# Patient Record
Sex: Male | Born: 1940
Health system: Southern US, Community
[De-identification: ages and names within clinical notes are randomized; demographics above are authoritative.]

## PROBLEM LIST (undated history)

## (undated) DIAGNOSIS — I10 Essential (primary) hypertension: Secondary | ICD-10-CM

## (undated) DIAGNOSIS — E119 Type 2 diabetes mellitus without complications: Secondary | ICD-10-CM

## (undated) DIAGNOSIS — J45909 Unspecified asthma, uncomplicated: Secondary | ICD-10-CM

## (undated) DIAGNOSIS — I519 Heart disease, unspecified: Secondary | ICD-10-CM

## (undated) DIAGNOSIS — I639 Cerebral infarction, unspecified: Secondary | ICD-10-CM

## (undated) DIAGNOSIS — C61 Malignant neoplasm of prostate: Secondary | ICD-10-CM

## (undated) DIAGNOSIS — K921 Melena: Secondary | ICD-10-CM

## (undated) DIAGNOSIS — M1990 Unspecified osteoarthritis, unspecified site: Secondary | ICD-10-CM

## (undated) HISTORY — DX: Melena: K92.1

## (undated) HISTORY — DX: Essential (primary) hypertension: I10

## (undated) HISTORY — DX: Unspecified asthma, uncomplicated: J45.909

## (undated) HISTORY — DX: Unspecified osteoarthritis, unspecified site: M19.90

## (undated) HISTORY — DX: Heart disease, unspecified: I51.9

## (undated) HISTORY — PX: PROSTATE BIOPSY: SHX241

## (undated) HISTORY — DX: Malignant neoplasm of prostate: C61

## (undated) HISTORY — PX: CORONARY ANGIOPLASTY WITH STENT PLACEMENT: SHX49

## (undated) HISTORY — DX: Cerebral infarction, unspecified: I63.9

## (undated) HISTORY — PX: GANGLION CYST EXCISION: SHX1691

---

## 2019-01-09 ENCOUNTER — Other Ambulatory Visit: Payer: Self-pay | Admitting: Urology

## 2019-01-09 DIAGNOSIS — C61 Malignant neoplasm of prostate: Secondary | ICD-10-CM

## 2019-01-15 ENCOUNTER — Encounter (HOSPITAL_COMMUNITY)
Admission: RE | Admit: 2019-01-15 | Discharge: 2019-01-15 | Disposition: A | Discharge status: Home / Self Care | Payer: Medicare HMO | Source: Ambulatory Visit | Attending: Urology | Admitting: Urology

## 2019-01-15 ENCOUNTER — Ambulatory Visit (HOSPITAL_COMMUNITY)
Admission: RE | Admit: 2019-01-15 | Discharge: 2019-01-15 | Disposition: A | Discharge status: Home / Self Care | Payer: Medicare HMO | Source: Ambulatory Visit | Attending: Urology | Admitting: Urology

## 2019-01-15 ENCOUNTER — Encounter (HOSPITAL_COMMUNITY): Payer: Self-pay

## 2019-01-15 ENCOUNTER — Other Ambulatory Visit: Payer: Self-pay

## 2019-01-15 DIAGNOSIS — C61 Malignant neoplasm of prostate: Secondary | ICD-10-CM

## 2019-01-15 LAB — POCT I-STAT CREATININE: Creatinine, Ser: 1.5 mg/dL — ABNORMAL HIGH (ref 0.61–1.24)

## 2019-01-15 MED ORDER — TECHNETIUM TC 99M MEDRONATE IV KIT
21.8000 | PACK | Freq: Once | INTRAVENOUS | Status: AC | PRN
  Administered 2019-01-15: 11:00:00 21.8 via INTRAVENOUS

## 2019-01-15 MED ORDER — IOHEXOL 300 MG/ML  SOLN
100.0000 mL | Freq: Once | INTRAMUSCULAR | Status: AC | PRN
  Administered 2019-01-15: 100 mL via INTRAVENOUS

## 2019-01-24 ENCOUNTER — Ambulatory Visit (INDEPENDENT_AMBULATORY_CARE_PROVIDER_SITE_OTHER): Payer: Medicare HMO | Admitting: Family Medicine

## 2019-01-24 ENCOUNTER — Other Ambulatory Visit: Payer: Self-pay

## 2019-01-24 ENCOUNTER — Encounter: Payer: Self-pay | Admitting: Family Medicine

## 2019-01-24 DIAGNOSIS — Z0000 Encounter for general adult medical examination without abnormal findings: Secondary | ICD-10-CM | POA: Diagnosis not present

## 2019-01-24 DIAGNOSIS — I10 Essential (primary) hypertension: Secondary | ICD-10-CM | POA: Diagnosis not present

## 2019-01-24 DIAGNOSIS — G459 Transient cerebral ischemic attack, unspecified: Secondary | ICD-10-CM

## 2019-01-24 DIAGNOSIS — H3530 Unspecified macular degeneration: Secondary | ICD-10-CM

## 2019-01-24 DIAGNOSIS — I2510 Atherosclerotic heart disease of native coronary artery without angina pectoris: Secondary | ICD-10-CM | POA: Diagnosis not present

## 2019-01-24 DIAGNOSIS — E1121 Type 2 diabetes mellitus with diabetic nephropathy: Secondary | ICD-10-CM | POA: Insufficient documentation

## 2019-01-24 DIAGNOSIS — C61 Malignant neoplasm of prostate: Secondary | ICD-10-CM | POA: Diagnosis not present

## 2019-01-24 DIAGNOSIS — R9721 Rising PSA following treatment for malignant neoplasm of prostate: Secondary | ICD-10-CM | POA: Insufficient documentation

## 2019-01-24 DIAGNOSIS — E119 Type 2 diabetes mellitus without complications: Secondary | ICD-10-CM | POA: Diagnosis not present

## 2019-01-24 LAB — COMPREHENSIVE METABOLIC PANEL
ALT: 13 U/L (ref 0–53)
AST: 17 U/L (ref 0–37)
Albumin: 4.3 g/dL (ref 3.5–5.2)
Alkaline Phosphatase: 71 U/L (ref 39–117)
BUN: 26 mg/dL — ABNORMAL HIGH (ref 6–23)
CO2: 34 mEq/L — ABNORMAL HIGH (ref 19–32)
Calcium: 10 mg/dL (ref 8.4–10.5)
Chloride: 98 mEq/L (ref 96–112)
Creatinine, Ser: 1.34 mg/dL (ref 0.40–1.50)
GFR: 51.49 mL/min — ABNORMAL LOW (ref >60.00)
Glucose, Bld: 114 mg/dL — ABNORMAL HIGH (ref 70–99)
Potassium: 3.2 mEq/L — ABNORMAL LOW (ref 3.5–5.1)
Sodium: 140 mEq/L (ref 135–145)
Total Bilirubin: 0.4 mg/dL (ref 0.2–1.2)
Total Protein: 7.2 g/dL (ref 6.0–8.3)

## 2019-01-24 LAB — HEMOGLOBIN A1C: Hgb A1c MFr Bld: 7.4 % — ABNORMAL HIGH (ref 4.6–6.5)

## 2019-01-24 MED ORDER — EZETIMIBE 10 MG PO TABS: 10.0000 mg | ORAL_TABLET | Freq: Every day | ORAL | 3 refills | Status: DC

## 2019-01-24 MED ORDER — IOHEXOL 300 MG/ML  SOLN
100.0000 mL | Freq: Once | INTRAMUSCULAR | Status: AC | PRN
  Administered 2019-01-24: 100 mL via INTRAVENOUS

## 2019-01-24 NOTE — Progress Notes (Signed)
Phone: [phone]  Subjective:  Patient presents today for their  Medicare Exam and to establish care with chronic health issues.   1) HTN: Hypertension: Here for follow up of hypertension.  Currently on procardia 90mg  and hctz 25mg .  Takes medication as prescribed and denies any side effects. Exercise includes working outside. Weight has been stable. Denies any chest pain, headaches, shortness of breath, vision changes, swelling in lower extremities.   2) hx of TIA: states he has had 2 TIA and was put on plavix. Can not tolerate statins.   3) CAD s/p 2 stents: currently on CCB and plavix. Never been on Beta blocker and can not tolerate statins. No longer being seen by cardiology and was released years ago he states. He doesn't think he has any family history of heart disease. Father did die of CVA. 2 younger brothers. One died of SCD at age 35 years.   4) type 2 diabetes: was diagnosed in 2019. Last a1c was 7.1 in May with old PCP. He stopped his metformin on his own and changed his diet. No family hx of diabetes. He has his fasting and post prandial blood sugars with  Him and states since he changed his diet his sugars have been going down. Fasting blood sugars: 130-166. Post prandial: 87-115. No hypoglycemic events.   Preventive Screening-Counseling & Management  Advanced directives: yes  Smoking Status: smoked in high school Second Hand Smoking status: No smokers in home  Risk Factors Regular exercise: Walking, very active Diet: well balanced  Fall Risk: None  Fall Risk  01/24/2019  Falls in the past year? 0  Number falls in past yr: 0  Injury with Fall? 0   Opioid use history:  no long term opioids use  Cardiac risk factors:  advanced age (older than 6 for men, 12 for women) yes Hyperlipidemia yes  diabetes. yes Family History: mother (high cholesterol)   Depression Screen None. [place] 0    Office Visit from 01/24/2019 in [place]  PHQ-2 Total  Score  0       Office Visit from 01/24/2019 in [place]  PHQ-2 Total Score  0      Activities of Daily Living Independent ADLs and IADLs   Hearing Difficulties: -patient declines  Cognitive Testing Mini-cog: 5/5 No reported trouble.    Normal 3 word recall  List the Names of Other Physician/Practitioners you currently use: -Dr. Cammy Copa is his urologist at [facility] in [place], but has appointment with [facility] (Dr. Alinda Money)  Opthalmologist: Dr. Gloris Ham at [facility] [place].     There is no immunization history on file for this patient. Required Immunizations needed today   Tdap: 2018 Pneumonia shots: declines Eye exam: utd. Will continue with following with his eye doc in [place].  Cologaurd: 2019.   Screening tests- up to date Health Maintenance Due  Topic Date Due  . HEMOGLOBIN A1C  10-Jul-1941  . FOOT EXAM  07/31/1950  . OPHTHALMOLOGY EXAM  [birth year]/31/1950  . URINE MICROALBUMIN  07/31/1950  . TETANUS/TDAP  08/01/1959  . PNA vac Low Risk Adult (1 of 2 - PCV13) 07/31/2005   Review of Systems  Constitutional: Negative for chills, fever and malaise/fatigue.  HENT: Negative for hearing loss and sore throat.   Eyes: Negative for blurred vision and double vision.  Respiratory: Negative for cough, shortness of breath and wheezing.   Cardiovascular: Negative for chest pain, palpitations and leg swelling.  Gastrointestinal: Negative for abdominal pain,  blood in stool, nausea and vomiting.  Genitourinary: Negative for dysuria and hematuria.  Musculoskeletal: Negative for falls.  Skin: Negative for rash.  Neurological: Negative for dizziness and weakness.  Psychiatric/Behavioral: Negative for memory loss and suicidal ideas. The patient is not nervous/anxious and does not have insomnia.      The following were reviewed and entered/updated in epic: History reviewed. No pertinent past medical history. Patient Active Problem  List   Diagnosis Date Noted  . Hypertension, essential 01/24/2019  . TIA (transient ischemic attack) 01/24/2019  . CAD (coronary artery disease) 01/24/2019  . Prostate cancer ([place]) 01/24/2019  . Controlled type 2 diabetes mellitus without complication, without long-term current use of insulin ([place]) 01/24/2019  . Macular degeneration 01/24/2019   History reviewed. No pertinent surgical history.  History reviewed. No pertinent family history.  Medications- reviewed and updated Current Outpatient Medications  Medication Sig Dispense Refill  . clopidogrel (PLAVIX) 75 MG tablet Take by mouth.    . Coenzyme Q10 (COQ10) 200 MG CAPS Take by mouth.    . hydrochlorothiazide (HYDRODIURIL) 25 MG tablet Take by mouth.    . Melatonin 5 MG CAPS Take by mouth.    Marland Kitchen NIFEdipine (PROCARDIA XL/NIFEDICAL-XL) 90 MG 24 hr tablet     . potassium chloride (K-DUR) 10 MEQ tablet Take by mouth.    . saw palmetto 160 MG capsule Take 160 mg by mouth 2 (two) times daily.    Marland Kitchen ezetimibe (ZETIA) 10 MG tablet Take 1 tablet (10 mg total) by mouth daily. 90 tablet 3   No current facility-administered medications for this visit.     Allergies-reviewed and updated Allergies  Allergen Reactions  . Statins Other (See Comments)    Causes narcolepsy    Social History   Socioeconomic History  . Marital status: Married    Spouse name: Not on file  . Number of children: Not on file  . Years of education: Not on file  . Highest education level: Not on file  Occupational History  . Not on file  Social Needs  . Financial resource strain: Not on file  . Food insecurity:    Worry: Not on file    Inability: Not on file  . Transportation needs:    Medical: Not on file    Non-medical: Not on file  Tobacco Use  . Smoking status: Former Research scientist (life sciences)  . Smokeless tobacco: Never Used  Substance and Sexual Activity  . Alcohol use: Not on file  . Drug use: Not on file  . Sexual activity: Not on file  Lifestyle  .  Physical activity:    Days per week: Not on file    Minutes per session: Not on file  . Stress: Not on file  Relationships  . Social connections:    Talks on phone: Not on file    Gets together: Not on file    Attends religious service: Not on file    Active member of club or organization: Not on file    Attends meetings of clubs or organizations: Not on file    Relationship status: Not on file  Other Topics Concern  . Not on file  Social History Narrative  . Not on file    Objective: BP 136/84 (BP Location: Left Arm, Patient Position: Sitting, Cuff Size: Normal)   Pulse 72   Temp 97.6 F (36.4 C) (Oral)   Ht 5\' 10"  (1.778 m)   Wt 165 lb 9.6 oz (75.1 kg)   SpO2 95%  BMI 23.76 kg/m  Gen: NAD, resting comfortably HEENT: Mucous membranes are moist. Oropharynx normal Neck: no thyromegaly CV: RRR no murmurs rubs or gallops Lungs: CTAB no crackles, wheeze, rhonchi Abdomen: soft/nontender/nondistended/normal bowel sounds. No rebound or guarding.  Ext: no edema Skin: warm, dry Neuro: grossly normal, moves all extremities, PERRLA  Assessment/Plan:  Medicare exam completed- discussed recommended screenings anddocumented any personalized health advice and referrals for preventive counseling. See AVS as well which was given to patient. Requesting records from his previous PCP/labs/HM. He states he is up to date with his tdap, declines pnuemonia shots and had cologaurd last year. Reviewed labs with him from March of this year.   Status of chronic or acute concerns  1. Hypertension, essential Blood pressure is to goal. Continue current anti-hypertensive medications. Refills not needed.  lab work reviewed. Recommended routine exercise and healthy diet including DASH diet and mediterranean diet. Encouraged weight loss. F/u in 6 months. Will need all labs at that time.    2. TIA (transient ischemic attack) Continue plavix. Can not tolerate statins.   3. Coronary artery disease  involving native coronary artery of native heart without angina pectoris Requesting records. ? Why never put on beta blocker. Can not tolerate statins, so will do trial of zetia. His LDL is 167 and would like to see this closer to goal with diabetes, HTN, CAD, TIA. Discussed drug and how to take and side effects. Check liver enzymes at next visit. Continue plavix.   4. Prostate cancer ([place]) CT and bone scan with no abnormal activity. Did discuss with him, but will need to follow up with urologist for elevated PSA. This was over 13 in march. Sees urology here in a few days.   5. Controlled type 2 diabetes mellitus without complication, without long-term current use of insulin ([place]) Unsure why he stopped his metformin. Checking renal function and A1c. Sugar log is pretty well controlled for age. Discussed goal for him at near 79 years. I think he can likely do with diet. He declined nutrition consult and handouts/information was given to help with diabetic diet/information. Will f/u on A1C. Did discuss if high will have him see Sam for nutrition consult and see if medication needed. See him back in 3-6 months with labs.  - Hemoglobin A1c - Comprehensive metabolic panel  6. Macular degeneration of right eye, unspecified type Continue to follow with [place] doctor.     No future appointments. Return in about 6 months (around 07/27/2019) for routine f/u with labs.   Lab/Order associations: Hypertension, essential  TIA (transient ischemic attack)  Coronary artery disease involving native coronary artery of native heart without angina pectoris  Prostate cancer ([place])  Controlled type 2 diabetes mellitus without complication, without long-term current use of insulin ([place]) - Plan: Hemoglobin A1c, Comprehensive metabolic panel  Macular degeneration of right eye, unspecified type  Meds ordered this encounter  Medications  . ezetimibe (ZETIA) 10 MG tablet    Sig: Take 1 tablet (10 mg total) by  mouth daily.    Dispense:  90 tablet    Refill:  3    Return precautions advised. [name], MD

## 2019-01-24 NOTE — Patient Instructions (Addendum)
1) cholesterol: I sent in medication called zetia to take once/day. It's not a statin, but with your hx of TIA, stents and diabetes we want to get your LDL cholesterol down to decrease risk of heart event or stroke.   2) diabetes: checking A1c today since you stopped metformin. Recommend looking at American diabetic website for good food information.   3) blood pressure is good.   -see you in 6 months! So nice to meet you

## 2019-01-31 ENCOUNTER — Other Ambulatory Visit: Payer: Self-pay | Admitting: Family Medicine

## 2019-01-31 MED ORDER — CLOPIDOGREL BISULFATE 75 MG PO TABS: 75.0000 mg | ORAL_TABLET | Freq: Every day | ORAL | 1 refills | Status: DC

## 2019-01-31 MED ORDER — HYDROCHLOROTHIAZIDE 25 MG PO TABS: 25.0000 mg | ORAL_TABLET | Freq: Every day | ORAL | 1 refills | Status: DC

## 2019-01-31 MED ORDER — NIFEDIPINE ER OSMOTIC RELEASE 90 MG PO TB24: 90.0000 mg | ORAL_TABLET | Freq: Every day | ORAL | 1 refills | Status: DC

## 2019-01-31 MED ORDER — POTASSIUM CHLORIDE CRYS ER 10 MEQ PO TBCR: 10.0000 meq | EXTENDED_RELEASE_TABLET | Freq: Three times a day (TID) | ORAL | 1 refills | Status: DC

## 2019-01-31 NOTE — Progress Notes (Signed)
Spoke to patient and advised all rx refills sent in to pharmacy.  He verbalized understanding

## 2019-01-31 NOTE — Progress Notes (Signed)
Please let him know that I sent in all requested medication to his mail order pharmacy with 90 day supply.  Thanks for the letter  With request! Dr. Rogers Blocker

## 2019-02-07 ENCOUNTER — Encounter: Payer: Self-pay | Admitting: Physician Assistant

## 2019-02-07 ENCOUNTER — Ambulatory Visit (INDEPENDENT_AMBULATORY_CARE_PROVIDER_SITE_OTHER): Payer: Medicare HMO | Admitting: Physician Assistant

## 2019-02-07 DIAGNOSIS — Z713 Dietary counseling and surveillance: Secondary | ICD-10-CM | POA: Diagnosis not present

## 2019-02-07 DIAGNOSIS — E119 Type 2 diabetes mellitus without complications: Secondary | ICD-10-CM | POA: Diagnosis not present

## 2019-02-07 NOTE — Patient Instructions (Signed)
It was great to see you!  Here our the key points from our discussion today:  1. Measure out the honey nut cheerios and eat no more than one cup daily. Add additional protein for your breakfast (see handout for ideas.) Choose either a breakfast sandwich OR cereal.  2. Eat a good portion of NON-starchy veggies (all veggies except potatoes/corn/peas) with lunch and dinner. Fill up on this first, then work on protein. Carbohydrates last.  3. Carbohydrates (pasta/rice/pototoes/etc) -- keep the portion to the size of the computer mouse.  4. For the afternoon grazing, please work on good source of protein. See snack list for ideas.    Take care,  Inda Coke PA-C

## 2019-02-07 NOTE — Progress Notes (Signed)
Virtual Visit via Video   I connected with Wayne Perkins on 02/07/19 at 10:40 AM EDT by a video enabled telemedicine application and verified that I am speaking with the correct person using two identifiers. Location patient: Home Location provider: [facility], Office Persons participating in the virtual visit: Noelle, [place] PA-C, Anselmo Pickler, LPN   I discussed the limitations of evaluation and management by telemedicine and the availability of in person appointments. The patient expressed understanding and agreed to proceed.  I acted as a Education administrator for [employer], [employer], LPN  Subjective:   HPI:  Nutrition Counseling Pt would like to discuss nutrition and diet and healthy eating. He is diabetic and has been on medications as recently as 2-3 years ago, was on metformin. He is currently not on any medications.  Lab Results  Component Value Date   HGBA1C 7.4 (H) 01/24/2019   Dietary recall: Wakes up at [place] -- around 7-8a -- 2 cups honey nut cheerios, [employer] toast, blueberries, 2% milk Lunch -- around noon -- PB sandwich or ham/cheese with tortilla chips Dinner -- around 630p -- soups (chili/veggie) with sandwich Snacks -- nuts/chips/pretzels (grazing throughout mid afternoon) Beverages -- hot tea without sweetener, water  Takeout -- gets sprite, small frosty and Wendy's jr bacon cheeseburger twice weekly  Weight: Wt Readings from Last 3 Encounters:  01/24/19 165 lb 9.6 oz (75.1 kg)    Exercise: Works a lot around [employer] system: wife  Goals: 1- avoid DM medications  Estimated daily energy needs: Calories: 1500-1750 kcal Protein: 60-75 g Fluid: at least 1800 ml   ROS: See pertinent positives and negatives per HPI.  Patient Active Problem List   Diagnosis Date Noted  . Hypertension, essential 01/24/2019  . TIA (transient ischemic attack) 01/24/2019  . CAD (coronary artery disease) 01/24/2019  . Prostate  cancer ([place]) 01/24/2019  . Controlled type 2 diabetes mellitus without complication, without long-term current use of insulin ([place]) 01/24/2019  . Macular degeneration 01/24/2019    Social History   Tobacco Use  . Smoking status: Former Research scientist (life sciences)  . Smokeless tobacco: Never Used  Substance Use Topics  . Alcohol use: Not on file    Current Outpatient Medications:  .  aspirin EC 81 MG tablet, Take 81 mg by mouth daily., Disp: , Rfl:  .  clopidogrel (PLAVIX) 75 MG tablet, Take 1 tablet (75 mg total) by mouth daily., Disp: 90 tablet, Rfl: 1 .  ezetimibe (ZETIA) 10 MG tablet, Take 1 tablet (10 mg total) by mouth daily., Disp: 90 tablet, Rfl: 3 .  hydrochlorothiazide (HYDRODIURIL) 25 MG tablet, Take 1 tablet (25 mg total) by mouth daily., Disp: 90 tablet, Rfl: 1 .  Melatonin 5 MG CAPS, Take by mouth., Disp: , Rfl:  .  Multiple Vitamins-Minerals (PRESERVISION AREDS) CAPS, Take 1 capsule by mouth daily., Disp: , Rfl:  .  NIFEdipine (PROCARDIA XL/NIFEDICAL-XL) 90 MG 24 hr tablet, Take 1 tablet (90 mg total) by mouth daily., Disp: 90 tablet, Rfl: 1 .  potassium chloride (K-DUR) 10 MEQ tablet, Take 1 tablet (10 mEq total) by mouth 3 (three) times daily. (Patient taking differently: Take 10 mEq by mouth 2 (two) times daily. ), Disp: 270 tablet, Rfl: 1 .  saw palmetto 160 MG capsule, Take 160 mg by mouth daily. , Disp: , Rfl:   Allergies  Allergen Reactions  . Statins Other (See Comments)    Causes narcolepsy    Objective:   VITALS: Per  patient if applicable, see vitals. GENERAL: Alert, appears well and in no acute distress. HEENT: Atraumatic, conjunctiva clear, no obvious abnormalities on inspection of external nose and ears. NECK: Normal movements of the head and neck. CARDIOPULMONARY: No increased WOB. Speaking in clear sentences. I:E ratio WNL.  MS: Moves all visible extremities without noticeable abnormality. PSYCH: Pleasant and cooperative, well-groomed. Speech normal rate and rhythm.  Affect is appropriate. Insight and judgement are appropriate. Attention is focused, linear, and appropriate.  NEURO: CN grossly intact. Oriented as arrived to appointment on time with no prompting. Moves both UE equally.  SKIN: No obvious lesions, wounds, erythema, or cyanosis noted on face or hands.  Assessment and Plan:   Zacary was seen today for nutrition counseling.  Diagnoses and all orders for this visit:  Encounter for nutritional counseling; Controlled type 2 diabetes mellitus without complication, without long-term current use of insulin (H[facility]) Discussed specific, individualized recommendations regarding nutrition including decreasing sugary beverages, limiting portions, balancing out meals, and eating regularly throughout the day. Handouts provided included: Balanced Plate, Balanced Snack List, Balanced Breakfast. Provided emotional support and encouraged slow, steady weight loss. Patient's questions answered throughout encounter. Follow-up with me prn.  Specific goals: 1. Measure out the honey nut cheerios and eat no more than one cup daily. Add additional protein for your breakfast (see handout for ideas.) Choose either a breakfast sandwich OR cereal.  2. Eat a good portion of NON-starchy veggies (all veggies except potatoes/corn/peas) with lunch and dinner. Fill up on this first, then work on protein. Carbohydrates last.  3. Carbohydrates (pasta/rice/pototoes/etc) -- keep the portion to the size of the computer mouse.  4. For the afternoon grazing, please work on good source of protein. See snack list for ideas.    . Reviewed expectations re: course of current medical issues. . Discussed self-management of symptoms. . Outlined signs and symptoms indicating need for more acute intervention. . Patient verbalized understanding and all questions were answered. Marland Kitchen Health Maintenance issues including appropriate healthy diet, exercise, and smoking avoidance were discussed with  patient. . See orders for this visit as documented in the electronic medical record.  I discussed the assessment and treatment plan with the patient. The patient was provided an opportunity to ask questions and all were answered. The patient agreed with the plan and demonstrated an understanding of the instructions.   The patient was advised to call back or seek an in-person evaluation if the symptoms worsen or if the condition fails to improve as anticipated.   I spent 25 minutes with this patient, greater than 50% was face-to-face time counseling regarding the above diagnoses.  [place], [place] 02/07/2019

## 2019-04-06 LAB — URINALYSIS, COMPLETE

## 2019-04-06 LAB — NM BONE SCAN WHOLE BODY

## 2019-04-06 LAB — CHG CT ABDOMEN & PELVIS W/O CONTRST 1/> BODY RE

## 2019-04-19 ENCOUNTER — Encounter: Payer: Self-pay | Admitting: Family Medicine

## 2019-04-19 ENCOUNTER — Other Ambulatory Visit: Payer: Self-pay | Admitting: Family Medicine

## 2019-04-19 ENCOUNTER — Other Ambulatory Visit: Payer: Self-pay

## 2019-04-19 DIAGNOSIS — R9721 Rising PSA following treatment for malignant neoplasm of prostate: Secondary | ICD-10-CM

## 2019-04-20 ENCOUNTER — Telehealth: Payer: Self-pay

## 2019-04-20 NOTE — Telephone Encounter (Signed)
Spoke to patient and advised this was an error and he does not need to go for this test.  He just had one in April (per patient) Advised he may need to contact his urologist to f/u with them, we will contact radiology on our end to cancel appt.  He verbalized understanding.

## 2019-04-20 NOTE — Telephone Encounter (Signed)
Does not need. Unsure why this was ordered. Ask urology when needed.  [name], MD [place]

## 2019-04-20 NOTE — Telephone Encounter (Signed)
Copied from [place] [id number]. Topic: Quick Sport and exercise psychologist Patient (Clinic Use ONLY) >> Apr 20, 2019  1:36 PM [place], [place] wrote: Reason for CRM: Called to give him appt details for his whole body scan.  Scan will be at [facility] - just tell him to go in the main entrance and they can give him directions to radiology. He needs to be there at 10:00 for an injection and then come back for the scan at 1:00.  Nothing to eat or drink after midnight - no sugar or carbs 8 hrs prior to the scan ( he will be fasting anyways )  If he has questions or needs to reschedule, he can call [phone] >> Apr 20, 2019  3:06 PM Sheppard Coil, [employer] L wrote: Pt calling back.  He wants to know why he is being set up for this scan.

## 2019-04-30 ENCOUNTER — Encounter (HOSPITAL_COMMUNITY): Payer: Medicare HMO

## 2019-04-30 ENCOUNTER — Ambulatory Visit (HOSPITAL_COMMUNITY): Payer: Medicare HMO

## 2019-05-11 ENCOUNTER — Other Ambulatory Visit: Payer: Self-pay

## 2019-05-14 ENCOUNTER — Ambulatory Visit (INDEPENDENT_AMBULATORY_CARE_PROVIDER_SITE_OTHER): Payer: Medicare HMO | Admitting: Family Medicine

## 2019-05-14 ENCOUNTER — Encounter: Payer: Self-pay | Admitting: Family Medicine

## 2019-05-14 ENCOUNTER — Other Ambulatory Visit: Payer: Self-pay

## 2019-05-14 VITALS — BP 144/88 | HR 80 | Temp 97.6°F | Ht 70.0 in | Wt 170.6 lb

## 2019-05-14 DIAGNOSIS — R9721 Rising PSA following treatment for malignant neoplasm of prostate: Secondary | ICD-10-CM

## 2019-05-14 DIAGNOSIS — I2510 Atherosclerotic heart disease of native coronary artery without angina pectoris: Secondary | ICD-10-CM | POA: Diagnosis not present

## 2019-05-14 DIAGNOSIS — E119 Type 2 diabetes mellitus without complications: Secondary | ICD-10-CM | POA: Diagnosis not present

## 2019-05-14 DIAGNOSIS — I10 Essential (primary) hypertension: Secondary | ICD-10-CM | POA: Diagnosis not present

## 2019-05-14 DIAGNOSIS — C61 Malignant neoplasm of prostate: Secondary | ICD-10-CM

## 2019-05-14 LAB — COMPREHENSIVE METABOLIC PANEL
ALT: 16 U/L (ref 0–53)
AST: 19 U/L (ref 0–37)
Albumin: 4.4 g/dL (ref 3.5–5.2)
Alkaline Phosphatase: 70 U/L (ref 39–117)
BUN: 36 mg/dL — ABNORMAL HIGH (ref 6–23)
CO2: 33 mEq/L — ABNORMAL HIGH (ref 19–32)
Calcium: 10.4 mg/dL (ref 8.4–10.5)
Chloride: 97 mEq/L (ref 96–112)
Creatinine, Ser: 1.4 mg/dL (ref 0.40–1.50)
GFR: 48.91 mL/min — ABNORMAL LOW (ref >60.00)
Glucose, Bld: 170 mg/dL — ABNORMAL HIGH (ref 70–99)
Potassium: 3.3 mEq/L — ABNORMAL LOW (ref 3.5–5.1)
Sodium: 140 mEq/L (ref 135–145)
Total Bilirubin: 0.5 mg/dL (ref 0.2–1.2)
Total Protein: 7 g/dL (ref 6.0–8.3)

## 2019-05-14 LAB — PSA: PSA: 2.02 ng/mL (ref 0.10–4.00)

## 2019-05-14 LAB — LIPID PANEL
Cholesterol: 214 mg/dL — ABNORMAL HIGH (ref 0–200)
HDL: 66.7 mg/dL (ref >39.00)
LDL Cholesterol: 116 mg/dL — ABNORMAL HIGH (ref 0–99)
NonHDL: 147.36
Total CHOL/HDL Ratio: 3
Triglycerides: 157 mg/dL — ABNORMAL HIGH (ref 0.0–149.0)
VLDL: 31.4 mg/dL (ref 0.0–40.0)

## 2019-05-14 LAB — MICROALBUMIN / CREATININE URINE RATIO
Creatinine,U: 35.7 mg/dL
Microalb Creat Ratio: 3.6 mg/g (ref 0.0–30.0)
Microalb, Ur: 1.3 mg/dL (ref 0.0–1.9)

## 2019-05-14 LAB — HEMOGLOBIN A1C: Hgb A1c MFr Bld: 8.1 % — ABNORMAL HIGH (ref 4.6–6.5)

## 2019-05-14 NOTE — Progress Notes (Signed)
Patient: Wayne Perkins MRN: [id number] DOB: 1940-11-11 PCP: [name], MD     Subjective:  Chief Complaint  Patient presents with  . follow up-labs  . Diabetes    HPI: The patient is a 78 y.o. male who presents today for follow up on labs from 01/24/19.  Started on zetia on 5/27 also.  Diabetes: Patient is here for follow up of type 2 diabetes. First diagnosed 2019.  Currently on the following medications none. He is currently on no medication for diabetes. He stopped this on his own accord. His last A1C was 7.4.  Currently exercising and following diabetic diet. No hypoglycemia. Denies any hypoglycemic events. Denies any vision changes, nausea, vomiting, abdominal pain, ulcers/paraesthesia in feet, polyuria, polydipsia or polyphagia. Denies any chest pain, shortness of breath. I did have him meet with nutritionist and he has implemented some of her dietary recommendations.   CAD/heart disease and TIA: we started zetia on him at last visit. He can not tolerate statins. Still unsure why not on beta blocker and may add this if BP elevated on next visit for cardio protection. Requesting records again. Tolerating zetia with no issues and taking as directed.   He would like his psa checked as well. Has f/u with urology in October.    Review of Systems  Constitutional: Negative for fatigue.  HENT: Negative for congestion, postnasal drip, rhinorrhea and sore throat.   Eyes: Negative for visual disturbance.  Respiratory: Negative for cough and shortness of breath.   Cardiovascular: Negative for chest pain, palpitations and leg swelling.  Gastrointestinal: Negative for abdominal pain, diarrhea, nausea and vomiting.  Endocrine: Negative for polydipsia and polyuria.  Musculoskeletal: Positive for neck pain. Negative for arthralgias, back pain and myalgias.  Skin: Negative for rash.  Neurological: Negative for dizziness and headaches.  Psychiatric/Behavioral: Negative for sleep disturbance.     Allergies Patient is allergic to statins.  Past Medical History Patient  has a past medical history of Arthritis, Blood in stool, Childhood asthma, Heart disease, Hypertension, essential, Prostate cancer ([place]), and Stroke ([place]).  Surgical History Patient  has no past surgical history on file.  Family History [name]'s family history includes Depression in his brother; Early death in his brother; Heart disease in his brother; Hyperlipidemia in his mother and sister; Hypertension in his mother; Mental illness in his brother; Stroke in his father.  Social History Patient  reports that he has quit smoking. He has never used smokeless tobacco. He reports previous alcohol use. He reports that he does not use drugs.    Objective: Vitals:   05/14/19 1035 05/14/19 1111  BP: (!) 144/85 (!) 144/88  Pulse: 80   Temp: 97.6 F (36.4 C)   TempSrc: Skin   SpO2: 98%   Weight: 170 lb 9.6 oz (77.4 kg)   Height: 5\' 10"  (1.778 m)     Body mass index is 24.48 kg/m.  Physical Exam Vitals signs reviewed.  Constitutional:      Appearance: Normal appearance. He is normal weight.  HENT:     Head: Normocephalic and atraumatic.  Eyes:     Extraocular Movements: Extraocular movements intact.     Conjunctiva/sclera: Conjunctivae normal.     Pupils: Pupils are equal, round, and reactive to light.  Cardiovascular:     Rate and Rhythm: Normal rate and regular rhythm.     Heart sounds: No murmur.  Pulmonary:     Effort: Pulmonary effort is normal.  Abdominal:     General: Abdomen is flat.  Bowel sounds are normal.     Palpations: Abdomen is soft.  Skin:    General: Skin is warm and dry.  Neurological:     General: No focal deficit present.     Mental Status: He is alert and oriented to person, place, and time.  Psychiatric:        Mood and Affect: Mood normal.        Behavior: Behavior normal.        Assessment/plan: 1. Controlled type 2 diabetes mellitus without complication, without  long-term current use of insulin ([place]) Has seen nutrition. Checking a1c today. Requesting his eye exam and again, requesting records from previous pcp. Declines flu and pneumonia shot. Needs foot exam at next appointment. Can not tolerate statin, so I have him on zetia that he is tolerating well. F/u in 3 months.  - Hemoglobin A1c - Microalbumin / creatinine urine ratio  2. Hypertension, essential Above goal on recheck. He has CAD with 2 stents. No beta blocker. Requesting records from cardiology. Beta blocker vs. acei would be good choice for him to get to goal. He states his home readings are to goal and will bring to next visit.   3. Biochemically recurrent malignant neoplasm of prostate Will mail results to have at his urologist.  - PSA  4. Coronary artery disease involving native coronary artery of native heart without angina pectoris Requesting records to be sent from cardiologist. On zetia and tolerating well. Checking lipid panel again today. Continue current therapy and maximize diabetic and blood pressure control.  - Comprehensive metabolic panel - Lipid panel   Return if symptoms worsen or fail to improve, for already has f/u in 3 months. [name], MD [place]   05/14/2019

## 2019-05-16 ENCOUNTER — Ambulatory Visit: Payer: Medicare HMO | Admitting: Family Medicine

## 2019-05-21 ENCOUNTER — Ambulatory Visit (INDEPENDENT_AMBULATORY_CARE_PROVIDER_SITE_OTHER): Payer: Medicare HMO | Admitting: Family Medicine

## 2019-05-21 ENCOUNTER — Encounter: Payer: Self-pay | Admitting: Family Medicine

## 2019-05-21 VITALS — BP 138/79 | Ht 70.0 in | Wt 170.0 lb

## 2019-05-21 DIAGNOSIS — E119 Type 2 diabetes mellitus without complications: Secondary | ICD-10-CM

## 2019-05-21 DIAGNOSIS — E876 Hypokalemia: Secondary | ICD-10-CM

## 2019-05-21 DIAGNOSIS — R799 Abnormal finding of blood chemistry, unspecified: Secondary | ICD-10-CM

## 2019-05-21 MED ORDER — JARDIANCE 25 MG PO TABS: 25.0000 mg | ORAL_TABLET | Freq: Every day | ORAL | 1 refills | Status: DC

## 2019-05-21 NOTE — Progress Notes (Signed)
Patient: Wayne Perkins MRN: [id number] DOB: 1941/07/11 PCP: [name], MD     I connected with Anson [place] on 05/21/19 at 9:45am by a video enabled telemedicine application and verified that I am speaking with the correct person using two identifiers.  Location patient: Home Location provider: [facility], Office Persons participating in this virtual visit: Anson [place] and Dr. Rogers Blocker   I discussed the limitations of evaluation and management by telemedicine and the availability of in person appointments. The patient expressed understanding and agreed to proceed.   Interactive audio and video telecommunications were attempted between this provider and patient, however failed, due to patient having technical difficulties OR patient did not have access to video capability.  We continued and completed visit with audio only.   Subjective:  Chief Complaint  Patient presents with  . discuss lab results  . Diabetes    HPI: The patient is a 78 y.o. male who presents today for treatment options based on recent lab results for his diabetes.   Diabetes: Patient is here for follow up of type 2 diabetes. First diagnosed 2019.  Currently on the following medications none. He stopped his metformin on his own accord. Last A1C was 8.1. Currently exercising and NOT following diabetic diet.  Denies any hypoglycemic events. Denies any vision changes, nausea, vomiting, abdominal pain, ulcers/paraesthesia in feet, polyuria, polydipsia or polyphagia. Denies any chest pain, shortness of breath. We are here to discuss medication and treatment plan to get his diabetes under control. Also his sugar could be worse as he is on a hormone shot every 6 months for his prostate cancer. He would rather have quality of life over quantity of life.   Hypokalemia: potassium is 3.3 and is chronically low. He is on potassium replacement. He takes 37meq BID.   Elevated BUN: slowly has creeped up. Does not use Nsaids on daily  basis. He is on baby aspirin daily and plavix. Put on DPT as he had TIA while on ASA. No dark stool, no blood in stool. Drinking lots of water per patient.   Review of Systems  Constitutional: Negative for chills, fatigue and fever.  HENT: Negative for congestion, postnasal drip, rhinorrhea and sore throat.   Eyes: Negative for visual disturbance.  Respiratory: Negative for cough and shortness of breath.   Cardiovascular: Negative for chest pain, palpitations and leg swelling.  Gastrointestinal: Negative for abdominal pain, diarrhea, nausea and vomiting.  Endocrine: Negative for polydipsia and polyuria.  Musculoskeletal: Negative for arthralgias, back pain, myalgias and neck pain.  Skin: Negative for rash.  Neurological: Negative for dizziness and headaches.  Psychiatric/Behavioral: Negative for sleep disturbance.    Allergies Patient is allergic to statins.  Past Medical History Patient  has a past medical history of Arthritis, Blood in stool, Childhood asthma, Heart disease, Hypertension, essential, Prostate cancer ([place]), and Stroke ([place]).  Surgical History Patient  has no past surgical history on file.  Family History [name]'s family history includes Depression in his brother; Early death in his brother; Heart disease in his brother; Hyperlipidemia in his mother and sister; Hypertension in his mother; Mental illness in his brother; Stroke in his father.  Social History Patient  reports that he has quit smoking. He has never used smokeless tobacco. He reports previous alcohol use. He reports that he does not use drugs.    Objective: Vitals:   05/21/19 0905  BP: 138/79  Weight: 170 lb (77.1 kg)  Height: 5\' 10"  (1.778 m)    Body mass index  is 24.39 kg/m.     Assessment/plan: 1. Controlled type 2 diabetes mellitus without complication, without long-term current use of insulin ([place]) Not controlled anymore. a1c up to 8.1. the hormone shot could be playing affect on this. He  again states his diet is much improved. Regardless, discussed this is way above goal for him especially with his heart history. I do understand that he does not want to take more medicaiton, but I have given him over  6 months to work on diet alone and it did not work. He is only increasing his risk of heart attack, stroke, renal damage etc with uncontrolled blood sugar. He is on board to start medication. We are going to do jardiance with heart hx. Do not want to do metformin at this point with hx of elevated creatinine in the past. GFR okay with jardiance, but will monitor closely. Again, discussed if comes off hormones we can see if his sugar adjusts and may be able to wean off medication, but at this point I feel like he really needs this. F/u in one month for labs and keep routine appointment in office in 3 months that he already has scheduled.   2. Hypokalemia Does not want to take 3 potassium pills. Again, discussed he would rather have quality of life over taking pills and being a "druggie." discussed his potassium is borderline low and can continue with 2 pills/day. Close f/u with repeat potassium in one month. Discussed if his number drops more we will need to start more replacement as low potassium can cause deadly arrhythmias.   3. Elevated BUN DPT, needs ASA. Drink water, denies black tarry stool. F/u with repeat GFR/renal check in one month especially with addition of jardiance.      Return in about 1 month (around 06/20/2019) for labs 1 month .   [name], MD [place]   05/21/2019

## 2019-06-25 ENCOUNTER — Other Ambulatory Visit (INDEPENDENT_AMBULATORY_CARE_PROVIDER_SITE_OTHER): Payer: Medicare HMO

## 2019-06-25 ENCOUNTER — Other Ambulatory Visit: Payer: Self-pay

## 2019-06-25 DIAGNOSIS — R799 Abnormal finding of blood chemistry, unspecified: Secondary | ICD-10-CM | POA: Diagnosis not present

## 2019-06-25 DIAGNOSIS — E876 Hypokalemia: Secondary | ICD-10-CM

## 2019-06-25 LAB — COMPREHENSIVE METABOLIC PANEL
ALT: 13 U/L (ref 0–53)
AST: 18 U/L (ref 0–37)
Albumin: 4.1 g/dL (ref 3.5–5.2)
Alkaline Phosphatase: 58 U/L (ref 39–117)
BUN: 34 mg/dL — ABNORMAL HIGH (ref 6–23)
CO2: 31 mEq/L (ref 19–32)
Calcium: 9.7 mg/dL (ref 8.4–10.5)
Chloride: 98 mEq/L (ref 96–112)
Creatinine, Ser: 1.43 mg/dL (ref 0.40–1.50)
GFR: 47.72 mL/min — ABNORMAL LOW (ref >60.00)
Glucose, Bld: 274 mg/dL — ABNORMAL HIGH (ref 70–99)
Potassium: 3.2 mEq/L — ABNORMAL LOW (ref 3.5–5.1)
Sodium: 139 mEq/L (ref 135–145)
Total Bilirubin: 0.6 mg/dL (ref 0.2–1.2)
Total Protein: 6.5 g/dL (ref 6.0–8.3)

## 2019-06-29 ENCOUNTER — Other Ambulatory Visit: Payer: Self-pay

## 2019-06-29 ENCOUNTER — Other Ambulatory Visit: Payer: Self-pay | Admitting: Family Medicine

## 2019-06-29 MED ORDER — POTASSIUM CHLORIDE CRYS ER 20 MEQ PO TBCR: 20.0000 meq | EXTENDED_RELEASE_TABLET | Freq: Two times a day (BID) | ORAL | 0 refills | Status: DC

## 2019-07-02 ENCOUNTER — Telehealth: Payer: Self-pay | Admitting: Family Medicine

## 2019-07-02 NOTE — Telephone Encounter (Signed)
Spoke to patient and advised of notes per Dr. Rogers Blocker.  He verbalized understanding.

## 2019-07-02 NOTE — Telephone Encounter (Signed)
Please have him take 1 full potassium pill in the AM (I increased dose to 39meq) and then half a tab in the Pm. This will put him at 52meq which is what he was originally supposed to be on. Then we can make sure im not giving him too much potassium at his next lab check.   He thought I was changing too much by increasing his potassium to this, so we can back down to 1 pill in the AM and half in the PM.   [name], MD [place]

## 2019-07-31 ENCOUNTER — Inpatient Hospital Stay (HOSPITAL_COMMUNITY)
Admission: EM | Admit: 2019-07-31 | Discharge: 2019-08-01 | DRG: 065 | Disposition: A | Discharge status: Home / Self Care | Payer: Medicare HMO | Attending: Internal Medicine | Admitting: Internal Medicine

## 2019-07-31 ENCOUNTER — Inpatient Hospital Stay (HOSPITAL_COMMUNITY): Payer: Medicare HMO

## 2019-07-31 ENCOUNTER — Encounter (HOSPITAL_COMMUNITY): Payer: Self-pay | Admitting: *Deleted

## 2019-07-31 ENCOUNTER — Emergency Department (HOSPITAL_COMMUNITY): Payer: Medicare HMO

## 2019-07-31 ENCOUNTER — Other Ambulatory Visit: Payer: Self-pay

## 2019-07-31 DIAGNOSIS — I6381 Other cerebral infarction due to occlusion or stenosis of small artery: Secondary | ICD-10-CM | POA: Diagnosis present

## 2019-07-31 DIAGNOSIS — Z8673 Personal history of transient ischemic attack (TIA), and cerebral infarction without residual deficits: Secondary | ICD-10-CM | POA: Diagnosis not present

## 2019-07-31 DIAGNOSIS — I639 Cerebral infarction, unspecified: Secondary | ICD-10-CM | POA: Diagnosis present

## 2019-07-31 DIAGNOSIS — I2510 Atherosclerotic heart disease of native coronary artery without angina pectoris: Secondary | ICD-10-CM | POA: Diagnosis present

## 2019-07-31 DIAGNOSIS — Z8349 Family history of other endocrine, nutritional and metabolic diseases: Secondary | ICD-10-CM

## 2019-07-31 DIAGNOSIS — E1121 Type 2 diabetes mellitus with diabetic nephropathy: Secondary | ICD-10-CM

## 2019-07-31 DIAGNOSIS — Z955 Presence of coronary angioplasty implant and graft: Secondary | ICD-10-CM | POA: Diagnosis not present

## 2019-07-31 DIAGNOSIS — Z823 Family history of stroke: Secondary | ICD-10-CM

## 2019-07-31 DIAGNOSIS — I129 Hypertensive chronic kidney disease with stage 1 through stage 4 chronic kidney disease, or unspecified chronic kidney disease: Secondary | ICD-10-CM | POA: Diagnosis present

## 2019-07-31 DIAGNOSIS — E876 Hypokalemia: Secondary | ICD-10-CM | POA: Diagnosis present

## 2019-07-31 DIAGNOSIS — Z7902 Long term (current) use of antithrombotics/antiplatelets: Secondary | ICD-10-CM

## 2019-07-31 DIAGNOSIS — R29701 NIHSS score 1: Secondary | ICD-10-CM | POA: Diagnosis present

## 2019-07-31 DIAGNOSIS — Z7982 Long term (current) use of aspirin: Secondary | ICD-10-CM

## 2019-07-31 DIAGNOSIS — N1831 Chronic kidney disease, stage 3a: Secondary | ICD-10-CM | POA: Diagnosis present

## 2019-07-31 DIAGNOSIS — E1122 Type 2 diabetes mellitus with diabetic chronic kidney disease: Secondary | ICD-10-CM | POA: Diagnosis present

## 2019-07-31 DIAGNOSIS — H3530 Unspecified macular degeneration: Secondary | ICD-10-CM | POA: Diagnosis present

## 2019-07-31 DIAGNOSIS — I361 Nonrheumatic tricuspid (valve) insufficiency: Secondary | ICD-10-CM | POA: Diagnosis not present

## 2019-07-31 DIAGNOSIS — I340 Nonrheumatic mitral (valve) insufficiency: Secondary | ICD-10-CM | POA: Diagnosis not present

## 2019-07-31 DIAGNOSIS — Z888 Allergy status to other drugs, medicaments and biological substances status: Secondary | ICD-10-CM | POA: Diagnosis not present

## 2019-07-31 DIAGNOSIS — E1165 Type 2 diabetes mellitus with hyperglycemia: Secondary | ICD-10-CM | POA: Diagnosis not present

## 2019-07-31 DIAGNOSIS — C7951 Secondary malignant neoplasm of bone: Secondary | ICD-10-CM | POA: Diagnosis present

## 2019-07-31 DIAGNOSIS — C61 Malignant neoplasm of prostate: Secondary | ICD-10-CM | POA: Diagnosis not present

## 2019-07-31 DIAGNOSIS — Z87891 Personal history of nicotine dependence: Secondary | ICD-10-CM

## 2019-07-31 DIAGNOSIS — Z818 Family history of other mental and behavioral disorders: Secondary | ICD-10-CM | POA: Diagnosis not present

## 2019-07-31 DIAGNOSIS — I10 Essential (primary) hypertension: Secondary | ICD-10-CM

## 2019-07-31 DIAGNOSIS — E785 Hyperlipidemia, unspecified: Secondary | ICD-10-CM | POA: Diagnosis present

## 2019-07-31 DIAGNOSIS — Z8249 Family history of ischemic heart disease and other diseases of the circulatory system: Secondary | ICD-10-CM | POA: Diagnosis not present

## 2019-07-31 DIAGNOSIS — Z20828 Contact with and (suspected) exposure to other viral communicable diseases: Secondary | ICD-10-CM | POA: Diagnosis present

## 2019-07-31 LAB — COMPREHENSIVE METABOLIC PANEL
ALT: 20 U/L (ref 0–44)
AST: 21 U/L (ref 15–41)
Albumin: 4.1 g/dL (ref 3.5–5.0)
Alkaline Phosphatase: 59 U/L (ref 38–126)
Anion gap: 10 (ref 5–15)
BUN: 40 mg/dL — ABNORMAL HIGH (ref 8–23)
CO2: 28 mmol/L (ref 22–32)
Calcium: 9.6 mg/dL (ref 8.9–10.3)
Chloride: 100 mmol/L (ref 98–111)
Creatinine, Ser: 1.38 mg/dL — ABNORMAL HIGH (ref 0.61–1.24)
GFR calc Af Amer: 56 mL/min — ABNORMAL LOW (ref >60)
GFR calc non Af Amer: 49 mL/min — ABNORMAL LOW (ref >60)
Glucose, Bld: 233 mg/dL — ABNORMAL HIGH (ref 70–99)
Potassium: 3 mmol/L — ABNORMAL LOW (ref 3.5–5.1)
Sodium: 138 mmol/L (ref 135–145)
Total Bilirubin: 0.4 mg/dL (ref 0.3–1.2)
Total Protein: 7.2 g/dL (ref 6.5–8.1)

## 2019-07-31 LAB — GLUCOSE, CAPILLARY
Glucose-Capillary: 187 mg/dL — ABNORMAL HIGH (ref 70–99)
Glucose-Capillary: 178 mg/dL — ABNORMAL HIGH (ref 70–99)
Glucose-Capillary: 211 mg/dL — ABNORMAL HIGH (ref 70–99)

## 2019-07-31 LAB — CBC
HCT: 36.6 % — ABNORMAL LOW (ref 39.0–52.0)
Hemoglobin: 12.4 g/dL — ABNORMAL LOW (ref 13.0–17.0)
MCH: 32.8 pg (ref 26.0–34.0)
MCHC: 33.9 g/dL (ref 30.0–36.0)
MCV: 96.8 fL (ref 80.0–100.0)
Platelets: 255 10*3/uL (ref 150–400)
RBC: 3.78 MIL/uL — ABNORMAL LOW (ref 4.22–5.81)
RDW: 12.8 % (ref 11.5–15.5)
WBC: 5.7 10*3/uL (ref 4.0–10.5)
nRBC: 0 % (ref 0.0–0.2)

## 2019-07-31 LAB — MAGNESIUM: Magnesium: 1.8 mg/dL (ref 1.7–2.4)

## 2019-07-31 LAB — HEMOGLOBIN A1C
Hgb A1c MFr Bld: 8.9 % — ABNORMAL HIGH (ref 4.8–5.6)
Mean Plasma Glucose: 208.73 mg/dL

## 2019-07-31 LAB — TSH: TSH: 2.18 u[IU]/mL (ref 0.350–4.500)

## 2019-07-31 LAB — VITAMIN B12: Vitamin B-12: 873 pg/mL (ref 180–914)

## 2019-07-31 LAB — SARS CORONAVIRUS 2 (TAT 6-24 HRS): SARS Coronavirus 2: NEGATIVE

## 2019-07-31 MED ORDER — INSULIN ASPART 100 UNIT/ML ~~LOC~~ SOLN: 0.0000 [IU] | Freq: Every day | SUBCUTANEOUS | Status: DC

## 2019-07-31 MED ORDER — POTASSIUM CHLORIDE CRYS ER 20 MEQ PO TBCR
20.0000 meq | EXTENDED_RELEASE_TABLET | Freq: Two times a day (BID) | ORAL | Status: DC
  Administered 2019-07-31 – 2019-08-01 (×3): 20 meq via ORAL
  Filled 2019-07-31 (×3): qty 1

## 2019-07-31 MED ORDER — SODIUM CHLORIDE 0.9 % IV SOLN: INTRAVENOUS | Status: DC

## 2019-07-31 MED ORDER — POTASSIUM CHLORIDE CRYS ER 20 MEQ PO TBCR
40.0000 meq | EXTENDED_RELEASE_TABLET | Freq: Once | ORAL | Status: AC
  Administered 2019-07-31: 40 meq via ORAL
  Filled 2019-07-31: qty 2

## 2019-07-31 MED ORDER — HYDRALAZINE HCL 20 MG/ML IJ SOLN: 10.0000 mg | Freq: Three times a day (TID) | INTRAMUSCULAR | Status: DC | PRN

## 2019-07-31 MED ORDER — NIFEDIPINE ER OSMOTIC RELEASE 30 MG PO TB24
90.0000 mg | ORAL_TABLET | Freq: Every day | ORAL | Status: DC
  Administered 2019-07-31 – 2019-08-01 (×2): 90 mg via ORAL
  Filled 2019-07-31 (×2): qty 3

## 2019-07-31 MED ORDER — HEPARIN SODIUM (PORCINE) 5000 UNIT/ML IJ SOLN
5000.0000 [IU] | Freq: Three times a day (TID) | INTRAMUSCULAR | Status: DC
  Administered 2019-07-31 – 2019-08-01 (×4): 5000 [IU] via SUBCUTANEOUS
  Filled 2019-07-31 (×4): qty 1

## 2019-07-31 MED ORDER — INSULIN ASPART 100 UNIT/ML ~~LOC~~ SOLN: 0.0000 [IU] | Freq: Three times a day (TID) | SUBCUTANEOUS | Status: DC

## 2019-07-31 MED ORDER — OCUVITE-LUTEIN PO CAPS
1.0000 | ORAL_CAPSULE | Freq: Every day | ORAL | Status: DC
  Administered 2019-07-31 – 2019-08-01 (×2): 1 via ORAL
  Filled 2019-07-31 (×2): qty 1

## 2019-07-31 MED ORDER — ACETAMINOPHEN 160 MG/5ML PO SOLN: 650.0000 mg | ORAL | Status: DC | PRN

## 2019-07-31 MED ORDER — INSULIN DETEMIR 100 UNIT/ML ~~LOC~~ SOLN
7.0000 [IU] | Freq: Every day | SUBCUTANEOUS | Status: DC
  Filled 2019-07-31 (×2): qty 0.07

## 2019-07-31 MED ORDER — EZETIMIBE 10 MG PO TABS
10.0000 mg | ORAL_TABLET | Freq: Every day | ORAL | Status: DC
  Administered 2019-07-31 – 2019-08-01 (×2): 10 mg via ORAL
  Filled 2019-07-31 (×2): qty 1

## 2019-07-31 MED ORDER — STROKE: EARLY STAGES OF RECOVERY BOOK
Freq: Once | Status: AC
  Administered 2019-07-31: 18:00:00
  Filled 2019-07-31: qty 1

## 2019-07-31 MED ORDER — ACETAMINOPHEN 650 MG RE SUPP: 650.0000 mg | RECTAL | Status: DC | PRN

## 2019-07-31 MED ORDER — ACETAMINOPHEN 325 MG PO TABS: 650.0000 mg | ORAL_TABLET | ORAL | Status: DC | PRN

## 2019-07-31 MED ORDER — ASPIRIN 300 MG RE SUPP: 300.0000 mg | Freq: Every day | RECTAL | Status: DC

## 2019-07-31 MED ORDER — SENNOSIDES-DOCUSATE SODIUM 8.6-50 MG PO TABS
1.0000 | ORAL_TABLET | Freq: Every evening | ORAL | Status: DC | PRN
  Filled 2019-07-31: qty 1

## 2019-07-31 MED ORDER — ASPIRIN 325 MG PO TABS
325.0000 mg | ORAL_TABLET | Freq: Every day | ORAL | Status: DC
  Administered 2019-07-31 – 2019-08-01 (×2): 325 mg via ORAL
  Filled 2019-07-31 (×2): qty 1

## 2019-07-31 NOTE — Plan of Care (Signed)
  Problem: Education: Goal: Knowledge of General Education information will improve Description: Including pain rating scale, medication(s)/side effects and non-pharmacologic comfort measures Outcome: Progressing   Problem: Clinical Measurements: Goal: Ability to maintain clinical measurements within normal limits will improve Outcome: Progressing   Problem: Activity: Goal: Risk for activity intolerance will decrease Outcome: Progressing   Problem: Elimination: Goal: Will not experience complications related to urinary retention Outcome: Progressing

## 2019-07-31 NOTE — Evaluation (Signed)
Speech Language Pathology Evaluation Patient Details Name: Wayne Perkins MRN: [id number] DOB: 28-Mar-1941 Today's Date: 07/31/2019 Time: [id number] SLP Time Calculation (min) (ACUTE ONLY): 21 min  Problem List:  Patient Active Problem List   Diagnosis Date Noted  . Acute ischemic stroke ([place]) 07/31/2019  . Hypertension, essential 01/24/2019  . TIA (transient ischemic attack) 01/24/2019  . CAD (coronary artery disease) 01/24/2019  . Biochemically recurrent malignant neoplasm of prostate 01/24/2019  . Controlled type 2 diabetes mellitus without complication, without long-term current use of insulin ([place]) 01/24/2019  . Macular degeneration 01/24/2019   Past Medical History:  Past Medical History:  Diagnosis Date  . Arthritis   . Blood in stool   . Childhood asthma   . Heart disease   . Hypertension, essential   . Prostate cancer ([place])   . Stroke [facility])    Past Surgical History:  Past Surgical History:  Procedure Laterality Date  . CORONARY ANGIOPLASTY WITH STENT PLACEMENT    . GANGLION CYST EXCISION Right    HPI:  Wayne Perkins is a 78 yo male Patient c/o left leg and arm numbness for the past day. Patient states yesterday, around noon, he noticed left leg numbness. Symptoms mild-mod, persistent, constant, acute onset. Symptoms have persisted since. Last night, also noted left arm numbness. Denies loss of normal function. No neck or radicular pain. No headache. No change in speech or vision. No weakness. No problems w balance/gait. Denies chest pain or discomfort. No sob. No arm or leg swelling. No fever or chills. MRI shows: 8 mm acute lacunar infarct within the right thalamus   Assessment / Plan / Recommendation Clinical Impression  SLE completed at bedside. Pt presents with baseline mild dysarthria from previous stroke and normal cognitive linguistic functioning otherwise. Pt without changes in swallowing, speech, language, or cognition. His wife was present during the evaluation and  agreed that Pt is at his baseline. No further SLP services indicated at this time.     SLP Assessment  SLP Recommendation/Assessment: Patient does not need any further Speech Lanaguage Pathology Services SLP Visit Diagnosis: Cognitive communication deficit (R41.841)    Follow Up Recommendations  None    Frequency and Duration           SLP Evaluation Cognition  Overall Cognitive Status: Within Functional Limits for tasks assessed Arousal/Alertness: Awake/alert Orientation Level: Oriented X4 Memory: Appears intact Awareness: Appears intact Problem Solving: Appears intact Safety/Judgment: Appears intact       Comprehension  Auditory Comprehension Overall Auditory Comprehension: Appears within functional limits for tasks assessed Yes/No Questions: Within Functional Limits Commands: Within Functional Limits Conversation: Complex Visual Recognition/Discrimination Discrimination: Within Function Limits Reading Comprehension Reading Status: Within funtional limits    Expression Expression Primary Mode of Expression: Verbal Verbal Expression Overall Verbal Expression: Appears within functional limits for tasks assessed Initiation: No impairment Automatic Speech: Name;Social Response;Month of year Level of Generative/Spontaneous Verbalization: Conversation Repetition: No impairment Naming: No impairment Pragmatics: No impairment Non-Verbal Means of Communication: Not applicable Written Expression Dominant Hand: Right Written Expression: Not tested   Oral / Motor  Oral Motor/Sensory Function Overall Oral Motor/Sensory Function: Within functional limits Motor Speech Overall Motor Speech: Appears within functional limits for tasks assessed Respiration: Within functional limits Phonation: Normal Resonance: Within functional limits Articulation: Within functional limitis Intelligibility: Intelligible Motor Planning: Witnin functional limits Motor Speech Errors: Not  applicable Interfering Components: (very mild dysarthria from previous stroke)   Thank you,  Genene Churn, [place]  Juno Bozard 07/31/2019, 1:24 PM

## 2019-07-31 NOTE — ED Provider Notes (Addendum)
[facility] EMERGENCY DEPARTMENT Provider Note   CSN: [id number] Arrival date & time: 07/31/19  0206     History   Chief Complaint Chief Complaint  Patient presents with  . Numbness    HPI Wayne Perkins is a 78 y.o. male.     Patient c/o left leg and arm numbness for the past day. Patient states yesterday, around noon, he noticed left leg numbness. Symptoms mild-mod, persistent, constant, acute onset. Symptoms have persisted since. Last night, also noted left arm numbness. Denies loss of normal function. No neck or radicular pain. No headache. No change in speech or vision. No weakness. No problems w balance/gait. Denies chest pain or discomfort. No sob. No arm or leg swelling. No fever or chills.   The history is provided by the patient.    Past Medical History:  Diagnosis Date  . Arthritis   . Blood in stool   . Childhood asthma   . Heart disease   . Hypertension, essential   . Prostate cancer ([place])   . Stroke [facility])     Patient Active Problem List   Diagnosis Date Noted  . Hypertension, essential 01/24/2019  . TIA (transient ischemic attack) 01/24/2019  . CAD (coronary artery disease) 01/24/2019  . Biochemically recurrent malignant neoplasm of prostate 01/24/2019  . Controlled type 2 diabetes mellitus without complication, without long-term current use of insulin ([place]) 01/24/2019  . Macular degeneration 01/24/2019    Past Surgical History:  Procedure Laterality Date  . CORONARY ANGIOPLASTY WITH STENT PLACEMENT    . GANGLION CYST EXCISION Right         Home Medications    Prior to Admission medications   Medication Sig Start Date End Date Taking? Authorizing Provider  aspirin EC 81 MG tablet Take 81 mg by mouth daily.    [provider]  clopidogrel (PLAVIX) 75 MG tablet Take 1 tablet (75 mg total) by mouth daily. 01/31/19   [name], MD  empagliflozin (JARDIANCE) 25 MG TABS tablet Take 25 mg by mouth daily before breakfast. 05/21/19   [name], MD  ezetimibe (ZETIA) 10 MG tablet Take 1 tablet (10 mg total) by mouth daily. 01/24/19   [name], MD  hydrochlorothiazide (HYDRODIURIL) 25 MG tablet Take 1 tablet (25 mg total) by mouth daily. 01/31/19   [name], MD  Multiple Vitamins-Minerals (PRESERVISION AREDS) CAPS Take 1 capsule by mouth daily.    [provider]  NIFEdipine (PROCARDIA XL/NIFEDICAL-XL) 90 MG 24 hr tablet Take 1 tablet (90 mg total) by mouth daily. 01/31/19   [name], MD  potassium chloride (KLOR-CON) 20 MEQ tablet Take 1 tablet (20 mEq total) by mouth 2 (two) times daily. 06/29/19   [name], MD  saw palmetto 160 MG capsule Take 160 mg by mouth daily.     [provider]    Family History Family History  Problem Relation Age of Onset  . Hyperlipidemia Mother   . Hypertension Mother   . Stroke Father   . Hyperlipidemia Sister   . Depression Brother   . Early death Brother   . Heart disease Brother   . Mental illness Brother     Social History Social History   Tobacco Use  . Smoking status: Former Research scientist (life sciences)  . Smokeless tobacco: Never Used  Substance Use Topics  . Alcohol use: Not Currently  . Drug use: Never     Allergies   Statins   Review of Systems Review of Systems  Constitutional: Negative for  fever.  HENT: Negative for sore throat.   Eyes: Negative for redness and visual disturbance.  Respiratory: Negative for shortness of breath.   Cardiovascular: Negative for chest pain.  Gastrointestinal: Negative for abdominal pain.  Genitourinary: Negative for flank pain.  Musculoskeletal: Negative for back pain and neck pain.  Skin: Negative for rash.  Neurological: Positive for numbness. Negative for speech difficulty and headaches.  Hematological: Does not bruise/bleed easily.  Psychiatric/Behavioral: Negative for confusion.     Physical Exam Updated Vital Signs BP (!) 153/91   Pulse 84   Temp 97.6 F (36.4 C) (Oral)   Resp (!) 25   Ht 1.791  m (5' 10.5")   Wt 77.1 kg   SpO2 97%   BMI 24.05 kg/m   Physical Exam Vitals signs and nursing note reviewed.  Constitutional:      Appearance: Normal appearance. He is well-developed.  HENT:     Head: Atraumatic.     Nose: Nose normal.     Mouth/Throat:     Mouth: Mucous membranes are moist.     Pharynx: Oropharynx is clear.  Eyes:     General: No scleral icterus.    Conjunctiva/sclera: Conjunctivae normal.     Pupils: Pupils are equal, round, and reactive to light.  Neck:     Musculoskeletal: Normal range of motion and neck supple. No neck rigidity or muscular tenderness.     Vascular: No carotid bruit.     Trachea: No tracheal deviation.  Cardiovascular:     Rate and Rhythm: Normal rate and regular rhythm.     Pulses: Normal pulses.     Heart sounds: Normal heart sounds. No murmur. No friction rub. No gallop.   Pulmonary:     Effort: Pulmonary effort is normal. No accessory muscle usage or respiratory distress.     Breath sounds: Normal breath sounds.  Abdominal:     General: Bowel sounds are normal. There is no distension.     Palpations: Abdomen is soft.     Tenderness: There is no abdominal tenderness. There is no guarding.  Genitourinary:    Comments: No cva tenderness. Musculoskeletal:        General: No swelling.     Comments: CTLS spine, non tender, aligned, no step off.   Skin:    General: Skin is warm and dry.     Findings: No rash.  Neurological:     Mental Status: He is alert.     Cranial Nerves: No cranial nerve deficit.     Comments: Alert, speech clear/fluent. Motor intact bil upper and lower ext, stre 5/5. No pronator drift. sens grossly intact bil but states LUE/LLE feel different that other side of body.   Psychiatric:        Mood and Affect: Mood normal.      ED Treatments / Results  Labs (all labs ordered are listed, but only abnormal results are displayed) Results for orders placed or performed during the hospital encounter of 07/31/19   CBC  Result Value Ref Range   WBC 5.7 4.0 - 10.5 K/uL   RBC 3.78 (L) 4.22 - 5.81 MIL/uL   Hemoglobin 12.4 (L) 13.0 - 17.0 g/dL   HCT 36.6 (L) 39.0 - 52.0 %   MCV 96.8 80.0 - 100.0 fL   MCH 32.8 26.0 - 34.0 pg   MCHC 33.9 30.0 - 36.0 g/dL   RDW 12.8 11.5 - 15.5 %   Platelets 255 150 - 400 K/uL   nRBC 0.0 0.0 -  0.2 %  CMET  Result Value Ref Range   Sodium 138 135 - 145 mmol/L   Potassium 3.0 (L) 3.5 - 5.1 mmol/L   Chloride 100 98 - 111 mmol/L   CO2 28 22 - 32 mmol/L   Glucose, Bld 233 (H) 70 - 99 mg/dL   BUN 40 (H) 8 - 23 mg/dL   Creatinine, Ser 1.38 (H) 0.61 - 1.24 mg/dL   Calcium 9.6 8.9 - 10.3 mg/dL   Total Protein 7.2 6.5 - 8.1 g/dL   Albumin 4.1 3.5 - 5.0 g/dL   AST 21 15 - 41 U/L   ALT 20 0 - 44 U/L   Alkaline Phosphatase 59 38 - 126 U/L   Total Bilirubin 0.4 0.3 - 1.2 mg/dL   GFR calc non Af Amer 49 (L) >60 mL/min   GFR calc Af Amer 56 (L) >60 mL/min   Anion gap 10 5 - 15    EKG EKG Interpretation  Date/Time:  Tuesday July 31 2019 02:10:52 EST Ventricular Rate:  84 PR Interval:    QRS Duration: 119 QT Interval:  416 QTC Calculation: 492 R Axis:   28 Text Interpretation: Sinus rhythm Prolonged PR interval Nonspecific intraventricular conduction delay Non-specific ST-t changes No previous tracing Confirmed by Lajean Saver [phone]) on 07/31/2019 2:48:00 AM   Radiology Ct Head Wo Contrast  Result Date: 07/31/2019 CLINICAL DATA:  78 year old male with left extremity numbness since yesterday. Prostate cancer. EXAM: CT HEAD WITHOUT CONTRAST CT CERVICAL SPINE WITHOUT CONTRAST TECHNIQUE: Multidetector CT imaging of the head and cervical spine was performed following the standard protocol without intravenous contrast. Multiplanar CT image reconstructions of the cervical spine were also generated. COMPARISON:  CT Abdomen and Pelvis and whole-body bone scan 01/15/2019. FINDINGS: CT HEAD FINDINGS Brain: Cerebral volume is within normal limits for age. No midline shift,  ventriculomegaly, mass effect, evidence of mass lesion, intracranial hemorrhage or evidence of cortically based acute infarction. Scattered and confluent bilateral cerebral white matter hypodensity. Involvement of the deep gray nuclei, mostly the basal ganglia. No cortical encephalomalacia identified. Vascular: Calcified atherosclerosis at the skull base. No suspicious intracranial vascular hyperdensity. Skull: Numerous small scattered sclerotic foci at the visible skull base and also in the visible vertebrae (series 4, image 4). There is also involvement of the bilateral mandible condyles. No lytic osseous lesion identified. Sinuses/Orbits: Widespread polypoid paranasal sinus mucosal thickening. Similar polypoid opacity in the visible nasal cavity. Tympanic cavities and mastoids are clear. Other: Visualized orbits and scalp soft tissues are within normal limits. CT CERVICAL SPINE FINDINGS Alignment: Degenerative appearing anterolisthesis of C3 on C4, the posterior elements of which are ankylosed. Similar degenerative appearing anterolisthesis of C4 on C5 with associated advanced facet arthropathy on the left. Skull base and vertebrae: Innumerable scattered sclerotic lesions throughout the skull base and visible spine and ribs. Involvement also the visible right clavicle. The skull base appears to remain intact. No superimposed cervical spine fracture identified. Soft tissues and spinal canal: No prevertebral fluid or swelling. No visible canal hematoma. Disc levels: Widespread cervical spine degeneration. Severe anterior C1 odontoid degeneration. Severe facet arthropathy at C2-C3 and C4-C5 on the left superimposed on chronic C3-C4 posterior element ankylosis. Advanced disc and endplate degeneration at C5-C6. Up to mild associated degenerative appearing spinal stenosis. Upper chest: Diffuse small sclerotic lesions. Possible mild T2 superior endplate compression (sagittal image 30). Negative lung apices. IMPRESSION:  1. Innumerable small sclerotic foci in the skeleton compatible with widespread osseous metastatic disease from prostate cancer, new since  May. 2. Evidence of small vessel ischemic disease in the brain. No acute intracranial abnormality by CT. 3. No acute traumatic injury identified in the cervical spine. Widespread advanced cervical spine degeneration. Electronically Signed   By: Genevie Ann M.D.   On: 07/31/2019 03:35   Ct Cervical Spine Wo Contrast  Result Date: 07/31/2019 CLINICAL DATA:  78 year old male with left extremity numbness since yesterday. Prostate cancer. EXAM: CT HEAD WITHOUT CONTRAST CT CERVICAL SPINE WITHOUT CONTRAST TECHNIQUE: Multidetector CT imaging of the head and cervical spine was performed following the standard protocol without intravenous contrast. Multiplanar CT image reconstructions of the cervical spine were also generated. COMPARISON:  CT Abdomen and Pelvis and whole-body bone scan 01/15/2019. FINDINGS: CT HEAD FINDINGS Brain: Cerebral volume is within normal limits for age. No midline shift, ventriculomegaly, mass effect, evidence of mass lesion, intracranial hemorrhage or evidence of cortically based acute infarction. Scattered and confluent bilateral cerebral white matter hypodensity. Involvement of the deep gray nuclei, mostly the basal ganglia. No cortical encephalomalacia identified. Vascular: Calcified atherosclerosis at the skull base. No suspicious intracranial vascular hyperdensity. Skull: Numerous small scattered sclerotic foci at the visible skull base and also in the visible vertebrae (series 4, image 4). There is also involvement of the bilateral mandible condyles. No lytic osseous lesion identified. Sinuses/Orbits: Widespread polypoid paranasal sinus mucosal thickening. Similar polypoid opacity in the visible nasal cavity. Tympanic cavities and mastoids are clear. Other: Visualized orbits and scalp soft tissues are within normal limits. CT CERVICAL SPINE FINDINGS  Alignment: Degenerative appearing anterolisthesis of C3 on C4, the posterior elements of which are ankylosed. Similar degenerative appearing anterolisthesis of C4 on C5 with associated advanced facet arthropathy on the left. Skull base and vertebrae: Innumerable scattered sclerotic lesions throughout the skull base and visible spine and ribs. Involvement also the visible right clavicle. The skull base appears to remain intact. No superimposed cervical spine fracture identified. Soft tissues and spinal canal: No prevertebral fluid or swelling. No visible canal hematoma. Disc levels: Widespread cervical spine degeneration. Severe anterior C1 odontoid degeneration. Severe facet arthropathy at C2-C3 and C4-C5 on the left superimposed on chronic C3-C4 posterior element ankylosis. Advanced disc and endplate degeneration at C5-C6. Up to mild associated degenerative appearing spinal stenosis. Upper chest: Diffuse small sclerotic lesions. Possible mild T2 superior endplate compression (sagittal image 30). Negative lung apices. IMPRESSION: 1. Innumerable small sclerotic foci in the skeleton compatible with widespread osseous metastatic disease from prostate cancer, new since May. 2. Evidence of small vessel ischemic disease in the brain. No acute intracranial abnormality by CT. 3. No acute traumatic injury identified in the cervical spine. Widespread advanced cervical spine degeneration. Electronically Signed   By: Genevie Ann M.D.   On: 07/31/2019 03:35    Procedures Procedures (including critical care time)  Medications Ordered in ED Medications - No data to display   Initial Impression / Assessment and Plan / ED Course  I have reviewed the triage vital signs and the nursing notes.  Pertinent labs & imaging results that were available during my care of the patient were reviewed by me and considered in my medical decision making (see chart for details).  Iv ns. Labs sent. Ct imaging ordered.   Reviewed nursing  notes and prior charts for additional history.   Labs reviewed/interpreted by me - wbc normal. hgb 12.4. k low, 3. kcl po.  CTs reviewed/interpreted by me - no acute hem. degen changes cervical spine. Bony lesions ?mets from possible prostate ca.  Given new neuro  symptoms for ~36 hrs duration, will get MRI this AM r/o new/recent cva.   0715, signed out to Dr Rogene Houston to check MR result when back and disposition accordingly.   Final Clinical Impressions(s) / ED Diagnoses   Final diagnoses:  None    ED Discharge Orders    None           Lajean Saver, MD 07/31/19 [phone]

## 2019-07-31 NOTE — Progress Notes (Signed)
Patient is refusing insulin; Dr. Dyann Kief notified. Patient educated again on the importance of insulin and why it is ordered; I explained to patient the result of his A1C and CBG and that sugar can't be well controlled while in the hospital with po meds; patient still refuses insulin after being educated further and states he has an appointment scheduled for next Monday regarding his diabetes. Will continue to educate and offer insulin as scheduled.  Deirdre Pippins, RN

## 2019-07-31 NOTE — Plan of Care (Signed)

## 2019-07-31 NOTE — ED Triage Notes (Signed)
Pt brought in by [facility] for c/o left leg and arm numbness that started yesterday am when he woke up; tonight pt states he woke up around 12:30 with left arm numbness; pt states he still has some numbness to his left leg; pt denies any other symptoms

## 2019-07-31 NOTE — H&P (Signed)
History and Physical    Wayne Perkins [id number] DOB: [birth year] DOA: 07/31/2019  Referring MD/NP/PA: Dr. Rogene Houston PCP: [name], MD  Patient coming from: Home  Chief Complaint: Left-sided numbness  HPI: Wayne Perkins is a 78 y.o. male with past medical history significant for metastatic prostate cancer, hypertension, type 2 diabetes mellitus with nephropathy, CKD stage 3a and hyperlipidemia; who presented to the emergency department secondary to left-sided numbness.  Patient reports symptom has been present for the last 2 days prior to admission.  He initially noticed numbness on his left leg that suddenly extended to affect his left upper extremity as well.  Symptoms has been persistent, constant over the sudden presentation.  Patient denies frank pain.  No headaches, no chest pain, no palpitations, no nausea, no vomiting, no fever, no chills, no productive cough or shortness of breath; reports no contact with anyone with a positive Covid diagnosis.  In the ED work-up demonstrated normal WBCs, hemoglobin 12.4, normal platelets; a potassium of 3.0, CBGs in the 233 range, creatinine 1.38, normal TSH, normal magnesium.  CT head/neck demonstrated no acute intracranial abnormalities; innumerable small sclerotic foci in the skeleton compatible with widespread osseous metastatic disease from prostate cancer.  MRI demonstrated 8 mm acute lacunar infarct within the right thalamus; moderate chronic small vessel ischemic disease with multiple chronic lacunar infarcts, mild generalized parenchymal atrophy.   No intracranial large facial occlusion or proximal high-grade arterial stenosis identified on MRA of the head.  Neurology service was consulted, [facility] called on the patient for further evaluation and management.  Past Medical/Surgical History: Past Medical History:  Diagnosis Date  . Arthritis   . Blood in stool   . Childhood asthma   . Heart disease   . Hypertension, essential   . Prostate  cancer ([place])   . Stroke [facility])     Past Surgical History:  Procedure Laterality Date  . CORONARY ANGIOPLASTY WITH STENT PLACEMENT    . GANGLION CYST EXCISION Right     Social History:  reports that he has quit smoking. He has never used smokeless tobacco. He reports previous alcohol use. He reports that he does not use drugs.  Allergies: Allergies  Allergen Reactions  . Statins Other (See Comments)    Causes narcolepsy    Family History:  Family History  Problem Relation Age of Onset  . Hyperlipidemia Mother   . Hypertension Mother   . Stroke Father   . Hyperlipidemia Sister   . Depression Brother   . Early death Brother   . Heart disease Brother   . Mental illness Brother     Prior to Admission medications   Medication Sig Start Date End Date Taking? Authorizing Provider  aspirin EC 81 MG tablet Take 81 mg by mouth daily.    [provider]  clopidogrel (PLAVIX) 75 MG tablet Take 1 tablet (75 mg total) by mouth daily. 01/31/19   [name], MD  empagliflozin (JARDIANCE) 25 MG TABS tablet Take 25 mg by mouth daily before breakfast. 05/21/19   [name], MD  ezetimibe (ZETIA) 10 MG tablet Take 1 tablet (10 mg total) by mouth daily. 01/24/19   [name], MD  hydrochlorothiazide (HYDRODIURIL) 25 MG tablet Take 1 tablet (25 mg total) by mouth daily. 01/31/19   [name], MD  Multiple Vitamins-Minerals (PRESERVISION AREDS) CAPS Take 1 capsule by mouth daily.    [provider]  NIFEdipine (PROCARDIA XL/NIFEDICAL-XL) 90 MG 24 hr tablet Take 1 tablet (90 mg total) by mouth daily.  01/31/19   [name], MD  potassium chloride (KLOR-CON) 20 MEQ tablet Take 1 tablet (20 mEq total) by mouth 2 (two) times daily. 06/29/19   [name], MD  saw palmetto 160 MG capsule Take 160 mg by mouth daily.     [provider]    Review of Systems:  Negative except as otherwise mentioned in HPI.   Physical Exam: Vitals:   07/31/19 0515 07/31/19  0530 07/31/19 0600 07/31/19 0630  BP:  (!) 133/95 137/89 (!) 142/96  Pulse: 80 77 79 80  Resp: 11 14 18  (!) 21  Temp:      TempSrc:      SpO2: 100% 96% 96% 99%  Weight:      Height:       Constitutional: NAD, calm, comfortable; no chest pain, no palpitations.  Patient is afebrile and denies nausea or vomiting. Eyes: PERRL, lids and conjunctivae normal, no icterus, no nystagmus. ENMT: Mucous membranes are moist. Posterior pharynx clear of any exudate or lesions. Neck: normal, supple, no masses, no thyromegaly, no JVD. Respiratory: clear to auscultation bilaterally, no wheezing, no crackles. Normal respiratory effort. No accessory muscle use.  Cardiovascular: Regular rate and rhythm, no murmurs / rubs / gallops. No extremity edema. 2+ pedal pulses. No carotid bruits.  Abdomen: no tenderness, no masses palpated. No hepatosplenomegaly. Bowel sounds positive.  Musculoskeletal: no clubbing / cyanosis. No joint deformity upper and lower extremities. Good ROM, no contractures. Normal muscle tone.  Skin: no rashes, lesions, ulcers. No induration Neurologic: CN 2-12 grossly intact. DTR normal. Strength 5/5 in all 4.  Patient reports numbness in his left upper extremity and left lower extremity. Psychiatric: Normal judgment and insight. Alert and oriented x 3. Normal mood.    Labs on Admission: I have personally reviewed the following labs and imaging studies  CBC: Recent Labs  Lab 07/31/19 0253  WBC 5.7  HGB 12.4*  HCT 36.6*  MCV 96.8  PLT 123456   Basic Metabolic Panel: Recent Labs  Lab 07/31/19 0253  NA 138  K 3.0*  CL 100  CO2 28  GLUCOSE 233*  BUN 40*  CREATININE 1.38*  CALCIUM 9.6   GFR: Estimated Creatinine Clearance: 46.3 mL/min (A) (by C-G formula based on SCr of 1.38 mg/dL (H)).   Liver Function Tests: Recent Labs  Lab 07/31/19 0253  AST 21  ALT 20  ALKPHOS 59  BILITOT 0.4  PROT 7.2  ALBUMIN 4.1   Radiological Exams on Admission: Ct Head Wo  Contrast  Result Date: 07/31/2019 CLINICAL DATA:  78 year old male with left extremity numbness since yesterday. Prostate cancer. EXAM: CT HEAD WITHOUT CONTRAST CT CERVICAL SPINE WITHOUT CONTRAST TECHNIQUE: Multidetector CT imaging of the head and cervical spine was performed following the standard protocol without intravenous contrast. Multiplanar CT image reconstructions of the cervical spine were also generated. COMPARISON:  CT Abdomen and Pelvis and whole-body bone scan 01/15/2019. FINDINGS: CT HEAD FINDINGS Brain: Cerebral volume is within normal limits for age. No midline shift, ventriculomegaly, mass effect, evidence of mass lesion, intracranial hemorrhage or evidence of cortically based acute infarction. Scattered and confluent bilateral cerebral white matter hypodensity. Involvement of the deep gray nuclei, mostly the basal ganglia. No cortical encephalomalacia identified. Vascular: Calcified atherosclerosis at the skull base. No suspicious intracranial vascular hyperdensity. Skull: Numerous small scattered sclerotic foci at the visible skull base and also in the visible vertebrae (series 4, image 4). There is also involvement of the bilateral mandible condyles. No lytic osseous lesion identified. Sinuses/Orbits:  Widespread polypoid paranasal sinus mucosal thickening. Similar polypoid opacity in the visible nasal cavity. Tympanic cavities and mastoids are clear. Other: Visualized orbits and scalp soft tissues are within normal limits. CT CERVICAL SPINE FINDINGS Alignment: Degenerative appearing anterolisthesis of C3 on C4, the posterior elements of which are ankylosed. Similar degenerative appearing anterolisthesis of C4 on C5 with associated advanced facet arthropathy on the left. Skull base and vertebrae: Innumerable scattered sclerotic lesions throughout the skull base and visible spine and ribs. Involvement also the visible right clavicle. The skull base appears to remain intact. No superimposed  cervical spine fracture identified. Soft tissues and spinal canal: No prevertebral fluid or swelling. No visible canal hematoma. Disc levels: Widespread cervical spine degeneration. Severe anterior C1 odontoid degeneration. Severe facet arthropathy at C2-C3 and C4-C5 on the left superimposed on chronic C3-C4 posterior element ankylosis. Advanced disc and endplate degeneration at C5-C6. Up to mild associated degenerative appearing spinal stenosis. Upper chest: Diffuse small sclerotic lesions. Possible mild T2 superior endplate compression (sagittal image 30). Negative lung apices. IMPRESSION: 1. Innumerable small sclerotic foci in the skeleton compatible with widespread osseous metastatic disease from prostate cancer, new since May. 2. Evidence of small vessel ischemic disease in the brain. No acute intracranial abnormality by CT. 3. No acute traumatic injury identified in the cervical spine. Widespread advanced cervical spine degeneration. Electronically Signed   By: Genevie Ann M.D.   On: 07/31/2019 03:35   Ct Cervical Spine Wo Contrast  Result Date: 07/31/2019 CLINICAL DATA:  78 year old male with left extremity numbness since yesterday. Prostate cancer. EXAM: CT HEAD WITHOUT CONTRAST CT CERVICAL SPINE WITHOUT CONTRAST TECHNIQUE: Multidetector CT imaging of the head and cervical spine was performed following the standard protocol without intravenous contrast. Multiplanar CT image reconstructions of the cervical spine were also generated. COMPARISON:  CT Abdomen and Pelvis and whole-body bone scan 01/15/2019. FINDINGS: CT HEAD FINDINGS Brain: Cerebral volume is within normal limits for age. No midline shift, ventriculomegaly, mass effect, evidence of mass lesion, intracranial hemorrhage or evidence of cortically based acute infarction. Scattered and confluent bilateral cerebral white matter hypodensity. Involvement of the deep gray nuclei, mostly the basal ganglia. No cortical encephalomalacia identified. Vascular:  Calcified atherosclerosis at the skull base. No suspicious intracranial vascular hyperdensity. Skull: Numerous small scattered sclerotic foci at the visible skull base and also in the visible vertebrae (series 4, image 4). There is also involvement of the bilateral mandible condyles. No lytic osseous lesion identified. Sinuses/Orbits: Widespread polypoid paranasal sinus mucosal thickening. Similar polypoid opacity in the visible nasal cavity. Tympanic cavities and mastoids are clear. Other: Visualized orbits and scalp soft tissues are within normal limits. CT CERVICAL SPINE FINDINGS Alignment: Degenerative appearing anterolisthesis of C3 on C4, the posterior elements of which are ankylosed. Similar degenerative appearing anterolisthesis of C4 on C5 with associated advanced facet arthropathy on the left. Skull base and vertebrae: Innumerable scattered sclerotic lesions throughout the skull base and visible spine and ribs. Involvement also the visible right clavicle. The skull base appears to remain intact. No superimposed cervical spine fracture identified. Soft tissues and spinal canal: No prevertebral fluid or swelling. No visible canal hematoma. Disc levels: Widespread cervical spine degeneration. Severe anterior C1 odontoid degeneration. Severe facet arthropathy at C2-C3 and C4-C5 on the left superimposed on chronic C3-C4 posterior element ankylosis. Advanced disc and endplate degeneration at C5-C6. Up to mild associated degenerative appearing spinal stenosis. Upper chest: Diffuse small sclerotic lesions. Possible mild T2 superior endplate compression (sagittal image 30). Negative lung apices. IMPRESSION:  1. Innumerable small sclerotic foci in the skeleton compatible with widespread osseous metastatic disease from prostate cancer, new since May. 2. Evidence of small vessel ischemic disease in the brain. No acute intracranial abnormality by CT. 3. No acute traumatic injury identified in the cervical spine.  Widespread advanced cervical spine degeneration. Electronically Signed   By: Genevie Ann M.D.   On: 07/31/2019 03:35   Mr Angio Head Wo Contrast  Result Date: 07/31/2019 CLINICAL DATA:  Focal neuro deficit, greater than 6 hours, stroke suspected. Additional history provided: Left leg and arm numbness that began yesterday. EXAM: MRI HEAD WITHOUT CONTRAST MRA HEAD WITHOUT CONTRAST TECHNIQUE: Multiplanar, multiecho pulse sequences of the brain and surrounding structures were obtained without intravenous contrast. Angiographic images of the head were obtained using MRA technique without contrast. COMPARISON:  Noncontrast head CT performed earlier the same day 07/31/2019. FINDINGS: MRI HEAD FINDINGS Brain: Multiple sequences are mildly motion degraded. 8 mm focus of restricted diffusion within the right thalamus consistent with acute lacunar infarct. Corresponding T2/FLAIR hyperintensity at this site. No evidence of intracranial mass. No midline shift or extra-axial fluid collection. There are few scattered chronic microhemorrhages within the bilateral cerebral hemispheres. Background of moderate chronic small vessel ischemic disease. Chronic lacunar infarcts within bilateral cerebral white matter, basal ganglia and thalami. Mild generalized parenchymal atrophy. Vascular: Reported separately. Skull and upper cervical spine: Multiple sclerotic lesions within the skull base and upper cervical spine were better appreciated on CT head/cervical spine performed earlier the same day. Sinuses/Orbits: Visualized orbits demonstrate no acute abnormality. Mild scattered paranasal sinus mucosal thickening. Small inferior right maxillary sinus mucous retention cyst. No significant mastoid effusion. MRA HEAD FINDINGS The examination is significantly motion degraded limiting evaluation for stenoses and small aneurysms. The intracranial internal carotid arteries are patent bilaterally without high-grade stenosis. The M1 right middle  cerebral artery is patent without significant stenosis. Motion artifact significantly limits evaluation of the M2 and more distal right MCA branches. The right anterior cerebral artery is patent without high-grade proximal stenosis. The M1 left middle cerebral artery is patent without significant stenosis. Motion artifact significantly limits evaluation of the M2 and more distal left MCA branches. The left anterior cerebral artery is patent without high-grade proximal stenosis. No intracranial aneurysm is identified. The intracranial vertebral arteries are patent without significant stenosis, as is the basilar artery. The bilateral posterior cerebral arteries are patent without high-grade proximal stenosis. Focal stenosis within the proximal P2 left posterior cerebral artery. Posterior communicating arteries are present bilaterally. IMPRESSION: MRI brain: 1. 8 mm acute lacunar infarct within the right thalamus. 2. Moderate chronic small vessel ischemic disease with multiple chronic lacunar infarcts. 3. Mild generalized parenchymal atrophy. MRA head: 1. Motion degraded examination, limiting evaluation for stenoses and for small aneurysms. 2. No intracranial large vessel occlusion or proximal high-grade arterial stenosis identified. However, please note that motion degradation significantly limits evaluation of the M2 and more distal MCA branches bilaterally. Electronically Signed   By: Kellie Simmering DO   On: 07/31/2019 07:57   Mr Brain Wo Contrast  Result Date: 07/31/2019 CLINICAL DATA:  Focal neuro deficit, greater than 6 hours, stroke suspected. Additional history provided: Left leg and arm numbness that began yesterday. EXAM: MRI HEAD WITHOUT CONTRAST MRA HEAD WITHOUT CONTRAST TECHNIQUE: Multiplanar, multiecho pulse sequences of the brain and surrounding structures were obtained without intravenous contrast. Angiographic images of the head were obtained using MRA technique without contrast. COMPARISON:   Noncontrast head CT performed earlier the same day 07/31/2019. FINDINGS:  MRI HEAD FINDINGS Brain: Multiple sequences are mildly motion degraded. 8 mm focus of restricted diffusion within the right thalamus consistent with acute lacunar infarct. Corresponding T2/FLAIR hyperintensity at this site. No evidence of intracranial mass. No midline shift or extra-axial fluid collection. There are few scattered chronic microhemorrhages within the bilateral cerebral hemispheres. Background of moderate chronic small vessel ischemic disease. Chronic lacunar infarcts within bilateral cerebral white matter, basal ganglia and thalami. Mild generalized parenchymal atrophy. Vascular: Reported separately. Skull and upper cervical spine: Multiple sclerotic lesions within the skull base and upper cervical spine were better appreciated on CT head/cervical spine performed earlier the same day. Sinuses/Orbits: Visualized orbits demonstrate no acute abnormality. Mild scattered paranasal sinus mucosal thickening. Small inferior right maxillary sinus mucous retention cyst. No significant mastoid effusion. MRA HEAD FINDINGS The examination is significantly motion degraded limiting evaluation for stenoses and small aneurysms. The intracranial internal carotid arteries are patent bilaterally without high-grade stenosis. The M1 right middle cerebral artery is patent without significant stenosis. Motion artifact significantly limits evaluation of the M2 and more distal right MCA branches. The right anterior cerebral artery is patent without high-grade proximal stenosis. The M1 left middle cerebral artery is patent without significant stenosis. Motion artifact significantly limits evaluation of the M2 and more distal left MCA branches. The left anterior cerebral artery is patent without high-grade proximal stenosis. No intracranial aneurysm is identified. The intracranial vertebral arteries are patent without significant stenosis, as is the basilar  artery. The bilateral posterior cerebral arteries are patent without high-grade proximal stenosis. Focal stenosis within the proximal P2 left posterior cerebral artery. Posterior communicating arteries are present bilaterally. IMPRESSION: MRI brain: 1. 8 mm acute lacunar infarct within the right thalamus. 2. Moderate chronic small vessel ischemic disease with multiple chronic lacunar infarcts. 3. Mild generalized parenchymal atrophy. MRA head: 1. Motion degraded examination, limiting evaluation for stenoses and for small aneurysms. 2. No intracranial large vessel occlusion or proximal high-grade arterial stenosis identified. However, please note that motion degradation significantly limits evaluation of the M2 and more distal MCA branches bilaterally. Electronically Signed   By: Kellie Simmering DO   On: 07/31/2019 07:57    EKG: Independently reviewed.  Borderline QT prolongation, normal axis, normal sinus rhythm.  Regular rate.  No acute ischemic changes.  Assessment/Plan 1-Acute ischemic stroke (HCC) -MRI with positive right thalamic infarct -Risk factors for stroke includes increased viscosity in the setting of underlying prostate cancer; hypertension, hyperlipidemia, type 2 diabetes and age. -Prior to admission patient was using aspirin and Plavix. -Patient will be admitted to telemetry bed -Check carotic Dopplers and 2D echo -Full dose aspirin started for secondary prevention initially; neurology service was consulted and will follow recommendations about future treatment. ? Use of anticoagulation (coumadin or novel agent). -Checking A1c, TSH and lipid panel. -Allow for permissive hypertension in the setting of acute ischemia.  2-essential hypertension -Continue home antihypertensive regimen, except for HCTZ -Closely monitoring vital signs while allowing permissive hypertension.    3-type 2 diabetes with nephropathy: Uncontrolled and poorly controlled with hyperglycemia on  presentation. -Holding oral hypoglycemic agents while inpatient. -Sliding scale insulin and Levemir ordered. -Repeat A1c.  Most recent one 8.1 (more than 3 months ago).  4-hyperlipidemia -Continue Zetia  -Patient expressed an intolerance/allergy reaction to the use of statins -Check lipid panel  5-chronic kidney disease a stage IIIa -Most likely associated with hypertension and type 2 diabetes -Appears to be stable and at baseline -Maintain adequate hydration and minimize the use of nephrotoxic agents -Follow  renal function trend.  6-hypokalemia -Replete electrolytes -HCTZ placed on hold -Will check magnesium level  7-metastatic prostate cancer -Continue outpatient follow-up with urology/oncology service. -Denies urinary retention symptoms.    DVT prophylaxis: heparin   Code Status: Full code. Family Communication: no family at bedside.  Disposition Plan: to be determined. Hopefully discharge home once stroke work up completed.  Consults called: neurology  Admission status: inpatient, LOS > 2 midnights, telemetry bed.   Time Spent: 70 minutes  Barton Dubois MD [facility] Pager [phone]   07/31/2019, 9:18 AM

## 2019-07-31 NOTE — ED Notes (Signed)
Pt to MRI

## 2019-08-01 ENCOUNTER — Inpatient Hospital Stay (HOSPITAL_COMMUNITY): Payer: Medicare HMO

## 2019-08-01 DIAGNOSIS — I361 Nonrheumatic tricuspid (valve) insufficiency: Secondary | ICD-10-CM

## 2019-08-01 DIAGNOSIS — I340 Nonrheumatic mitral (valve) insufficiency: Secondary | ICD-10-CM

## 2019-08-01 LAB — BASIC METABOLIC PANEL
Anion gap: 11 (ref 5–15)
BUN: 38 mg/dL — ABNORMAL HIGH (ref 8–23)
CO2: 27 mmol/L (ref 22–32)
Calcium: 9.5 mg/dL (ref 8.9–10.3)
Chloride: 102 mmol/L (ref 98–111)
Creatinine, Ser: 1.3 mg/dL — ABNORMAL HIGH (ref 0.61–1.24)
GFR calc Af Amer: >60 mL/min (ref >60)
GFR calc non Af Amer: 52 mL/min — ABNORMAL LOW (ref >60)
Glucose, Bld: 203 mg/dL — ABNORMAL HIGH (ref 70–99)
Potassium: 3.2 mmol/L — ABNORMAL LOW (ref 3.5–5.1)
Sodium: 140 mmol/L (ref 135–145)

## 2019-08-01 LAB — CBC
HCT: 36.6 % — ABNORMAL LOW (ref 39.0–52.0)
Hemoglobin: 12.3 g/dL — ABNORMAL LOW (ref 13.0–17.0)
MCH: 32.7 pg (ref 26.0–34.0)
MCHC: 33.6 g/dL (ref 30.0–36.0)
MCV: 97.3 fL (ref 80.0–100.0)
Platelets: 257 10*3/uL (ref 150–400)
RBC: 3.76 MIL/uL — ABNORMAL LOW (ref 4.22–5.81)
RDW: 12.8 % (ref 11.5–15.5)
WBC: 5.1 10*3/uL (ref 4.0–10.5)
nRBC: 0 % (ref 0.0–0.2)

## 2019-08-01 LAB — GLUCOSE, CAPILLARY
Glucose-Capillary: 188 mg/dL — ABNORMAL HIGH (ref 70–99)
Glucose-Capillary: 214 mg/dL — ABNORMAL HIGH (ref 70–99)

## 2019-08-01 LAB — LIPID PANEL
Cholesterol: 223 mg/dL — ABNORMAL HIGH (ref 0–200)
HDL: 60 mg/dL (ref >40)
LDL Cholesterol: 136 mg/dL — ABNORMAL HIGH (ref 0–99)
Total CHOL/HDL Ratio: 3.7 RATIO
Triglycerides: 135 mg/dL (ref <150)
VLDL: 27 mg/dL (ref 0–40)

## 2019-08-01 LAB — ECHOCARDIOGRAM COMPLETE
Height: 70 in
Weight: 2684.32 oz

## 2019-08-01 NOTE — Progress Notes (Signed)
Patient alert and oriented x4. No complaints of pain, shortness of breath, chest pain, dizziness, nausea or vomiting. Patient up out of bed, ambulatory independently with steady gait. Patient tolerated PO meds and diet well. Appetite good. IV removed without complications. Discharge instructions, appointment follow up appointments/information and medication education gone over with patient and patient's wife. Both expressed full understanding of instructions, appointment information and education. Attempted to call Dr. Freddie Apley office to schedule follow up but only got the voicemail. Patient stated that he will call the office in the morning to schedule follow up appointment per discharge instruction. Patient discharged with all belongings for home via car (son is driving patient home). Stroke booklet in with belongings.

## 2019-08-01 NOTE — Plan of Care (Signed)

## 2019-08-01 NOTE — Progress Notes (Signed)
Pt up to chair with 2 person assist, mittens removed. Pt took all meds by mouth including 0600 synthroid that he previously refused; water and applesauce given with meds; pt calm at present time with sitter at bedside; pt refused to have condom cath reapplied; urinal provided. Will continue to monitor.  Deirdre Pippins, RN

## 2019-08-01 NOTE — Progress Notes (Signed)
Inpatient Diabetes Program Recommendations  AACE/ADA: New Consensus Statement on Inpatient Glycemic Control   Target Ranges:  Prepandial:   less than 140 mg/dL      Peak postprandial:   less than 180 mg/dL (1-2 hours)      Critically ill patients:  140 - 180 mg/dL   Results for NAHUN, KRONBERG (MRN [id number]) as of 08/01/2019 10:46  Ref. Range 07/31/2019 12:21 07/31/2019 16:50 07/31/2019 20:47 08/01/2019 07:28  Glucose-Capillary Latest Ref Range: 70 - 99 mg/dL 187 (H) 178 (H) 211 (H) 188 (H)   Review of Glycemic Control  Diabetes history: DM2 Outpatient Diabetes medications: Jardiance 25 mg QAM Current orders for Inpatient glycemic control: Levemir 7 units QHS, Novolog 0-15 units TID with meals, Novolog 0-5 units QHS  Inpatient Diabetes Program Recommendations:   Insulin: Noted patient is refusing insulin depsite encouragement by staff. Please continue to encourage patient to take insulin as ordered when parameters are met.  Thanks, Barnie Alderman, RN, MSN, CDE Diabetes Coordinator Inpatient Diabetes Program [phone] (Team Pager from 8am to 5pm)

## 2019-08-01 NOTE — Discharge Summary (Signed)
Physician Discharge Summary  Wayne Perkins [id number] DOB: [birth year] DOA: 07/31/2019  PCP: [name], MD  Admit date: 07/31/2019  Discharge date: 08/01/2019  Admitted From:Home  Disposition:  Home  Recommendations for Outpatient Follow-up:  1. Follow up with PCP in 1-2 weeks Continue on aspirin and Plavix as prior Continue on Zetia as prior, no statin since patient is allergic to statins Follow-up with Dr. Merlene Laughter as discussed in 6 weeks  Home Health: None  Equipment/Devices: None  Discharge Condition: Stable  CODE STATUS: Full  Diet recommendation: Heart Healthy/carb modified  Brief/Interim Summary: Per HPI: Wayne Perkins is a 78 y.o. male with past medical history significant for metastatic prostate cancer, hypertension, type 2 diabetes mellitus with nephropathy, CKD stage 3a and hyperlipidemia; who presented to the emergency department secondary to left-sided numbness.  Patient reports symptom has been present for the last 2 days prior to admission.  He initially noticed numbness on his left leg that suddenly extended to affect his left upper extremity as well.  Symptoms has been persistent, constant over the sudden presentation.  Patient denies frank pain.  No headaches, no chest pain, no palpitations, no nausea, no vomiting, no fever, no chills, no productive cough or shortness of breath; reports no contact with anyone with a positive Covid diagnosis.  In the ED work-up demonstrated normal WBCs, hemoglobin 12.4, normal platelets; a potassium of 3.0, CBGs in the 233 range, creatinine 1.38, normal TSH, normal magnesium.  CT head/neck demonstrated no acute intracranial abnormalities; innumerable small sclerotic foci in the skeleton compatible with widespread osseous metastatic disease from prostate cancer.  MRI demonstrated 8 mm acute lacunar infarct within the right thalamus; moderate chronic small vessel ischemic disease with multiple chronic lacunar infarcts, mild generalized  parenchymal atrophy.   No intracranial large facial occlusion or proximal high-grade arterial stenosis identified on MRA of the head.  Neurology service was consulted, [facility] called on the patient for further evaluation and management.  12/2: Patient is noted to have a right thalamic lacunar CVA and has no residual deficits after PT evaluation this morning.  Carotid ultrasounds bilaterally with mild atherosclerosis noted and 2D echocardiogram with LVEF 60-65% with grade 1 diastolic dysfunction.  No other acute findings currently noted.  He denies any further numbness or other symptoms.  LDL is 136 and patient will remain on home Zetia as he is allergic to statins.  A1c is 8.9% and patient is on home Jardiance.  He will need close follow-up to his PCP for better control of his home blood glucose.  He states that he will follow-up with Dr. Merlene Laughter as requested in 6 weeks.  No other acute events noted throughout the course of this admission and patient is stable for discharge.  Discharge Diagnoses:  Principal Problem:   Acute ischemic stroke [facility]) Active Problems:   Hypertension, essential   Type 2 diabetes with nephropathy (HCC)   HLD (hyperlipidemia)   Malignant neoplasm of prostate metastatic to bone [facility])  Principal discharge diagnosis: Acute right thalamic lacunar CVA.  Discharge Instructions  Discharge Instructions    Diet - low sodium heart healthy   Complete by: As directed    Increase activity slowly   Complete by: As directed      Allergies as of 08/01/2019      Reactions   Statins Other (See Comments)   Causes narcolepsy      Medication List    TAKE these medications   aspirin EC 81 MG tablet Take 81 mg by mouth daily.  clopidogrel 75 MG tablet Commonly known as: PLAVIX Take 1 tablet (75 mg total) by mouth daily.   ezetimibe 10 MG tablet Commonly known as: Zetia Take 1 tablet (10 mg total) by mouth daily.   hydrochlorothiazide 25 MG tablet Commonly known as:  HYDRODIURIL Take 1 tablet (25 mg total) by mouth daily.   Jardiance 25 MG Tabs tablet Generic drug: empagliflozin Take 25 mg by mouth daily before breakfast.   NIFEdipine 90 MG 24 hr tablet Commonly known as: PROCARDIA XL/NIFEDICAL-XL Take 1 tablet (90 mg total) by mouth daily.   potassium chloride SA 20 MEQ tablet Commonly known as: KLOR-CON Take 1 tablet (20 mEq total) by mouth 2 (two) times daily.   PreserVision AREDS Caps Take 1 capsule by mouth daily.   saw palmetto 160 MG capsule Take 160 mg by mouth daily.      Follow-up Information    [name], MD Follow up in 1 week(s).   Specialty: Family Medicine Contact information: [place] [address] [phone]        Phillips Odor, MD Follow up in 6 week(s).   Specialty: Neurology Contact information: [address] [place] [address] [phone]          Allergies  Allergen Reactions  . Statins Other (See Comments)    Causes narcolepsy    Consultations:  Discussed with neurology on phone   Procedures/Studies: Dg Chest 2 View  Result Date: 07/31/2019 CLINICAL DATA:  Numbness left arm. EXAM: CHEST - 2 VIEW COMPARISON:  No recent. FINDINGS: Mediastinum hilar structures normal. Mild bibasilar subsegmental atelectasis and or scarring. Tiny calcified pulmonary nodules most consistent granulomas noted. No pleural effusion or pneumothorax. Heart size normal. No acute bony abnormality. Degenerative change thoracic spine. IMPRESSION: 1. No acute cardiopulmonary disease. Mild bibasilar subsegmental atelectasis and or scarring. Electronically Signed   By: Marcello Moores  Register   On: 07/31/2019 10:25   Ct Head Wo Contrast  Result Date: 07/31/2019 CLINICAL DATA:  78 year old male with left extremity numbness since yesterday. Prostate cancer. EXAM: CT HEAD WITHOUT CONTRAST CT CERVICAL SPINE WITHOUT CONTRAST TECHNIQUE: Multidetector CT imaging of the head and cervical spine was performed  following the standard protocol without intravenous contrast. Multiplanar CT image reconstructions of the cervical spine were also generated. COMPARISON:  CT Abdomen and Pelvis and whole-body bone scan 01/15/2019. FINDINGS: CT HEAD FINDINGS Brain: Cerebral volume is within normal limits for age. No midline shift, ventriculomegaly, mass effect, evidence of mass lesion, intracranial hemorrhage or evidence of cortically based acute infarction. Scattered and confluent bilateral cerebral white matter hypodensity. Involvement of the deep gray nuclei, mostly the basal ganglia. No cortical encephalomalacia identified. Vascular: Calcified atherosclerosis at the skull base. No suspicious intracranial vascular hyperdensity. Skull: Numerous small scattered sclerotic foci at the visible skull base and also in the visible vertebrae (series 4, image 4). There is also involvement of the bilateral mandible condyles. No lytic osseous lesion identified. Sinuses/Orbits: Widespread polypoid paranasal sinus mucosal thickening. Similar polypoid opacity in the visible nasal cavity. Tympanic cavities and mastoids are clear. Other: Visualized orbits and scalp soft tissues are within normal limits. CT CERVICAL SPINE FINDINGS Alignment: Degenerative appearing anterolisthesis of C3 on C4, the posterior elements of which are ankylosed. Similar degenerative appearing anterolisthesis of C4 on C5 with associated advanced facet arthropathy on the left. Skull base and vertebrae: Innumerable scattered sclerotic lesions throughout the skull base and visible spine and ribs. Involvement also the visible right clavicle. The skull base appears to remain  intact. No superimposed cervical spine fracture identified. Soft tissues and spinal canal: No prevertebral fluid or swelling. No visible canal hematoma. Disc levels: Widespread cervical spine degeneration. Severe anterior C1 odontoid degeneration. Severe facet arthropathy at C2-C3 and C4-C5 on the left  superimposed on chronic C3-C4 posterior element ankylosis. Advanced disc and endplate degeneration at C5-C6. Up to mild associated degenerative appearing spinal stenosis. Upper chest: Diffuse small sclerotic lesions. Possible mild T2 superior endplate compression (sagittal image 30). Negative lung apices. IMPRESSION: 1. Innumerable small sclerotic foci in the skeleton compatible with widespread osseous metastatic disease from prostate cancer, new since May. 2. Evidence of small vessel ischemic disease in the brain. No acute intracranial abnormality by CT. 3. No acute traumatic injury identified in the cervical spine. Widespread advanced cervical spine degeneration. Electronically Signed   By: Genevie Ann M.D.   On: 07/31/2019 03:35   Ct Cervical Spine Wo Contrast  Result Date: 07/31/2019 CLINICAL DATA:  78 year old male with left extremity numbness since yesterday. Prostate cancer. EXAM: CT HEAD WITHOUT CONTRAST CT CERVICAL SPINE WITHOUT CONTRAST TECHNIQUE: Multidetector CT imaging of the head and cervical spine was performed following the standard protocol without intravenous contrast. Multiplanar CT image reconstructions of the cervical spine were also generated. COMPARISON:  CT Abdomen and Pelvis and whole-body bone scan 01/15/2019. FINDINGS: CT HEAD FINDINGS Brain: Cerebral volume is within normal limits for age. No midline shift, ventriculomegaly, mass effect, evidence of mass lesion, intracranial hemorrhage or evidence of cortically based acute infarction. Scattered and confluent bilateral cerebral white matter hypodensity. Involvement of the deep gray nuclei, mostly the basal ganglia. No cortical encephalomalacia identified. Vascular: Calcified atherosclerosis at the skull base. No suspicious intracranial vascular hyperdensity. Skull: Numerous small scattered sclerotic foci at the visible skull base and also in the visible vertebrae (series 4, image 4). There is also involvement of the bilateral mandible  condyles. No lytic osseous lesion identified. Sinuses/Orbits: Widespread polypoid paranasal sinus mucosal thickening. Similar polypoid opacity in the visible nasal cavity. Tympanic cavities and mastoids are clear. Other: Visualized orbits and scalp soft tissues are within normal limits. CT CERVICAL SPINE FINDINGS Alignment: Degenerative appearing anterolisthesis of C3 on C4, the posterior elements of which are ankylosed. Similar degenerative appearing anterolisthesis of C4 on C5 with associated advanced facet arthropathy on the left. Skull base and vertebrae: Innumerable scattered sclerotic lesions throughout the skull base and visible spine and ribs. Involvement also the visible right clavicle. The skull base appears to remain intact. No superimposed cervical spine fracture identified. Soft tissues and spinal canal: No prevertebral fluid or swelling. No visible canal hematoma. Disc levels: Widespread cervical spine degeneration. Severe anterior C1 odontoid degeneration. Severe facet arthropathy at C2-C3 and C4-C5 on the left superimposed on chronic C3-C4 posterior element ankylosis. Advanced disc and endplate degeneration at C5-C6. Up to mild associated degenerative appearing spinal stenosis. Upper chest: Diffuse small sclerotic lesions. Possible mild T2 superior endplate compression (sagittal image 30). Negative lung apices. IMPRESSION: 1. Innumerable small sclerotic foci in the skeleton compatible with widespread osseous metastatic disease from prostate cancer, new since May. 2. Evidence of small vessel ischemic disease in the brain. No acute intracranial abnormality by CT. 3. No acute traumatic injury identified in the cervical spine. Widespread advanced cervical spine degeneration. Electronically Signed   By: Genevie Ann M.D.   On: 07/31/2019 03:35   Mr Angio Head Wo Contrast  Result Date: 07/31/2019 CLINICAL DATA:  Focal neuro deficit, greater than 6 hours, stroke suspected. Additional history provided: Left  leg and  arm numbness that began yesterday. EXAM: MRI HEAD WITHOUT CONTRAST MRA HEAD WITHOUT CONTRAST TECHNIQUE: Multiplanar, multiecho pulse sequences of the brain and surrounding structures were obtained without intravenous contrast. Angiographic images of the head were obtained using MRA technique without contrast. COMPARISON:  Noncontrast head CT performed earlier the same day 07/31/2019. FINDINGS: MRI HEAD FINDINGS Brain: Multiple sequences are mildly motion degraded. 8 mm focus of restricted diffusion within the right thalamus consistent with acute lacunar infarct. Corresponding T2/FLAIR hyperintensity at this site. No evidence of intracranial mass. No midline shift or extra-axial fluid collection. There are few scattered chronic microhemorrhages within the bilateral cerebral hemispheres. Background of moderate chronic small vessel ischemic disease. Chronic lacunar infarcts within bilateral cerebral white matter, basal ganglia and thalami. Mild generalized parenchymal atrophy. Vascular: Reported separately. Skull and upper cervical spine: Multiple sclerotic lesions within the skull base and upper cervical spine were better appreciated on CT head/cervical spine performed earlier the same day. Sinuses/Orbits: Visualized orbits demonstrate no acute abnormality. Mild scattered paranasal sinus mucosal thickening. Small inferior right maxillary sinus mucous retention cyst. No significant mastoid effusion. MRA HEAD FINDINGS The examination is significantly motion degraded limiting evaluation for stenoses and small aneurysms. The intracranial internal carotid arteries are patent bilaterally without high-grade stenosis. The M1 right middle cerebral artery is patent without significant stenosis. Motion artifact significantly limits evaluation of the M2 and more distal right MCA branches. The right anterior cerebral artery is patent without high-grade proximal stenosis. The M1 left middle cerebral artery is patent without  significant stenosis. Motion artifact significantly limits evaluation of the M2 and more distal left MCA branches. The left anterior cerebral artery is patent without high-grade proximal stenosis. No intracranial aneurysm is identified. The intracranial vertebral arteries are patent without significant stenosis, as is the basilar artery. The bilateral posterior cerebral arteries are patent without high-grade proximal stenosis. Focal stenosis within the proximal P2 left posterior cerebral artery. Posterior communicating arteries are present bilaterally. IMPRESSION: MRI brain: 1. 8 mm acute lacunar infarct within the right thalamus. 2. Moderate chronic small vessel ischemic disease with multiple chronic lacunar infarcts. 3. Mild generalized parenchymal atrophy. MRA head: 1. Motion degraded examination, limiting evaluation for stenoses and for small aneurysms. 2. No intracranial large vessel occlusion or proximal high-grade arterial stenosis identified. However, please note that motion degradation significantly limits evaluation of the M2 and more distal MCA branches bilaterally. Electronically Signed   By: Kellie Simmering DO   On: 07/31/2019 07:57   Mr Brain Wo Contrast  Result Date: 07/31/2019 CLINICAL DATA:  Focal neuro deficit, greater than 6 hours, stroke suspected. Additional history provided: Left leg and arm numbness that began yesterday. EXAM: MRI HEAD WITHOUT CONTRAST MRA HEAD WITHOUT CONTRAST TECHNIQUE: Multiplanar, multiecho pulse sequences of the brain and surrounding structures were obtained without intravenous contrast. Angiographic images of the head were obtained using MRA technique without contrast. COMPARISON:  Noncontrast head CT performed earlier the same day 07/31/2019. FINDINGS: MRI HEAD FINDINGS Brain: Multiple sequences are mildly motion degraded. 8 mm focus of restricted diffusion within the right thalamus consistent with acute lacunar infarct. Corresponding T2/FLAIR hyperintensity at this  site. No evidence of intracranial mass. No midline shift or extra-axial fluid collection. There are few scattered chronic microhemorrhages within the bilateral cerebral hemispheres. Background of moderate chronic small vessel ischemic disease. Chronic lacunar infarcts within bilateral cerebral white matter, basal ganglia and thalami. Mild generalized parenchymal atrophy. Vascular: Reported separately. Skull and upper cervical spine: Multiple sclerotic lesions within the skull base and upper  cervical spine were better appreciated on CT head/cervical spine performed earlier the same day. Sinuses/Orbits: Visualized orbits demonstrate no acute abnormality. Mild scattered paranasal sinus mucosal thickening. Small inferior right maxillary sinus mucous retention cyst. No significant mastoid effusion. MRA HEAD FINDINGS The examination is significantly motion degraded limiting evaluation for stenoses and small aneurysms. The intracranial internal carotid arteries are patent bilaterally without high-grade stenosis. The M1 right middle cerebral artery is patent without significant stenosis. Motion artifact significantly limits evaluation of the M2 and more distal right MCA branches. The right anterior cerebral artery is patent without high-grade proximal stenosis. The M1 left middle cerebral artery is patent without significant stenosis. Motion artifact significantly limits evaluation of the M2 and more distal left MCA branches. The left anterior cerebral artery is patent without high-grade proximal stenosis. No intracranial aneurysm is identified. The intracranial vertebral arteries are patent without significant stenosis, as is the basilar artery. The bilateral posterior cerebral arteries are patent without high-grade proximal stenosis. Focal stenosis within the proximal P2 left posterior cerebral artery. Posterior communicating arteries are present bilaterally. IMPRESSION: MRI brain: 1. 8 mm acute lacunar infarct within the  right thalamus. 2. Moderate chronic small vessel ischemic disease with multiple chronic lacunar infarcts. 3. Mild generalized parenchymal atrophy. MRA head: 1. Motion degraded examination, limiting evaluation for stenoses and for small aneurysms. 2. No intracranial large vessel occlusion or proximal high-grade arterial stenosis identified. However, please note that motion degradation significantly limits evaluation of the M2 and more distal MCA branches bilaterally. Electronically Signed   By: Kellie Simmering DO   On: 07/31/2019 07:57   US Carotid Bilateral (at [facility] And [facility] Only)  Result Date: 07/31/2019 CLINICAL DATA:  Left arm and leg weakness. Acute thalamic lacunar infarct. EXAM: BILATERAL CAROTID DUPLEX ULTRASOUND TECHNIQUE: Pearline Cables scale imaging, color Doppler and duplex ultrasound were performed of bilateral carotid and vertebral arteries in the neck. COMPARISON:  MRI of the brain on 07/31/2019 FINDINGS: Criteria: Quantification of carotid stenosis is based on velocity parameters that correlate the residual internal carotid diameter with NASCET-based stenosis levels, using the diameter of the distal internal carotid lumen as the denominator for stenosis measurement. The following velocity measurements were obtained: RIGHT ICA:  66/21 cm/sec CCA:  123456 cm/sec SYSTOLIC ICA/CCA RATIO:  0.9 ECA:  66 cm/sec LEFT ICA:  89/23 cm/sec CCA:  123456 cm/sec SYSTOLIC ICA/CCA RATIO:  1.0 ECA:  81 cm/sec RIGHT CAROTID ARTERY: Mild amount of calcified plaque present at the level of the carotid bulb and proximal right ICA. Estimated right ICA stenosis is less than 50%. RIGHT VERTEBRAL ARTERY: Antegrade flow with normal waveform and velocity. LEFT CAROTID ARTERY: Minimal partially calcified plaque at the level of the distal bulb and proximal left ICA. Estimated left ICA stenosis is less than 50%. LEFT VERTEBRAL ARTERY: Antegrade flow with normal waveform and velocity. IMPRESSION: Mild plaque at the level of both carotid bulbs and  proximal internal carotid arteries. No significant carotid stenosis identified with estimated bilateral ICA stenoses of less than 50%. Electronically Signed   By: Aletta Edouard M.D.   On: 07/31/2019 11:49     Discharge Exam: Vitals:   08/01/19 0900 08/01/19 1300  BP: (!) 153/92 124/76  Pulse: 78 76  Resp: 16 20  Temp: 98.1 F (36.7 C) 98.1 F (36.7 C)  SpO2:  96%   Vitals:   08/01/19 0300 08/01/19 0500 08/01/19 0900 08/01/19 1300  BP: (!) 148/94 (!) 146/89 (!) 153/92 124/76  Pulse: 74 79 78 76  Resp: 16  16 16 20   Temp: 97.9 F (36.6 C) 98.7 F (37.1 C) 98.1 F (36.7 C) 98.1 F (36.7 C)  TempSrc: Oral Oral Oral Oral  SpO2: 98% 94%  96%  Weight:      Height:        General: Pt is alert, awake, not in acute distress Cardiovascular: RRR, S1/S2 +, no rubs, no gallops Respiratory: CTA bilaterally, no wheezing, no rhonchi Abdominal: Soft, NT, ND, bowel sounds + Extremities: no edema, no cyanosis    The results of significant diagnostics from this hospitalization (including imaging, microbiology, ancillary and laboratory) are listed below for reference.     Microbiology: Recent Results (from the past 240 hour(s))  SARS CORONAVIRUS 2 (TAT 6-24 HRS) Nasopharyngeal Nasopharyngeal Swab     Status: None   Collection Time: 07/31/19  7:33 AM   Specimen: Nasopharyngeal Swab  Result Value Ref Range Status   SARS Coronavirus 2 NEGATIVE NEGATIVE Final    Comment: (NOTE) SARS-CoV-2 target nucleic acids are NOT DETECTED. The SARS-CoV-2 RNA is generally detectable in upper and lower respiratory specimens during the acute phase of infection. Negative results do not preclude SARS-CoV-2 infection, do not rule out co-infections with other pathogens, and should not be used as the sole basis for treatment or other patient management decisions. Negative results must be combined with clinical observations, patient history, and epidemiological information. The expected result is  Negative. Fact Sheet for Patients: SugarRoll.be Fact Sheet for Healthcare Providers: https://www.woods-mathews.com/ This test is not yet approved or cleared by the [place] FDA and  has been authorized for detection and/or diagnosis of SARS-CoV-2 by FDA under an Emergency Use Authorization (EUA). This EUA will remain  in effect (meaning this test can be used) for the duration of the COVID-19 declaration under Section 56 4(b)(1) of the Act, 21 U.S.C. section 360bbb-3(b)(1), unless the authorization is terminated or revoked sooner. Performed at [facility], [place] [address], [place], [place] [address]      Labs: BNP (last 3 results) No results for input(s): BNP in the last 8760 hours. Basic Metabolic Panel: Recent Labs  Lab 07/31/19 0253 08/01/19 0421  NA 138 140  K 3.0* 3.2*  CL 100 102  CO2 28 27  GLUCOSE 233* 203*  BUN 40* 38*  CREATININE 1.38* 1.30*  CALCIUM 9.6 9.5  MG 1.8  --    Liver Function Tests: Recent Labs  Lab 07/31/19 0253  AST 21  ALT 20  ALKPHOS 59  BILITOT 0.4  PROT 7.2  ALBUMIN 4.1   No results for input(s): LIPASE, AMYLASE in the last 168 hours. No results for input(s): AMMONIA in the last 168 hours. CBC: Recent Labs  Lab 07/31/19 0253 08/01/19 0421  WBC 5.7 5.1  HGB 12.4* 12.3*  HCT 36.6* 36.6*  MCV 96.8 97.3  PLT 255 257   Cardiac Enzymes: No results for input(s): CKTOTAL, CKMB, CKMBINDEX, TROPONINI in the last 168 hours. BNP: Invalid input(s): POCBNP CBG: Recent Labs  Lab 07/31/19 1221 07/31/19 1650 07/31/19 2047 08/01/19 0728 08/01/19 1141  GLUCAP 187* 178* 211* 188* 214*   D-Dimer No results for input(s): DDIMER in the last 72 hours. Hgb A1c Recent Labs    07/31/19 1137  HGBA1C 8.9*   Lipid Profile Recent Labs    08/01/19 0421  CHOL 223*  HDL 60  LDLCALC 136*  TRIG 135  CHOLHDL 3.7   Thyroid function studies Recent Labs    07/31/19 0253  TSH 2.180    Anemia work  up Recent Labs    07/31/19 0253  VITAMINB12 873   Urinalysis No results found for: COLORURINE, APPEARANCEUR, LABSPEC, [place], GLUCOSEU, HGBUR, BILIRUBINUR, KETONESUR, PROTEINUR, UROBILINOGEN, NITRITE, LEUKOCYTESUR Sepsis Labs Invalid input(s): PROCALCITONIN,  WBC,  LACTICIDVEN Microbiology Recent Results (from the past 240 hour(s))  SARS CORONAVIRUS 2 (TAT 6-24 HRS) Nasopharyngeal Nasopharyngeal Swab     Status: None   Collection Time: 07/31/19  7:33 AM   Specimen: Nasopharyngeal Swab  Result Value Ref Range Status   SARS Coronavirus 2 NEGATIVE NEGATIVE Final    Comment: (NOTE) SARS-CoV-2 target nucleic acids are NOT DETECTED. The SARS-CoV-2 RNA is generally detectable in upper and lower respiratory specimens during the acute phase of infection. Negative results do not preclude SARS-CoV-2 infection, do not rule out co-infections with other pathogens, and should not be used as the sole basis for treatment or other patient management decisions. Negative results must be combined with clinical observations, patient history, and epidemiological information. The expected result is Negative. Fact Sheet for Patients: SugarRoll.be Fact Sheet for Healthcare Providers: https://www.woods-mathews.com/ This test is not yet approved or cleared by the [place] FDA and  has been authorized for detection and/or diagnosis of SARS-CoV-2 by FDA under an Emergency Use Authorization (EUA). This EUA will remain  in effect (meaning this test can be used) for the duration of the COVID-19 declaration under Section 56 4(b)(1) of the Act, 21 U.S.C. section 360bbb-3(b)(1), unless the authorization is terminated or revoked sooner. Performed at [facility], [place] [address] [place], [place] [address]      Time coordinating discharge: 35 minutes  SIGNED:   Rodena Goldmann, DO [facility] 08/01/2019, 2:20 PM  If 7PM-7AM,  please contact night-coverage [email or web address]

## 2019-08-01 NOTE — Progress Notes (Signed)
*  PRELIMINARY RESULTS* Echocardiogram 2D Echocardiogram has been performed.  Wayne Perkins 08/01/2019, 9:23 AM

## 2019-08-01 NOTE — Evaluation (Signed)
Physical Therapy Evaluation Patient Details Name: Wayne Perkins MRN: [id number] DOB: 10-03-1940 Today's Date: 08/01/2019   History of Present Illness  Leotha Urtado is a 78 y.o. male with past medical history significant for metastatic prostate cancer, hypertension, type 2 diabetes mellitus with nephropathy, CKD stage 3a and hyperlipidemia; who presented to the emergency department secondary to left-sided numbness.  Patient reports symptom has been present for the last 2 days prior to admission.  He initially noticed numbness on his left leg that suddenly extended to affect his left upper extremity as well.  Symptoms has been persistent, constant over the sudden presentation.  Patient denies frank pain.  No headaches, no chest pain, no palpitations, no nausea, no vomiting, no fever, no chills, no productive cough or shortness of breath; reports no contact with anyone with a positive Covid diagnosis.    Clinical Impression  Patient functioning at baseline for functional mobility and gait.  Plan:  Patient discharged from physical therapy to care of nursing for ambulation daily as tolerated for length of stay.     Follow Up Recommendations No PT follow up    Equipment Recommendations  None recommended by PT    Recommendations for Other Services       Precautions / Restrictions Precautions Precautions: None Restrictions Weight Bearing Restrictions: No      Mobility  Bed Mobility Overal bed mobility: Independent                Transfers Overall transfer level: Independent                  Ambulation/Gait Ambulation/Gait assistance: Modified independent (Device/Increase time) Gait Distance (Feet): 300 Feet Assistive device: None Gait Pattern/deviations: WFL(Within Functional Limits) Gait velocity: slightly decreased   General Gait Details: demonstrates good return for ambulation on level, inclined and declined surfaces without loss of balance  Stairs             Wheelchair Mobility    Modified Rankin (Stroke Patients Only)       Balance Overall balance assessment: No apparent balance deficits (not formally assessed)                                           Pertinent Vitals/Pain Pain Assessment: No/denies pain    Home Living Family/patient expects to be discharged to:: Private residence Living Arrangements: Spouse/significant other Available Help at Discharge: Family;Available 24 hours/day Type of Home: House Home Access: Ramped entrance     Home Layout: One level Home Equipment: Cane - single point;Grab bars - tub/shower      Prior Function Level of Independence: Independent         Comments: Hydrographic surveyor, drives     Hand Dominance   Dominant Hand: Right    Extremity/Trunk Assessment   Upper Extremity Assessment Upper Extremity Assessment: Defer to OT evaluation    Lower Extremity Assessment Lower Extremity Assessment: Overall WFL for tasks assessed    Cervical / Trunk Assessment Cervical / Trunk Assessment: Normal  Communication   Communication: No difficulties  Cognition Arousal/Alertness: Awake/alert Behavior During Therapy: WFL for tasks assessed/performed Overall Cognitive Status: Within Functional Limits for tasks assessed                                        General  Comments      Exercises     Assessment/Plan    PT Assessment Patent does not need any further PT services  PT Problem List         PT Treatment Interventions      PT Goals (Current goals can be found in the Care Plan section)  Acute Rehab PT Goals Patient Stated Goal: return home today PT Goal Formulation: With patient Time For Goal Achievement: 08/01/19 Potential to Achieve Goals: Good    Frequency     Barriers to discharge        Co-evaluation               AM-PAC PT "6 Clicks" Mobility  Outcome Measure Help needed turning from your back to your side while in  a flat bed without using bedrails?: None Help needed moving from lying on your back to sitting on the side of a flat bed without using bedrails?: None Help needed moving to and from a bed to a chair (including a wheelchair)?: None Help needed standing up from a chair using your arms (e.g., wheelchair or bedside chair)?: None Help needed to walk in hospital room?: None Help needed climbing 3-5 steps with a railing? : None 6 Click Score: 24    End of Session   Activity Tolerance: Patient tolerated treatment well Patient left: in bed;with call bell/phone within reach Nurse Communication: Mobility status PT Visit Diagnosis: Unsteadiness on feet (R26.81);Other abnormalities of gait and mobility (R26.89);Muscle weakness (generalized) (M62.81)    Time: [id number] PT Time Calculation (min) (ACUTE ONLY): 23 min   Charges:   PT Evaluation $PT Eval Moderate Complexity: 1 Mod PT Treatments $Therapeutic Activity: 23-37 mins        12:19 PM, 08/01/19 Lonell Grandchild, MPT Physical Therapist with [facility] [phone] office [phone] mobile phone

## 2019-08-01 NOTE — Plan of Care (Signed)
Patient has Stroke early stages of recovery booklet and expressed full understanding along with his wife.

## 2019-08-03 ENCOUNTER — Telehealth: Payer: Self-pay

## 2019-08-03 ENCOUNTER — Ambulatory Visit: Payer: Medicare HMO | Admitting: Family Medicine

## 2019-08-03 NOTE — Telephone Encounter (Signed)
Transition Care Management Follow-up Telephone Call  Date of discharge and from where: 08/01/19 from [facility]  How have you been since you were released from the hospital? Improved  Any questions or concerns? No   Items Reviewed:  Did the pt receive and understand the discharge instructions provided? Yes   Medications obtained and verified? Yes   Any new allergies since your discharge? No   Dietary orders reviewed? Yes  Do you have support at home? Yes   Other (ie: DME, Home Health, etc) n/a  Functional Questionnaire: (I = Independent and D = Dependent) ADL's: I  Bathing/Dressing- I   Meal Prep- I  Eating- I  Maintaining continence- I  Transferring/Ambulation- I  Managing Meds- I   Follow up appointments reviewed:    PCP Hospital f/u appt confirmed? Yes  Scheduled to see Dr. Rogers Blocker on 08/06/19 @ 11.  [facility] f/u appt confirmed? No  Scheduled to see Neurology appointment to be scheduled by that office.   Are transportation arrangements needed? No   If their condition worsens, is the pt aware to call  their PCP or go to the ED? Yes  Was the patient provided with contact information for the PCP's office or ED? Yes  Was the pt encouraged to call back with questions or concerns? Yes

## 2019-08-06 ENCOUNTER — Encounter: Payer: Self-pay | Admitting: Family Medicine

## 2019-08-06 ENCOUNTER — Other Ambulatory Visit: Payer: Self-pay

## 2019-08-06 ENCOUNTER — Ambulatory Visit (INDEPENDENT_AMBULATORY_CARE_PROVIDER_SITE_OTHER): Payer: Medicare HMO | Admitting: Family Medicine

## 2019-08-06 VITALS — BP 136/88 | HR 82 | Temp 97.3°F | Ht 70.0 in | Wt 172.4 lb

## 2019-08-06 DIAGNOSIS — E1165 Type 2 diabetes mellitus with hyperglycemia: Secondary | ICD-10-CM

## 2019-08-06 DIAGNOSIS — E876 Hypokalemia: Secondary | ICD-10-CM

## 2019-08-06 DIAGNOSIS — I639 Cerebral infarction, unspecified: Secondary | ICD-10-CM

## 2019-08-06 DIAGNOSIS — E1122 Type 2 diabetes mellitus with diabetic chronic kidney disease: Secondary | ICD-10-CM

## 2019-08-06 DIAGNOSIS — IMO0002 Reserved for concepts with insufficient information to code with codable children: Secondary | ICD-10-CM

## 2019-08-06 LAB — BASIC METABOLIC PANEL
BUN: 31 mg/dL — ABNORMAL HIGH (ref 6–23)
CO2: 30 mEq/L (ref 19–32)
Calcium: 9.9 mg/dL (ref 8.4–10.5)
Chloride: 99 mEq/L (ref 96–112)
Creatinine, Ser: 1.4 mg/dL (ref 0.40–1.50)
GFR: 48.88 mL/min — ABNORMAL LOW (ref >60.00)
Glucose, Bld: 205 mg/dL — ABNORMAL HIGH (ref 70–99)
Potassium: 3.6 mEq/L (ref 3.5–5.1)
Sodium: 137 mEq/L (ref 135–145)

## 2019-08-06 MED ORDER — LISINOPRIL 2.5 MG PO TABS: 2.5000 mg | ORAL_TABLET | Freq: Every day | ORAL | 3 refills | Status: DC

## 2019-08-06 NOTE — Progress Notes (Addendum)
Patient: Wayne Perkins MRN: [id number] DOB: 1941/01/05 PCP: [name], MD     Subjective:  Chief Complaint  Patient presents with  . Hospitalization Follow-up    HPI: The patient is a 78 y.o. male who presents today for hospitalization follow up for acute ischemic stroke.   Admit: 07/31/2019 Discharge date: 08/01/2019  1) right lacunar CVA with no residual effects. Presented to ER secondary to left sided numbness on his left leg that went into his left upper extremity. symptoms had been persistent and constant. He never had weakness or focal deficits. In ER MRI/MRA demonstrated an 8 mm acute lacunar infarct within the right thalamus; moderate chronic small vessel ischemic disease with multiple chronic lacunar infarcts, mold generalized parencymal atrophy.  Carotid ultrasound showed mild plaque at the level of both carotid bulbs and proximal internal carotid arteries. No significant stenosis identified. Echo of heart showed EF of 60-65%. Grade 1 diastolic dysfunction. No atrial shunting and otherwise normal.   Neurology consulted. Continue ASA/plavix. Continue zetia since can not tolerate zetia. F/u with neuro in 6 weeks.   He has no residual effects form his stroke and is doing well. No complaints.   2) hypokalemia: currently on 40emq daily. Will repeat bmp today. Has been very resistant to increasing this with me in outpatient setting, but seems more onboard with medication change today after his stroke. Last potassium was 3.2.   3) uncontrolled diabetes type 2: a1c was 8.9 in hospital. Is not on any medication. He stopped taking the jardiance due to what he describes as side effects and cost. He never let me know this.  He has been very resistant to treat with anything else despite worsening a1c. Creatinine elevated so can not do metformin. Was on lisinopril unsure why stopped. Has already met with nutrition for counseling.   4) prostate cancer: new bone metastasis seen on mri, new  since may. Has appointment with urology soon.    Review of Systems  Constitutional: Negative for fatigue.  HENT: Negative for congestion, postnasal drip, rhinorrhea and sore throat.   Eyes: Negative for visual disturbance.  Respiratory: Negative for shortness of breath.   Cardiovascular: Negative for chest pain, palpitations and leg swelling.  Gastrointestinal: Negative for abdominal pain, diarrhea, nausea and vomiting.  Skin: Negative for rash.  Neurological: Negative for dizziness and headaches.  Psychiatric/Behavioral: Negative for sleep disturbance.    Allergies Patient is allergic to statins.  Past Medical History Patient  has a past medical history of Arthritis, Blood in stool, Childhood asthma, Heart disease, Hypertension, essential, Prostate cancer ([place]), and Stroke ([place]).  Surgical History Patient  has a past surgical history that includes Ganglion cyst excision (Right) and Coronary angioplasty with stent.  Family History [name]'s family history includes Depression in his brother; Early death in his brother; Heart disease in his brother; Hyperlipidemia in his mother and sister; Hypertension in his mother; Mental illness in his brother; Stroke in his father.  Social History Patient  reports that he has quit smoking. He has never used smokeless tobacco. He reports previous alcohol use. He reports that he does not use drugs.    Objective: Vitals:   08/06/19 1042  BP: 136/88  Pulse: 82  Temp: (!) 97.3 F (36.3 C)  TempSrc: Skin  SpO2: 96%  Weight: 172 lb 6.4 oz (78.2 kg)  Height: '5\' 10"'$  (1.778 m)    Body mass index is 24.74 kg/m.  Physical Exam Vitals signs reviewed.  Constitutional:      Appearance: Normal  appearance. He is normal weight.  HENT:     Head: Normocephalic and atraumatic.  Neck:     Musculoskeletal: Normal range of motion and neck supple.     Vascular: No carotid bruit.  Cardiovascular:     Rate and Rhythm: Normal rate and regular rhythm.      Heart sounds: Normal heart sounds.  Pulmonary:     Effort: Pulmonary effort is normal.     Breath sounds: Normal breath sounds.  Abdominal:     General: Abdomen is flat. Bowel sounds are normal.     Palpations: Abdomen is soft.  Skin:    General: Skin is warm.  Neurological:     General: No focal deficit present.     Mental Status: He is alert and oriented to person, place, and time.     Cranial Nerves: No cranial nerve deficit.     Sensory: No sensory deficit.     Motor: No weakness.     Comments: Strength 5/5 bilaterally in upper and lower extremities. Normal sensation.   Psychiatric:        Mood and Affect: Mood normal.        Behavior: Behavior normal.        Assessment/plan: 1. Acute ischemic stroke (HCC) No residual effects. Reviewed all imaging/hospital notes. Continue asa/plavix and zetia. Has f/u with neuro in 6 weeks. Discussed could be secondary to such poorly controlled diabetes. He seems more keen to get this under control as he has not been compliant with medication nor wanted to increase treatment of his diabetes in the past. Discussed importance of this today.   2. Hypokalemia Continue 49mq. Checking today.  - Basic metabolic panel  3. Uncontrolled type 2 diabetes mellitus with chronic kidney disease, without long-term current use of insulin ([place] Checking on insurance to see if GLP-1 will be covered. Can not take metformin due to renal function. Can not tolerate jardiance. If GLP-1 inhibitors too expensive discussed would need to do insulin. Declines all immunizations. Starting him back on 2.566mdose of lisinopril for nephro protection. Has already seen nutritionist but diet is poor. Stop Gatorade and decrease fruit serving size. Will call me about GLP-1 coverage and we will start him on this if covered. If not, insulin will be initiated. Nurse taught him how to self inject GLP-1 pens today. Hypoglycemic precautions given. Risks of uncontrolled diabetes discussed  as well as compliance.  - CBC with Differential/Platelet; Future - Comprehensive metabolic panel; Future - Hemoglobin A1c; Future   Return in about 3 months (around 11/04/2019) for diabetes/labs before. .  This visit occurred during the SARS-CoV-2 public health emergency.  Safety protocols were in place, including screening questions prior to the visit, additional usage of staff PPE, and extensive cleaning of exam room while observing appropriate contact time as indicated for disinfecting solutions.    AlOrma FlamingMD [place] 08/06/2019

## 2019-08-06 NOTE — Patient Instructions (Signed)
Look into GLP-1 drugs with insurance and then need to let me know what I can order.   If they do not cover these, please ask about what long acting insulin they do cover.   Will see you in 3 months for follow up. Labs before.   Also start back you lisinopril 2.5mg  for renal protection with diabetes.    Merry Christmas! Dr. Rogers Blocker

## 2019-08-07 ENCOUNTER — Telehealth: Payer: Self-pay

## 2019-08-07 NOTE — Telephone Encounter (Signed)
Copied from [place] [id number]. Topic: General - Other >> Aug 07, 2019  1:25 PM Keene Breath wrote: Reason for CRM: Patient's wife called to ask the nurse to call her regarding some insurance and medication information that she has to share.  Please advise and call wife, Enid Derry, back at [phone]

## 2019-08-08 NOTE — Telephone Encounter (Signed)
Called and lm for pt tcb to give [place] below information.

## 2019-08-08 NOTE — Telephone Encounter (Signed)
Pt wife called in stated that she called her ins compy but all the meds are really expensive.  She has some additional question about diet and other solutions,    Best number [phone]

## 2019-08-08 NOTE — Telephone Encounter (Signed)
See note

## 2019-08-09 ENCOUNTER — Other Ambulatory Visit: Payer: Self-pay | Admitting: Family Medicine

## 2019-08-09 MED ORDER — GLIPIZIDE 5 MG PO TABS: 5.0000 mg | ORAL_TABLET | Freq: Two times a day (BID) | ORAL | 1 refills | Status: DC

## 2019-08-09 NOTE — Telephone Encounter (Signed)
His a1c is too high for diet alone to fix. There are some really cheap drugs we can try or we can do insulin. There is a drug called glipizide that I tend to try not to sue due to hypoglycemic events, but it is cheap and does have some a1c lowering benefits. I can send this in for him to try. Any hypoglycemic events make sure and have peanut butter, sugary drink etc. Around to eat/drink to bring this up. See HiM back in 3 months for labs/appointment.  [name], MD [place]

## 2019-08-09 NOTE — Telephone Encounter (Signed)
Spoke to patient's wife and advised of notes per Dr. Rogers Blocker.  Patient's wife is agreeable to him starting the medication but reports that patient will be reluctant to start it given the possible side effects.  Advised per Dr. Rogers Blocker if he declines to start this medication, the only other option at this point is for him to go on insulin which patient's wife states that he has already refused this.  She was inquiring on the cost of the medication and I explained that she would need to contact their pharmacy [facility]) to find out the cost.  She verbalized understanding.

## 2019-08-10 ENCOUNTER — Telehealth: Payer: Self-pay | Admitting: Family Medicine

## 2019-08-10 NOTE — Telephone Encounter (Signed)
-  let them know I sent in glipizide. He can refuse insulin that is his choice, but again, he is just putting himself at risk of another stroke or heart attack. This drug alone will not bring his sugar down where it needs to be. I advise he start this as I would rather him be on something then have another stroke.   [name], MD [place]

## 2019-08-10 NOTE — Telephone Encounter (Signed)
Left vm message for patient's wife requesting a call back

## 2019-08-10 NOTE — Telephone Encounter (Signed)
Spoke to patient's wife and advised of notes per Dr. Rogers Blocker.  She verbalized understanding.

## 2019-08-22 ENCOUNTER — Telehealth: Payer: Self-pay | Admitting: Family Medicine

## 2019-08-22 MED ORDER — GLIPIZIDE 5 MG PO TABS: 5.0000 mg | ORAL_TABLET | Freq: Two times a day (BID) | ORAL | 1 refills | Status: DC

## 2019-08-22 NOTE — Telephone Encounter (Signed)
Sent in as requested.  Wayne Tugwell, MD [place] [facility] [place]   

## 2019-08-22 NOTE — Addendum Note (Signed)
Addended by: [name] on: 08/22/2019 02:52 PM   Modules accepted: Orders

## 2019-08-22 NOTE — Telephone Encounter (Signed)
See note  Copied from [place] [id number]. Topic: General - Other >> Aug 22, 2019  2:28 PM Leward Quan A wrote: Reason for CRM: Patient called to ask Dr Rogers Blocker, request his Rx that was sent to [facility] for glipiZIDE (GLUCOTROL) 5 MG tablet   to be sent to the [id number] - [place], [place] [facility] [address]  Phone:  [phone] Fax:  [phone] have not received from [facility] and is all out. Please call patient at Ph# [phone]

## 2019-08-27 ENCOUNTER — Other Ambulatory Visit: Payer: Self-pay | Admitting: Family Medicine

## 2019-09-06 ENCOUNTER — Other Ambulatory Visit: Payer: Self-pay | Admitting: Family Medicine

## 2019-09-10 ENCOUNTER — Telehealth: Payer: Self-pay | Admitting: Family Medicine

## 2019-09-10 NOTE — Telephone Encounter (Signed)
Let him know the test we use, the a1c looks at the lifespan of a red blood cell and how much glucose is in it, in simple terms. The lifespan of a red blood cell is 120 days. It can be checked at the half life of a red blood cell, so soonest I would check a1c would be 60 days. If he is okay paying out of pocket and it's been 60 days you can order another a1c jen.   Thanks,  Dr. Rogers Blocker

## 2019-09-10 NOTE — Telephone Encounter (Signed)
Patients wife called in and had a few questions about her husbands medications and she would like Jen to call her back.

## 2019-09-10 NOTE — Telephone Encounter (Signed)
Spoke with patient (and patient's wife) and patient states that his blood sugars have been very good when he has tested them after lunch.  Blood sugars are ranging from 75-144) after lunch with taking glipizide and watching his diet.  Patient would like to come in for a lab appointment now this week to have his a1c checked (explained that it was just checked on 12/1 and had to be 91 days in between in order for his insurance to cover) and he states that he will pay out of pocket.  He wants to prove to Dr. Rogers Blocker that he overall feels better on the medication and doesn't feel that he needs the injectable medication.  I did schedule his 3 month follow up for his diabetes for 3/17@ 10:40 am.    Please advised further, thanks

## 2019-09-11 ENCOUNTER — Other Ambulatory Visit: Payer: Self-pay

## 2019-09-11 DIAGNOSIS — E1122 Type 2 diabetes mellitus with diabetic chronic kidney disease: Secondary | ICD-10-CM

## 2019-09-11 DIAGNOSIS — IMO0002 Reserved for concepts with insufficient information to code with codable children: Secondary | ICD-10-CM

## 2019-09-11 NOTE — Telephone Encounter (Signed)
Spoke with patient and advised of notes per Dr. Rogers Blocker, patient verbalized understanding.  Lab appointment scheduled for 2/2 for a1c.  Patient also verbalizes understanding that insurance will not pay for test since it has been less than 91 days since last checked.  He is willing to pay out of pocket for this test.

## 2019-10-02 ENCOUNTER — Other Ambulatory Visit (INDEPENDENT_AMBULATORY_CARE_PROVIDER_SITE_OTHER): Payer: Medicare HMO

## 2019-10-02 ENCOUNTER — Other Ambulatory Visit: Payer: Medicare HMO

## 2019-10-02 ENCOUNTER — Other Ambulatory Visit: Payer: Self-pay

## 2019-10-02 ENCOUNTER — Telehealth: Payer: Self-pay | Admitting: Family Medicine

## 2019-10-02 DIAGNOSIS — IMO0002 Reserved for concepts with insufficient information to code with codable children: Secondary | ICD-10-CM

## 2019-10-02 DIAGNOSIS — E1165 Type 2 diabetes mellitus with hyperglycemia: Secondary | ICD-10-CM

## 2019-10-02 DIAGNOSIS — E1122 Type 2 diabetes mellitus with diabetic chronic kidney disease: Secondary | ICD-10-CM | POA: Diagnosis not present

## 2019-10-02 NOTE — Telephone Encounter (Signed)
FYI; Medication was added to his allergy list.

## 2019-10-02 NOTE — Telephone Encounter (Signed)
Pt dropped off copies of his blood sugar readings over a series of days. Pt also wanted to talk to Dr. Rogers Blocker about the Clipizide he was prescribed. Pt stated it made his body itch and stopped taking it. Please advise.

## 2019-10-03 LAB — HEMOGLOBIN A1C
Hgb A1c MFr Bld: 8.3 % of total Hgb — ABNORMAL HIGH (ref <5.7)
Mean Plasma Glucose: 192 (calc)
eAG (mmol/L): 10.6 (calc)

## 2019-10-04 NOTE — Telephone Encounter (Signed)
Please see result note. His a1c is not much improved and still out of control at 8.3. since he can not tolerate glipizide, we need to see what drugs his insurance will cover. He has to be on medication and we have to get this under control as he already has had a stroke recently. His wife did call insurance so please let me know what drugs we can do and if none, we may have to go to insulin if that is the only affordable option.   [name], MD [place]

## 2019-10-05 ENCOUNTER — Other Ambulatory Visit: Payer: Self-pay | Admitting: Family Medicine

## 2019-10-05 ENCOUNTER — Ambulatory Visit (INDEPENDENT_AMBULATORY_CARE_PROVIDER_SITE_OTHER): Payer: Medicare HMO | Admitting: Family Medicine

## 2019-10-05 ENCOUNTER — Other Ambulatory Visit: Payer: Self-pay

## 2019-10-05 ENCOUNTER — Encounter: Payer: Self-pay | Admitting: Family Medicine

## 2019-10-05 VITALS — BP 130/80 | HR 67 | Temp 97.8°F | Ht 70.0 in | Wt 173.8 lb

## 2019-10-05 DIAGNOSIS — E1165 Type 2 diabetes mellitus with hyperglycemia: Secondary | ICD-10-CM

## 2019-10-05 DIAGNOSIS — E1122 Type 2 diabetes mellitus with diabetic chronic kidney disease: Secondary | ICD-10-CM

## 2019-10-05 DIAGNOSIS — IMO0002 Reserved for concepts with insufficient information to code with codable children: Secondary | ICD-10-CM

## 2019-10-05 LAB — GLUCOSE, POCT (MANUAL RESULT ENTRY): POC Glucose: 171 mg/dl — AB (ref 70–99)

## 2019-10-05 MED ORDER — BETAMETHASONE DIPROPIONATE 0.05 % EX CREA: TOPICAL_CREAM | Freq: Two times a day (BID) | CUTANEOUS | 1 refills | Status: DC

## 2019-10-05 NOTE — Telephone Encounter (Signed)
Pt called stating he checked on drug Tradjenta at [facility] in [place]. Pt states he would like a 5mg  for 30 day supply. Pt states it is $45. Pt said he called [employer] and they do cover the prescription. Please advise.

## 2019-10-05 NOTE — Telephone Encounter (Signed)
Please Advise okay to send in new medication?

## 2019-10-05 NOTE — Patient Instructions (Addendum)
We have very limited options left for drugs for diabetes.  Please call insurance and see if they cover tradjenta or [place]. This is a DPP-4 inhibitor drug. Again, won't bring a1c down much, but maybe we can get you less than 8.0.   Call me and let me know if they cover this and we can try this for 3 months.   Follow up in 3 months!

## 2019-10-05 NOTE — Progress Notes (Signed)
Patient: Wayne Perkins MRN: [id number] DOB: 1940/11/14 PCP: [name], MD     Subjective:  Chief Complaint  Patient presents with  . Diabetes    Follow up  . Medication Refill    Betamethasone cream    HPI: The patient is a 79 y.o. male who presents today for diabetes follow up. I just had his a1c checked as he thought his sugar was so well controlled. He has been very hesitant to use medication for his uncontrolled diabetes despite recently having a stroke. His recent a1c was 8.3 which is down from 8.9. he took glipizide from 12/30-1/20. His home sugar readings are average of 140's, however discussed 8.3 correlates with around 190 average blood sugar. Did not bring glucometer. No hypo events. We have discussed weekly injectable medication, which would be affordable since first of year, but he would like to stay away from this if he could.   Needs refill of his psoriasis medication cream.   Review of Systems  Constitutional: Negative for chills, fever and unexpected weight change.  Eyes: Negative for visual disturbance.  Respiratory: Negative for cough, shortness of breath and wheezing.   Cardiovascular: Negative for chest pain, palpitations and leg swelling.  Gastrointestinal: Negative for abdominal pain, diarrhea, nausea and vomiting.  Musculoskeletal: Positive for arthralgias. Negative for gait problem.  Neurological: Negative for dizziness, facial asymmetry, weakness, light-headedness and headaches.  Psychiatric/Behavioral: Positive for sleep disturbance. Negative for suicidal ideas.    Allergies Patient is allergic to glipizide and statins.  Past Medical History Patient  has a past medical history of Arthritis, Blood in stool, Childhood asthma, Heart disease, Hypertension, essential, Prostate cancer ([place]), and Stroke ([place]).  Surgical History Patient  has a past surgical history that includes Ganglion cyst excision (Right) and Coronary angioplasty with stent.  Family  History [name]'s family history includes Depression in his brother; Early death in his brother; Heart disease in his brother; Hyperlipidemia in his mother and sister; Hypertension in his mother; Mental illness in his brother; Stroke in his father.  Social History Patient  reports that he has quit smoking. He has never used smokeless tobacco. He reports previous alcohol use. He reports that he does not use drugs.    Objective: Vitals:   10/05/19 1121  BP: 130/80  Pulse: 67  Temp: 97.8 F (36.6 C)  TempSrc: Temporal  SpO2: 93%  Weight: 173 lb 12.8 oz (78.8 kg)  Height: 5\' 10"  (1.778 m)    Body mass index is 24.94 kg/m.  Physical Exam Vitals reviewed.  Constitutional:      Appearance: Normal appearance. He is normal weight.  HENT:     Head: Normocephalic and atraumatic.  Cardiovascular:     Rate and Rhythm: Normal rate and regular rhythm.     Heart sounds: Normal heart sounds.  Pulmonary:     Effort: Pulmonary effort is normal.     Breath sounds: Normal breath sounds.  Abdominal:     General: Abdomen is flat. Bowel sounds are normal.     Palpations: Abdomen is soft.  Neurological:     General: No focal deficit present.     Mental Status: He is alert and oriented to person, place, and time.  Psychiatric:        Mood and Affect: Mood normal.        Behavior: Behavior normal.        Assessment/plan: 1. Uncontrolled type 2 diabetes mellitus with chronic kidney disease, without long-term current use of insulin (HCC) a1c improved,  but still above goal. I do not think his glucometer is calibrated correctly either based off our reading today. He has intolerance to most oral medication. We are going to try to see if his insurance covers a DPP-4 drug and gave this information for him to call insurance. He will let me know. Discussed if not, we will proceed to weekly injectable even though he would like to stay away from this. Bring glucometer to next office visit as well to make  sure calibrated. Continue low sugar diet. Hypoglycemic precautions given, . - POCT Glucose (CBG)  Total time of encounter: 30 minutes total time of encounter, including 20 minutes spent in face-to-face patient care. This time includes coordination of care and counseling regarding diabetes management. Remainder of non-face-to-face time involved reviewing chart documents/testing relevant to the patient encounter and documentation in the medical record.  This visit occurred during the SARS-CoV-2 public health emergency.  Safety protocols were in place, including screening questions prior to the visit, additional usage of staff PPE, and extensive cleaning of exam room while observing appropriate contact time as indicated for disinfecting solutions.     Return in about 3 months (around 01/02/2020) for diabetes/labs-bring glucometer! [name], MD [place]   10/05/2019

## 2019-10-08 MED ORDER — LINAGLIPTIN 5 MG PO TABS: 5.0000 mg | ORAL_TABLET | Freq: Every day | ORAL | 3 refills | Status: DC

## 2019-10-09 ENCOUNTER — Telehealth: Payer: Self-pay

## 2019-10-09 NOTE — Telephone Encounter (Signed)
error 

## 2019-10-19 ENCOUNTER — Other Ambulatory Visit: Payer: Self-pay | Admitting: Family Medicine

## 2019-10-19 ENCOUNTER — Telehealth: Payer: Self-pay | Admitting: Family Medicine

## 2019-10-19 MED ORDER — TRULICITY 0.75 MG/0.5ML ~~LOC~~ SOAJ: 0.7500 mg | SUBCUTANEOUS | 1 refills | Status: DC

## 2019-10-19 NOTE — Telephone Encounter (Signed)
Yes, this sounds great. Only contraindication for this drug is history of medullary thyroid cancer or family history of this. Or MEN2 disease. I know he does not have this but just check family history.  There is a potential risk of medullary thyroid cancer with this drug and he is to let me know if any troubles swallowing, neck mass or hoarseness. I have never seen this, but have to discuss. I sent in medication for them. He will start at .75mg  [place] weekly and then increase to 1.5mg  [place] after 2 weeks. We will recheck a1c in 3 months time.   Do they need to come in for a nurse visit on how to use injection?   [name], MD [place]

## 2019-10-19 NOTE — Telephone Encounter (Signed)
Please Advise

## 2019-10-19 NOTE — Telephone Encounter (Signed)
Patients wife called in and stated that her husband didn't want to take the pills anymore that he wanted to start taking the shot she said that her insurance would cover trulicity. Patient wife would like a call back to discuss to make sure this is best for husband.

## 2019-10-19 NOTE — Telephone Encounter (Signed)
Just have him stay on .75mg  weekly for 3 months and then we can adjust if needed at next visit. Can you clarify if he stopped his tradjenta?  Dr. Rogers Blocker

## 2019-10-22 ENCOUNTER — Telehealth: Payer: Self-pay

## 2019-10-22 NOTE — Telephone Encounter (Signed)
Returned pt's call. Explained that we have been playing phone tag. I have called him back in between every pt during clinic. Pt is upset because he says that he has been itching. He is interested in administering injections for himself.   Please Advise.

## 2019-10-22 NOTE — Telephone Encounter (Signed)
Patient returning missed called.

## 2019-10-22 NOTE — Telephone Encounter (Signed)
Spoke with the patient and gave message. He has stopped the Tradjenta.  He will call to schedule the Nurse Visit once he receives the Trulicity pens.

## 2019-10-22 NOTE — Telephone Encounter (Signed)
Patient is following up on a missed called. Patient said he is in pain and he need his medication . Patient states that he's been on a phone call since Friday.

## 2019-10-22 NOTE — Telephone Encounter (Signed)
LVM for patient to call the office back. 

## 2019-10-22 NOTE — Telephone Encounter (Signed)
Please call with message [name], MD [place]

## 2019-10-22 NOTE — Telephone Encounter (Signed)
Pt called very upset and stated he no longer wants to see Dr. Rogers Blocker.

## 2019-10-24 ENCOUNTER — Other Ambulatory Visit: Payer: Self-pay

## 2019-10-24 ENCOUNTER — Telehealth: Payer: Self-pay | Admitting: Family Medicine

## 2019-10-24 ENCOUNTER — Other Ambulatory Visit: Payer: Self-pay | Admitting: Family Medicine

## 2019-10-24 MED ORDER — GLUCOSE BLOOD VI STRP: ORAL_STRIP | 3 refills | Status: DC

## 2019-10-24 MED ORDER — HYDROXYZINE HCL 10 MG PO TABS: 10.0000 mg | ORAL_TABLET | Freq: Three times a day (TID) | ORAL | 0 refills | Status: DC | PRN

## 2019-10-24 MED ORDER — EZ SMART BLOOD GLUCOSE LANCETS MISC: 3 refills | Status: DC

## 2019-10-24 NOTE — Progress Notes (Signed)
Sent to Pharmacy. 

## 2019-10-24 NOTE — Telephone Encounter (Signed)
  LAST APPOINTMENT DATE: 10/22/2019   NEXT APPOINTMENT DATE:@3 /17/2021  MEDICATION: Test Strips and supplies   [place]  **Let patient know to contact pharmacy at the end of the day to make sure medication is ready. **  ** Please notify patient to allow 48-72 hours to process**  **Encourage patient to contact the pharmacy for refills or they can request refills through [facility]**  CLINICAL FILLS OUT ALL BELOW:   LAST REFILL:  QTY:  REFILL DATE:    OTHER COMMENTS:    Okay for refill?  Please advise

## 2019-10-24 NOTE — Telephone Encounter (Signed)
Im not sure why he is itching. Would start with taking a zyrtec daily and making sure using a really good emollient cream like cetaphil as dry skin can cause itching. I can also send in a medication to help with itching, but can make him drowsy. Will need to be very careful while taking this. It is called hydroxyzine. Can take up to three times a day as needed for itching. If not getting better, he will need to be seen please  [name], MD [place]

## 2019-10-24 NOTE — Telephone Encounter (Signed)
LVM for patient to call the office back. 

## 2019-10-24 NOTE — Telephone Encounter (Signed)
Spoke with the patient to give message below. Pt voiced understanding.

## 2019-10-24 NOTE — Telephone Encounter (Signed)
Patient returning call.

## 2019-10-24 NOTE — Telephone Encounter (Signed)
Sent to pharmacy 

## 2019-10-25 ENCOUNTER — Telehealth: Payer: Self-pay

## 2019-10-25 NOTE — Telephone Encounter (Signed)
Patient wife requesting to speak to Dr. Rogers Blocker or [facility] regarding Wayne Perkins. Patient states [employer] will not  fill prescription until Dr. Rogers Blocker call them   Please return patient call

## 2019-10-26 NOTE — Telephone Encounter (Signed)
Before I do the PA, they need to see if Wayne Perkins covers or prefers another GLP-1 drug otherwise they will not approve this. And.. I have not even received a PA from the pharmacy at this point.  Do they cover victoza, ozempic, bydureon?  Thanks,  Dr. Rogers Blocker

## 2019-10-26 NOTE — Telephone Encounter (Signed)
Please return patient call as soon as possible. Patient wife is calling again regarding a prior authorization regarding medcation that [facility] will not refill medication  because they are waiting on Dr. Rogers Blocker to approved the prior authorization. Patient wife calling in this morning to provide the authorization number [id number]. The name of the medication that she is calling about is Dulaglutide (TRULICITY) [id number] SOPN

## 2019-10-29 MED ORDER — OZEMPIC (0.25 OR 0.5 MG/DOSE) 2 MG/1.5ML ~~LOC~~ SOPN: 0.2500 mg | PEN_INJECTOR | SUBCUTANEOUS | 0 refills | Status: DC

## 2019-10-29 NOTE — Addendum Note (Signed)
Addended by: [name] on: 10/29/2019 01:19 PM   Modules accepted: Orders

## 2019-10-29 NOTE — Telephone Encounter (Signed)
Sending in ozempic as covered by plan. Trulicity is not covered.  [name], MD [place]

## 2019-10-31 ENCOUNTER — Other Ambulatory Visit: Payer: Self-pay

## 2019-10-31 ENCOUNTER — Ambulatory Visit: Payer: Medicare HMO

## 2019-10-31 DIAGNOSIS — E1121 Type 2 diabetes mellitus with diabetic nephropathy: Secondary | ICD-10-CM

## 2019-10-31 NOTE — Progress Notes (Addendum)
Pt came in the office today for Diabetic (Trulicity) teaching. I showed pt how to use the pen. Pt showed return demonstration and verbalized understanding. Denies any further questions at this time.

## 2019-11-01 ENCOUNTER — Telehealth: Payer: Self-pay | Admitting: Family Medicine

## 2019-11-01 NOTE — Telephone Encounter (Signed)
Pt wife called stating pt was told by [facility] he qualifies for a new diabetic check meter. [employer] needs request from provider for the meter - OccuCheck Aviva+ Meter. Please fax request to [phone]. Wife asked for nurse to call her.

## 2019-11-02 MED ORDER — BLOOD GLUCOSE METER KIT: PACK | 0 refills | Status: DC

## 2019-11-02 NOTE — Telephone Encounter (Signed)
Please let wife know I have faxed requested meter to number given. Also let her know I don't really want him checking sugars since he is not on insulin. He can't correct high sugars so there is no point unless he feels bad and we need to check for low or he just wants to know what his fasting sugars are running.   [name], MD [place]

## 2019-11-02 NOTE — Telephone Encounter (Signed)
Per DPE, left detailed message on VM

## 2019-11-14 ENCOUNTER — Ambulatory Visit: Payer: Medicare HMO | Admitting: Family Medicine

## 2019-12-10 ENCOUNTER — Other Ambulatory Visit: Payer: Self-pay | Admitting: Family Medicine

## 2019-12-10 ENCOUNTER — Other Ambulatory Visit: Payer: Self-pay

## 2019-12-10 NOTE — Telephone Encounter (Signed)
Pt requesting Potassium Chloride ER MEQ  #180 #0 refills  LOV: 10/05/2019 Next Visit: 02/14/2020  Approve?

## 2019-12-14 ENCOUNTER — Telehealth: Payer: Self-pay | Admitting: Family Medicine

## 2019-12-14 NOTE — Telephone Encounter (Signed)
I left a message asking the patient and spouse to call and schedule Medicare AWV with Loma Sousa ([place]).  If patient calls back, please schedule Medicare Wellness Visit at next available opening. Last AWV 01/24/2019 [facility] calendar year)

## 2019-12-21 ENCOUNTER — Telehealth: Payer: Self-pay | Admitting: Family Medicine

## 2019-12-21 ENCOUNTER — Other Ambulatory Visit: Payer: Self-pay

## 2019-12-21 MED ORDER — HYDROXYZINE HCL 10 MG PO TABS: 10.0000 mg | ORAL_TABLET | Freq: Three times a day (TID) | ORAL | 0 refills | Status: DC | PRN

## 2019-12-21 NOTE — Telephone Encounter (Signed)
  LAST APPOINTMENT DATE: 10/31/2019   NEXT APPOINTMENT DATE:@6 /17/2021  MEDICATION:hydrOXYzine (ATARAX/VISTARIL) 10 MG tablet  [facility]ACY[id number] [place] [place] [address]  COMMENTS: States the itching is coming back slowly and wanted to know if he could get another refill to help.   **Let patient know to contact pharmacy at the end of the day to make sure medication is ready. **  ** Please notify patient to allow 48-72 hours to process**  **Encourage patient to contact the pharmacy for refills or they can request refills through [facility]**  CLINICAL FILLS OUT ALL BELOW:   LAST REFILL:  QTY:  REFILL DATE:    OTHER COMMENTS:    Okay for refill?  Please advise

## 2019-12-21 NOTE — Telephone Encounter (Signed)
Refill sent to pharmacy [facility].

## 2019-12-25 ENCOUNTER — Telehealth: Payer: Self-pay | Admitting: Family Medicine

## 2019-12-25 NOTE — Telephone Encounter (Signed)
Patient's wife is calling saying he is on Trulicity and insurance is saying it will be $600-$700 and they sent a fax about it but Enid Derry would like to speak with someone before Dr.Wolfe fills out the fax.

## 2019-12-26 NOTE — Telephone Encounter (Signed)
Patient's wife is calling back wanting to speak to [facility].

## 2019-12-26 NOTE — Telephone Encounter (Signed)
Spoke with Pt's wife Enid Derry),  she says that she spoke with [facility] on Monday to see if there was something that they could do. Pt would rather take this medication, because it has really helped him. She says a fax was sent over Monday. I will touch basis with her Insurance company and follow up some time today.

## 2019-12-26 NOTE — Telephone Encounter (Signed)
Are there any other GLP-1 drugs that their medicare covers? Do they have drug coverage? We can also see about calling the drug rep to see about patient assistance plans.   Dr. Rogers Blocker

## 2019-12-27 NOTE — Telephone Encounter (Signed)
Spoke with Pt and pt's wife to verify drug coverage. They state that GLP1 medications have been too expensive, and cannot afford them out of pocket. Pt was made aware of recommendation by Dr Rogers Blocker to go on daily insulin. Pt refused. Pt would like to recheck [id number] to see if Trulicity is helping. Labs are shy of 3 months. I advised pt to make an app to discuss in office.

## 2019-12-31 ENCOUNTER — Ambulatory Visit (INDEPENDENT_AMBULATORY_CARE_PROVIDER_SITE_OTHER): Payer: Medicare HMO | Admitting: Family Medicine

## 2019-12-31 ENCOUNTER — Other Ambulatory Visit: Payer: Self-pay

## 2019-12-31 ENCOUNTER — Encounter: Payer: Self-pay | Admitting: Family Medicine

## 2019-12-31 VITALS — BP 140/80 | HR 81 | Temp 97.6°F | Ht 70.0 in | Wt 171.2 lb

## 2019-12-31 DIAGNOSIS — I10 Essential (primary) hypertension: Secondary | ICD-10-CM

## 2019-12-31 DIAGNOSIS — E1121 Type 2 diabetes mellitus with diabetic nephropathy: Secondary | ICD-10-CM

## 2019-12-31 NOTE — Progress Notes (Signed)
Patient: Wayne Perkins MRN: [id number] DOB: 1941/02/17 PCP: [name], MD     Subjective:  Chief Complaint  Patient presents with  . Diabetes    HPI: The patient is a 79 y.o. male who presents today for Diabetes   Diabetes Patient is here for follow up of type 2 diabetes. First diagnosed 2019.  Currently on the following medications trulicity. Takes medications as prescribed. Last A1C was 6.4!! Currently not exercising and following diabetic diet. Sugars range from 90 to 120. Denies any hypoglycemic events. Denies any vision changes, nausea, vomiting, abdominal pain, ulcers/paraesthesia in feet, polyuria, polydipsia or polyphagia. Denies any chest pain, shortness of breath. He is so happy with this medication. First time we have had him controlled. He is in the donut hole with his medicare and now trulicity is very expensive. He is determined to stay on this drug because it works so well for him. It looks like all of the GLP1 drugs have the same issue with his medicare coverage.    . Review of Systems  Constitutional: Negative for chills, fatigue and fever.  Respiratory: Negative for cough, shortness of breath and wheezing.   Cardiovascular: Negative for chest pain and palpitations.  Neurological: Negative for dizziness, light-headedness and headaches.    Allergies Patient is allergic to glipizide and statins.  Past Medical History Patient  has a past medical history of Arthritis, Blood in stool, Childhood asthma, Heart disease, Hypertension, essential, Prostate cancer ([place]), and Stroke ([place]).  Surgical History Patient  has a past surgical history that includes Ganglion cyst excision (Right) and Coronary angioplasty with stent.  Family History [name]'s family history includes Depression in his brother; Early death in his brother; Heart disease in his brother; Hyperlipidemia in his mother and sister; Hypertension in his mother; Mental illness in his brother; Stroke in his  father.  Social History Patient  reports that he has quit smoking. He has never used smokeless tobacco. He reports previous alcohol use. He reports that he does not use drugs.    Objective: Vitals:   12/31/19 1425 12/31/19 1459  BP: (!) 168/82 140/80  Pulse: 81   Temp: 97.6 F (36.4 C)   TempSrc: Temporal   SpO2: 93%   Weight: 171 lb 3.2 oz (77.7 kg)   Height: 5\' 10"  (1.778 m)     Body mass index is 24.56 kg/m.  Physical Exam Vitals reviewed.  Constitutional:      Appearance: He is well-developed.  HENT:     Right Ear: External ear normal.     Left Ear: External ear normal.  Eyes:     Conjunctiva/sclera: Conjunctivae normal.     Pupils: Pupils are equal, round, and reactive to light.  Neck:     Thyroid: No thyromegaly.  Cardiovascular:     Rate and Rhythm: Normal rate and regular rhythm.     Heart sounds: Normal heart sounds. No murmur.  Pulmonary:     Effort: Pulmonary effort is normal.     Breath sounds: Normal breath sounds.  Abdominal:     General: Bowel sounds are normal. There is no distension.     Palpations: Abdomen is soft.     Tenderness: There is no abdominal tenderness.  Musculoskeletal:     Cervical back: Normal range of motion and neck supple.  Lymphadenopathy:     Cervical: No cervical adenopathy.  Skin:    General: Skin is warm and dry.     Findings: No rash.  Neurological:     Mental  Status: He is alert and oriented to person, place, and time.     Cranial Nerves: No cranial nerve deficit.     Coordination: Coordination normal.     Deep Tendon Reflexes: Reflexes normal.  Psychiatric:        Behavior: Behavior normal.         A1c: 6.4  Assessment/plan: 1. Type 2 diabetes with nephropathy ([place]) EXCELLENT control. Very proud of him. Sample given of trulicity and advised his wife to call medicare to see if more affordable GLP-1, but I have a hunch if in donut there is not much we can do. They are on a fixed income and have reached out to  drug rep about help with patient assistance for trulicity as well. I will be in touch with them, but overall he is doing wonderful on monotherapy alone with this drug. Can not tolerate jardiance or glipizide due to side effects. Bumped creatinine so metformin was held. Really encouraged he is doing so well on this, so will try to continue current therapy. F/u in 3 months.  - Comprehensive metabolic panel - CBC with Differential/Platelet  2. Hypertension, essential Much better on repeat. Continue current medication. F/u in 3 months time.     This visit occurred during the SARS-CoV-2 public health emergency.  Safety protocols were in place, including screening questions prior to the visit, additional usage of staff PPE, and extensive cleaning of exam room while observing appropriate contact time as indicated for disinfecting solutions.     Return in about 3 months (around 04/01/2020) for routine diabetes follow up/htn .   [name], MD [place]   12/31/2019

## 2019-12-31 NOTE — Patient Instructions (Signed)
Call insurance and let them know that I want to keep you on the drug class that trulicity is in. This is called a GLP-1 inhibitor drug. See if they cover any other of these drugs in this class   1) victoza 2) ozempic 3) bydureon 4) rybelsus   These are all the same drugs as trulicity.   - I would call urologist and see if you could stop the trulicity and repeat your urine in one months time since side effect can cause blood in urine.   A1c is 6.4!!!!!!  whoo hooo!!!   So proud of you!

## 2020-01-01 LAB — COMPREHENSIVE METABOLIC PANEL
ALT: 11 U/L (ref 0–53)
AST: 16 U/L (ref 0–37)
Albumin: 4.3 g/dL (ref 3.5–5.2)
Alkaline Phosphatase: 49 U/L (ref 39–117)
BUN: 42 mg/dL — ABNORMAL HIGH (ref 6–23)
CO2: 29 mEq/L (ref 19–32)
Calcium: 9.9 mg/dL (ref 8.4–10.5)
Chloride: 101 mEq/L (ref 96–112)
Creatinine, Ser: 1.64 mg/dL — ABNORMAL HIGH (ref 0.40–1.50)
GFR: 40.68 mL/min — ABNORMAL LOW (ref >60.00)
Glucose, Bld: 118 mg/dL — ABNORMAL HIGH (ref 70–99)
Potassium: 3.1 mEq/L — ABNORMAL LOW (ref 3.5–5.1)
Sodium: 141 mEq/L (ref 135–145)
Total Bilirubin: 0.4 mg/dL (ref 0.2–1.2)
Total Protein: 6.6 g/dL (ref 6.0–8.3)

## 2020-01-01 LAB — CBC WITH DIFFERENTIAL/PLATELET
Basophils Absolute: 0.1 10*3/uL (ref 0.0–0.1)
Basophils Relative: 1.2 % (ref 0.0–3.0)
Eosinophils Absolute: 0.5 10*3/uL (ref 0.0–0.7)
Eosinophils Relative: 9.8 % — ABNORMAL HIGH (ref 0.0–5.0)
HCT: 34 % — ABNORMAL LOW (ref 39.0–52.0)
Hemoglobin: 11.8 g/dL — ABNORMAL LOW (ref 13.0–17.0)
Lymphocytes Relative: 20.2 % (ref 12.0–46.0)
Lymphs Abs: 1 10*3/uL (ref 0.7–4.0)
MCHC: 34.6 g/dL (ref 30.0–36.0)
MCV: 97.3 fl (ref 78.0–100.0)
Monocytes Absolute: 0.5 10*3/uL (ref 0.1–1.0)
Monocytes Relative: 9.8 % (ref 3.0–12.0)
Neutro Abs: 2.8 10*3/uL (ref 1.4–7.7)
Neutrophils Relative %: 59 % (ref 43.0–77.0)
Platelets: 242 10*3/uL (ref 150.0–400.0)
RBC: 3.49 Mil/uL — ABNORMAL LOW (ref 4.22–5.81)
RDW: 13.5 % (ref 11.5–15.5)
WBC: 4.7 10*3/uL (ref 4.0–10.5)

## 2020-01-02 ENCOUNTER — Encounter: Payer: Self-pay | Admitting: Family Medicine

## 2020-01-29 DIAGNOSIS — R3121 Asymptomatic microscopic hematuria: Secondary | ICD-10-CM | POA: Diagnosis not present

## 2020-01-29 DIAGNOSIS — N200 Calculus of kidney: Secondary | ICD-10-CM | POA: Diagnosis not present

## 2020-02-01 ENCOUNTER — Other Ambulatory Visit: Payer: Self-pay | Admitting: Family Medicine

## 2020-02-01 ENCOUNTER — Other Ambulatory Visit: Payer: Self-pay | Admitting: Urology

## 2020-02-01 ENCOUNTER — Other Ambulatory Visit (HOSPITAL_COMMUNITY): Payer: Self-pay | Admitting: Urology

## 2020-02-01 DIAGNOSIS — C61 Malignant neoplasm of prostate: Secondary | ICD-10-CM

## 2020-02-04 ENCOUNTER — Other Ambulatory Visit: Payer: Self-pay

## 2020-02-04 ENCOUNTER — Other Ambulatory Visit (INDEPENDENT_AMBULATORY_CARE_PROVIDER_SITE_OTHER): Payer: Medicare HMO

## 2020-02-04 ENCOUNTER — Other Ambulatory Visit: Payer: Self-pay | Admitting: Family Medicine

## 2020-02-04 DIAGNOSIS — E876 Hypokalemia: Secondary | ICD-10-CM

## 2020-02-04 LAB — BASIC METABOLIC PANEL
BUN: 35 mg/dL — ABNORMAL HIGH (ref 6–23)
CO2: 30 mEq/L (ref 19–32)
Calcium: 9.8 mg/dL (ref 8.4–10.5)
Chloride: 102 mEq/L (ref 96–112)
Creatinine, Ser: 1.52 mg/dL — ABNORMAL HIGH (ref 0.40–1.50)
GFR: 44.4 mL/min — ABNORMAL LOW (ref >60.00)
Glucose, Bld: 113 mg/dL — ABNORMAL HIGH (ref 70–99)
Potassium: 4 mEq/L (ref 3.5–5.1)
Sodium: 139 mEq/L (ref 135–145)

## 2020-02-08 ENCOUNTER — Encounter: Payer: Self-pay | Admitting: Medical Oncology

## 2020-02-08 NOTE — Progress Notes (Signed)
Left message with patient requesting a return all to discuss referral to the [facility] 6/22.

## 2020-02-11 ENCOUNTER — Encounter: Payer: Self-pay | Admitting: Medical Oncology

## 2020-02-14 ENCOUNTER — Ambulatory Visit (INDEPENDENT_AMBULATORY_CARE_PROVIDER_SITE_OTHER): Payer: Medicare HMO | Admitting: Family Medicine

## 2020-02-14 ENCOUNTER — Encounter: Payer: Self-pay | Admitting: Family Medicine

## 2020-02-14 ENCOUNTER — Other Ambulatory Visit: Payer: Self-pay

## 2020-02-14 VITALS — BP 134/80 | HR 71 | Temp 97.9°F | Ht 70.0 in | Wt 168.8 lb

## 2020-02-14 DIAGNOSIS — E782 Mixed hyperlipidemia: Secondary | ICD-10-CM | POA: Diagnosis not present

## 2020-02-14 DIAGNOSIS — Z1159 Encounter for screening for other viral diseases: Secondary | ICD-10-CM | POA: Diagnosis not present

## 2020-02-14 DIAGNOSIS — E1121 Type 2 diabetes mellitus with diabetic nephropathy: Secondary | ICD-10-CM | POA: Diagnosis not present

## 2020-02-14 DIAGNOSIS — Z0000 Encounter for general adult medical examination without abnormal findings: Secondary | ICD-10-CM | POA: Diagnosis not present

## 2020-02-14 DIAGNOSIS — I10 Essential (primary) hypertension: Secondary | ICD-10-CM

## 2020-02-14 NOTE — Patient Instructions (Signed)
Health Maintenance  Topic Date Due  . Hepatitis C Screening  Never done  . FOOT EXAM  Never done  . COVID-19 Vaccine (1) 03/01/2020 (Originally 07/31/1953)  . PNA vac Low Risk Adult (1 of 2 - PCV13) 05/13/2020 (Originally 07/31/2005)  . TETANUS/TDAP  02/13/2021 (Originally 08/01/1960)  . INFLUENZA VACCINE  03/30/2020  . HEMOGLOBIN A1C  03/31/2020  . OPHTHALMOLOGY EXAM  12/28/2020   -follow up in 2 months for lab only (aug 17 or after) -see you back in 6 months for appointment for fasting labs -start back your zetia cholesterol medication and let me know if it causes itching.   -drink water and stay away from NSAIDs due to kidney disease (motrin, naproxen, advil)   Preventive Care 80 Years and Older, Male Preventive care refers to lifestyle choices and visits with your health care provider that can promote health and wellness. This includes:  A yearly physical exam. This is also called an annual well check.  Regular dental and eye exams.  Immunizations.  Screening for certain conditions.  Healthy lifestyle choices, such as diet and exercise. What can I expect for my preventive care visit? Physical exam Your health care provider will check:  Height and weight. These may be used to calculate body mass index (BMI), which is a measurement that tells if you are at a healthy weight.  Heart rate and blood pressure.  Your skin for abnormal spots. Counseling Your health care provider may ask you questions about:  Alcohol, tobacco, and drug use.  Emotional well-being.  Home and relationship well-being.  Sexual activity.  Eating habits.  History of falls.  Memory and ability to understand (cognition).  Work and work Statistician. What immunizations do I need?  Influenza (flu) vaccine  This is recommended every year. Tetanus, diphtheria, and pertussis (Tdap) vaccine  You may need a Td booster every 10 years. Varicella (chickenpox) vaccine  You may need this vaccine  if you have not already been vaccinated. Zoster (shingles) vaccine  You may need this after age 47. Pneumococcal conjugate (PCV13) vaccine  One dose is recommended after age 36. Pneumococcal polysaccharide (PPSV23) vaccine  One dose is recommended after age 63. Measles, mumps, and rubella (MMR) vaccine  You may need at least one dose of MMR if you were born in 1957 or later. You may also need a second dose. Meningococcal conjugate (MenACWY) vaccine  You may need this if you have certain conditions. Hepatitis A vaccine  You may need this if you have certain conditions or if you travel or work in places where you may be exposed to hepatitis A. Hepatitis B vaccine  You may need this if you have certain conditions or if you travel or work in places where you may be exposed to hepatitis B. Haemophilus influenzae type b (Hib) vaccine  You may need this if you have certain conditions. You may receive vaccines as individual doses or as more than one vaccine together in one shot (combination vaccines). Talk with your health care provider about the risks and benefits of combination vaccines. What tests do I need? Blood tests  Lipid and cholesterol levels. These may be checked every 5 years, or more frequently depending on your overall health.  Hepatitis C test.  Hepatitis B test. Screening  Lung cancer screening. You may have this screening every year starting at age 32 if you have a 30-pack-year history of smoking and currently smoke or have quit within the past 15 years.  Colorectal cancer screening.  All adults should have this screening starting at age 47 and continuing until age 10. Your health care provider may recommend screening at age 35 if you are at increased risk. You will have tests every 1-10 years, depending on your results and the type of screening test.  Prostate cancer screening. Recommendations will vary depending on your family history and other risks.  Diabetes  screening. This is done by checking your blood sugar (glucose) after you have not eaten for a while (fasting). You may have this done every 1-3 years.  Abdominal aortic aneurysm (AAA) screening. You may need this if you are a current or former smoker.  Sexually transmitted disease (STD) testing. Follow these instructions at home: Eating and drinking  Eat a diet that includes fresh fruits and vegetables, whole grains, lean protein, and low-fat dairy products. Limit your intake of foods with high amounts of sugar, saturated fats, and salt.  Take vitamin and mineral supplements as recommended by your health care provider.  Do not drink alcohol if your health care provider tells you not to drink.  If you drink alcohol: ? Limit how much you have to 0-2 drinks a day. ? Be aware of how much alcohol is in your drink. In the [place] one drink equals one 12 oz bottle of beer (355 mL), one 5 oz glass of wine (148 mL), or one 1 oz glass of hard liquor (44 mL). Lifestyle  Take daily care of your teeth and gums.  Stay active. Exercise for at least 30 minutes on 5 or more days each week.  Do not use any products that contain nicotine or tobacco, such as cigarettes, e-cigarettes, and chewing tobacco. If you need help quitting, ask your health care provider.  If you are sexually active, practice safe sex. Use a condom or other form of protection to prevent STIs (sexually transmitted infections).  Talk with your health care provider about taking a low-dose aspirin or statin. What's next?  Visit your health care provider once a year for a well check visit.  Ask your health care provider how often you should have your eyes and teeth checked.  Stay up to date on all vaccines. This information is not intended to replace advice given to you by your health care provider. Make sure you discuss any questions you have with your health care provider. Document Revised: 08/10/2018 Document Reviewed:  08/10/2018 Elsevier Patient Education  2020 [employer].

## 2020-02-14 NOTE — Progress Notes (Signed)
Phone: [phone]  Subjective:  Patient presents today for their Annual Medicare Exam and follow up of chronic conditions.   Hypertension: Here for follow up of hypertension.  Currently on procardia 19m daily, hctz 28mdaily and lisinopril 2.65m69m Home readings range from [id number]/[id number]. Takes medication as prescribed and denies any side effects. Exercise includes active and busy at home, walking. Weight has been stable. Denies any chest pain, headaches, shortness of breath, vision changes, swelling in lower extremities.   Hyperlipidemia Can not tolerate statins. I started him on zetia to get his LDL closer to goal, but he stopped this when he was itching and never started it back.   Type 2 diabetes Just saw him for this. Currently on trulicity .[id number] and last a1c was 6.4.  -fasting sugars are 110-120   Prostate cancer Followed by urology. Has bone scan soon and possibly new lesions seen on recent CT that I do not have access too. Unsure of what his PSA was.   Hx of CVA -plavix, blood pressure and diabetes control.  -can not tolerate statins, trying to him back on zetia.   Elevated renal function -has back and forth GFR and creatinine. Renal CT of kidneys done by urology and he does have kidney stones. I do not have this to review.    Preventive Screening-Counseling & Management  Advanced directives: yes  Smoking Status: previous Smoker as a teenager.  Second Hand Smoking status: No smokers in home  Risk Factors Regular exercise: no, but active at home.  Diet: well balanced, diabetic.   Fall Risk: None  Fall Risk  12/31/2019 01/24/2019  Falls in the past year? 0 0  Number falls in past yr: - 0  Injury with Fall? - 0   Opioid use history:  no long term opioids use  Cardiac risk factors:  advanced age (older than 55 f67 men, 65 f56 women) yes Hyperlipidemia yes + diabetes. Recently well controlled.  Family History:    Depression Screen None.  PHQ2 0  Depression screen PHQ [facility] 02/14/2020 08/06/2019 01/24/2019  Decreased Interest 0 0 0  Down, Depressed, Hopeless 0 0 0  PHQ - 2 Score 0 0 0    Activities of Daily Living Independent ADLs and IADLs   Hearing Difficulties: -patient declines  Cognitive Testing No reported trouble.    Normal 3 word recall  Mini-cog: 5/5  List the Names of Other Physician/Practitioners you currently use: -Dr. CantJeanne Ivanology  -[facility] [place] There is no immunization history on file for this patient. Required Immunizations needed today: needs pneumonia shot/tdap but declines all immunizations.   Screening tests- up to date Health Maintenance Due  Topic Date Due  . Hepatitis C Screening  Never done  . FOOT EXAM  Never done    Review of Systems  Constitutional: Negative for chills, fever and malaise/fatigue.  HENT: Negative for hearing loss and sore throat.   Eyes: Negative for blurred vision and double vision.  Respiratory: Negative for cough, shortness of breath and wheezing.   Cardiovascular: Negative for chest pain, palpitations and leg swelling.  Gastrointestinal: Negative for abdominal pain, blood in stool, nausea and vomiting.  Genitourinary: Negative for dysuria and hematuria.  Musculoskeletal: Negative for falls.  Skin: Negative for rash.  Neurological: Negative for dizziness and weakness.  Psychiatric/Behavioral: Negative for memory loss and suicidal ideas. The patient is not nervous/anxious and does not have insomnia.      The following were reviewed and entered/updated in  epic: Past Medical History:  Diagnosis Date  . Arthritis   . Blood in stool   . Childhood asthma   . Heart disease   . Hypertension, essential   . Prostate cancer ([place])   . Stroke [facility])    Patient Active Problem List   Diagnosis Date Noted  . Acute ischemic stroke ([place]) 07/31/2019  . HLD (hyperlipidemia) 07/31/2019  . Malignant neoplasm of prostate metastatic to bone ([place])  07/31/2019  . Hypertension, essential 01/24/2019  . TIA (transient ischemic attack) 01/24/2019  . CAD (coronary artery disease) 01/24/2019  . Biochemically recurrent malignant neoplasm of prostate 01/24/2019  . Type 2 diabetes with nephropathy ([place]) 01/24/2019  . Macular degeneration 01/24/2019   Past Surgical History:  Procedure Laterality Date  . CORONARY ANGIOPLASTY WITH STENT PLACEMENT    . GANGLION CYST EXCISION Right     Family History  Problem Relation Age of Onset  . Hyperlipidemia Mother   . Hypertension Mother   . Stroke Father   . Hyperlipidemia Sister   . Depression Brother   . Early death Brother   . Heart disease Brother   . Mental illness Brother     Medications- reviewed and updated Current Outpatient Medications  Medication Sig Dispense Refill  . betamethasone dipropionate 0.05 % cream Apply topically 2 (two) times daily. 50 g 1  . blood glucose meter kit and supplies Use once a day in the AM for fasting sugars. 1 each 0  . Cholecalciferol (VITAMIN D3) 50 MCG (2000 UT) TABS Take by mouth.    . clopidogrel (PLAVIX) 75 MG tablet TAKE 1 TABLET EVERY DAY 90 tablet 1  . Dulaglutide (TRULICITY) 6.01 UX/3.2TF SOPN Inject 0.75 mg into the skin once a week. 4 pen 0  . Ez Smart Blood Glucose Lancets MISC by Does not apply route. 100 each 3  . ezetimibe (ZETIA) 10 MG tablet Take 1 tablet (10 mg total) by mouth daily. 90 tablet 3  . glucose blood test strip Use as instructed 100 each 3  . hydrochlorothiazide (HYDRODIURIL) 25 MG tablet Take 25 mg by mouth daily.    . hydrOXYzine (ATARAX/VISTARIL) 10 MG tablet Take 1 tablet (10 mg total) by mouth 3 (three) times daily as needed for itching. 30 tablet 0  . lisinopril (ZESTRIL) 2.5 MG tablet Take 1 tablet (2.5 mg total) by mouth daily. 90 tablet 3  . Multiple Vitamin (MULTI-DAY VITAMINS PO) Take by mouth.    . Multiple Vitamins-Minerals (PRESERVISION AREDS PO) Take by mouth. Soft gels    . NIFEdipine (PROCARDIA  XL/NIFEDICAL-XL) 90 MG 24 hr tablet TAKE 1 TABLET EVERY DAY 90 tablet 1  . polyethylene glycol (MIRALAX / GLYCOLAX) 17 g packet Take 17 g by mouth daily.    . potassium chloride SA (KLOR-CON) 20 MEQ tablet TAKE 1 TABLET TWICE DAILY 180 tablet 0  . Saw Palmetto 450 MG CAPS Take by mouth.    . vitamin B-12 (CYANOCOBALAMIN) 1000 MCG tablet Take 1,000 mcg by mouth daily.     No current facility-administered medications for this visit.    Allergies-reviewed and updated Allergies  Allergen Reactions  . Glipizide Itching  . Statins Other (See Comments)    Causes narcolepsy    Social History   Socioeconomic History  . Marital status: Married    Spouse name: Not on file  . Number of children: Not on file  . Years of education: Not on file  . Highest education level: Not on file  Occupational History  .  Not on file  Tobacco Use  . Smoking status: Former Research scientist (life sciences)  . Smokeless tobacco: Never Used  Vaping Use  . Vaping Use: Never used  Substance and Sexual Activity  . Alcohol use: Not Currently  . Drug use: Never  . Sexual activity: Yes    Partners: Female  Other Topics Concern  . Not on file  Social History Narrative  . Not on file   Social Determinants of Health   Financial Resource Strain:   . Difficulty of Paying Living Expenses:   Food Insecurity:   . Worried About Charity fundraiser in the Last Year:   . Arboriculturist in the Last Year:   Transportation Needs:   . Film/video editor (Medical):   Marland Kitchen Lack of Transportation (Non-Medical):   Physical Activity:   . Days of Exercise per Week:   . Minutes of Exercise per Session:   Stress:   . Feeling of Stress :   Social Connections:   . Frequency of Communication with Friends and Family:   . Frequency of Social Gatherings with Friends and Family:   . Attends Religious Services:   . Active Member of Clubs or Organizations:   . Attends Archivist Meetings:   Marland Kitchen Marital Status:     Objective: BP 134/80    Pulse 71   Temp 97.9 F (36.6 C) (Temporal)   Ht _0  (1.778 m)   Wt 168 lb 12.8 oz (76.6 kg)   SpO2 95%   BMI 24.22 kg/m  Gen: NAD, resting comfortably HEENT: Mucous membranes are moist. Oropharynx normal Neck: no thyromegaly. No carotid bruits bilaterally  CV: RRR no murmurs rubs or gallops Lungs: CTAB no crackles, wheeze, rhonchi Abdomen: soft/nontender/nondistended/normal bowel sounds. No rebound or guarding.  Ext: no edema Skin: warm, dry Neuro: grossly normal, moves all extremities, PERRLA  Assessment/Plan:   Medicare exam completed- discussed recommended screenings anddocumented any personalized health advice and referrals for preventive counseling. See AVS as well which was given to patient.   Status of chronic or acute concerns  1. Medicare annual wellness visit, subsequent Hm discussed. Vaccines declines. Risks discussed.  Hep C screen future ordered.  Advanced directive in place.   2. Hypertension, essential Blood pressure is to goal. Continue current anti-hypertensive medications per hpi. Refills not needed and routine lab work done in 12/2019.  Recommended routine exercise and healthy diet including DASH diet and mediterranean diet. Encouraged weight loss. F/u in 6 months.    3. Mixed hyperlipidemia I want him to start back on zetia to see if we can get his LDL closer to goal since intolerant to statin therapy. Discussed with CVA and diabetes I want to control this as much as possible. If it causes him to itch he can stop it. He will start back on this and we can check lipids in 6 months time.   4. Type 2 diabetes with nephropathy ([place]) a1c to goal last month. Will future order labs to f/u 3 months from last a1c.  - Hemoglobin A1c; Future - Basic metabolic panel; Future  5. Encounter for hepatitis C screening test for low risk patient  - Hepatitis C antibody; Future  6. ?CKD -GFR and creatinine have fluctuated. Not quite been 3 months to say CKD, but I  would not be surprised. Recommended he really try to increase water intake. He does avoid all NSAIDs. Diabetes is now controlled as is his blood pressure, but has long hx of uncontrolled diabetes  and htn that could have contributed to this. Will f/u on labs in 2 months.   Future Appointments  Date Time Provider [place]  02/18/2020  9:00 AM [facility] 1 [facility] [place]  02/18/2020 12:00 PM [facility] 1 [facility] [place]  02/19/2020  1:00 PM Tyler Pita, MD [facility] None  04/16/2020  8:15 AM [facility] LAB [facility]  08/15/2020 10:40 AM [name], MD [facility]   Return in about 6 months (around 08/15/2020) for 2 months lab only and then 6 month (diabetes/cholestero/bp with fasting labs).   This visit occurred during the SARS-CoV-2 public health emergency.  Safety protocols were in place, including screening questions prior to the visit, additional usage of staff PPE, and extensive cleaning of exam room while observing appropriate contact time as indicated for disinfecting solutions.   Return precautions advised. [name], MD

## 2020-02-14 NOTE — Progress Notes (Deleted)
Patient: Wayne Perkins MRN: [id number] DOB: 05/23/1941 PCP: [name], MD     Subjective:  Chief Complaint  Patient presents with  . Annual Exam  . Diabetes  . Hyperlipidemia  . Hypertension    HPI: The patient is a 79 y.o. male who presents today for Annual visit. He says that he was not able to administer his Trulicity dosage this week, the needle did not eject into his skin. He has the pen with him today.   Review of Systems  Respiratory: Negative for cough, shortness of breath and wheezing.   Cardiovascular: Negative for chest pain and palpitations.  Neurological: Negative for dizziness, light-headedness and headaches.    Allergies Patient is allergic to glipizide and statins.  Past Medical History Patient  has a past medical history of Arthritis, Blood in stool, Childhood asthma, Heart disease, Hypertension, essential, Prostate cancer ([place]), and Stroke ([place]).  Surgical History Patient  has a past surgical history that includes Ganglion cyst excision (Right) and Coronary angioplasty with stent.  Family History [name]'s family history includes Depression in his brother; Early death in his brother; Heart disease in his brother; Hyperlipidemia in his mother and sister; Hypertension in his mother; Mental illness in his brother; Stroke in his father.  Social History Patient  reports that he has quit smoking. He has never used smokeless tobacco. He reports previous alcohol use. He reports that he does not use drugs.    Objective: Vitals:   02/14/20 0942  BP: 134/80  Pulse: 71  Temp: 97.9 F (36.6 C)  TempSrc: Temporal  SpO2: 95%  Weight: 168 lb 12.8 oz (76.6 kg)  Height: 5\' 10"  (1.778 m)    Body mass index is 24.22 kg/m.  Physical Exam     Assessment/plan:      No follow-ups on file.     @AWME @ 02/14/2020

## 2020-02-15 ENCOUNTER — Encounter: Payer: Self-pay | Admitting: Medical Oncology

## 2020-02-15 NOTE — Progress Notes (Signed)
Spoke with patient to acquire information regarding prostate biopsy in [place]. He informed me he was diagnosed in 2012 by Dr. Cresenciano Lick at  [facility]. I call to try and obtain any notes and pathology report.

## 2020-02-15 NOTE — Progress Notes (Signed)
[facility] and faxed request for medical records

## 2020-02-15 NOTE — Progress Notes (Signed)
[facility] pathology and faxed request for prostate biopsy slides from 2015.

## 2020-02-15 NOTE — Progress Notes (Signed)
I called pt to introduce myself as the Prostate Nurse Navigator and the Coordinator of the Prostate [place].  1. I confirmed with the patient he is aware of his referral to the clinic 6/22, arriving at 12:30 pm.  2. I discussed the format of the clinic and the physicians he will be seeing that day.  3. I discussed where the clinic is located, COVID restrictions  and how to contact me.  4. I confirmed his address and informed him I would be mailing a packet of information and forms to be completed. I asked him to bring them with him the day of his appointment.   He voiced understanding of the above. I asked him to call me if he has any questions or concerns regarding his appointments or the forms he needs to complete.

## 2020-02-15 NOTE — Progress Notes (Signed)
Spoke with Pam to follow up on request for prostate biopsy slides. I was informed they could not Fed Ex the slides unless we sent a pre-printed form. I informed her we do not print labels but use a FedEx form where the number is listed. She states they do not have forms. She will not be able to get me slides in time for clinic 6/22.

## 2020-02-18 ENCOUNTER — Ambulatory Visit (HOSPITAL_COMMUNITY)
Admission: RE | Admit: 2020-02-18 | Discharge: 2020-02-18 | Disposition: A | Discharge status: Home / Self Care | Payer: Medicare HMO | Source: Ambulatory Visit | Attending: Urology | Admitting: Urology

## 2020-02-18 ENCOUNTER — Other Ambulatory Visit: Payer: Self-pay

## 2020-02-18 ENCOUNTER — Encounter (HOSPITAL_COMMUNITY)
Admission: RE | Admit: 2020-02-18 | Discharge: 2020-02-18 | Disposition: A | Discharge status: Home / Self Care | Payer: Medicare HMO | Source: Ambulatory Visit | Attending: Urology | Admitting: Urology

## 2020-02-18 DIAGNOSIS — C61 Malignant neoplasm of prostate: Secondary | ICD-10-CM | POA: Diagnosis not present

## 2020-02-18 DIAGNOSIS — Z8546 Personal history of malignant neoplasm of prostate: Secondary | ICD-10-CM | POA: Diagnosis not present

## 2020-02-18 MED ORDER — TECHNETIUM TC 99M MEDRONATE IV KIT
21.9000 | PACK | Freq: Once | INTRAVENOUS | Status: AC | PRN
  Administered 2020-02-18: 21.9 via INTRAVENOUS

## 2020-02-18 NOTE — Progress Notes (Signed)
GU Location of Tumor / Histology: biochemically recurrent prostate cancer  If Prostate Cancer, Gleason Score is (3 + 3) and PSA is (6) at diagnosis.    [place] established care with Dr. Alinda Money in June 2020. Formerly from the [place] area he moved to [place] to be closer to family. Apparently he was dx with prostate ca in 2015 with a psa 6. He opted for active surveillance until his psa reached 9 and he received xrt, again in 2015. He has been on and off ADT over the years due to blood sugar issues, weight gain, hot flashes, etc.  Biopsies of prostate (if applicable) revealed:   Past/Anticipated interventions by urology, if any: biopsy, active surveillance, ADT, referral to [facility]  Past/Anticipated interventions by medical oncology, if any: no  Weight changes, if any: denies  Bowel/Bladder complaints, if any: IPSS 10. SHIM 7. Reports dribbling urine. Reports hematuria. Denies dysuria. Denies any bowel complaints.  Nausea/Vomiting, if any: no  Pain issues, if any:  denies  SAFETY ISSUES:  Prior radiation? Yes in [place] in 2015 under the care of radiation oncology, Dr. Irish Elders and urologist, Dr. Judeen Hammans  Pacemaker/ICD? Denies. Patient does have two cardiac stents.  Possible current pregnancy? no, male patient  Is the patient on methotrexate? no  Current Complaints / other details:  79 year old male. Married with two sons and one daughter.

## 2020-02-19 ENCOUNTER — Ambulatory Visit
Admission: RE | Admit: 2020-02-19 | Discharge: 2020-02-19 | Disposition: A | Discharge status: Home / Self Care | Payer: Medicare HMO | Source: Ambulatory Visit | Attending: Radiation Oncology | Admitting: Radiation Oncology

## 2020-02-19 ENCOUNTER — Other Ambulatory Visit: Payer: Self-pay

## 2020-02-19 ENCOUNTER — Encounter: Payer: Self-pay | Admitting: Medical Oncology

## 2020-02-19 ENCOUNTER — Telehealth: Payer: Self-pay

## 2020-02-19 ENCOUNTER — Inpatient Hospital Stay: Payer: Medicare HMO | Attending: Oncology | Admitting: Oncology

## 2020-02-19 ENCOUNTER — Encounter: Payer: Self-pay | Admitting: General Practice

## 2020-02-19 ENCOUNTER — Encounter: Payer: Self-pay | Admitting: Radiation Oncology

## 2020-02-19 ENCOUNTER — Telehealth: Payer: Self-pay | Admitting: Pharmacist

## 2020-02-19 VITALS — BP 141/79 | HR 81 | Temp 97.4°F | Resp 20 | Ht 70.5 in | Wt 171.6 lb

## 2020-02-19 DIAGNOSIS — Z87891 Personal history of nicotine dependence: Secondary | ICD-10-CM | POA: Diagnosis not present

## 2020-02-19 DIAGNOSIS — Z923 Personal history of irradiation: Secondary | ICD-10-CM | POA: Diagnosis not present

## 2020-02-19 DIAGNOSIS — Z7984 Long term (current) use of oral hypoglycemic drugs: Secondary | ICD-10-CM | POA: Diagnosis not present

## 2020-02-19 DIAGNOSIS — C7951 Secondary malignant neoplasm of bone: Secondary | ICD-10-CM | POA: Diagnosis not present

## 2020-02-19 DIAGNOSIS — E119 Type 2 diabetes mellitus without complications: Secondary | ICD-10-CM | POA: Insufficient documentation

## 2020-02-19 DIAGNOSIS — C61 Malignant neoplasm of prostate: Secondary | ICD-10-CM | POA: Insufficient documentation

## 2020-02-19 DIAGNOSIS — R3121 Asymptomatic microscopic hematuria: Secondary | ICD-10-CM | POA: Diagnosis not present

## 2020-02-19 DIAGNOSIS — Z79899 Other long term (current) drug therapy: Secondary | ICD-10-CM | POA: Insufficient documentation

## 2020-02-19 DIAGNOSIS — Z191 Hormone sensitive malignancy status: Secondary | ICD-10-CM | POA: Diagnosis not present

## 2020-02-19 DIAGNOSIS — F329 Major depressive disorder, single episode, unspecified: Secondary | ICD-10-CM | POA: Diagnosis not present

## 2020-02-19 DIAGNOSIS — E785 Hyperlipidemia, unspecified: Secondary | ICD-10-CM | POA: Insufficient documentation

## 2020-02-19 DIAGNOSIS — R9721 Rising PSA following treatment for malignant neoplasm of prostate: Secondary | ICD-10-CM | POA: Diagnosis not present

## 2020-02-19 DIAGNOSIS — Z8673 Personal history of transient ischemic attack (TIA), and cerebral infarction without residual deficits: Secondary | ICD-10-CM | POA: Diagnosis not present

## 2020-02-19 DIAGNOSIS — Z192 Hormone resistant malignancy status: Secondary | ICD-10-CM | POA: Diagnosis not present

## 2020-02-19 DIAGNOSIS — M129 Arthropathy, unspecified: Secondary | ICD-10-CM | POA: Insufficient documentation

## 2020-02-19 HISTORY — DX: Type 2 diabetes mellitus without complications: E11.9

## 2020-02-19 MED ORDER — ENZALUTAMIDE 40 MG PO TABS: 160.0000 mg | ORAL_TABLET | Freq: Every day | ORAL | 0 refills | Status: DC

## 2020-02-19 NOTE — Consult Note (Signed)
[facility]     02/19/2020   --------------------------------------------------------------------------------   Wayne Perkins  MRN: [id number]  DOB: 02-14-1941, 79 year old Male  SSN:    PRIMARY CARE:  [name], MD  REFERRING:  Wayne Perkins, Wayne Perkins  PROVIDER:  Raynelle Perkins, M.D.  LOCATION:  [facility] [phone]     --------------------------------------------------------------------------------   CC/HPI: CC: Prostate Cancer   PCP: Wayne Perkins  Location of consult: [facility]   Wayne Perkins is a very pleasant 79 year old gentleman who established care with me in June 2020 for follow-up of his biochemically recurrent prostate cancer. His past medical history is significant for coronary artery disease. He does have a history of a myocardial infarction in 2004 and has undergone cardiac stent placement 2004 and again in 2010. He also had a TIA in 2019. He is currently on Plavix for that reason. He is originally from the [place] area and moved to [place] to be closer to his children and their families. He apparently was diagnosed with prostate cancer in 2015 when his PSA was initially around 6. It appears that he might have had Gleason 6 prostate cancer and initially underwent surveillance. However, when his PSA increased to 9, he was treated with external beam radiation therapy later in 2015. I do not have the records from his initial treatment with his initial urologist. His original urologist apparently retired and his care was picked up by 1 of his partners. He apparently had a rising PSA and had a metastatic evaluation with a questionable left ischial lesion although I do not have these reports. He was placed on androgen deprivation therapy for 9 months. During this time, he did have expected hot flashes, gained approximately 8-9 lb, and did develop diabetes requiring treatment with  metformin. Due to the fact that he did not have a great relationship with his new urologist, he subsequently got a referral from his primary care physician and was seen by another urologist, Wayne Perkins.   When he establish care with Wayne Perkins, his PSA was close to undetectable. Based on his side effects and his excellent response to treatment, a determination was made to proceed with intermittent therapy. His PSA level in November 2018 was 0.40. At that time, his testosterone was 181 indicating some recovery of testosterone production. His PSA was 0.46 in February 2019, 1.52 in September 2019, 4.69 in December 2019, and 10.42 in March 2020. He did have a telehealth visit with Wayne Perkins and he was ordered to get a bone scan and CT scan. He was in [place] at that time and had both of these tests performed on 01/15/2019. I was able to independently review those studies which did not indicate evidence of measurable metastatic disease. Off androgen deprivation therapy, he has been able to stop metformin. He denies hot flashes and feels quite good without any pain symptoms or voiding complaints. He remained off therapy and rechecked his PSA on 02/27/19 which had increased to 24.7. For this reason, he did restart ADT and his PSA decreased to 4.07. His PSA was 4.27 in January 2021 and due to having significant side effects from therapy, he elected to again stop treatment. His PSA was 5.68 in April 2021. At his visit in April, he was noted to have microscopic hematuria incidentally and so underwent a hematuria protocol CT scan although he delayed this until 01/29/20. This indicated bilateral non-obstructing  renal calculi as the most likely source of his hematuria and a Bosniak IIF left renal lesion. He was also noted to have numerous sclerotic bone lesions throughout his axial and appendicular skeleton concerning for widespread metastatic disease. A bone scan was performed on 02/18/20 and this  demonstrated some increased uptake at the thoracic spine but otherwise was relatively unchanged from his prior scan last spring.    He denies any gross hematuria. He has not yet undergone cystoscopy to complete his hematuria evaluation.     ALLERGIES: Statins    MEDICATIONS: Hydrochlorothiazide  Lisinopril 2.5 mg tablet  Plavix  Hydroxyzine Hcl  Miralax  Multiple Vitamin  Nifedipine  Potassium Chloride  Preservision Areds 2  Saw Palmetto  Trulicity  Vitamin B 12  Vitamin D3     GU PSH: Locm 300-399Mg /Ml Iodine,1Ml - 01/29/2020     NON-GU PSH: Cardiac Stent Placement     GU PMH: Microscopic hematuria - 12/26/2019 Prostate Cancer - 02/26/2019      PMH Notes:   1) Prostate cancer: Wayne Perkins is a very pleasant gentleman who established care with me in June 2020 for follow-up of his biochemically recurrent prostate cancer. He is originally from the [place] area and moved to [place] to be closer to his children and their families. He apparently was diagnosed with prostate cancer in 2015 when his PSA was initially around 6. It appears that he might have had Gleason 6 prostate cancer in initially underwent surveillance. However, when his PSA increased to 9, he was treated with external beam radiation therapy, again in 2015. I do not have the records from his initial treatment with his initial urologist. His original urologist apparently retired and his care was picked up by 1 of his partners. He apparently had a rising PSA and had a metastatic evaluation with a questionable left ischial lesion although I do not have these reports. He was placed on androgen deprivation therapy for 9 months. During this time, he did have expected hot flashes, gained approximately 8-9 lb, and did develop diabetes requiring treatment with metformin. Due to the fact that he did not have a great relationship with his new urologist, he subsequently got a referral from his primary care physician and was  seen by yet another urologist, Wayne Perkins.   When he establish care with Wayne Perkins, his PSA was close to undetectable. Based on his side effects and his excellent response to treatment, a determination was made to proceed with intermittent therapy. His PSA level in November 2018 was 0.40. At that time, his testosterone was 181 indicating some recovery of testosterone production. His PSA was 0.46 in February 2019, 1.52 in September 2019, 4.69 in December 2019, and 10.42 in March 2020. He did have a telehealth visit with Wayne Perkins and he was ordered to get a bone scan and CT scan. He was in [place] at that time and had both of these tests performed on 01/15/2019. I was able to independently review those studies which do not indicate evidence of measurable metastatic disease. Off androgen deprivation therapy, he has been able to stop metformin. He denies hot flashes and feels quite good without any pain symptoms or voiding complaints.   Jun 2020: Established care with me  Jul 2020: Restarted ADT (PSA 24.7)  Jan 2021: Stopped ADT (PSA 4.27)   2) Hematuria: He was noted to have recurrent microscopic hematuria in April 2021.   Jun 2021: CT imaging - , Cysto -   **  His past medical history is significant for coronary artery disease. He does have a history of a myocardial infarction in 2004 and has undergone cardiac stent placement 2004 and again in 2010. He also had a TIA in 2019. He is currently on Plavix for that reason.     NON-GU PMH: Anxiety Arthritis Diabetes Type 2 Heart disease, unspecified Hypertension Stroke/TIA    FAMILY HISTORY: Alzheimer's Disease - Mother stroke - Father    Notes: 2 sons, 1 daughter    SOCIAL HISTORY: Marital Status: Married Preferred Language: English; Ethnicity: Not Hispanic Or Latino; Race: White Current Smoking Status: Patient does not smoke anymore. Has not smoked since 01/28/2013. Smoked for 3 months. Smoked less than 1/2 pack per  day.   Tobacco Use Assessment Completed: Used Tobacco in last 30 days? Does not drink anymore.  Does not drink caffeine.    REVIEW OF SYSTEMS:    GU Review Male:   Patient denies leakage of urine, stream starts and stops, hard to postpone urination, burning/ pain with urination, trouble starting your streams, get up at night to urinate, frequent urination, and have to strain to urinate .  Gastrointestinal (Upper):   Patient denies nausea and vomiting.  Gastrointestinal (Lower):   Patient denies diarrhea and constipation.  Constitutional:   Patient denies fever, night sweats, weight loss, and fatigue.  Skin:   Patient denies skin rash/ lesion and itching.  Eyes:   Patient denies blurred vision and double vision.  Ears/ Nose/ Throat:   Patient denies sore throat and sinus problems.  Hematologic/Lymphatic:   Patient denies swollen glands and easy bruising.  Cardiovascular:   Patient denies leg swelling and chest pains.  Respiratory:   Patient denies cough and shortness of breath.  Endocrine:   Patient denies excessive thirst.  Musculoskeletal:   Patient denies back pain and joint pain.  Neurological:   Patient denies headaches and dizziness.  Psychologic:   Patient denies depression and anxiety.   VITAL SIGNS: None   MULTI-SYSTEM PHYSICAL EXAMINATION:    Constitutional: Well-nourished. No physical deformities. Normally developed. Good grooming.     Complexity of Data:  Lab Test Review:   PSA  Records Review:   Previous Patient Records  X-Ray Review: C.T. Abdomen/Pelvis: Reviewed Films.  Bone Scan: Reviewed Films.     12/19/19 09/05/19 06/05/19 02/27/19  PSA  Total PSA 5.68 ng/mL 4.27 ng/mL 4.07 ng/mL 24.70 ng/mL    12/19/19 09/05/19 06/05/19  Hormones  Testosterone, Total <10 ng/dL <10 ng/dL <10 ng/dL    PROCEDURES: None   ASSESSMENT:      ICD-10 Details  1 GU:   Prostate Cancer - C61   2   Microscopic hematuria - R31.21    PLAN:           Document Letter(s):   Created for Patient: Clinical Summary         Notes:   1. Metastatic prostate cancer: He has seen in discussed his situation in detail with Dr. Alen Blew. After reviewing his imaging findings in the multidisciplinary conference this afternoon, recommendation was to proceed with combination therapy with androgen deprivation and either androgen receptor blockade or androgen synthesis inhibitor therapy with an option for consideration of chemotherapy instead. He is accepting of his situation and does agree to proceed with systemic therapy. After speaking with Dr. Alen Blew, they are tentatively planning on reinstituting androgen deprivation and additionally enzalutamide. This is going to be scheduled by Dr. Alen Blew and he will continue ongoing therapy under his care for  treatment of his prostate cancer. All questions were answered to his and his wife's satisfaction.   2. Microscopic hematuria: He does still need to follow-up for his hematuria. He will be scheduled for a cystoscopy near future we will see about moving this appointment up.   3. Bosniak IIF left renal lesion: This is likely to be of very minimal clinical significance for him. However, we will discuss follow-up imaging if this is not been performed over the next 6-12 months.   Cc: Wayne Perkins  Dr. Zola Button  Dr. Tyler Pita         Next Appointment:      Next Appointment: 03/26/2020 08:30 AM    Appointment Type: Laboratory Appointment    Location: [facility] [phone]    Provider: Lab LAB    Reason for Visit: 3 MO PSA, TOTAL T      E & M CODES: We spent 38 minutes dedicated to evaluation and management time, including face to face interaction, discussions on coordination of care, documentation, result review, and discussion with others as applicable.

## 2020-02-19 NOTE — Telephone Encounter (Signed)
Oral Oncology Patient Advocate Encounter  Received notification from [place] that prior authorization for Wayne Perkins is required.  PA submitted on CoverMyMeds Key [id number] Status is pending  Oral Oncology Clinic will continue to follow.  [place] [name] Phone [phone] Fax [phone] 02/19/2020 2:16 PM

## 2020-02-19 NOTE — Progress Notes (Signed)
                               Care Plan Summary  Name: Wayne Perkins DOB: 06/01/1941   Your Medical Team:   Urologist -  Dr. Raynelle Bring, [facility]  Radiation Oncologist - Dr. Tyler Pita, [facility]   Medical Oncologist - Dr. Zola Button, [place]  Recommendations: 1) Lupron injections with oral [name]     * These recommendations are based on information available as of today's consult.      Recommendations may change depending on the results of further tests or exams.  Next Steps: 1) Our oral chemo pharmacist will call you to discuss Lupron and Xtandi patient assistance.    When appointments need to be scheduled, you will be contacted by [facility] and/or [facility].  Questions?  Please do not hesitate to call Cira Rue, RN, BSN, OCN at [phone] any questions or concerns.  Shirlean Mylar is your Oncology Nurse Navigator and is available to assist you while you're receiving your medical care at [facility].

## 2020-02-19 NOTE — Telephone Encounter (Signed)
Oral Oncology Patient Advocate Encounter  Prior Authorization for Wayne Perkins has been approved.    PA# [id number] Effective dates: 02/19/20 through 08/17/20  Patients co-pay is $2082.24  Oral Oncology Clinic will continue to follow.   [place] [name] Phone [phone] Fax [phone] 02/19/2020 2:18 PM

## 2020-02-19 NOTE — Telephone Encounter (Signed)
Oral Oncology Pharmacist Encounter  Received new prescription for Xtandi (enzalutamide) for the treatment of metastatic castrate resistant prostate cancer, planned duration until disease progression or unacceptable drug toxicity.  Prescription dose and frequency assessed.   Current medication list in Epic reviewed, two DDIs with enzalutamide identified: - Xtandi may decrease the serum concentration of nifedipine. Monitor patient for decreased effectiveness of the listed medications. No baseline dose adjustment needed.  - Clopidogrel: clopidogrel may increase the exposure to enzalutamide. Monitor for increased enzalutamide AEs. No baseline dose adjustments needed.  Prescription has been e-scribed to the [facility] for benefits analysis and approval.  Oral Oncology Clinic will continue to follow for insurance authorization, copayment issues, initial counseling and start date.  Darl Pikes, PharmD, BCPS, BCOP, CPP Hematology/Oncology Clinical Pharmacist Practitioner [facility] [phone]  02/19/2020 3:55 PM

## 2020-02-19 NOTE — Progress Notes (Signed)
[place] Psychosocial Distress Screening Spiritual Care  Met with Wayne Perkins and his wife Wayne Perkins [facility] to introduce [place] team/resources, reviewing distress screen per protocol.  The patient scored a 1 on the Psychosocial Distress Thermometer which indicates mild distress. Also assessed for distress and other psychosocial needs.   ONCBCN DISTRESS SCREENING 02/19/2020  Screening Type Initial Screening  Distress experienced in past week (1-10) 1  Emotional problem type Nervousness/Anxiety  Physical Problem type Sleep/insomnia;Skin dry/itchy  Referral to support programs Yes   Ehren and Wayne Perkins are approaching their 62rd anniversary in November, which is a joy for them. [name] notes that retirement has been an adjustment, but overall has reduced his stress. Faith is an important part of making meaning and coping for him.   Follow up needed: No. Couple reports no other needs or concerns at this time, but is aware of ongoing [place] team/programming resources. Please also page if needs arise or circumstances change. Thank you.   [place], [place], [facility] Pager [phone] Voicemail [phone]

## 2020-02-19 NOTE — Progress Notes (Signed)
Left message to confirm [facility] appointment today, arriving at 12:30 pm. I reviewed the location, registration and COVID protocols. I reminded him to bring the completed medical forms and asked him to call me with questions or concerns.

## 2020-02-19 NOTE — Progress Notes (Signed)
Reason for the request:    Prostate cancer  HPI: I was asked by Dr. Alinda Money to evaluate Wayne Perkins for the treatment of prostate cancer.  He is a 79 year old man native of [place] relocated to this area in the last year.  He has history of prostate cancer dating back 2015 where he initially presented with a Gleason score of 6 and a PSA of 6.  He was on active surveillance for a period of time and subsequently had a rise in his PSA that necessitated definitive treatment and received external beam radiation as a definitive treatment.  His PSA continues to rise subsequently and was started on androgen deprivation therapy for biochemically recurrent disease.  His PSA decreased down to 0.4 in November 2018.  Due to hot flashes and increased toxicities he was receiving it intermittently and was off androgen deprivation therapy in 2019 with a PSA rise up to 10.9.  He subsequently has relocated to [facility] and staging work-up in May 2020 did not show any evidence of metastatic disease.  He did receive another 6 months of androgen deprivation injection under the care of Dr. Alinda Money and received ADT at that time after his PSA was up to 24.7.  January 2021 is PSA was 4.27 and stopped ADT.  He started developing hematuria and subsequently had a CT scan hematuria protocol in June 2021.  Small sclerotic lesions noted throughout the visualized axilla is [name] concerning for widespread metastatic disease.  His PSA at that time was 5.68 in April 2021.  Bone scan at that time did not show any evidence of metastatic disease except for a new midthoracic focal area of increased activity suggestive of metastatic disease.  Clinically, he reports no complaints at this time.  Hematuria is resolved.  He denies any excessive fatigue tiredness or bone pain.  Denies any pathological fractures or constitutional symptoms.  Despite his age remains active and continues hypersensitivities of daily living.  His stamina has decreased however.  He  does not report any headaches, blurry vision, syncope or seizures. Does not report any fevers, chills or sweats.  Does not report any cough, wheezing or hemoptysis.  Does not report any chest pain, palpitation, orthopnea or leg edema.  Does not report any nausea, vomiting or abdominal pain.  Does not report any constipation or diarrhea.  Does not report any skeletal complaints.    Does not report frequency, urgency or hematuria.  Does not report any skin rashes or lesions. Does not report any heat or cold intolerance.  Does not report any lymphadenopathy or petechiae.  Does not report any anxiety or depression.  Remaining review of systems is negative.    Past Medical History:  Diagnosis Date  . Arthritis   . Blood in stool   . Childhood asthma   . Heart disease   . Hypertension, essential   . Prostate cancer ([place])   . Stroke [facility])   :  Past Surgical History:  Procedure Laterality Date  . CORONARY ANGIOPLASTY WITH STENT PLACEMENT    . GANGLION CYST EXCISION Right   :   Current Outpatient Medications:  .  betamethasone dipropionate 0.05 % cream, Apply topically 2 (two) times daily., Disp: 50 g, Rfl: 1 .  blood glucose meter kit and supplies, Use once a day in the AM for fasting sugars., Disp: 1 each, Rfl: 0 .  Cholecalciferol (VITAMIN D3) 50 MCG (2000 UT) TABS, Take by mouth., Disp: , Rfl:  .  clopidogrel (PLAVIX) 75 MG tablet, TAKE  1 TABLET EVERY DAY, Disp: 90 tablet, Rfl: 1 .  Dulaglutide (TRULICITY) 6.37 CH/8.8FO SOPN, Inject 0.75 mg into the skin once a week., Disp: 4 pen, Rfl: 0 .  Ez Smart Blood Glucose Lancets MISC, by Does not apply route., Disp: 100 each, Rfl: 3 .  ezetimibe (ZETIA) 10 MG tablet, Take 1 tablet (10 mg total) by mouth daily., Disp: 90 tablet, Rfl: 3 .  glucose blood test strip, Use as instructed, Disp: 100 each, Rfl: 3 .  hydrochlorothiazide (HYDRODIURIL) 25 MG tablet, Take 25 mg by mouth daily., Disp: , Rfl:  .  hydrOXYzine (ATARAX/VISTARIL) 10 MG tablet, Take 1  tablet (10 mg total) by mouth 3 (three) times daily as needed for itching., Disp: 30 tablet, Rfl: 0 .  lisinopril (ZESTRIL) 2.5 MG tablet, Take 1 tablet (2.5 mg total) by mouth daily., Disp: 90 tablet, Rfl: 3 .  Multiple Vitamin (MULTI-DAY VITAMINS PO), Take by mouth., Disp: , Rfl:  .  Multiple Vitamins-Minerals (PRESERVISION AREDS PO), Take by mouth. Soft gels, Disp: , Rfl:  .  NIFEdipine (PROCARDIA XL/NIFEDICAL-XL) 90 MG 24 hr tablet, TAKE 1 TABLET EVERY DAY, Disp: 90 tablet, Rfl: 1 .  polyethylene glycol (MIRALAX / GLYCOLAX) 17 g packet, Take 17 g by mouth daily., Disp: , Rfl:  .  potassium chloride SA (KLOR-CON) 20 MEQ tablet, TAKE 1 TABLET TWICE DAILY, Disp: 180 tablet, Rfl: 0 .  Saw Palmetto 450 MG CAPS, Take by mouth., Disp: , Rfl:  .  vitamin B-12 (CYANOCOBALAMIN) 1000 MCG tablet, Take 1,000 mcg by mouth daily., Disp: , Rfl: :  Allergies  Allergen Reactions  . Glipizide Itching  . Statins Other (See Comments)    Causes narcolepsy  :  Family History  Problem Relation Age of Onset  . Hyperlipidemia Mother   . Hypertension Mother   . Stroke Father   . Hyperlipidemia Sister   . Depression Brother   . Early death Brother   . Heart disease Brother   . Mental illness Brother   :  Social History   Socioeconomic History  . Marital status: Married    Spouse name: Not on file  . Number of children: Not on file  . Years of education: Not on file  . Highest education level: Not on file  Occupational History  . Not on file  Tobacco Use  . Smoking status: Former Research scientist (life sciences)  . Smokeless tobacco: Never Used  Vaping Use  . Vaping Use: Never used  Substance and Sexual Activity  . Alcohol use: Not Currently  . Drug use: Never  . Sexual activity: Yes    Partners: Female  Other Topics Concern  . Not on file  Social History Narrative  . Not on file   Social Determinants of Health   Financial Resource Strain:   . Difficulty of Paying Living Expenses:   Food Insecurity:   .  Worried About Charity fundraiser in the Last Year:   . Arboriculturist in the Last Year:   Transportation Needs:   . Film/video editor (Medical):   Marland Kitchen Lack of Transportation (Non-Medical):   Physical Activity:   . Days of Exercise per Week:   . Minutes of Exercise per Session:   Stress:   . Feeling of Stress :   Social Connections:   . Frequency of Communication with Friends and Family:   . Frequency of Social Gatherings with Friends and Family:   . Attends Religious Services:   . Active Member of  Clubs or Organizations:   . Attends Archivist Meetings:   Marland Kitchen Marital Status:   Intimate Partner Violence:   . Fear of Current or Ex-Partner:   . Emotionally Abused:   Marland Kitchen Physically Abused:   . Sexually Abused:   :  Pertinent items are noted in HPI.  Exam: ECOG 1 General appearance: alert and cooperative appeared without distress. Head: atraumatic without any abnormalities. Eyes: conjunctivae/corneas clear. PERRL.  Sclera anicteric. Throat: lips, mucosa, and tongue normal; without oral thrush or ulcers. Resp: clear to auscultation bilaterally without rhonchi, wheezes or dullness to percussion. Cardio: regular rate and rhythm, S1, S2 normal, no murmur, click, rub or gallop GI: soft, non-tender; bowel sounds normal; no masses,  no organomegaly Skin: Skin color, texture, turgor normal. No rashes or lesions Lymph nodes: Cervical, supraclavicular, and axillary nodes normal. Neurologic: Grossly normal without any motor, sensory or deep tendon reflexes. Musculoskeletal: No joint deformity or effusion.  CBC    Component Value Date/Time   WBC 4.7 12/31/2019 1503   RBC 3.49 (L) 12/31/2019 1503   HGB 11.8 (L) 12/31/2019 1503   HCT 34.0 (L) 12/31/2019 1503   PLT 242.0 12/31/2019 1503   MCV 97.3 12/31/2019 1503   MCH 32.7 08/01/2019 0421   MCHC 34.6 12/31/2019 1503   RDW 13.5 12/31/2019 1503   LYMPHSABS 1.0 12/31/2019 1503   MONOABS 0.5 12/31/2019 1503   EOSABS 0.5  12/31/2019 1503   BASOSABS 0.1 12/31/2019 1503     NM Bone Scan Whole Body  Result Date: 02/19/2020 CLINICAL DATA:  History of prostate cancer. EXAM: NUCLEAR MEDICINE WHOLE BODY BONE SCAN TECHNIQUE: Whole body anterior and posterior images were obtained approximately 3 hours after intravenous injection of radiopharmaceutical. RADIOPHARMACEUTICALS:  21.9 mCi Technetium-69mMDP IV COMPARISON:  Bone scan 01/15/2019 CT abdomen 01/15/2019. FINDINGS: Bilateral renal function excretion noted. Stable small focal area of increased activity again noted over the left kidney, possibly within a lower pole calyx. Stable lower lumbar focal areas of increased activity most likely degenerative. New midthoracic focal area of increased activity. Thoracic spine series suggested for further evaluation. New punctate area of increased activity right posterior fourth rib. Right rib series suggested for further evaluation. Stable areas of increased activity noted over both first carpal metacarpal joints and knees, most likely degenerative. IMPRESSION: 1. Stable lower lumbar focal areas of increased activity most likely degenerative. 2. New midthoracic focal area of increased activity. Thoracic spine series suggested for further evaluation. 3. New punctate area of increased activity right posterior fourth rib. Right rib series suggested for further evaluation. Electronically Signed   By: TMarcello Moores Register   On: 02/19/2020 05:59    Assessment and Plan:    79year old man with:  1.  Advanced prostate cancer that is currently castration-sensitive with documented bone disease evident by CT scan although his bone scan on 02/19/2020 did not show any evidence of widespread metastasis.  His most recent PSA in April 2021 was 5.68 and his testosterone is less than 10.  His case was discussed today in the prostate cancer multidisciplinary clinic and imaging studies were reviewed with the reviewing radiologist.  Treatment options were  also discussed at this time with the patient and his wife.  I have reiterated today that he backbone of treating prostate cancer at this point would be androgen deprivation therapy and he has not received any treatment since July 2020.  Remains essential to treating his advanced prostate cancer especially with documented disease.  The form of androgen deprivation  therapy could be in the form of Eligard monthly, every 3 months every 4 months or every 6 months.  He has reported better symptoms with the every 34-monthinterval.  The role for a therapy escalation using androgen synthesis inhibitors, androgen receptor blockers or systemic chemotherapy was discussed today.  There is a clear overall survival advantage associated with this medication not limited to the approval.  His options would include ZNicki Reaperor Taxotere chemotherapy.  Risks and benefits and complications associated with all these agents were discussed.  He is concerned about the complication associated with Taxotere chemotherapy and Zytiga especially hypokalemia and hypertension.  He is willing to try Xtandi at this time.  Complications include fatigue, hypertension, weight gain others were reviewed.  He has also a lot of financial concerns regarding the cost of this medication and coverages.  I will try to give him an estimate for the cost of these treatments prior to starting them in the near future.  The goal of therapy would be palliative at this time and he understands that we have emphasized the importance of quality of life versus quantity.  He also understands without treatment progression of his disease and to a painful metastatic lesion could cause decline in his quality of life at that point.  Overall he is willing to try the medication if the cost is acceptable.  We will arrange follow-up in the near future to follow his progress.   2.   Androgen deprivation therapy: I recommended continuing this indefinitely once  started.  3.  Bone directed therapy: This will be addressed in the future which include adequate calcium and vitamin D supplements, dental clearance before consideration for Xgeva.   4.  Follow-up: Will be in the near future to follow his progress.   60  minutes were dedicated to this visit. The time was spent on reviewing laboratory data, imaging studies, discussing treatment options, discussing differential diagnosis and answering questions regarding future plan.     A copy of this consult has been forwarded to the requesting physician.

## 2020-02-19 NOTE — Progress Notes (Signed)
Radiation Oncology         [phone] ________________________________  Multidisciplinary Prostate Cancer Clinic  Initial Radiation Oncology Consultation  Name: Wayne Perkins MRN: [id number]  Date: 02/19/2020  DOB: 1941/07/11  CC:[name], MD  Raynelle Bring, MD   REFERRING PHYSICIAN: Raynelle Bring, MD  DIAGNOSIS: 79 y.o. 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 Malignant neoplasm of prostate metastatic to bone [facility])  C61    C79.51     HISTORY OF PRESENT ILLNESS::Wayne Perkins is a 79 y.o. gentleman with a history of biochemically recurrent prostate cancer.  He was initially diagnosed with prostate cancer while living in [place] in 2012. Per biopsy report, he had one core of Gleason 3+3 disease and a PSA around 6 at the time of diagnosis. He initialy elected to proceed in active surveillance with close monitoring of the PSA. When his PSA rose to 9 in 2015, he elected to proceed with definitive treatment with external beam radiation therapy in [place]. His original urologist retired and his care was transferred to one of his partners. Sometime thereafter, he was noted to have a rising PSA prompting a metastatic disease workup and was found to have a questionable left ischial lesion. He thus received ADT for 9 months before stopping it given his intolerability of side effects withf hot flashes, weight gain, and development of diabetes requiring treatment with metformin.   He later opted to transfer his care to Dr. Cammy Copa, at which point his PSA was undetectable. In light of his excellent response to ADT, they opted to proceed with intermittent therapy to minimize the undesirable side effects. In 06/2017, his PSA became detectable at 0.4 and rose quickly to 10.42 in 10/2018. He underwent staging scans here in [place], while visiting family in the area, on 01/15/2019 showing no evidence of  measurable metastatic disease.   He moved to [place] to be closer to his children and established care with Dr. Alinda Money in 01/2019. A repeat PSA in 02/2019 revealed a further increase to 24.7, so he resumed ADT. A repeat PSA in 08/2019 had decreased to 4.27 so the decidion was made to hold ADT due to undesirable side effects. His most recent PSA from 12/19/2019 was only slightly increased at 5.68. He underwent CT A/P on 01/29/2020 for disease restaging and this showed interval development of innumerable small sclerotic lesions scattered throughout visualized skeleton. A bone scan was also performed on 02/18/2020, showing a new focal area of increased activity in the midthoracic spine as well as a new punctate area of increased activity in the right posterior 4th rib.  The patient reviewed the recent lab and imaging results with his urologist and he has kindly been referred today to the multidisciplinary prostate cancer clinic for presentation of pathology and radiology studies in our conference for discussion of potential radiation treatment options and clinical evaluation.  PREVIOUS RADIATION THERAPY: Yes  2015: Definitive prostate radotherapy ([place], Dr. Irish Elders)  PAST MEDICAL HISTORY:  has a past medical history of Arthritis, Blood in stool, Childhood asthma, Diabetes mellitus without complication ([place]), Heart disease, Hypertension, essential, Prostate cancer ([place]), and Stroke ([place]).    PAST SURGICAL HISTORY: Past Surgical History:  Procedure Laterality Date  . CORONARY ANGIOPLASTY WITH STENT PLACEMENT    . GANGLION CYST EXCISION Right   . PROSTATE BIOPSY      FAMILY HISTORY: family history includes Depression in his brother; Early death in his brother;  Heart disease in his brother; Hyperlipidemia in his mother and sister; Hypertension in his mother; Mental illness in his brother; Stroke in his father.  SOCIAL HISTORY:  reports that he has quit smoking. His smoking use included cigarettes. He has never used  smokeless tobacco. He reports previous alcohol use. He reports that he does not use drugs.  ALLERGIES: Glipizide and Statins  MEDICATIONS:  Current Outpatient Medications  Medication Sig Dispense Refill  . Alcohol Swabs (B-D SINGLE USE SWABS REGULAR) PADS     . betamethasone dipropionate 0.05 % cream Apply topically 2 (two) times daily. 50 g 1  . Blood Glucose Calibration (ACCU-CHEK AVIVA) SOLN     . blood glucose meter kit and supplies Use once a day in the AM for fasting sugars. 1 each 0  . Cholecalciferol (VITAMIN D3) 50 MCG (2000 UT) TABS Take by mouth.    . clopidogrel (PLAVIX) 75 MG tablet TAKE 1 TABLET EVERY DAY 90 tablet 1  . Dulaglutide (TRULICITY) 8.65 HQ/4.6NG SOPN Inject 0.75 mg into the skin once a week. 4 pen 0  . Ez Smart Blood Glucose Lancets MISC by Does not apply route. 100 each 3  . ezetimibe (ZETIA) 10 MG tablet Take 1 tablet (10 mg total) by mouth daily. 90 tablet 3  . glucose blood test strip Use as instructed 100 each 3  . hydrochlorothiazide (HYDRODIURIL) 25 MG tablet Take 25 mg by mouth daily.    . hydrOXYzine (ATARAX/VISTARIL) 10 MG tablet Take 1 tablet (10 mg total) by mouth 3 (three) times daily as needed for itching. 30 tablet 0  . lisinopril (ZESTRIL) 2.5 MG tablet Take 1 tablet (2.5 mg total) by mouth daily. 90 tablet 3  . Multiple Vitamin (MULTI-DAY VITAMINS PO) Take by mouth.    . Multiple Vitamins-Minerals (PRESERVISION AREDS PO) Take by mouth. Soft gels    . NIFEdipine (PROCARDIA XL/NIFEDICAL-XL) 90 MG 24 hr tablet TAKE 1 TABLET EVERY DAY 90 tablet 1  . polyethylene glycol (MIRALAX / GLYCOLAX) 17 g packet Take 17 g by mouth daily.    . potassium chloride SA (KLOR-CON) 20 MEQ tablet TAKE 1 TABLET TWICE DAILY 180 tablet 0  . Saw Palmetto 450 MG CAPS Take by mouth.    . vitamin B-12 (CYANOCOBALAMIN) 1000 MCG tablet Take 1,000 mcg by mouth daily.    . enzalutamide (XTANDI) 40 MG tablet Take 4 tablets (160 mg total) by mouth daily. (Patient not taking:  Reported on 02/19/2020) 120 tablet 0   No current facility-administered medications for this encounter.    REVIEW OF SYSTEMS:  On review of systems, the patient reports that he is doing well overall. He denies any chest pain, shortness of breath, cough, fevers, chills, night sweats, unintended weight changes. He denies any bowel disturbances, and denies abdominal pain, nausea or vomiting. He denies any new musculoskeletal or joint aches or pains. His IPSS was 10, indicating moderate urinary symptoms with postvoid dribble and hematuria. His SHIM was 7, indicating he does have erectile dysfunction. A complete review of systems is obtained and is otherwise negative.   PHYSICAL EXAM:  Wt Readings from Last 3 Encounters:  02/19/20 171 lb 9.6 oz (77.8 kg)  02/14/20 168 lb 12.8 oz (76.6 kg)  12/31/19 171 lb 3.2 oz (77.7 kg)   Temp Readings from Last 3 Encounters:  02/19/20 (!) 97.4 F (36.3 C)  02/14/20 97.9 F (36.6 C) (Temporal)  12/31/19 97.6 F (36.4 C) (Temporal)   BP Readings from Last 3 Encounters:  02/19/20 Marland Kitchen)  141/79  02/14/20 134/80  12/31/19 140/80   Pulse Readings from Last 3 Encounters:  02/19/20 81  02/14/20 71  12/31/19 81   Pain Assessment Pain Score: 0-No pain/10  In general this is a well appearing Caucasian male in no acute distress. He is alert and oriented x4 and appropriate throughout the examination. HEENT reveals that the patient is normocephalic, atraumatic. EOMs are intact. PERRLA. Skin is intact without any evidence of gross lesions. Cardiopulmonary assessment is negative for acute distress and he exhibits normal effort. The abdomen is soft, non tender, non distended. Lower extremities are negative for pretibial pitting edema, deep calf tenderness, cyanosis or clubbing.  KPS = 90  100 - Normal; no complaints; no evidence of disease. 90   - Able to carry on normal activity; minor signs or symptoms of disease. 80   - Normal activity with effort; some signs or  symptoms of disease. 41   - Cares for self; unable to carry on normal activity or to do active work. 60   - Requires occasional assistance, but is able to care for most of his personal needs. 50   - Requires considerable assistance and frequent medical care. 75   - Disabled; requires special care and assistance. 58   - Severely disabled; hospital admission is indicated although death not imminent. 45   - Very sick; hospital admission necessary; active supportive treatment necessary. 10   - Moribund; fatal processes progressing rapidly. 0     - Dead  Karnofsky DA, Abelmann Redmon, Craver LS and Burchenal JH [phone]) The use of the nitrogen mustards in the palliative treatment of carcinoma: with particular reference to bronchogenic carcinoma Cancer 1 634-56   LABORATORY DATA:  Lab Results  Component Value Date   WBC 4.7 12/31/2019   HGB 11.8 (L) 12/31/2019   HCT 34.0 (L) 12/31/2019   MCV 97.3 12/31/2019   PLT 242.0 12/31/2019   Lab Results  Component Value Date   NA 139 02/04/2020   K 4.0 02/04/2020   CL 102 02/04/2020   CO2 30 02/04/2020   Lab Results  Component Value Date   ALT 11 12/31/2019   AST 16 12/31/2019   ALKPHOS 49 12/31/2019   BILITOT 0.4 12/31/2019     RADIOGRAPHY: NM Bone Scan Whole Body  Result Date: 02/19/2020 CLINICAL DATA:  History of prostate cancer. EXAM: NUCLEAR MEDICINE WHOLE BODY BONE SCAN TECHNIQUE: Whole body anterior and posterior images were obtained approximately 3 hours after intravenous injection of radiopharmaceutical. RADIOPHARMACEUTICALS:  21.9 mCi Technetium-76mMDP IV COMPARISON:  Bone scan 01/15/2019 CT abdomen 01/15/2019. FINDINGS: Bilateral renal function excretion noted. Stable small focal area of increased activity again noted over the left kidney, possibly within a lower pole calyx. Stable lower lumbar focal areas of increased activity most likely degenerative. New midthoracic focal area of increased activity. Thoracic spine series suggested for  further evaluation. New punctate area of increased activity right posterior fourth rib. Right rib series suggested for further evaluation. Stable areas of increased activity noted over both first carpal metacarpal joints and knees, most likely degenerative. IMPRESSION: 1. Stable lower lumbar focal areas of increased activity most likely degenerative. 2. New midthoracic focal area of increased activity. Thoracic spine series suggested for further evaluation. 3. New punctate area of increased activity right posterior fourth rib. Right rib series suggested for further evaluation. Electronically Signed   By: TMarcello Moores Register   On: 02/19/2020 05:59      IMPRESSION/PLAN: 79y.o. gentleman with castration-sensitive metastatic prostate  cancer with disease to the bony skeleton.  Today, we talked to the patient and his wife about the findings and workup thus far. We discussed the natural history of metastatic prostate cancer and general treatment, highlighting the role of radiotherapy in the management. At this pont, the consensus recommendation is to restart ADT with the addition of Xtandi under the care and direction of Dr. Alen Blew. If there is evidence of disease progression despite these treatments, we could consider proceeding with Xofigo infusions at that time. The logistics of delivery and associated side effects were discussed. We also discussed the role of palliative radiation therapy to treat any focal sites of painful bony metastasis. Currently, he is not having any pain so this is not necessary. We also discussed the role of stereotactic body radiation therapy (SBRT) to spot treat active metastatic deposits in the setting of a few spots that are not responding to therapy while others are. Finally, we also briefly discussed PSMA as an up-and-coming treatment option for castrate-resistant prostate cancer. We discussed that this option is not currently FDA approved but could likely be a treatment option in the  next couple of years.  He and his wife were encouraged to ask questions that were answered to their stated satisfaction.  At the end of our conversation, the patient and his wife appeared to have a good understanding of his disease and our recommendations and was comfortable proceeding with resuming ADT along with the addition of Xtandi under the care and direction of Dr. Alen Blew. They understand that we are happy to continue to participate in his care if clinically indicated in the future. We enjoyed meeting him and his wife today and look forward to following along in his progress.    Nicholos Johns, PA-C    Tyler Pita, MD  [facility] Direct Dial: [phone]  Fax: [phone] [email or web address]  Skype  [email or web address]   This document serves as a record of services personally performed by Tyler Pita, MD and Freeman Caldron, PA-C. It was created on their behalf by Wilburn Mylar, a trained medical scribe. The creation of this record is based on the scribe's personal observations and the provider's statements to them. This document has been checked and approved by the attending provider.

## 2020-02-20 ENCOUNTER — Telehealth: Payer: Self-pay | Admitting: Oncology

## 2020-02-20 ENCOUNTER — Other Ambulatory Visit: Payer: Self-pay | Admitting: Family Medicine

## 2020-02-20 NOTE — Telephone Encounter (Signed)
Scheduled per 6/22 sch message. Called the number in chart and person stated wrong number. Mailing calendar to pt.

## 2020-02-22 ENCOUNTER — Telehealth: Payer: Self-pay

## 2020-02-22 NOTE — Telephone Encounter (Signed)
Oral Oncology Patient Advocate Encounter  Met patient in [place] to complete an application for [employer] in an effort to reduce the patient's out of pocket expense for Xtandi to $0.    Application completed and faxed to [phone].   [place] phone number for follow up is [phone].   This encounter will be updated until final determination.   [place] Patient [place] Phone [phone] Fax [phone] 02/22/2020 10:29 AM

## 2020-02-22 NOTE — Telephone Encounter (Signed)
Oral Chemotherapy Pharmacist Encounter   Attempted to call patient for medication education. No answer, LVM for patient to call me back.  Darl Pikes, PharmD, BCPS, BCOP, CPP Hematology/Oncology Clinical Pharmacist [facility] [phone]  02/22/2020 3:39 PM

## 2020-02-25 NOTE — Telephone Encounter (Signed)
Oral Chemotherapy Pharmacist Encounter  Wayne Perkins was provided Xtandi to get started while his application for manufacturer assistance is pending.  Patient Education I spoke with patient for overview of new oral chemotherapy medication: Xtandi (enzalutamide) for the treatment of metastatic castrate resistant prostate cancer, planned duration until disease progression or unacceptable drug toxicity.   Pt is doing well. Counseled patient on administration, dosing, side effects, monitoring, drug-food interactions, safe handling, storage, and disposal. Patient will take 4 tablets (160 mg total) by mouth daily.  Side effects include but not limited to: fatigue, risk of seizure, hot flashes.    Reviewed with patient importance of keeping a medication schedule and plan for any missed doses.  Mr. Starace voiced understanding and appreciation. All questions answered. Medication handout placed in the mail.  Provided patient with [facility] phone number. Patient knows to call the office with questions or concerns. Oral Chemotherapy Navigation Clinic will continue to follow.  Darl Pikes, PharmD, BCPS, BCOP, CPP Hematology/Oncology Clinical Pharmacist Practitioner [facility] [phone]  02/25/2020 11:54 AM

## 2020-02-27 ENCOUNTER — Telehealth: Payer: Self-pay | Admitting: Oncology

## 2020-02-27 NOTE — Telephone Encounter (Signed)
Scheduled appt per 6/29 sch message =- unable to reach pt .left message with appt date and time for patient

## 2020-02-28 ENCOUNTER — Encounter: Payer: Self-pay | Admitting: Medical Oncology

## 2020-02-28 NOTE — Progress Notes (Signed)
Spoke with patient as follow up to [facility]. He states he is doing well and just heard he has been accepted for financial assistance for [place]. He is so grateful. He is scheduled for [place]lig[place] 7/2, @ 12:15 pm. He is worried about cost because he does not have drug coverage with his Medicare. I will reach out to [facility] regarding cost and assistance.

## 2020-02-28 NOTE — Telephone Encounter (Signed)
Patient is approved 02/25/20-08/29/20  [facility] Support Phone [phone]  Delsin Copen CPHT [facility] Patient [place] Phone [phone] Fax [phone] 02/28/2020 9:48 AM

## 2020-02-29 ENCOUNTER — Encounter: Payer: Self-pay | Admitting: Medical Oncology

## 2020-02-29 ENCOUNTER — Inpatient Hospital Stay: Payer: Medicare HMO | Attending: Oncology

## 2020-02-29 ENCOUNTER — Telehealth: Payer: Self-pay | Admitting: Medical Oncology

## 2020-02-29 ENCOUNTER — Other Ambulatory Visit: Payer: Self-pay

## 2020-02-29 VITALS — BP 151/91 | HR 71 | Temp 97.7°F | Resp 18

## 2020-02-29 DIAGNOSIS — C61 Malignant neoplasm of prostate: Secondary | ICD-10-CM | POA: Insufficient documentation

## 2020-02-29 DIAGNOSIS — R3121 Asymptomatic microscopic hematuria: Secondary | ICD-10-CM | POA: Diagnosis not present

## 2020-02-29 MED ORDER — LEUPROLIDE ACETATE (3 MONTH) 22.5 MG ~~LOC~~ KIT
PACK | SUBCUTANEOUS | Status: AC
  Filled 2020-02-29: qty 22.5

## 2020-02-29 MED ORDER — LEUPROLIDE ACETATE (3 MONTH) 22.5 MG ~~LOC~~ KIT
22.5000 mg | PACK | Freq: Once | SUBCUTANEOUS | Status: AC
  Administered 2020-02-29: 22.5 mg via SUBCUTANEOUS

## 2020-02-29 NOTE — Progress Notes (Signed)
Patient had appointment with Dr. Alinda Money his morning at [facility]. He is scheduled for Lupron injection at 12:pm. He would like to know if he could get the injection while in town. I spoke with injection and they will be happy to work him in.

## 2020-02-29 NOTE — Progress Notes (Signed)
I called [employer] to investigate the cost of Eligard for patient. He is concerned because he is in the insurance "hole". Per the speciality pharmacy the cost for patient will be approximately  $[phone]. I called patient with this information.

## 2020-02-29 NOTE — Progress Notes (Signed)
Left message to let him know [facility]) is trying to reach him to discuss shipment of medication. I asked him to call [phone].

## 2020-02-29 NOTE — Progress Notes (Signed)
Wayne Perkins here for ADT injection. He is concerned because he does not have extra drug coverage with his insurance policy and he is in the "hole". I told him that the most important thing is to receive the medication and we will work on getting his patient assistance if needed. I will reach out to his insurance company to see what the possible cost will be to him. He voiced his appreciation of the care he is receiving here in [place] after relocating from [place]. He had cysto with Dr. Alinda Money this morning and was happy it was normal. I asked if he has heard from [place] since he heard he received assistance and he has not. I will follow up with our pharmacy regarding where the medication will be filled.

## 2020-02-29 NOTE — Telephone Encounter (Signed)
Returned call to patient to let him know he can come on to the [facility] and the injection nurse will [place] him. He was very appreciative of [place] working with him.

## 2020-02-29 NOTE — Patient Instructions (Signed)

## 2020-02-29 NOTE — Progress Notes (Signed)
Reference number for Human #[id number]

## 2020-03-07 ENCOUNTER — Telehealth: Payer: Self-pay

## 2020-03-07 NOTE — Telephone Encounter (Addendum)
Pt has been approved for [employer] Patient Assistance Program on 02/20/2020. He will be enrolled in [place] until the end of the calendar year. The medication they will cover is Trulicity.

## 2020-03-14 ENCOUNTER — Other Ambulatory Visit: Payer: Self-pay | Admitting: Oncology

## 2020-03-20 ENCOUNTER — Telehealth: Payer: Self-pay

## 2020-03-20 NOTE — Telephone Encounter (Signed)
Called patient and made him aware that Dr. Alen Blew has been made aware and has no further recommendations at this time. Patient verbalized understanding and will contact the office with any further questions or concerns.

## 2020-03-20 NOTE — Telephone Encounter (Signed)
-----   Message from Wyatt Portela, MD sent at 03/20/2020  2:23 PM EDT ----- No further recommendations at this time.  Thanks ----- Message ----- From: Tami Lin, RN Sent: 03/20/2020   2:02 PM EDT To: Wyatt Portela, MD  Patient called and states he started [place] about 3 weeks ago. He states after the first week he had muscle aches, insomnia, and some lower leg and hip pain that has gotten worse over the weeks. Per patient he is unable to sleep and keeps his wife up at night. Patient is taking Tylenol 325 mg and that helps some. He said he is able to sleep when the Tylenol eventually kicks in. I told him that these symptoms are some of the side effects associated with Xtandi. He is planning an out of town trip in the next few weeks and wants you to be aware and to see if he can do anything different.  Lanelle Bal

## 2020-03-27 ENCOUNTER — Encounter: Payer: Self-pay | Admitting: Medical Oncology

## 2020-03-27 NOTE — Progress Notes (Signed)
Patient called stating he has a follow up with Dr. Alen Blew, 8/3 and wanted to update [place] before his visit. He took [place] for 3 weeks but had life altering side effects. He had insomnia,back pain and muscle aching. He took Tylenol but it did not help. He made the decision last week to stop the [place]. He is feeling 100 % better since stopping the medication. He continues to have some muscle aches but continues to see improvement daily. He is willing to continue ADT. He has taken ADT in the past and has been able to tolerate. He is looking for quality of life and being able to function. He is not sure if there is another drug he can try. I told him, he can discuss with Dr. Alen Blew next week at follow up appointment.

## 2020-03-27 NOTE — Progress Notes (Signed)
Returning call to patient and his wife but had to leave message. I asked them to call me.

## 2020-04-01 ENCOUNTER — Encounter: Payer: Self-pay | Admitting: Medical Oncology

## 2020-04-01 NOTE — Progress Notes (Signed)
Patient called asking if he can bring his wife for follow up visit with Dr. Alen Blew. I told him, yes and they will need to wear a mask. He voiced understanding.

## 2020-04-02 ENCOUNTER — Telehealth: Payer: Self-pay | Admitting: Oncology

## 2020-04-02 ENCOUNTER — Inpatient Hospital Stay: Payer: Medicare HMO

## 2020-04-02 ENCOUNTER — Inpatient Hospital Stay: Payer: Medicare HMO | Attending: Oncology | Admitting: Oncology

## 2020-04-02 ENCOUNTER — Other Ambulatory Visit: Payer: Self-pay

## 2020-04-02 VITALS — BP 161/95 | HR 81 | Temp 97.7°F | Resp 17 | Wt 169.8 lb

## 2020-04-02 DIAGNOSIS — C7951 Secondary malignant neoplasm of bone: Secondary | ICD-10-CM | POA: Diagnosis not present

## 2020-04-02 DIAGNOSIS — C61 Malignant neoplasm of prostate: Secondary | ICD-10-CM

## 2020-04-02 DIAGNOSIS — R232 Flushing: Secondary | ICD-10-CM | POA: Insufficient documentation

## 2020-04-02 LAB — CMP (CANCER CENTER ONLY)
ALT: 15 U/L (ref 0–44)
AST: 23 U/L (ref 15–41)
Albumin: 3.8 g/dL (ref 3.5–5.0)
Alkaline Phosphatase: 55 U/L (ref 38–126)
Anion gap: 10 (ref 5–15)
BUN: 37 mg/dL — ABNORMAL HIGH (ref 8–23)
CO2: 27 mmol/L (ref 22–32)
Calcium: 10.1 mg/dL (ref 8.9–10.3)
Chloride: 104 mmol/L (ref 98–111)
Creatinine: 1.73 mg/dL — ABNORMAL HIGH (ref 0.61–1.24)
GFR, Est AFR Am: 43 mL/min — ABNORMAL LOW (ref >60)
GFR, Estimated: 37 mL/min — ABNORMAL LOW (ref >60)
Glucose, Bld: 117 mg/dL — ABNORMAL HIGH (ref 70–99)
Potassium: 3.7 mmol/L (ref 3.5–5.1)
Sodium: 141 mmol/L (ref 135–145)
Total Bilirubin: 0.5 mg/dL (ref 0.3–1.2)
Total Protein: 6.8 g/dL (ref 6.5–8.1)

## 2020-04-02 LAB — CBC WITH DIFFERENTIAL (CANCER CENTER ONLY)
Abs Immature Granulocytes: 0.02 10*3/uL (ref 0.00–0.07)
Basophils Absolute: 0 10*3/uL (ref 0.0–0.1)
Basophils Relative: 1 %
Eosinophils Absolute: 0.4 10*3/uL (ref 0.0–0.5)
Eosinophils Relative: 9 %
HCT: 33.4 % — ABNORMAL LOW (ref 39.0–52.0)
Hemoglobin: 11.4 g/dL — ABNORMAL LOW (ref 13.0–17.0)
Immature Granulocytes: 0 %
Lymphocytes Relative: 16 %
Lymphs Abs: 0.8 10*3/uL (ref 0.7–4.0)
MCH: 32.9 pg (ref 26.0–34.0)
MCHC: 34.1 g/dL (ref 30.0–36.0)
MCV: 96.5 fL (ref 80.0–100.0)
Monocytes Absolute: 0.6 10*3/uL (ref 0.1–1.0)
Monocytes Relative: 12 %
Neutro Abs: 2.9 10*3/uL (ref 1.7–7.7)
Neutrophils Relative %: 62 %
Platelet Count: 226 10*3/uL (ref 150–400)
RBC: 3.46 MIL/uL — ABNORMAL LOW (ref 4.22–5.81)
RDW: 13.1 % (ref 11.5–15.5)
WBC Count: 4.7 10*3/uL (ref 4.0–10.5)
nRBC: 0 % (ref 0.0–0.2)

## 2020-04-02 MED ORDER — CALCIUM CARBONATE-VITAMIN D 500-200 MG-UNIT PO TABS
1.0000 | ORAL_TABLET | Freq: Two times a day (BID) | ORAL | 3 refills | Status: DC
Start: 2020-04-02 — End: 2020-09-04

## 2020-04-02 NOTE — Telephone Encounter (Signed)
Scheduled per 08/04 los, patient received after visit summary and calender.

## 2020-04-02 NOTE — Progress Notes (Signed)
Hematology and Oncology Follow Up Visit  Wayne Perkins [id number] Jun 07, 1941 79 y.o. 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  He was initially diagnosed in 2015 with a Gleason score of 6 and a PSA of 6.   Prior Therapy:  He was on active surveillance for a period of time and subsequently had a rise in his PSA that necessitated definitive treatment and received external beam radiation as a definitive treatment.    His PSA continues to rise subsequently and was started on androgen deprivation therapy for biochemically recurrent disease.  His PSA decreased down to 0.4 in November 2018.  Due to hot flashes and increased toxicities he was receiving it intermittently and was off androgen deprivation therapy in 2019 with a PSA rise up to 10.9.      He did receive another 6 months of androgen deprivation injection under the care of Dr. Alinda Perkins and received ADT at that time after his PSA was up to 24.7.  January 2021 is PSA was 4.27 and stopped ADT.    He started developing hematuria and subsequently had a CT scan hematuria protocol in June 2021.  Small sclerotic lesions noted throughout the visualized widespread metastatic disease.  His PSA at that time was 5.68 in April 2021.  Bone scan at that time did not show any evidence of metastatic disease except for a new midthoracic focal area of increased activity.      Current therapy:  Eligard 22.5 mg every 3 months restarted on February 29, 2020.  Xtandi 160 mg daily started in June 2021.  Therapy discontinued because of poor tolerance after 3 weeks.   Interim History: Wayne Perkins returns today for a follow-up visit.  Since the last visit, he reports no major changes in his health.  He started [name] and was poorly tolerated.  He had issues with arthralgias and myalgias and had to stop taking it.  After  discontinuation, his symptoms has improved.  He is no longer reporting any bone pain or discomfort.  He does report some occasional stiffness.  He denies any recent hospitalization or illnesses.     Medications: I have reviewed the patient's current medications.  Current Outpatient Medications  Medication Sig Dispense Refill  . Alcohol Swabs (B-D SINGLE USE SWABS REGULAR) PADS     . betamethasone dipropionate 0.05 % cream Apply topically 2 (two) times daily. 50 g 1  . Blood Glucose Calibration (ACCU-CHEK AVIVA) SOLN     . blood glucose meter kit and supplies Use once a day in the AM for fasting sugars. 1 each 0  . Cholecalciferol (VITAMIN D3) 50 MCG (2000 UT) TABS Take by mouth.    . clopidogrel (PLAVIX) 75 MG tablet TAKE 1 TABLET EVERY DAY 90 tablet 1  . Dulaglutide (TRULICITY) 3.57 SV/7.7LT SOPN Inject 0.75 mg into the skin once a week. 4 pen 0  . Ez Smart Blood Glucose Lancets MISC by Does not apply route. 100 each 3  . ezetimibe (ZETIA) 10 MG tablet Take 1 tablet (10 mg total) by mouth daily. 90 tablet 3  . glucose blood test strip Use as instructed 100 each 3  . hydrochlorothiazide (HYDRODIURIL) 25 MG tablet TAKE 1 TABLET (25 MG TOTAL) BY MOUTH DAILY. 90 tablet 3  . hydrOXYzine (ATARAX/VISTARIL) 10 MG tablet Take 1 tablet (10 mg total) by mouth 3 (three) times daily as needed for itching. [name]  tablet 0  . lisinopril (ZESTRIL) 2.5 MG tablet Take 1 tablet (2.5 mg total) by mouth daily. 90 tablet 3  . Multiple Vitamin (MULTI-DAY VITAMINS PO) Take by mouth.    . Multiple Vitamins-Minerals (PRESERVISION AREDS PO) Take by mouth. Soft gels    . NIFEdipine (PROCARDIA XL/NIFEDICAL-XL) 90 MG 24 hr tablet TAKE 1 TABLET EVERY DAY 90 tablet 1  . polyethylene glycol (MIRALAX / GLYCOLAX) 17 g packet Take 17 g by mouth daily.    . potassium chloride SA (KLOR-CON) 20 MEQ tablet TAKE 1 TABLET TWICE DAILY 180 tablet 0  . Saw Palmetto 450 MG CAPS Take by mouth.    . vitamin B-12 (CYANOCOBALAMIN) 1000 MCG  tablet Take 1,000 mcg by mouth daily.    [name] Take 4 tablets (149m) by mouth once daily as directed by physician 120 tablet 0   No current facility-administered medications for this visit.     Allergies:  Allergies  Allergen Reactions  . Glipizide Itching  . Statins Other (See Comments)    Causes narcolepsy      Physical Exam: Blood pressure (!) 161/95, pulse 81, temperature 97.7 F (36.5 C), temperature source Temporal, resp. rate 17, weight 169 lb 12.8 oz (77 kg), SpO2 99 %.   ECOG: 1    General appearance: Comfortable appearing without any discomfort Head: Normocephalic without any trauma Oropharynx: Mucous membranes are moist and pink without any thrush or ulcers. Eyes: Pupils are equal and round reactive to light. Lymph nodes: No cervical, supraclavicular, inguinal or axillary lymphadenopathy.   Heart:regular rate and rhythm.  S1 and S2 without leg edema. Lung: Clear without any rhonchi or wheezes.  No dullness to percussion. Abdomin: Soft, nontender, nondistended with good bowel sounds.  No hepatosplenomegaly. Musculoskeletal: No joint deformity or effusion.  Full range of motion noted. Neurological: No deficits noted on motor, sensory and deep tendon reflex exam. Skin: No petechial rash or dryness.  Appeared moist.      Lab Results: Lab Results  Component Value Date   WBC 4.7 04/02/2020   HGB 11.4 (L) 04/02/2020   HCT 33.4 (L) 04/02/2020   MCV 96.5 04/02/2020   PLT 226 04/02/2020     Chemistry      Component Value Date/Time   NA 139 02/04/2020 0956   K 4.0 02/04/2020 0956   CL 102 02/04/2020 0956   CO2 30 02/04/2020 0956   BUN 35 (H) 02/04/2020 0956   CREATININE 1.52 (H) 02/04/2020 0956      Component Value Date/Time   CALCIUM 9.8 02/04/2020 0956   ALKPHOS 49 12/31/2019 1503   AST 16 12/31/2019 1503   ALT 11 12/31/2019 1503   BILITOT 0.4 12/31/2019 1503         Impression and Plan:   79year old man with:  1.   Castration-sensitive prostate cancer with disease to the bone documented in June 2021.  He was initially diagnosed with localized disease in 2015.  Treatment options for this cancer long-term were discussed.  He has been started on Xtandi although was poorly tolerated.  Treatment options would include androgen deprivation therapy alone which is an inferior option.  Switching to Zytiga or dose reduction of Xtandi also consideration.  Systemic chemotherapy would be an option as well.  After discussion today, he has elected to proceed with androgen deprivation therapy alone.  He understands this is an anterior option but he values quality of life at this time and does not want to add any additional  therapy.    2.   Androgen deprivation therapy: He is currently on Eligard which she has tolerated reasonably well.  This will be given every 3 months indefinitely.  Long-term complications including hot flashes and weight gain.  3.  Bone directed therapy: I recommended calcium and vitamin D supplements.  After obtaining dental clearance we will discuss Xgeva.   4.  Follow-up: He will return in 2 months for his next Eligard injection.   30  minutes were spent on this encounter.  The time was dedicated to reviewing his disease status, reviewing laboratory data treatment options and future plan of care review.   Zola Button, MD 8/4/20219:28 AM

## 2020-04-03 ENCOUNTER — Telehealth: Payer: Self-pay

## 2020-04-03 LAB — PROSTATE-SPECIFIC AG, SERUM (LABCORP): Prostate Specific Ag, Serum: 1.1 ng/mL (ref 0.0–4.0)

## 2020-04-03 NOTE — Telephone Encounter (Signed)
-----   Message from Wyatt Portela, MD sent at 04/03/2020  8:40 AM EDT ----- Please let him know his PSA is down

## 2020-04-03 NOTE — Telephone Encounter (Signed)
Called and informed patient of PSA level. Patient verbalized understanding.  All questions were answered during the phone call.

## 2020-04-06 ENCOUNTER — Other Ambulatory Visit: Payer: Self-pay | Admitting: Family Medicine

## 2020-04-15 ENCOUNTER — Encounter: Payer: Self-pay | Admitting: Oncology

## 2020-04-15 NOTE — Progress Notes (Signed)
Received referral from Drug Assistance(Crystal) inquiring about copay assistance for Leuprolide through [employer].  Called patient to ask pre-screening questions and obtain permission to apply. Patient consented and provided information.  Applied online on his behalf. Patient approved conditionally 04/15/20 - 05/15/20, pending signature which will cover the amount of copay remaining after insurance pays, leaving patient with a $0 balance. Called TAF(Shalonda) to obtain information on next steps and how to submit claims. Patient will receive packet in the mail to sign and return. Once approved, they will retro back 08/31/19. Documents must be received by the date included in letter to remain eligible.   Called patient back to explain the next steps and what is needed from him. He verbalized understanding.  Provided him with my contact name and number once he has completed his portion and sent back.  Information will be provided to [facility] for billing/claim submissions.

## 2020-04-16 ENCOUNTER — Telehealth: Payer: Self-pay | Admitting: Family Medicine

## 2020-04-16 ENCOUNTER — Other Ambulatory Visit: Payer: Self-pay

## 2020-04-16 ENCOUNTER — Other Ambulatory Visit: Payer: Medicare HMO

## 2020-04-16 DIAGNOSIS — E1121 Type 2 diabetes mellitus with diabetic nephropathy: Secondary | ICD-10-CM

## 2020-04-16 DIAGNOSIS — Z1159 Encounter for screening for other viral diseases: Secondary | ICD-10-CM | POA: Diagnosis not present

## 2020-04-16 MED ORDER — HYDROXYZINE HCL 10 MG PO TABS: 10.0000 mg | ORAL_TABLET | Freq: Three times a day (TID) | ORAL | 0 refills | Status: DC | PRN

## 2020-04-16 NOTE — Telephone Encounter (Signed)
Sent to Pharmacy. 

## 2020-04-16 NOTE — Telephone Encounter (Signed)
MEDICATION: hydrOXYzine (ATARAX/VISTARIL) 10 MG tablet  PHARMACY:  [id number] - [place], [place] [address] Phone:  [phone]  Fax:  [phone]       Comments:   **Let patient know to contact pharmacy at the end of the day to make sure medication is ready. **  ** Please notify patient to allow 48-72 hours to process**  **Encourage patient to contact the pharmacy for refills or they can request refills through [facility]**

## 2020-04-16 NOTE — Addendum Note (Signed)
Addended by: Liliane Channel on: 04/16/2020 08:00 AM   Modules accepted: Orders

## 2020-04-17 LAB — HEMOGLOBIN A1C
Hgb A1c MFr Bld: 6.3 %{Hb} — ABNORMAL HIGH
Mean Plasma Glucose: 134 (calc)
eAG (mmol/L): 7.4 (calc)

## 2020-04-17 LAB — BASIC METABOLIC PANEL WITH GFR
BUN/Creatinine Ratio: 24 (calc) — ABNORMAL HIGH (ref 6–22)
BUN: 43 mg/dL — ABNORMAL HIGH (ref 7–25)
CO2: 29 mmol/L (ref 20–32)
Calcium: 10.3 mg/dL (ref 8.6–10.3)
Chloride: 102 mmol/L (ref 98–110)
Creat: 1.78 mg/dL — ABNORMAL HIGH (ref 0.70–1.18)
Glucose, Bld: 124 mg/dL — ABNORMAL HIGH (ref 65–99)
Potassium: 3.8 mmol/L (ref 3.5–5.3)
Sodium: 141 mmol/L (ref 135–146)

## 2020-04-17 LAB — HEPATITIS C ANTIBODY
Hepatitis C Ab: NONREACTIVE
SIGNAL TO CUT-OFF: 0.01

## 2020-04-18 ENCOUNTER — Encounter: Payer: Self-pay | Admitting: Oncology

## 2020-04-18 NOTE — Progress Notes (Signed)
Patient brought by signed pharmacy assistance application for [email or web address] . Scanned and emailed to Eva.

## 2020-04-21 ENCOUNTER — Telehealth: Payer: Self-pay

## 2020-04-21 NOTE — Telephone Encounter (Signed)
Labs mailed to home address.  

## 2020-04-21 NOTE — Telephone Encounter (Signed)
Pt would like to know if he can get a copy of his recent lab results

## 2020-04-22 ENCOUNTER — Telehealth: Payer: Self-pay | Admitting: Family Medicine

## 2020-04-22 NOTE — Telephone Encounter (Signed)
Patient is requesting a copy of his recent results to pick up today can we please print out

## 2020-04-22 NOTE — Telephone Encounter (Signed)
Patient came and picked up [facility]

## 2020-06-04 ENCOUNTER — Inpatient Hospital Stay: Payer: Medicare HMO | Admitting: Oncology

## 2020-06-04 ENCOUNTER — Other Ambulatory Visit: Payer: Self-pay

## 2020-06-04 ENCOUNTER — Inpatient Hospital Stay: Payer: Medicare HMO

## 2020-06-04 ENCOUNTER — Inpatient Hospital Stay: Payer: Medicare HMO | Attending: Oncology

## 2020-06-04 VITALS — BP 144/89 | HR 75 | Temp 97.5°F | Resp 18 | Ht 70.5 in | Wt 171.6 lb

## 2020-06-04 VITALS — BP 153/93 | HR 71 | Temp 98.0°F | Resp 18

## 2020-06-04 DIAGNOSIS — R232 Flushing: Secondary | ICD-10-CM | POA: Insufficient documentation

## 2020-06-04 DIAGNOSIS — C7951 Secondary malignant neoplasm of bone: Secondary | ICD-10-CM | POA: Diagnosis not present

## 2020-06-04 DIAGNOSIS — C61 Malignant neoplasm of prostate: Secondary | ICD-10-CM | POA: Diagnosis not present

## 2020-06-04 LAB — CBC WITH DIFFERENTIAL (CANCER CENTER ONLY)
Abs Immature Granulocytes: 0.02 10*3/uL (ref 0.00–0.07)
Basophils Absolute: 0.1 10*3/uL (ref 0.0–0.1)
Basophils Relative: 1 %
Eosinophils Absolute: 0.4 10*3/uL (ref 0.0–0.5)
Eosinophils Relative: 9 %
HCT: 34.1 % — ABNORMAL LOW (ref 39.0–52.0)
Hemoglobin: 11.7 g/dL — ABNORMAL LOW (ref 13.0–17.0)
Immature Granulocytes: 0 %
Lymphocytes Relative: 16 %
Lymphs Abs: 0.8 10*3/uL (ref 0.7–4.0)
MCH: 33 pg (ref 26.0–34.0)
MCHC: 34.3 g/dL (ref 30.0–36.0)
MCV: 96.1 fL (ref 80.0–100.0)
Monocytes Absolute: 0.5 10*3/uL (ref 0.1–1.0)
Monocytes Relative: 10 %
Neutro Abs: 3 10*3/uL (ref 1.7–7.7)
Neutrophils Relative %: 64 %
Platelet Count: 254 10*3/uL (ref 150–400)
RBC: 3.55 MIL/uL — ABNORMAL LOW (ref 4.22–5.81)
RDW: 12.9 % (ref 11.5–15.5)
WBC Count: 4.7 10*3/uL (ref 4.0–10.5)
nRBC: 0 % (ref 0.0–0.2)

## 2020-06-04 LAB — CMP (CANCER CENTER ONLY)
ALT: 15 U/L (ref 0–44)
AST: 19 U/L (ref 15–41)
Albumin: 3.7 g/dL (ref 3.5–5.0)
Alkaline Phosphatase: 55 U/L (ref 38–126)
Anion gap: 7 (ref 5–15)
BUN: 31 mg/dL — ABNORMAL HIGH (ref 8–23)
CO2: 32 mmol/L (ref 22–32)
Calcium: 9.9 mg/dL (ref 8.9–10.3)
Chloride: 102 mmol/L (ref 98–111)
Creatinine: 1.51 mg/dL — ABNORMAL HIGH (ref 0.61–1.24)
GFR, Estimated: 43 mL/min — ABNORMAL LOW (ref >60)
Glucose, Bld: 169 mg/dL — ABNORMAL HIGH (ref 70–99)
Potassium: 3.2 mmol/L — ABNORMAL LOW (ref 3.5–5.1)
Sodium: 141 mmol/L (ref 135–145)
Total Bilirubin: 0.5 mg/dL (ref 0.3–1.2)
Total Protein: 6.8 g/dL (ref 6.5–8.1)

## 2020-06-04 MED ORDER — LEUPROLIDE ACETATE (3 MONTH) 22.5 MG ~~LOC~~ KIT
22.5000 mg | PACK | Freq: Once | SUBCUTANEOUS | Status: AC
  Administered 2020-06-04: 22.5 mg via SUBCUTANEOUS

## 2020-06-04 MED ORDER — LEUPROLIDE ACETATE (3 MONTH) 22.5 MG ~~LOC~~ KIT
PACK | SUBCUTANEOUS | Status: AC
  Filled 2020-06-04: qty 22.5

## 2020-06-04 NOTE — Patient Instructions (Signed)

## 2020-06-04 NOTE — Progress Notes (Signed)
Hematology and Oncology Follow Up Visit  Wayne Perkins [id number] [birth year] 79 y.o. 06/04/2020 9:04 AM [name], MDWolfe, [name], MD   Principle Diagnosis: 79 year old with advanced prostate cancer with disease to the bone diagnosed in June 2021.  He was diagnosed in 2015 with a Gleason score of 6 and a PSA of 6 and currently has castration-sensitive disease.   Prior Therapy:  He was on active surveillance for a period of time and subsequently had a rise in his PSA that necessitated definitive treatment and received external beam radiation as a definitive treatment.    His PSA continues to rise subsequently and was started on androgen deprivation therapy for biochemically recurrent disease.  His PSA decreased down to 0.4 in November 2018.  Due to hot flashes and increased toxicities he was receiving it intermittently and was off androgen deprivation therapy in 2019 with a PSA rise up to 10.9.      He did receive another 6 months of androgen deprivation injection under the care of Dr. Alinda Money and received ADT at that time after his PSA was up to 24.7.  January 2021 is PSA was 4.27 and stopped ADT.    He started developing hematuria and subsequently had a CT scan hematuria protocol in June 2021.  Small sclerotic lesions noted throughout the visualized widespread metastatic disease.  His PSA at that time was 5.68 in April 2021.  Bone scan at that time did not show any evidence of metastatic disease except for a new midthoracic focal area of increased activity.   Xtandi 160 mg daily started in June 2021.  Therapy discontinued because of poor tolerance after 3 weeks.  Current therapy:  Eligard 22.5 mg every 3 months restarted on February 29, 2020.  Next injection is scheduled for June 04, 2020.     Interim History: [name] is here for a follow-up evaluation.  Since the last visit, he reports no major changes in his health.  He denies any complications related to Eligard.  He does report some  hot flashes although they have been limited.  He denies any bone pain or pathological fractures.  He denies any recent falls or syncope.  His performance status and quality of life remain excellent.     Medications: Updated on review. Current Outpatient Medications  Medication Sig Dispense Refill  . Alcohol Swabs (B-D SINGLE USE SWABS REGULAR) PADS     . betamethasone dipropionate 0.05 % cream Apply topically 2 (two) times daily. 50 g 1  . Blood Glucose Calibration (ACCU-CHEK AVIVA) SOLN     . blood glucose meter kit and supplies Use once a day in the AM for fasting sugars. 1 each 0  . calcium-vitamin D (OSCAL WITH D) 500-200 MG-UNIT tablet Take 1 tablet by mouth 2 (two) times daily. 60 tablet 3  . Cholecalciferol (VITAMIN D3) 50 MCG (2000 UT) TABS Take by mouth.    . clopidogrel (PLAVIX) 75 MG tablet TAKE 1 TABLET EVERY DAY 90 tablet 1  . Dulaglutide (TRULICITY) 4.92 EF/0.0FH SOPN Inject 0.75 mg into the skin once a week. 4 pen 0  . Ez Smart Blood Glucose Lancets MISC by Does not apply route. 100 each 3  . ezetimibe (ZETIA) 10 MG tablet Take 1 tablet (10 mg total) by mouth daily. 90 tablet 3  . glucose blood test strip Use as instructed 100 each 3  . hydrochlorothiazide (HYDRODIURIL) 25 MG tablet TAKE 1 TABLET (25 MG TOTAL) BY MOUTH DAILY. 90 tablet 3  . hydrOXYzine (ATARAX/VISTARIL)  10 MG tablet Take 1 tablet (10 mg total) by mouth 3 (three) times daily as needed for itching. 30 tablet 0  . lisinopril (ZESTRIL) 2.5 MG tablet Take 1 tablet (2.5 mg total) by mouth daily. 90 tablet 3  . Multiple Vitamin (MULTI-DAY VITAMINS PO) Take by mouth.    . Multiple Vitamins-Minerals (PRESERVISION AREDS PO) Take by mouth. Soft gels    . NIFEdipine (PROCARDIA XL/NIFEDICAL-XL) 90 MG 24 hr tablet TAKE 1 TABLET EVERY DAY 90 tablet 1  . polyethylene glycol (MIRALAX / GLYCOLAX) 17 g packet Take 17 g by mouth daily.    . potassium chloride SA (KLOR-CON) 20 MEQ tablet TAKE 1 TABLET TWICE DAILY 180 tablet 0  .  Saw Palmetto 450 MG CAPS Take by mouth.    . vitamin B-12 (CYANOCOBALAMIN) 1000 MCG tablet Take 1,000 mcg by mouth daily.    [name] Take 4 tablets (115m) by mouth once daily as directed by physician (Patient not taking: Reported on 04/02/2020) 120 tablet 0   No current facility-administered medications for this visit.     Allergies:  Allergies  Allergen Reactions  . Glipizide Itching  . Statins Other (See Comments)    Causes narcolepsy      Physical Exam: Blood pressure (!) 144/89, pulse 75, temperature (!) 97.5 F (36.4 C), temperature source Tympanic, resp. rate 18, height 5' 10.5" (1.791 m), weight 171 lb 9.6 oz (77.8 kg), SpO2 99 %.   ECOG: 1   General appearance: Alert, awake without any distress. Head: Atraumatic without abnormalities Oropharynx: Without any thrush or ulcers. Eyes: No scleral icterus. Lymph nodes: No lymphadenopathy noted in the cervical, supraclavicular, or axillary nodes Heart:regular rate and rhythm, without any murmurs or gallops.   Lung: Clear to auscultation without any rhonchi, wheezes or dullness to percussion. Abdomin: Soft, nontender without any shifting dullness or ascites. Musculoskeletal: No clubbing or cyanosis. Neurological: No motor or sensory deficits. Skin: No rashes or lesions.      Lab Results: Lab Results  Component Value Date   WBC 4.7 04/02/2020   HGB 11.4 (L) 04/02/2020   HCT 33.4 (L) 04/02/2020   MCV 96.5 04/02/2020   PLT 226 04/02/2020     Chemistry      Component Value Date/Time   NA 141 04/16/2020 0806   K 3.8 04/16/2020 0806   CL 102 04/16/2020 0806   CO2 29 04/16/2020 0806   BUN 43 (H) 04/16/2020 0806   CREATININE 1.78 (H) 04/16/2020 0806      Component Value Date/Time   CALCIUM 10.3 04/16/2020 0806   ALKPHOS 55 04/02/2020 0915   AST 23 04/02/2020 0915   ALT 15 04/02/2020 0915   BILITOT 0.5 04/02/2020 0915     Results for GMICHIO, THIER(MRN [id number] as of 06/04/2020 09:06  Ref.  Range 05/14/2019 11:24 04/02/2020 09:15  PSA Latest Ref Range: 0.10 - 4.00 ng/mL 2.02   Prostate Specific Ag, Serum Latest Ref Range: 0.0 - 4.0 ng/mL  1.1      Impression and Plan:   79year old man with:  1.  Advanced prostate cancer with disease to the bone after initially diagnosed with localized disease in 2015.  He has castration-sensitive disease at this time.  The natural course of his disease was reviewed.  Laboratory data including PSA from August 2021 showed continued decline currently at 1.1.  Treatment options of moving forward were reviewed in addition to androgen deprivation.  These options include Zytiga, systemic chemotherapy versus androgen deprivation therapy  alone.  The benefit of therapy escalation using Zytiga and chemotherapy were reiterated at this time but he opted against it for the time being.  We will reserve these options unless he developed castration-resistant disease.  He is agreeable with this plan.   2.   Androgen deprivation therapy: He will receive Eligard today and repeated in 3 months.  Complications including weight gain, hot flashes and osteoporosis were reiterated.  He is agreeable to proceed.  3.  Bone directed therapy: Risks and benefits of Xgeva were reviewed today.  Complications including osteonecrosis of the jaw and hypocalcemia were discussed.  I recommended that continuing calcium and vitamin D supplement and consider Xgeva after completing dental evaluation.  He is in the process of scheduling appointment with a dentist.   4.  Follow-up: In 3 months for repeat evaluation.   30  minutes were dedicated to this visit.  The time was spent on reviewing laboratory data, disease status update, discussing treatment options and future plan of care review.   Zola Button, MD 10/6/20219:04 AM

## 2020-06-05 ENCOUNTER — Telehealth: Payer: Self-pay

## 2020-06-05 LAB — PROSTATE-SPECIFIC AG, SERUM (LABCORP): Prostate Specific Ag, Serum: 1.2 ng/mL (ref 0.0–4.0)

## 2020-06-05 NOTE — Telephone Encounter (Signed)
Contacted patient and made aware of PSA.

## 2020-06-05 NOTE — Telephone Encounter (Signed)
-----   Message from Wyatt Portela, MD sent at 06/05/2020 12:57 PM EDT ----- Please let him know his PSA is stable. Little change noted.

## 2020-06-12 DIAGNOSIS — H353211 Exudative age-related macular degeneration, right eye, with active choroidal neovascularization: Secondary | ICD-10-CM | POA: Diagnosis not present

## 2020-06-12 DIAGNOSIS — H52221 Regular astigmatism, right eye: Secondary | ICD-10-CM | POA: Diagnosis not present

## 2020-06-12 DIAGNOSIS — H2513 Age-related nuclear cataract, bilateral: Secondary | ICD-10-CM | POA: Diagnosis not present

## 2020-06-12 DIAGNOSIS — H353122 Nonexudative age-related macular degeneration, left eye, intermediate dry stage: Secondary | ICD-10-CM | POA: Diagnosis not present

## 2020-06-26 DIAGNOSIS — R059 Cough, unspecified: Secondary | ICD-10-CM | POA: Diagnosis not present

## 2020-06-26 DIAGNOSIS — R42 Dizziness and giddiness: Secondary | ICD-10-CM | POA: Diagnosis not present

## 2020-06-26 DIAGNOSIS — R110 Nausea: Secondary | ICD-10-CM | POA: Diagnosis not present

## 2020-06-26 DIAGNOSIS — R0902 Hypoxemia: Secondary | ICD-10-CM | POA: Diagnosis not present

## 2020-06-26 DIAGNOSIS — Z209 Contact with and (suspected) exposure to unspecified communicable disease: Secondary | ICD-10-CM | POA: Diagnosis not present

## 2020-06-27 ENCOUNTER — Telehealth: Payer: Self-pay

## 2020-06-27 NOTE — Telephone Encounter (Signed)
Called patient back. He explained to me that he called EMS due to weird feelings (dizzy and lightheaded when getting up after lying down, pressure in his head, feeling warm). Ems came and his vitals were all normal per patient. He slept good through the night. He wanted to check in and let me know about this episode. He also wanted to see if there was a vitamin he could take to make him have more energy. He also asked about weakness in his legs. Discussed I would need to see him for appointment and also advised he ask his prostate cancer doctor about medication he is on and scans on his back. He will make an appointment with me and f/u with his cancer.  [name], MD [facility]

## 2020-06-27 NOTE — Telephone Encounter (Signed)
Pt calling in again requesting call from Dr. Rogers Blocker or [facility].

## 2020-06-27 NOTE — Telephone Encounter (Signed)
Pt is requesting Melitta give him a call. He states he had EMS at his house yesterday and he wants to review what was told him. Pt wants to see if Dr. Rogers Blocker or [facility] recommends any other steps be done.

## 2020-06-27 NOTE — Telephone Encounter (Signed)
Pt is very adamant that he would like to be called by [facility]

## 2020-06-29 ENCOUNTER — Other Ambulatory Visit: Payer: Self-pay | Admitting: Family Medicine

## 2020-06-30 NOTE — Telephone Encounter (Signed)
Pt is requesting a brief call back again. No other info given.

## 2020-07-01 ENCOUNTER — Telehealth: Payer: Self-pay

## 2020-07-01 NOTE — Telephone Encounter (Signed)
Patient called stating that he wanted to share some information with Dr Alen Blew. Called patient to follow up. No answer. Left message for patient to call back to the office.

## 2020-07-01 NOTE — Telephone Encounter (Signed)
I spoke with pt to discuss concerns. He says he if following up on URI symptoms. He states the he has had a "bug" since last month-he denies fever. He doesn't know if he should have lab work done. He reports having no appetite. He is also waiting to hear back from his cancer doctor (Dr. Alen Blew) He complains of  generalized muscle pain, he mentions having bone cancer and not knowing if he should follow up with cancer doctor or PCP. He discussed not knowing what to watch out for? I advised him to watch out for fever, and chills. He denies sore throat, but has mild headache that comes and goes. He sounds very congested, but says he is feels better than last week. I suggested a virtual vist, but he would like to wait on cancer doctor first.

## 2020-07-02 ENCOUNTER — Telehealth: Payer: Self-pay

## 2020-07-02 NOTE — Telephone Encounter (Signed)
I would make sure he follows up with cancer doctor. Otherwise if concerned I will need to see him in office for appointment.  Thanks,  Dr. Rogers Blocker

## 2020-07-02 NOTE — Telephone Encounter (Signed)
Pt is requesting lab orders be put in to check for why he is constantly nauseas. Pt states he talked to [facility] about his concerns.

## 2020-07-02 NOTE — Telephone Encounter (Signed)
Please schedule an appointment with the patient. He needs to be seen in office before getting labs.   Thank You

## 2020-07-03 ENCOUNTER — Encounter: Payer: Self-pay | Admitting: Medical Oncology

## 2020-07-03 NOTE — Telephone Encounter (Signed)
Spoke to pt told him just calling to follow up from conversation you had with Melitta and she sent it to Dr. Rogers Blocker. Dr. Rogers Blocker said  would make sure he follows up with cancer doctor. Otherwise if concerned I will need to see him in office for appointment.  Pt verbalized understanding and said he has an appt tomorrow. Told him okay.

## 2020-07-03 NOTE — Telephone Encounter (Signed)
LVM asking patient to call back.  

## 2020-07-03 NOTE — Progress Notes (Signed)
Spoke with patient, who states he has been sick for the past 2-3 weeks. He has had a respiratory illness and experienced nausea. He is seeing his PCP tomorrow for questionable labs and visit. She recommends he follow up with Dr.Shadad. He is currently scheduled to see Dr. Lala Lund with labs and ADT. He is not sure if he will have labs drawn tomorrow. I will look for results, forward to Dr.Shadad and see if he would like him to be seen earlier. He agrees with plan and is very appreciative of my call.

## 2020-07-04 ENCOUNTER — Ambulatory Visit (INDEPENDENT_AMBULATORY_CARE_PROVIDER_SITE_OTHER): Payer: Medicare HMO | Admitting: Family Medicine

## 2020-07-04 ENCOUNTER — Other Ambulatory Visit: Payer: Self-pay

## 2020-07-04 ENCOUNTER — Encounter (HOSPITAL_COMMUNITY): Payer: Self-pay | Admitting: *Deleted

## 2020-07-04 ENCOUNTER — Emergency Department (HOSPITAL_COMMUNITY)
Admission: EM | Admit: 2020-07-04 | Discharge: 2020-07-04 | Disposition: A | Discharge status: Home / Self Care | Payer: Medicare HMO | Attending: Emergency Medicine | Admitting: Emergency Medicine

## 2020-07-04 ENCOUNTER — Encounter: Payer: Self-pay | Admitting: Family Medicine

## 2020-07-04 VITALS — BP 134/80 | HR 87 | Temp 97.7°F | Ht 70.5 in | Wt 157.2 lb

## 2020-07-04 DIAGNOSIS — R531 Weakness: Secondary | ICD-10-CM | POA: Diagnosis not present

## 2020-07-04 DIAGNOSIS — Z8546 Personal history of malignant neoplasm of prostate: Secondary | ICD-10-CM | POA: Insufficient documentation

## 2020-07-04 DIAGNOSIS — I2510 Atherosclerotic heart disease of native coronary artery without angina pectoris: Secondary | ICD-10-CM | POA: Insufficient documentation

## 2020-07-04 DIAGNOSIS — Z87891 Personal history of nicotine dependence: Secondary | ICD-10-CM | POA: Diagnosis not present

## 2020-07-04 DIAGNOSIS — E871 Hypo-osmolality and hyponatremia: Secondary | ICD-10-CM | POA: Diagnosis not present

## 2020-07-04 DIAGNOSIS — I10 Essential (primary) hypertension: Secondary | ICD-10-CM | POA: Insufficient documentation

## 2020-07-04 DIAGNOSIS — R110 Nausea: Secondary | ICD-10-CM

## 2020-07-04 DIAGNOSIS — Z79899 Other long term (current) drug therapy: Secondary | ICD-10-CM | POA: Diagnosis not present

## 2020-07-04 DIAGNOSIS — E119 Type 2 diabetes mellitus without complications: Secondary | ICD-10-CM | POA: Diagnosis not present

## 2020-07-04 DIAGNOSIS — E876 Hypokalemia: Secondary | ICD-10-CM | POA: Diagnosis not present

## 2020-07-04 DIAGNOSIS — Z7902 Long term (current) use of antithrombotics/antiplatelets: Secondary | ICD-10-CM | POA: Insufficient documentation

## 2020-07-04 LAB — CBC WITH DIFFERENTIAL/PLATELET
Abs Immature Granulocytes: 0.09 10*3/uL — ABNORMAL HIGH (ref 0.00–0.07)
Absolute Monocytes: 406 cells/uL (ref 200–950)
Basophils Absolute: 21 cells/uL (ref 0–200)
Basophils Absolute: 0 10*3/uL (ref 0.0–0.1)
Basophils Relative: 0.2 %
Basophils Relative: 0 %
Eosinophils Absolute: 10 cells/uL — ABNORMAL LOW (ref 15–500)
Eosinophils Absolute: 0 10*3/uL (ref 0.0–0.5)
Eosinophils Relative: 0.1 %
Eosinophils Relative: 0 %
HCT: 36.5 % — ABNORMAL LOW (ref 38.5–50.0)
HCT: 35.7 % — ABNORMAL LOW (ref 39.0–52.0)
Hemoglobin: 13.3 g/dL (ref 13.2–17.1)
Hemoglobin: 13.5 g/dL (ref 13.0–17.0)
Immature Granulocytes: 1 %
Lymphocytes Relative: 4 %
Lymphs Abs: 239 cells/uL — ABNORMAL LOW (ref 850–3900)
Lymphs Abs: 0.3 10*3/uL — ABNORMAL LOW (ref 0.7–4.0)
MCH: 33.7 pg — ABNORMAL HIGH (ref 27.0–33.0)
MCH: 32.6 pg (ref 26.0–34.0)
MCHC: 36.4 g/dL — ABNORMAL HIGH (ref 32.0–36.0)
MCHC: 37 g/dL — ABNORMAL HIGH (ref 30.0–36.0)
MCV: 92.4 fL (ref 80.0–100.0)
MCV: 88.1 fL (ref 80.0–100.0)
MPV: 8.8 fL (ref 7.5–12.5)
Monocytes Absolute: 0.3 10*3/uL (ref 0.1–1.0)
Monocytes Relative: 3.9 %
Monocytes Relative: 4 %
Neutro Abs: 9724 cells/uL — ABNORMAL HIGH (ref 1500–7800)
Neutro Abs: 6.9 10*3/uL (ref 1.7–7.7)
Neutrophils Relative %: 93.5 %
Neutrophils Relative %: 91 %
Platelets: 442 10*3/uL — ABNORMAL HIGH (ref 140–400)
Platelets: 421 10*3/uL — ABNORMAL HIGH (ref 150–400)
RBC: 3.95 10*6/uL — ABNORMAL LOW (ref 4.20–5.80)
RBC: 4.05 MIL/uL — ABNORMAL LOW (ref 4.22–5.81)
RDW: 13 % (ref 11.0–15.0)
RDW: 11.9 % (ref 11.5–15.5)
Total Lymphocyte: 2.3 %
WBC: 10.4 10*3/uL (ref 3.8–10.8)
WBC: 7.6 10*3/uL (ref 4.0–10.5)
nRBC: 0 % (ref 0.0–0.2)

## 2020-07-04 LAB — BASIC METABOLIC PANEL
Anion gap: 13 (ref 5–15)
BUN: 26 mg/dL — ABNORMAL HIGH (ref 8–23)
CO2: 24 mmol/L (ref 22–32)
Calcium: 9 mg/dL (ref 8.9–10.3)
Chloride: 87 mmol/L — ABNORMAL LOW (ref 98–111)
Creatinine, Ser: 1.4 mg/dL — ABNORMAL HIGH (ref 0.61–1.24)
GFR, Estimated: 51 mL/min — ABNORMAL LOW (ref >60)
Glucose, Bld: 168 mg/dL — ABNORMAL HIGH (ref 70–99)
Potassium: 2.6 mmol/L — CL (ref 3.5–5.1)
Sodium: 124 mmol/L — ABNORMAL LOW (ref 135–145)

## 2020-07-04 LAB — COMPLETE METABOLIC PANEL WITH GFR
AG Ratio: 1.5 (calc) (ref 1.0–2.5)
ALT: 36 U/L (ref 9–46)
AST: 47 U/L — ABNORMAL HIGH (ref 10–35)
Albumin: 3.8 g/dL (ref 3.6–5.1)
Alkaline phosphatase (APISO): 65 U/L (ref 35–144)
BUN/Creatinine Ratio: 17 (calc) (ref 6–22)
BUN: 25 mg/dL (ref 7–25)
CO2: 27 mmol/L (ref 20–32)
Calcium: 9.3 mg/dL (ref 8.6–10.3)
Chloride: 86 mmol/L — ABNORMAL LOW (ref 98–110)
Creat: 1.48 mg/dL — ABNORMAL HIGH (ref 0.70–1.18)
GFR, Est African American: 51 mL/min/{1.73_m2} — ABNORMAL LOW (ref >=60)
GFR, Est Non African American: 44 mL/min/{1.73_m2} — ABNORMAL LOW (ref >=60)
Globulin: 2.6 g/dL (calc) (ref 1.9–3.7)
Glucose, Bld: 178 mg/dL — ABNORMAL HIGH (ref 65–99)
Potassium: 3.4 mmol/L — ABNORMAL LOW (ref 3.5–5.3)
Sodium: 126 mmol/L — ABNORMAL LOW (ref 135–146)
Total Bilirubin: 0.9 mg/dL (ref 0.2–1.2)
Total Protein: 6.4 g/dL (ref 6.1–8.1)

## 2020-07-04 MED ORDER — SODIUM CHLORIDE 0.9 % IV BOLUS
1000.0000 mL | Freq: Once | INTRAVENOUS | Status: AC
  Administered 2020-07-04: 1000 mL via INTRAVENOUS

## 2020-07-04 MED ORDER — POTASSIUM CHLORIDE CRYS ER 20 MEQ PO TBCR
40.0000 meq | EXTENDED_RELEASE_TABLET | Freq: Once | ORAL | Status: AC
  Administered 2020-07-04: 40 meq via ORAL
  Filled 2020-07-04: qty 2

## 2020-07-04 MED ORDER — POTASSIUM CHLORIDE 10 MEQ/100ML IV SOLN
10.0000 meq | Freq: Once | INTRAVENOUS | Status: AC
  Administered 2020-07-04: 10 meq via INTRAVENOUS
  Filled 2020-07-04: qty 100

## 2020-07-04 MED ORDER — ONDANSETRON 4 MG PO TBDP: 4.0000 mg | ORAL_TABLET | Freq: Three times a day (TID) | ORAL | 0 refills | Status: DC | PRN

## 2020-07-04 NOTE — Progress Notes (Signed)
Patient: Wayne Perkins MRN: [id number] DOB: [birth year] PCP: [name], MD     Subjective:  Chief Complaint  Patient presents with  . Nausea    HPI: The patient is a 79 y.o. male who presents today for nausea and just not feeling well. States his symptoms started about 3 weeks ago. He would have nausea and couldn't eat with out gagging. He will occasionally get a headache and then it goes away. He has not thrown up, but has had some dry heaves and a dry mouth. He denies any low blood sugars. He has lost 12 pounds since August. He has no appetite and has to force himself to eat. He is also fatigued. He denies any shortness of breath, cough, leg swelling. He is drinking water and keeping liquids down. No other sick contacts at home. He sometimes gets a little lightheaded with standing.   He does have prostate cancer with mets to bone. Last body scan was in June 2021. Receiving Leuprolide injections every 3 months for the prostate cancer. Could not tolerate xtandi.   He has not received his covid booster.    Review of Systems  Constitutional: Positive for fatigue.  Respiratory: Negative for chest tightness and shortness of breath.   Gastrointestinal: Positive for nausea. Negative for abdominal pain and diarrhea.  Neurological: Positive for light-headedness and headaches. Negative for dizziness.    Allergies Patient is allergic to glipizide and statins.  Past Medical History Patient  has a past medical history of Arthritis, Blood in stool, Childhood asthma, Diabetes mellitus without complication ([place]), Heart disease, Hypertension, essential, Prostate cancer ([place]), and Stroke ([place]).  Surgical History Patient  has a past surgical history that includes Ganglion cyst excision (Right); Coronary angioplasty with stent; and Prostate biopsy.  Family History [name]'s family history includes Depression in his brother; Early death in his brother; Heart disease in his brother; Hyperlipidemia  in his mother and sister; Hypertension in his mother; Mental illness in his brother; Stroke in his father.  Social History Patient  reports that he has quit smoking. His smoking use included cigarettes. He has never used smokeless tobacco. He reports previous alcohol use. He reports that he does not use drugs.    Objective: Vitals:   07/04/20 1307  BP: 134/80  Pulse: 87  Temp: 97.7 F (36.5 C)  TempSrc: Temporal  SpO2: 95%  Weight: 157 lb 3.2 oz (71.3 kg)  Height: 5' 10.5" (1.791 m)    Body mass index is 22.24 kg/m.  Physical Exam Vitals reviewed.  Constitutional:      General: He is not in acute distress.    Comments: Thin, pale and just doesn't look like he feels good  HENT:     Head: Normocephalic and atraumatic.     Right Ear: Tympanic membrane, ear canal and external ear normal.     Left Ear: Tympanic membrane, ear canal and external ear normal.     Mouth/Throat:     Mouth: Mucous membranes are moist.  Cardiovascular:     Rate and Rhythm: Normal rate and regular rhythm.     Heart sounds: Normal heart sounds. No murmur heard.   Pulmonary:     Effort: Pulmonary effort is normal.     Breath sounds: Normal breath sounds.  Abdominal:     General: Bowel sounds are normal.     Palpations: Abdomen is soft.  Musculoskeletal:     Cervical back: Normal range of motion and neck supple.  Lymphadenopathy:     Cervical:  No cervical adenopathy.  Skin:    General: Skin is warm.     Capillary Refill: Capillary refill takes less than 2 seconds.     Findings: No bruising.     Comments: No skin tenting or dry mucous membranes. Pale color in face   Neurological:     General: No focal deficit present.     Mental Status: He is alert and oriented to person, place, and time.        Assessment/plan: 1. Nausea -large differential. Stat labs, concern for anemia/worsneing renal disease. Also concern for worsening metastatic prostate cancer. zofran prn for nausea, encouraged fluids.  Will get back to him today regarding labs and sending chart to Dr. Alen Blew. Er precautions given.  - TSH; Future - CBC with Differential/Platelet; Future - COMPLETE METABOLIC PANEL WITH GFR; Future  2. Weakness generalized Multiple concerns. Metastatic prostate cancer, anemia due to pallor on exam and just overall not eating for a few weeks. Checking labs, hx of CKD and may also have some component of AKI on CKD with decreased po intake. Will defer imaging to Dr. Alen Blew after labs come back.   Hold trulicity since he is not eating with weight loss and nausea. Blood pressure stable.    This visit occurred during the SARS-CoV-2 public health emergency.  Safety protocols were in place, including screening questions prior to the visit, additional usage of staff PPE, and extensive cleaning of exam room while observing appropriate contact time as indicated for disinfecting solutions.     Return if symptoms worsen or fail to improve.   [name], MD [place]  07/04/2020

## 2020-07-04 NOTE — ED Notes (Signed)
CRITICAL VALUE ALERT  Critical Value: K 2.6  Date & Time Notied:  07/04/20  1836  Provider Notified: zammitt Orders Received/Actions taken:

## 2020-07-04 NOTE — ED Notes (Signed)
Primary Dr said sodium was too low

## 2020-07-04 NOTE — ED Triage Notes (Signed)
Advised to come in for IV to correct low sodium

## 2020-07-04 NOTE — Patient Instructions (Signed)
-  I want you to hold trulicity for next week or two, especially since not eating well.   -checking labs, concerned for anemia with your color and also concern with hx of prostate cancer.   -i'll send to dr. Alen Blew and will be in touch  -nausea medication sent in for you to take as needed. Hopefully this will give you some relief.    i'll be in touch. If you have fever/increased weakness I want you to go to ER. I ordered your labs stat so I will have back today,   Praying for you Wayne Perkins. im so sorry you feel bad,,  Dr. Rogers Blocker

## 2020-07-04 NOTE — Discharge Instructions (Addendum)
Take twice the amount of potassium that you usually do and follow-up with your doctor Monday return if any problems

## 2020-07-04 NOTE — ED Provider Notes (Signed)
[facility] EMERGENCY DEPARTMENT Provider Note   CSN: [id number] Arrival date & time: 07/04/20  1659     History Chief Complaint  Patient presents with  . Abnormal Lab    Wayne Perkins is a 79 y.o. male.  Patient was sent to the emergency department because his sodium was low and he was mildly weak.  No vomiting no diarrhea  The history is provided by the patient. No language interpreter was used.  Weakness Severity:  Moderate Onset quality:  Sudden Timing:  Constant Progression:  Waxing and waning Chronicity:  New Context: not alcohol use   Relieved by:  Nothing Worsened by:  Nothing Associated symptoms: no abdominal pain, no chest pain, no cough, no diarrhea, no frequency, no headaches and no seizures        Past Medical History:  Diagnosis Date  . Arthritis   . Blood in stool   . Childhood asthma   . Diabetes mellitus without complication ([place])    type II  . Heart disease   . Hypertension, essential   . Prostate cancer ([place])   . Stroke [facility])     Patient Active Problem List   Diagnosis Date Noted  . Prostate cancer ([place]) 02/19/2020  . Acute ischemic stroke ([place]) 07/31/2019  . HLD (hyperlipidemia) 07/31/2019  . Malignant neoplasm of prostate metastatic to bone ([place]) 07/31/2019  . Hypertension, essential 01/24/2019  . TIA (transient ischemic attack) 01/24/2019  . CAD (coronary artery disease) 01/24/2019  . Biochemically recurrent malignant neoplasm of prostate ([place]) 01/24/2019  . Type 2 diabetes with nephropathy ([place]) 01/24/2019  . Macular degeneration 01/24/2019    Past Surgical History:  Procedure Laterality Date  . CORONARY ANGIOPLASTY WITH STENT PLACEMENT    . GANGLION CYST EXCISION Right   . PROSTATE BIOPSY         Family History  Problem Relation Age of Onset  . Hyperlipidemia Mother   . Hypertension Mother   . Stroke Father   . Hyperlipidemia Sister   . Depression Brother   . Early death Brother   . Heart disease Brother   . Mental  illness Brother   . Prostate cancer Neg Hx   . Breast cancer Neg Hx   . Colon cancer Neg Hx   . Pancreatic cancer Neg Hx     Social History   Tobacco Use  . Smoking status: Former Smoker    Types: Cigarettes  . Smokeless tobacco: Never Used  . Tobacco comment: as a teeenager  Vaping Use  . Vaping Use: Never used  Substance Use Topics  . Alcohol use: Not Currently  . Drug use: Never    Home Medications Prior to Admission medications   Medication Sig Start Date End Date Taking? Authorizing Provider  Alcohol Swabs (B-D SINGLE USE SWABS REGULAR) PADS  11/05/19   [provider]  betamethasone dipropionate 0.05 % cream Apply topically 2 (two) times daily. 10/05/19   [name], MD  Blood Glucose Calibration (ACCU-CHEK AVIVA) SOLN  11/05/19   [provider]  blood glucose meter kit and supplies Use once a day in the AM for fasting sugars. 11/02/19   [name], MD  calcium-vitamin D (OSCAL WITH D) 500-200 MG-UNIT tablet Take 1 tablet by mouth 2 (two) times daily. 04/02/20   Wyatt Portela, MD  Cholecalciferol (VITAMIN D3) 50 MCG (2000 UT) TABS Take by mouth.    [provider]  clopidogrel (PLAVIX) 75 MG tablet TAKE 1 TABLET EVERY DAY 06/30/20   Rogers Blocker,  [name], MD  Dulaglutide (TRULICITY) 5.09 TO/6.7TI SOPN Inject 0.75 mg into the skin once a week. 12/31/19   [name], MD  Ez Smart Blood Glucose Lancets MISC by Does not apply route. 10/24/19   [name], MD  ezetimibe (ZETIA) 10 MG tablet Take 1 tablet (10 mg total) by mouth daily. 01/24/19   [name], MD  glucose blood test strip Use as instructed 10/24/19   [name], MD  hydrochlorothiazide (HYDRODIURIL) 25 MG tablet TAKE 1 TABLET (25 MG TOTAL) BY MOUTH DAILY. 02/20/20   [name], MD  hydrOXYzine (ATARAX/VISTARIL) 10 MG tablet Take 1 tablet (10 mg total) by mouth 3 (three) times daily as needed for itching. 04/16/20   [name], MD  lisinopril (ZESTRIL) 2.5 MG tablet Take 1 tablet  (2.5 mg total) by mouth daily. 08/06/19   [name], MD  Multiple Vitamin (MULTI-DAY VITAMINS PO) Take by mouth.    [provider]  Multiple Vitamins-Minerals (PRESERVISION AREDS PO) Take by mouth. Soft gels    [provider]  NIFEdipine (PROCARDIA XL/NIFEDICAL-XL) 90 MG 24 hr tablet TAKE 1 TABLET EVERY DAY 06/30/20   [name], MD  ondansetron (ZOFRAN ODT) 4 MG disintegrating tablet Take 1 tablet (4 mg total) by mouth every 8 (eight) hours as needed for nausea or vomiting. 07/04/20   [name], MD  polyethylene glycol (MIRALAX / GLYCOLAX) 17 g packet Take 17 g by mouth daily.    [provider]  potassium chloride SA (KLOR-CON) 20 MEQ tablet TAKE 1 TABLET TWICE DAILY 04/07/20   [name], MD  Saw Palmetto 450 MG CAPS Take by mouth.    [provider]  vitamin B-12 (CYANOCOBALAMIN) 1000 MCG tablet Take 1,000 mcg by mouth daily.    [provider]    Allergies    Glipizide and Statins  Review of Systems   Review of Systems  Constitutional: Negative for appetite change and fatigue.  HENT: Negative for congestion, ear discharge and sinus pressure.   Eyes: Negative for discharge.  Respiratory: Negative for cough.   Cardiovascular: Negative for chest pain.  Gastrointestinal: Negative for abdominal pain and diarrhea.  Genitourinary: Negative for frequency and hematuria.  Musculoskeletal: Negative for back pain.  Skin: Negative for rash.  Neurological: Positive for weakness. Negative for seizures and headaches.  Psychiatric/Behavioral: Negative for hallucinations.    Physical Exam Updated Vital Signs BP 122/81 (BP Location: Right Arm)   Pulse 87   Temp 98.2 F (36.8 C) (Oral)   Resp 17   Ht '5\' 10"'  (1.778 m)   SpO2 97%   BMI 22.56 kg/m   Physical Exam Vitals and nursing note reviewed.  Constitutional:      Appearance: He is well-developed.  HENT:     Head: Normocephalic.     Nose: Nose normal.  Eyes:     General:  No scleral icterus.    Conjunctiva/sclera: Conjunctivae normal.  Neck:     Thyroid: No thyromegaly.  Cardiovascular:     Rate and Rhythm: Normal rate and regular rhythm.     Heart sounds: No murmur heard.  No friction rub. No gallop.   Pulmonary:     Breath sounds: No stridor. No wheezing or rales.  Chest:     Chest wall: No tenderness.  Abdominal:     General: There is no distension.     Tenderness: There is no abdominal tenderness. There is no rebound.  Musculoskeletal:        General: Normal range of  motion.     Cervical back: Neck supple.  Lymphadenopathy:     Cervical: No cervical adenopathy.  Skin:    Findings: No erythema or rash.  Neurological:     Mental Status: He is alert and oriented to person, place, and time.     Motor: No abnormal muscle tone.     Coordination: Coordination normal.  Psychiatric:        Behavior: Behavior normal.     ED Results / Procedures / Treatments   Labs (all labs ordered are listed, but only abnormal results are displayed) Labs Reviewed  CBC WITH DIFFERENTIAL/PLATELET - Abnormal; Notable for the following components:      Result Value   RBC 4.05 (*)    HCT 35.7 (*)    MCHC 37.0 (*)    Platelets 421 (*)    Lymphs Abs 0.3 (*)    Abs Immature Granulocytes 0.09 (*)    All other components within normal limits  BASIC METABOLIC PANEL - Abnormal; Notable for the following components:   Sodium 124 (*)    Potassium 2.6 (*)    Chloride 87 (*)    Glucose, Bld 168 (*)    BUN 26 (*)    Creatinine, Ser 1.40 (*)    GFR, Estimated 51 (*)    All other components within normal limits    EKG None  Radiology No results found.  Procedures Procedures (including critical care time)  Medications Ordered in ED Medications  sodium chloride 0.9 % bolus 1,000 mL (0 mLs Intravenous Stopped 07/04/20 1924)  potassium chloride 10 mEq in 100 mL IVPB (0 mEq Intravenous Stopped 07/04/20 2104)  potassium chloride 10 mEq in 100 mL IVPB (0 mEq  Intravenous Stopped 07/04/20 2047)  potassium chloride SA (KLOR-CON) CR tablet 40 mEq (40 mEq Oral Given 07/04/20 1926)    ED Course  I have reviewed the triage vital signs and the nursing notes.  Pertinent labs & imaging results that were available during my care of the patient were reviewed by me and considered in my medical decision making (see chart for details).    MDM Rules/Calculators/A&P                          Patient with hyponatremia and hypokalemia.  He was given potassium replacement and a liter of normal saline.  I spoke with the patient and felt like he should be admitted overnight with continued replacement of his sodium and potassium but he decided to leave AMA instead.  I told him to increase his potassium intake to twice what he normally takes and see his doctor Monday      This patient presents to the ED for concern of weakness, this involves an extensive number of treatment options, and is a complaint that carries with it a high risk of complications and morbidity.  The differential diagnosis includes hyponatremia   Lab Tests:   I Ordered, reviewed, and interpreted labs, which included CBC and chemistries which showed hyponatremia and hypokalemia  Medicines ordered:   I ordered medication normal saline for dehydration and potassium for hypokalemia  Imaging Studies ordered:     Additional history obtained:   Additional history obtained from significant other  Previous records obtained and reviewed.  Consultations Obtained:     Reevaluation:  After the interventions stated above, I reevaluated the patient and found moderately improved  Critical Interventions:  .   Final Clinical Impression(s) / ED Diagnoses Final diagnoses:  None    Rx / DC Orders ED Discharge Orders    None       Milton Ferguson, MD 07/06/20 [phone]

## 2020-07-04 NOTE — ED Notes (Signed)
PT educated with DC instructions and verbalized complete understanding and denies questions at this time. Pt IV removed fully intact and dressing applied. Pt self ambulated to exit with steady gait.

## 2020-07-05 LAB — TSH: TSH: 0.47 mIU/L (ref 0.40–4.50)

## 2020-07-07 ENCOUNTER — Telehealth: Payer: Self-pay

## 2020-07-07 NOTE — Telephone Encounter (Signed)
There was no calls made on behalf of Dr. Rogers Blocker. I spoke with the pt to answer any questions or concerns. Pt says that he called Dr. Rogers Blocker to make her aware of ED visit.

## 2020-07-07 NOTE — Telephone Encounter (Signed)
Pt states he is returning Dr. Shelby Mattocks call and request a call back

## 2020-07-09 NOTE — Telephone Encounter (Signed)
He needs to be put on schedule for Friday since he didn't want to be admitted. I read hospital note and they recommended he get admitted and he went home. We need to recheck labs and make sure sodium has improved.  [name], MD [place]

## 2020-07-09 NOTE — Telephone Encounter (Signed)
I tried calling the pt, but for some reason his phone is saying " you have reached a number that is no longer in service."

## 2020-07-09 NOTE — Telephone Encounter (Signed)
Make sure are code is [phone]- I forget myself, when calling him.

## 2020-07-09 NOTE — Telephone Encounter (Signed)
Please schedule pt for follow ED visit.    Thank You

## 2020-07-09 NOTE — Telephone Encounter (Signed)
That was it.. Pt scheduled for friday

## 2020-07-10 NOTE — Telephone Encounter (Signed)
Noted  

## 2020-07-11 ENCOUNTER — Other Ambulatory Visit: Payer: Self-pay

## 2020-07-11 ENCOUNTER — Encounter: Payer: Self-pay | Admitting: Family Medicine

## 2020-07-11 ENCOUNTER — Ambulatory Visit (INDEPENDENT_AMBULATORY_CARE_PROVIDER_SITE_OTHER): Payer: Medicare HMO | Admitting: Family Medicine

## 2020-07-11 VITALS — BP 126/72 | HR 80 | Temp 98.7°F | Ht 70.0 in | Wt 160.8 lb

## 2020-07-11 DIAGNOSIS — E871 Hypo-osmolality and hyponatremia: Secondary | ICD-10-CM

## 2020-07-11 DIAGNOSIS — E876 Hypokalemia: Secondary | ICD-10-CM

## 2020-07-11 DIAGNOSIS — Z20822 Contact with and (suspected) exposure to covid-19: Secondary | ICD-10-CM

## 2020-07-11 NOTE — Progress Notes (Signed)
Patient: Wayne Perkins MRN: [id number] DOB: [birth year] PCP: [name], MD     Subjective:  Chief Complaint  Patient presents with  . Follow-up    ED  . hyponatremia  . hypokalemia    HPI: The patient is a 79 y.o. male who presents today for ED follow up. He says that he feels a lot better. He is getting his strength back, and he is eating better.   I saw him last week and I sent him to hospital due to symptomatic hyponatremia. Per ER note they recommended he be admitted, but left AMA. He tells me he never understood this to be what they were saying and that he was told he could go home. He was told to increase his potassium. He also tells me his daughter is in hospital with covid. Sodium was 126 here in clinic and 124 at hospital.   He was supposed to increase his potassium and he has not done this. Taking his 52meq BID. Potassium was 2.6 in ER.   Since I saw him last week he feels like a new man. He feels like nothing is wrong with him.   Review of Systems  Constitutional: Negative for chills, fatigue and fever.  HENT: Negative for dental problem, ear pain, hearing loss and trouble swallowing.   Eyes: Negative for visual disturbance.  Respiratory: Negative for cough, chest tightness and shortness of breath.   Cardiovascular: Negative for chest pain, palpitations and leg swelling.  Gastrointestinal: Negative for abdominal pain, blood in stool, diarrhea, nausea and vomiting.  Endocrine: Negative for cold intolerance, polydipsia, polyphagia and polyuria.  Genitourinary: Negative for dysuria and hematuria.  Musculoskeletal: Negative for arthralgias.  Skin: Negative for rash.  Neurological: Negative for dizziness and headaches.  Psychiatric/Behavioral: Negative for dysphoric mood and sleep disturbance. The patient is not nervous/anxious.     Allergies Patient is allergic to glipizide and statins.  Past Medical History Patient  has a past medical history of Arthritis, Blood  in stool, Childhood asthma, Diabetes mellitus without complication ([place]), Heart disease, Hypertension, essential, Prostate cancer ([place]), and Stroke ([place]).  Surgical History Patient  has a past surgical history that includes Ganglion cyst excision (Right); Coronary angioplasty with stent; and Prostate biopsy.  Family History [name]'s family history includes Depression in his brother; Early death in his brother; Heart disease in his brother; Hyperlipidemia in his mother and sister; Hypertension in his mother; Mental illness in his brother; Stroke in his father.  Social History Patient  reports that he has quit smoking. His smoking use included cigarettes. He has never used smokeless tobacco. He reports previous alcohol use. He reports that he does not use drugs.    Objective: Vitals:   07/11/20 1028  BP: 126/72  Pulse: 80  Temp: 98.7 F (37.1 C)  TempSrc: Temporal  SpO2: 97%  Weight: 160 lb 12.8 oz (72.9 kg)  Height: 5\' 10"  (1.778 m)    Body mass index is 23.07 kg/m.  Physical Exam Vitals reviewed.  Constitutional:      Appearance: Normal appearance.  HENT:     Head: Normocephalic and atraumatic.     Mouth/Throat:     Mouth: Mucous membranes are moist.  Cardiovascular:     Rate and Rhythm: Normal rate and regular rhythm.     Heart sounds: Normal heart sounds.  Pulmonary:     Effort: Pulmonary effort is normal.     Breath sounds: Normal breath sounds.  Abdominal:     General: Abdomen is flat. Bowel  sounds are normal.     Palpations: Abdomen is soft.  Skin:    General: Skin is warm.     Capillary Refill: Capillary refill takes less than 2 seconds.  Neurological:     General: No focal deficit present.     Mental Status: He is alert and oriented to person, place, and time.  Psychiatric:        Mood and Affect: Mood normal.        Behavior: Behavior normal.        Assessment/plan: 1. Hyponatremia Received 1L in the ER. Refused admittance. Eating and drinking so  much better and feels better. Repeat sodium today.  - COMPLETE METABOLIC PANEL WITH GFR; Future  2. Hypokalemia repleted IV and orally in ER. Never increased home dosage. Continued on his 28meq BID. Repeat today.   3. Contact with and (suspected) exposure to covid-19 Contact with his daughter and ? If his fatigue and symptoms were due to covid early on or hyponatremia.  - SARS CoV2 Serology(COVID19) AB(IgG,IgM),Immunoassay  Reviewed ER note and labs.   This visit occurred during the SARS-CoV-2 public health emergency.  Safety protocols were in place, including screening questions prior to the visit, additional usage of staff PPE, and extensive cleaning of exam room while observing appropriate contact time as indicated for disinfecting solutions.    Return if symptoms worsen or fail to improve.   [name], MD [place]   07/11/2020

## 2020-07-14 ENCOUNTER — Other Ambulatory Visit: Payer: Self-pay

## 2020-07-14 ENCOUNTER — Emergency Department (HOSPITAL_COMMUNITY): Payer: Medicare HMO

## 2020-07-14 ENCOUNTER — Emergency Department (HOSPITAL_COMMUNITY)
Admission: EM | Admit: 2020-07-14 | Discharge: 2020-07-14 | Disposition: A | Discharge status: Home / Self Care | Payer: Medicare HMO | Attending: Emergency Medicine | Admitting: Emergency Medicine

## 2020-07-14 ENCOUNTER — Encounter (HOSPITAL_COMMUNITY): Payer: Self-pay

## 2020-07-14 DIAGNOSIS — Z87891 Personal history of nicotine dependence: Secondary | ICD-10-CM | POA: Diagnosis not present

## 2020-07-14 DIAGNOSIS — Z8546 Personal history of malignant neoplasm of prostate: Secondary | ICD-10-CM | POA: Diagnosis not present

## 2020-07-14 DIAGNOSIS — R339 Retention of urine, unspecified: Secondary | ICD-10-CM

## 2020-07-14 DIAGNOSIS — Z79899 Other long term (current) drug therapy: Secondary | ICD-10-CM | POA: Diagnosis not present

## 2020-07-14 DIAGNOSIS — K5900 Constipation, unspecified: Secondary | ICD-10-CM

## 2020-07-14 DIAGNOSIS — R918 Other nonspecific abnormal finding of lung field: Secondary | ICD-10-CM

## 2020-07-14 DIAGNOSIS — E119 Type 2 diabetes mellitus without complications: Secondary | ICD-10-CM | POA: Insufficient documentation

## 2020-07-14 DIAGNOSIS — R911 Solitary pulmonary nodule: Secondary | ICD-10-CM | POA: Insufficient documentation

## 2020-07-14 DIAGNOSIS — K6389 Other specified diseases of intestine: Secondary | ICD-10-CM | POA: Diagnosis not present

## 2020-07-14 DIAGNOSIS — Z7902 Long term (current) use of antithrombotics/antiplatelets: Secondary | ICD-10-CM | POA: Diagnosis not present

## 2020-07-14 DIAGNOSIS — R5381 Other malaise: Secondary | ICD-10-CM | POA: Diagnosis not present

## 2020-07-14 DIAGNOSIS — I10 Essential (primary) hypertension: Secondary | ICD-10-CM | POA: Insufficient documentation

## 2020-07-14 DIAGNOSIS — I2510 Atherosclerotic heart disease of native coronary artery without angina pectoris: Secondary | ICD-10-CM | POA: Diagnosis not present

## 2020-07-14 DIAGNOSIS — R52 Pain, unspecified: Secondary | ICD-10-CM | POA: Diagnosis not present

## 2020-07-14 DIAGNOSIS — J984 Other disorders of lung: Secondary | ICD-10-CM | POA: Diagnosis not present

## 2020-07-14 LAB — URINALYSIS, ROUTINE W REFLEX MICROSCOPIC
Bilirubin Urine: NEGATIVE
Glucose, UA: NEGATIVE mg/dL
Hgb urine dipstick: NEGATIVE
Ketones, ur: NEGATIVE mg/dL
Leukocytes,Ua: NEGATIVE
Nitrite: NEGATIVE
Protein, ur: NEGATIVE mg/dL
Specific Gravity, Urine: 1.005 (ref 1.005–1.030)
pH: 7 (ref 5.0–8.0)

## 2020-07-14 MED ORDER — LACTULOSE 10 GM/15ML PO SOLN: 10.0000 g | Freq: Two times a day (BID) | ORAL | 0 refills | Status: DC | PRN

## 2020-07-14 NOTE — ED Triage Notes (Signed)
Pt arrives from home via [facility] c/o constipation X 5 days and urinary retention since midnight tonight. Pt now reports discomfort in groin otherwise no other complaints. Pt ambulatory, alert and oriented.

## 2020-07-14 NOTE — ED Notes (Signed)
>  62ml  Residual Urine Measured on Bladder Scan. EDP Notified.

## 2020-07-14 NOTE — ED Provider Notes (Signed)
[facility] EMERGENCY DEPARTMENT Provider Note   CSN: [id number] Arrival date & time: 07/14/20  4098     History Chief Complaint  Patient presents with  . Urinary Retention    Wayne Perkins is a 79 y.o. male.  Patient with previous h/o prostate ca presents with complaints of inability to urinate since midnight. Experiencing lower abdomen/groin discomfort. No fever, nausea, vomiting. Has felt constipated for 5 days.        Past Medical History:  Diagnosis Date  . Arthritis   . Blood in stool   . Childhood asthma   . Diabetes mellitus without complication ([place])    type II  . Heart disease   . Hypertension, essential   . Prostate cancer ([place])   . Stroke [facility])     Patient Active Problem List   Diagnosis Date Noted  . Prostate cancer ([place]) 02/19/2020  . Acute ischemic stroke ([place]) 07/31/2019  . HLD (hyperlipidemia) 07/31/2019  . Malignant neoplasm of prostate metastatic to bone ([place]) 07/31/2019  . Hypertension, essential 01/24/2019  . TIA (transient ischemic attack) 01/24/2019  . CAD (coronary artery disease) 01/24/2019  . Biochemically recurrent malignant neoplasm of prostate ([place]) 01/24/2019  . Type 2 diabetes with nephropathy ([place]) 01/24/2019  . Macular degeneration 01/24/2019    Past Surgical History:  Procedure Laterality Date  . CORONARY ANGIOPLASTY WITH STENT PLACEMENT    . GANGLION CYST EXCISION Right   . PROSTATE BIOPSY         Family History  Problem Relation Age of Onset  . Hyperlipidemia Mother   . Hypertension Mother   . Stroke Father   . Hyperlipidemia Sister   . Depression Brother   . Early death Brother   . Heart disease Brother   . Mental illness Brother   . Prostate cancer Neg Hx   . Breast cancer Neg Hx   . Colon cancer Neg Hx   . Pancreatic cancer Neg Hx     Social History   Tobacco Use  . Smoking status: Former Smoker    Types: Cigarettes  . Smokeless tobacco: Never Used  . Tobacco comment: as a teeenager  Vaping Use  .  Vaping Use: Never used  Substance Use Topics  . Alcohol use: Not Currently  . Drug use: Never    Home Medications Prior to Admission medications   Medication Sig Start Date End Date Taking? Authorizing Provider  Alcohol Swabs (B-D SINGLE USE SWABS REGULAR) PADS  11/05/19   [provider]  betamethasone dipropionate 0.05 % cream Apply topically 2 (two) times daily. 10/05/19   [name], MD  Blood Glucose Calibration (ACCU-CHEK AVIVA) SOLN  11/05/19   [provider]  blood glucose meter kit and supplies Use once a day in the AM for fasting sugars. 11/02/19   [name], MD  calcium-vitamin D (OSCAL WITH D) 500-200 MG-UNIT tablet Take 1 tablet by mouth 2 (two) times daily. 04/02/20   Wyatt Portela, MD  Cholecalciferol (VITAMIN D3) 50 MCG (2000 UT) TABS Take by mouth.    [provider]  clopidogrel (PLAVIX) 75 MG tablet TAKE 1 TABLET EVERY DAY 06/30/20   [name], MD  Dulaglutide (TRULICITY) 1.19 JY/7.8GN SOPN Inject 0.75 mg into the skin once a week. 12/31/19   [name], MD  Ez Smart Blood Glucose Lancets MISC by Does not apply route. 10/24/19   [name], MD  ezetimibe (ZETIA) 10 MG tablet Take 1 tablet (10 mg total) by mouth daily. 01/24/19  [name], MD  glucose blood test strip Use as instructed 10/24/19   [name], MD  hydrochlorothiazide (HYDRODIURIL) 25 MG tablet TAKE 1 TABLET (25 MG TOTAL) BY MOUTH DAILY. 02/20/20   [name], MD  hydrOXYzine (ATARAX/VISTARIL) 10 MG tablet Take 1 tablet (10 mg total) by mouth 3 (three) times daily as needed for itching. 04/16/20   [name], MD  lisinopril (ZESTRIL) 2.5 MG tablet Take 1 tablet (2.5 mg total) by mouth daily. 08/06/19   [name], MD  Multiple Vitamin (MULTI-DAY VITAMINS PO) Take by mouth.    [provider]  Multiple Vitamins-Minerals (PRESERVISION AREDS PO) Take by mouth. Soft gels    [provider]  NIFEdipine (PROCARDIA XL/NIFEDICAL-XL) 90 MG 24 hr  tablet TAKE 1 TABLET EVERY DAY 06/30/20   [name], MD  ondansetron (ZOFRAN ODT) 4 MG disintegrating tablet Take 1 tablet (4 mg total) by mouth every 8 (eight) hours as needed for nausea or vomiting. 07/04/20   [name], MD  polyethylene glycol (MIRALAX / GLYCOLAX) 17 g packet Take 17 g by mouth daily.    [provider]  potassium chloride SA (KLOR-CON) 20 MEQ tablet TAKE 1 TABLET TWICE DAILY 04/07/20   [name], MD  Saw Palmetto 450 MG CAPS Take by mouth.    [provider]  vitamin B-12 (CYANOCOBALAMIN) 1000 MCG tablet Take 1,000 mcg by mouth daily.    [provider]    Allergies    Glipizide and Statins  Review of Systems   Review of Systems  Gastrointestinal: Positive for constipation.  Genitourinary: Positive for decreased urine volume.  All other systems reviewed and are negative.   Physical Exam Updated Vital Signs BP (!) 145/85 (BP Location: Left Arm)   Pulse 97   Temp 97.7 F (36.5 C) (Oral)   Resp 16   Ht $R'5\' 10"'cy$  (1.778 m)   Wt 76.2 kg   SpO2 97%   BMI 24.11 kg/m   Physical Exam Vitals and nursing note reviewed.  Constitutional:      General: He is not in acute distress.    Appearance: Normal appearance. He is well-developed.  HENT:     Head: Normocephalic and atraumatic.     Right Ear: Hearing normal.     Left Ear: Hearing normal.     Nose: Nose normal.  Eyes:     Conjunctiva/sclera: Conjunctivae normal.     Pupils: Pupils are equal, round, and reactive to light.  Cardiovascular:     Rate and Rhythm: Regular rhythm.     Heart sounds: S1 normal and S2 normal. No murmur heard.  No friction rub. No gallop.   Pulmonary:     Effort: Pulmonary effort is normal. No respiratory distress.     Breath sounds: Normal breath sounds.  Chest:     Chest wall: No tenderness.  Abdominal:     General: Bowel sounds are normal.     Palpations: Abdomen is soft.     Tenderness: There is abdominal tenderness in the suprapubic area.  There is no guarding or rebound. Negative signs include Murphy's sign and McBurney's sign.     Hernia: No hernia is present.  Musculoskeletal:        General: Normal range of motion.     Cervical back: Normal range of motion and neck supple.  Skin:    General: Skin is warm and dry.     Findings: No rash.  Neurological:     Mental Status: He is alert  and oriented to person, place, and time.     GCS: GCS eye subscore is 4. GCS verbal subscore is 5. GCS motor subscore is 6.     Cranial Nerves: No cranial nerve deficit.     Sensory: No sensory deficit.     Coordination: Coordination normal.  Psychiatric:        Speech: Speech normal.        Behavior: Behavior normal.        Thought Content: Thought content normal.     ED Results / Procedures / Treatments   Labs (all labs ordered are listed, but only abnormal results are displayed) Labs Reviewed  URINALYSIS, ROUTINE W REFLEX MICROSCOPIC - Abnormal; Notable for the following components:      Result Value   Color, Urine STRAW (*)    All other components within normal limits    EKG None  Radiology DG ABD ACUTE 2+V W 1V CHEST  Result Date: 07/14/2020 CLINICAL DATA:  Constipation EXAM: DG ABDOMEN ACUTE WITH 1 VIEW CHEST COMPARISON:  01/29/2020 abdominal CT.  Chest x-ray 07/31/2019 FINDINGS: Reticular and nodular densities in the bilateral lungs. No edema, effusion, or pneumothorax. Normal heart size. Aortic tortuosity. Formed stool throughout much of the colon. There are a few distended loops of small bowel without certain obstructive pattern. Left lower pole calculus measuring 13 mm. No visible pneumoperitoneum. Sclerotic bony lesions by recent CT. IMPRESSION: Multiple pulmonary nodules, recommend chest CT in this patient with history of prostate cancer. There are a few distended loops of small bowel but no generalized obstructive pattern. Diffuse colonic stool. Electronically Signed   By: Monte Fantasia M.D.   On: 07/14/2020 06:15     Procedures Procedures (including critical care time)  Medications Ordered in ED Medications - No data to display  ED Course  I have reviewed the triage vital signs and the nursing notes.  Pertinent labs & imaging results that were available during my care of the patient were reviewed by me and considered in my medical decision making (see chart for details).    MDM Rules/Calculators/A&P                          Patient presents to emergency department with urinary retention.  Patient acutely had onset of inability to urinate around midnight and has had discomfort throughout the night.  Bladder scan showed significant urinary retention.  A Foley catheter was placed and he did have resolution of his discomfort.  Patient will be discharged with catheter in place, follow-up with his urologist, Dr. Alinda Money for Foley catheter removal.  Initial urinalysis was unremarkable.  Patient also complains of constipation.  X-ray was performed.  He does have evidence of large colonic stool burden.  Will prescribe lactulose as needed.  X-ray does show evidence of pulmonary nodules.  These findings were discussed with the patient, he is to follow-up with his oncologist to determine if he has a need for additional imaging, as he does have a history of prostate cancer.  Final Clinical Impression(s) / ED Diagnoses Final diagnoses:  Pulmonary nodules  Urinary retention    Rx / DC Orders ED Discharge Orders    None       Orpah Greek, MD 07/14/20 [phone]

## 2020-07-14 NOTE — ED Notes (Signed)
Pt foley changed to a leg bag. Given discharge instructions to patient and verbal understanding. Pt called ride.

## 2020-07-14 NOTE — ED Notes (Signed)
Post Foley Insertion, Residual Urine Measurement <66ml. MD Notified.

## 2020-07-18 ENCOUNTER — Telehealth: Payer: Self-pay

## 2020-07-18 NOTE — Telephone Encounter (Signed)
I called Wayne Perkins to remind him that he needed to get labs drawn, per his appointment 07/11/2020. He says that he did get labs drawn, he was walked to the lab by Dr. Rogers Blocker. I reminded him that he had an appointment on the 07/04/2020, and he may be thinking about that appointment. He was still adamit that he had labs drawn at his last appointment. He says that he may come on Wednesday.

## 2020-07-21 ENCOUNTER — Telehealth: Payer: Self-pay

## 2020-07-21 NOTE — Telephone Encounter (Signed)
I spoke with pt to let him know that labs will be done the day of appointment, to refrain from any new labs that may be added in at appointment. Labs results will be given 24-48 hours afterwards. Pt voiced understanding.

## 2020-07-21 NOTE — Telephone Encounter (Signed)
Is requesting labs to be entered before 12/17 appt.

## 2020-07-22 DIAGNOSIS — Z20822 Contact with and (suspected) exposure to covid-19: Secondary | ICD-10-CM | POA: Diagnosis not present

## 2020-07-22 DIAGNOSIS — R338 Other retention of urine: Secondary | ICD-10-CM | POA: Diagnosis not present

## 2020-08-15 ENCOUNTER — Ambulatory Visit (INDEPENDENT_AMBULATORY_CARE_PROVIDER_SITE_OTHER): Payer: Medicare HMO | Admitting: Family Medicine

## 2020-08-15 ENCOUNTER — Encounter: Payer: Self-pay | Admitting: Family Medicine

## 2020-08-15 ENCOUNTER — Other Ambulatory Visit: Payer: Self-pay

## 2020-08-15 VITALS — BP 142/84 | HR 82 | Temp 97.2°F | Ht 70.0 in | Wt 169.6 lb

## 2020-08-15 DIAGNOSIS — E871 Hypo-osmolality and hyponatremia: Secondary | ICD-10-CM

## 2020-08-15 DIAGNOSIS — I2510 Atherosclerotic heart disease of native coronary artery without angina pectoris: Secondary | ICD-10-CM

## 2020-08-15 DIAGNOSIS — E1121 Type 2 diabetes mellitus with diabetic nephropathy: Secondary | ICD-10-CM | POA: Diagnosis not present

## 2020-08-15 DIAGNOSIS — I10 Essential (primary) hypertension: Secondary | ICD-10-CM | POA: Diagnosis not present

## 2020-08-15 DIAGNOSIS — E782 Mixed hyperlipidemia: Secondary | ICD-10-CM

## 2020-08-15 DIAGNOSIS — N183 Chronic kidney disease, stage 3 unspecified: Secondary | ICD-10-CM | POA: Diagnosis not present

## 2020-08-15 MED ORDER — HYDROXYZINE HCL 10 MG PO TABS: 10.0000 mg | ORAL_TABLET | Freq: Three times a day (TID) | ORAL | 1 refills | Status: DC | PRN

## 2020-08-15 MED ORDER — LACTULOSE 10 GM/15ML PO SOLN: 10.0000 g | Freq: Two times a day (BID) | ORAL | 0 refills | Status: DC | PRN

## 2020-08-15 NOTE — Progress Notes (Signed)
Patient: Wayne Perkins MRN: [id number] DOB: 07-May-1941 PCP: [name], MD     Subjective:  Chief Complaint  Patient presents with  . Diabetes  . Hyperlipidemia  . Hypertension  . Chronic Kidney Disease  . Constipation    HPI: The patient is a 79 y.o. male who presents today for DM, Hyperlipidemia, HTN. He was seen in ER for urinary retention and cath was placed x 1 week. He followed up with urology and this was removed   Hypertension: Here for follow up of hypertension.  Currently on procardia 90mg  daily, hctz 25mg  daily and lisinopril 2.5mg  . Home readings range from 086-578 IONGEXBM/84-13 diastolic. Takes medication as prescribed and denies any side effects. Exercise includes active and busy at home, walking. Weight has been stable. Denies any chest pain, headaches, shortness of breath, vision changes, swelling in lower extremities.   Hyperlipidemia Can not tolerate statins. I started him on zetia to get his LDL closer to goal, but he stopped this when he was itching and never started it back.   Type 2 diabetes Just saw him for this. Currently on trulicity .75mg /weekly and last a1c was 6.4.  -fasting sugars are 110-120   CAD x2 stents. In 2004 and 2010. On plavix due to stroke. No on statin due to allergy. Not on beta blocker as he tells me they didn't agree with him.   CKD Normal creatinine in 05/2019. Has trended upward ever since he had a stroke in 08/01/2019.   Having worsening constipation. Takes miralax daily, but was given px of lactulose and wants a refill. Has gotten worse since urinary retention. Drinks a lot of water. Discussed high fiber diet and moving as well.   Review of Systems  Constitutional: Negative for chills, fatigue and fever.  HENT: Negative for dental problem, ear pain, hearing loss and trouble swallowing.   Eyes: Negative for visual disturbance.  Respiratory: Negative for cough, chest tightness and shortness of breath.   Cardiovascular:  Negative for chest pain, palpitations and leg swelling.  Gastrointestinal: Positive for constipation. Negative for abdominal pain, blood in stool, diarrhea and nausea.  Endocrine: Negative for cold intolerance, polydipsia, polyphagia and polyuria.  Genitourinary: Negative for dysuria and hematuria.  Musculoskeletal: Negative for arthralgias.  Skin: Negative for rash.  Neurological: Negative for dizziness and headaches.  Psychiatric/Behavioral: Negative for dysphoric mood and sleep disturbance. The patient is not nervous/anxious.     Allergies Patient is allergic to glipizide and statins.  Past Medical History Patient  has a past medical history of Arthritis, Blood in stool, Childhood asthma, Diabetes mellitus without complication ([place]), Heart disease, Hypertension, essential, Prostate cancer ([place]), and Stroke ([place]).  Surgical History Patient  has a past surgical history that includes Ganglion cyst excision (Right); Coronary angioplasty with stent; and Prostate biopsy.  Family History [name]'s family history includes Depression in his brother; Early death in his brother; Heart disease in his brother; Hyperlipidemia in his mother and sister; Hypertension in his mother; Mental illness in his brother; Stroke in his father.  Social History Patient  reports that he has quit smoking. His smoking use included cigarettes. He has never used smokeless tobacco. He reports previous alcohol use. He reports that he does not use drugs.    Objective: Vitals:   08/15/20 1006  BP: (!) 142/84  Pulse: 82  Temp: (!) 97.2 F (36.2 C)  TempSrc: Temporal  SpO2: 99%  Weight: 169 lb 9.6 oz (76.9 kg)  Height: 5\' 10"  (1.778 m)    Body mass  index is 24.34 kg/m.  Physical Exam Vitals reviewed.  Constitutional:      Appearance: Normal appearance. He is well-developed and well-nourished.     Comments: Slightly kyphotic   HENT:     Head: Normocephalic and atraumatic.     Comments: Hard of hearing      Right Ear: Tympanic membrane, ear canal and external ear normal.     Left Ear: Tympanic membrane, ear canal and external ear normal.     Nose: Nose normal.     Mouth/Throat:     Mouth: Oropharynx is clear and moist. Mucous membranes are moist.  Eyes:     Extraocular Movements: EOM normal.     Conjunctiva/sclera: Conjunctivae normal.     Pupils: Pupils are equal, round, and reactive to light.  Neck:     Thyroid: No thyromegaly.     Vascular: No carotid bruit.  Cardiovascular:     Rate and Rhythm: Normal rate and regular rhythm.     Pulses: Normal pulses and intact distal pulses.     Heart sounds: Normal heart sounds. No murmur heard.   Pulmonary:     Effort: Pulmonary effort is normal.     Breath sounds: Normal breath sounds.  Abdominal:     General: Abdomen is flat. Bowel sounds are normal. There is no distension.     Palpations: Abdomen is soft.     Tenderness: There is no abdominal tenderness.  Musculoskeletal:     Cervical back: Normal range of motion and neck supple.  Lymphadenopathy:     Cervical: No cervical adenopathy.  Skin:    General: Skin is warm and dry.     Capillary Refill: Capillary refill takes less than 2 seconds.     Findings: No rash.  Neurological:     General: No focal deficit present.     Mental Status: He is alert and oriented to person, place, and time.     Cranial Nerves: No cranial nerve deficit.     Coordination: Coordination normal.     Deep Tendon Reflexes: Reflexes normal.  Psychiatric:        Mood and Affect: Mood and affect and mood normal.        Behavior: Behavior normal.    [facility] Office Visit from 07/11/2020 in [place]  PHQ-2 Total Score 0          Assessment/plan: 1. Hypertension, essential Slightly above goal today. Continue his hctz 25mg /day as long as no hyponatremia, lisinopril 2.5mg , nifedipine 90mg . Home readings to goal. Routine labs today. F/u in  6 months.  - CBC with  Differential/Platelet; Future - COMPLETE METABOLIC PANEL WITH GFR; Future - COMPLETE METABOLIC PANEL WITH GFR - CBC with Differential/Platelet  2. Coronary artery disease involving native coronary artery of native heart without angina pectoris Can not tolerate statins. Aim for BP and sugar control. Plavix.   3. Type 2 diabetes with nephropathy ([place]) -well controlled  -on ACE-I. (2.5mg )  -continue trulicity and diabetic diet -has had covid vaccine, due for upcoming second injection -declines flu and pneumonia shot -foot exam next visit.  -utd on eye exam.  -f/u in 3-6 months.  - Hemoglobin A1c; Future - Hemoglobin A1c  4. Mixed hyperlipidemia Can not tolerate statin/zetia. May be good candidate for repatha, but unsure if he would be on board for this with everything going on. Will see how his lipid panel is today.  - Lipid panel; Future - Lipid panel  5. Stage 3 chronic  kidney disease, unspecified whether stage 3a or 3b CKD ([place]) Happened after CVA in 07/2019. Has been trending downward. Avoid NSAIDs. ? If due to diabetes as he was uncontrolled until recently. Sugars now well controlled. No nephrotoxic drugs. F/u today on labs today.   6. Hx of hyponatremia New issue on last visit with trip to ER. If sodium continues to be low will need to likely stop his hctz. F/u on labs. Asymptomatic.   -f/u with oncologist regarding pulmonary nodules seen on xray.     This visit occurred during the SARS-CoV-2 public health emergency.  Safety protocols were in place, including screening questions prior to the visit, additional usage of staff PPE, and extensive cleaning of exam room while observing appropriate contact time as indicated for disinfecting solutions.      Return in about 6 months (around 02/13/2021) for htn/diabetes/CKD.    [name], MD [place]   08/15/2020

## 2020-08-15 NOTE — Patient Instructions (Signed)
-  checking labs today, depending on a1c f/u in 3-6 months...  Doing good!  Merry christmas and praying for you all,   D.r Rogers Blocker

## 2020-08-16 LAB — COMPLETE METABOLIC PANEL WITH GFR
AG Ratio: 1.4 (calc) (ref 1.0–2.5)
ALT: 15 U/L (ref 9–46)
AST: 20 U/L (ref 10–35)
Albumin: 4.2 g/dL (ref 3.6–5.1)
Alkaline phosphatase (APISO): 54 U/L (ref 35–144)
BUN/Creatinine Ratio: 23 (calc) — ABNORMAL HIGH (ref 6–22)
BUN: 39 mg/dL — ABNORMAL HIGH (ref 7–25)
CO2: 29 mmol/L (ref 20–32)
Calcium: 10.7 mg/dL — ABNORMAL HIGH (ref 8.6–10.3)
Chloride: 101 mmol/L (ref 98–110)
Creat: 1.66 mg/dL — ABNORMAL HIGH (ref 0.70–1.11)
GFR, Est African American: 44 mL/min/{1.73_m2} — ABNORMAL LOW (ref >=60)
GFR, Est Non African American: 38 mL/min/{1.73_m2} — ABNORMAL LOW (ref >=60)
Globulin: 2.9 g/dL (calc) (ref 1.9–3.7)
Glucose, Bld: 93 mg/dL (ref 65–99)
Potassium: 3.9 mmol/L (ref 3.5–5.3)
Sodium: 140 mmol/L (ref 135–146)
Total Bilirubin: 0.4 mg/dL (ref 0.2–1.2)
Total Protein: 7.1 g/dL (ref 6.1–8.1)

## 2020-08-16 LAB — CBC WITH DIFFERENTIAL/PLATELET
Absolute Monocytes: 465 cells/uL (ref 200–950)
Basophils Absolute: 73 cells/uL (ref 0–200)
Basophils Relative: 1.3 %
Eosinophils Absolute: 280 cells/uL (ref 15–500)
Eosinophils Relative: 5 %
HCT: 31.9 % — ABNORMAL LOW (ref 38.5–50.0)
Hemoglobin: 10.8 g/dL — ABNORMAL LOW (ref 13.2–17.1)
Lymphs Abs: 1753 cells/uL (ref 850–3900)
MCH: 33 pg (ref 27.0–33.0)
MCHC: 33.9 g/dL (ref 32.0–36.0)
MCV: 97.6 fL (ref 80.0–100.0)
MPV: 9.1 fL (ref 7.5–12.5)
Monocytes Relative: 8.3 %
Neutro Abs: 3030 cells/uL (ref 1500–7800)
Neutrophils Relative %: 54.1 %
Platelets: 304 10*3/uL (ref 140–400)
RBC: 3.27 10*6/uL — ABNORMAL LOW (ref 4.20–5.80)
RDW: 13.6 % (ref 11.0–15.0)
Total Lymphocyte: 31.3 %
WBC: 5.6 10*3/uL (ref 3.8–10.8)

## 2020-08-16 LAB — LIPID PANEL
Cholesterol: 253 mg/dL — ABNORMAL HIGH (ref <200)
HDL: 52 mg/dL (ref >=40)
LDL Cholesterol (Calc): 159 mg/dL (calc) — ABNORMAL HIGH
Non-HDL Cholesterol (Calc): 201 mg/dL (calc) — ABNORMAL HIGH (ref <130)
Total CHOL/HDL Ratio: 4.9 (calc) (ref <5.0)
Triglycerides: 258 mg/dL — ABNORMAL HIGH (ref <150)

## 2020-08-16 LAB — HEMOGLOBIN A1C
Hgb A1c MFr Bld: 6.6 % of total Hgb — ABNORMAL HIGH (ref <5.7)
Mean Plasma Glucose: 143 mg/dL
eAG (mmol/L): 7.9 mmol/L

## 2020-08-19 DIAGNOSIS — R338 Other retention of urine: Secondary | ICD-10-CM | POA: Diagnosis not present

## 2020-08-19 DIAGNOSIS — C61 Malignant neoplasm of prostate: Secondary | ICD-10-CM | POA: Diagnosis not present

## 2020-08-27 ENCOUNTER — Telehealth: Payer: Self-pay

## 2020-08-27 NOTE — Telephone Encounter (Signed)
Patient has been re-enrolled in [employer] for [place] at no cost 08/30/20-08/29/21.  [place] phone [phone]  Cruzita Lederer [facility] Phone [phone] Fax [phone] 08/27/2020 11:16 AM

## 2020-09-04 ENCOUNTER — Other Ambulatory Visit: Payer: Self-pay

## 2020-09-04 ENCOUNTER — Inpatient Hospital Stay: Payer: Medicare HMO | Attending: Oncology

## 2020-09-04 ENCOUNTER — Inpatient Hospital Stay: Payer: Medicare HMO

## 2020-09-04 ENCOUNTER — Inpatient Hospital Stay: Payer: Medicare HMO | Admitting: Oncology

## 2020-09-04 VITALS — BP 148/83 | HR 84 | Temp 97.7°F | Resp 16 | Ht 70.0 in | Wt 169.8 lb

## 2020-09-04 DIAGNOSIS — R232 Flushing: Secondary | ICD-10-CM | POA: Diagnosis not present

## 2020-09-04 DIAGNOSIS — C7951 Secondary malignant neoplasm of bone: Secondary | ICD-10-CM | POA: Diagnosis not present

## 2020-09-04 DIAGNOSIS — C61 Malignant neoplasm of prostate: Secondary | ICD-10-CM

## 2020-09-04 LAB — CBC WITH DIFFERENTIAL (CANCER CENTER ONLY)
Abs Immature Granulocytes: 0.04 10*3/uL (ref 0.00–0.07)
Basophils Absolute: 0.1 10*3/uL (ref 0.0–0.1)
Basophils Relative: 1 %
Eosinophils Absolute: 0.2 10*3/uL (ref 0.0–0.5)
Eosinophils Relative: 2 %
HCT: 34.4 % — ABNORMAL LOW (ref 39.0–52.0)
Hemoglobin: 11.6 g/dL — ABNORMAL LOW (ref 13.0–17.0)
Immature Granulocytes: 1 %
Lymphocytes Relative: 20 %
Lymphs Abs: 1.6 10*3/uL (ref 0.7–4.0)
MCH: 32.8 pg (ref 26.0–34.0)
MCHC: 33.7 g/dL (ref 30.0–36.0)
MCV: 97.2 fL (ref 80.0–100.0)
Monocytes Absolute: 0.5 10*3/uL (ref 0.1–1.0)
Monocytes Relative: 7 %
Neutro Abs: 5.4 10*3/uL (ref 1.7–7.7)
Neutrophils Relative %: 69 %
Platelet Count: 288 10*3/uL (ref 150–400)
RBC: 3.54 MIL/uL — ABNORMAL LOW (ref 4.22–5.81)
RDW: 14.4 % (ref 11.5–15.5)
WBC Count: 7.8 10*3/uL (ref 4.0–10.5)
nRBC: 0 % (ref 0.0–0.2)

## 2020-09-04 LAB — CMP (CANCER CENTER ONLY)
ALT: 24 U/L (ref 0–44)
AST: 23 U/L (ref 15–41)
Albumin: 3.8 g/dL (ref 3.5–5.0)
Alkaline Phosphatase: 55 U/L (ref 38–126)
Anion gap: 10 (ref 5–15)
BUN: 30 mg/dL — ABNORMAL HIGH (ref 8–23)
CO2: 28 mmol/L (ref 22–32)
Calcium: 10 mg/dL (ref 8.9–10.3)
Chloride: 103 mmol/L (ref 98–111)
Creatinine: 1.57 mg/dL — ABNORMAL HIGH (ref 0.61–1.24)
GFR, Estimated: 44 mL/min — ABNORMAL LOW (ref >60)
Glucose, Bld: 165 mg/dL — ABNORMAL HIGH (ref 70–99)
Potassium: 3.6 mmol/L (ref 3.5–5.1)
Sodium: 141 mmol/L (ref 135–145)
Total Bilirubin: 0.5 mg/dL (ref 0.3–1.2)
Total Protein: 7.5 g/dL (ref 6.5–8.1)

## 2020-09-04 MED ORDER — LEUPROLIDE ACETATE (4 MONTH) 30 MG ~~LOC~~ KIT
PACK | SUBCUTANEOUS | Status: AC
  Filled 2020-09-04: qty 30

## 2020-09-04 MED ORDER — CALCIUM CARBONATE-VITAMIN D 500-200 MG-UNIT PO TABS: 1.0000 | ORAL_TABLET | Freq: Two times a day (BID) | ORAL | 3 refills | Status: DC

## 2020-09-04 MED ORDER — LEUPROLIDE ACETATE (3 MONTH) 22.5 MG ~~LOC~~ KIT
22.5000 mg | PACK | Freq: Once | SUBCUTANEOUS | Status: AC
  Administered 2020-09-04: 22.5 mg via SUBCUTANEOUS

## 2020-09-04 NOTE — Progress Notes (Signed)
Hematology and Oncology Follow Up Visit  Wayne Perkins [id number] 08-14-1941 80 y.o. 09/04/2020 9:21 AM Wayne Perkins, MDWolfe, Wayne Hail, MD   Principle Diagnosis: 80 year old with castration-sensitive advanced prostate cancer with disease to the bone diagnosed in June 2021.  He was diagnosed in 2015 with a Gleason score of 6 and a PSA of 6.  Prior Therapy:  He was on active surveillance for a period of time and subsequently had a rise in his PSA that necessitated definitive treatment and received external beam radiation as a definitive treatment.    His PSA continues to rise subsequently and was started on androgen deprivation therapy for biochemically recurrent disease.  His PSA decreased down to 0.4 in November 2018.  Due to hot flashes and increased toxicities he was receiving it intermittently and was off androgen deprivation therapy in 2019 with a PSA rise up to 10.9.      He did receive another 6 months of androgen deprivation injection under the care of Dr. Alinda Money and received ADT at that time after his PSA was up to 24.7.  January 2021 is PSA was 4.27 and stopped ADT.    He started developing hematuria and subsequently had a CT scan hematuria protocol in June 2021.  Small sclerotic lesions noted throughout the visualized widespread metastatic disease.  His PSA at that time was 5.68 in April 2021.  Bone scan at that time did not show any evidence of metastatic disease except for a new midthoracic focal area of increased activity.   Xtandi 160 mg daily started in June 2021.  Therapy discontinued because of poor tolerance after 3 weeks.  Current therapy:  Eligard 22.5 mg every 3 months restarted on February 29, 2020.   Will receive injection today and repeated in 3 months.     Interim History: Wayne Perkins presents today for return follow-up.  Since the last visit, he reports no major changes in his health.  He did develop urinary retention in November 2021 which has resolved at this time.  He  has also reported issues with constipation and improved currently.  He denies any recent hospitalization since that time.  He denies any worsening bone pain or pathological fractures.  He denies any excessive fatigue or tiredness.  He denies any worsening hot flashes.     Medications: Unchanged on review. Current Outpatient Medications  Medication Sig Dispense Refill  . Alcohol Swabs (B-D SINGLE USE SWABS REGULAR) PADS     . betamethasone dipropionate 0.05 % cream Apply topically 2 (two) times daily. (Patient not taking: Reported on 08/15/2020) 50 g 1  . Blood Glucose Calibration (ACCU-CHEK AVIVA) SOLN     . blood glucose meter kit and supplies Use once a day in the AM for fasting sugars. 1 each 0  . calcium-vitamin D (OSCAL WITH D) 500-200 MG-UNIT tablet Take 1 tablet by mouth 2 (two) times daily. 60 tablet 3  . Cholecalciferol (VITAMIN D3) 50 MCG (2000 UT) TABS Take by mouth.    . clopidogrel (PLAVIX) 75 MG tablet TAKE 1 TABLET EVERY DAY 90 tablet 1  . Dulaglutide (TRULICITY) 9.52 WU/1.3KG SOPN Inject 0.75 mg into the skin once a week. 4 pen 0  . Ez Smart Blood Glucose Lancets MISC by Does not apply route. 100 each 3  . glucose blood test strip Use as instructed 100 each 3  . hydrochlorothiazide (HYDRODIURIL) 25 MG tablet TAKE 1 TABLET (25 MG TOTAL) BY MOUTH DAILY. 90 tablet 3  . hydrOXYzine (ATARAX/VISTARIL) 10 MG tablet Take  1 tablet (10 mg total) by mouth 3 (three) times daily as needed for itching. 90 tablet 1  . lactulose (CHRONULAC) 10 GM/15ML solution Take 15 mLs (10 g total) by mouth 2 (two) times daily as needed for moderate constipation or severe constipation. 236 mL 0  . lisinopril (ZESTRIL) 2.5 MG tablet Take 1 tablet (2.5 mg total) by mouth daily. 90 tablet 3  . Multiple Vitamin (MULTI-DAY VITAMINS PO) Take by mouth.    . Multiple Vitamins-Minerals (PRESERVISION AREDS PO) Take by mouth. Soft gels    . NIFEdipine (PROCARDIA XL/NIFEDICAL-XL) 90 MG 24 hr tablet TAKE 1 TABLET EVERY  DAY 90 tablet 1  . ondansetron (ZOFRAN ODT) 4 MG disintegrating tablet Take 1 tablet (4 mg total) by mouth every 8 (eight) hours as needed for nausea or vomiting. (Patient not taking: Reported on 08/15/2020) 20 tablet 0  . polyethylene glycol (MIRALAX / GLYCOLAX) 17 g packet Take 17 g by mouth daily.    . potassium chloride SA (KLOR-CON) 20 MEQ tablet TAKE 1 TABLET TWICE DAILY 180 tablet 0  . Saw Palmetto 450 MG CAPS Take by mouth.    . vitamin B-12 (CYANOCOBALAMIN) 1000 MCG tablet Take 1,000 mcg by mouth daily.     No current facility-administered medications for this visit.     Allergies:  Allergies  Allergen Reactions  . Glipizide Itching  . Statins Other (See Comments)    Causes narcolepsy      Physical Exam: Blood pressure (!) 148/83, pulse 84, temperature 97.7 F (36.5 C), temperature source Tympanic, resp. rate 16, height '5\' 10"'  (1.778 m), weight 169 lb 12.8 oz (77 kg), SpO2 99 %.    ECOG: 1    General appearance: Comfortable appearing without any discomfort Head: Normocephalic without any trauma Oropharynx: Mucous membranes are moist and pink without any thrush or ulcers. Eyes: Pupils are equal and round reactive to light. Lymph nodes: No cervical, supraclavicular, inguinal or axillary lymphadenopathy.   Heart:regular rate and rhythm.  S1 and S2 without leg edema. Lung: Clear without any rhonchi or wheezes.  No dullness to percussion. Abdomin: Soft, nontender, nondistended with good bowel sounds.  No hepatosplenomegaly. Musculoskeletal: No joint deformity or effusion.  Full range of motion noted. Neurological: No deficits noted on motor, sensory and deep tendon reflex exam. Skin: No petechial rash or dryness.  Appeared moist.        Lab Results: Lab Results  Component Value Date   WBC 5.6 08/15/2020   HGB 10.8 (L) 08/15/2020   HCT 31.9 (L) 08/15/2020   MCV 97.6 08/15/2020   PLT 304 08/15/2020     Chemistry      Component Value Date/Time   NA 140  08/15/2020 1123   K 3.9 08/15/2020 1123   CL 101 08/15/2020 1123   CO2 29 08/15/2020 1123   BUN 39 (H) 08/15/2020 1123   CREATININE 1.66 (H) 08/15/2020 1123      Component Value Date/Time   CALCIUM 10.7 (H) 08/15/2020 1123   ALKPHOS 55 06/04/2020 0902   AST 20 08/15/2020 1123   AST 19 06/04/2020 0902   ALT 15 08/15/2020 1123   ALT 15 06/04/2020 0902   BILITOT 0.4 08/15/2020 1123   BILITOT 0.5 06/04/2020 0902       Results for BILLYJACK, TROMPETER (MRN [id number]) as of 09/04/2020 09:22  Ref. Range 04/02/2020 09:15 06/04/2020 09:02  Prostate Specific Ag, Serum Latest Ref Range: 0.0 - 4.0 ng/mL 1.1 1.2     Impression and Plan:  80 year old man with:  1.  Castration-sensitive advanced prostate cancer with disease to the bone noted in June 2021.   He is currently on androgen deprivation therapy alone although his PSA is showing some slight rise.  Treatment options moving forward were discussed which included deprivation therapy alone versus therapy escalation.  He has declined further therapy after poor tolerance to [place].  Trial of Zytiga may be reasonable at this time if his PSA continues to rise.  Complication clinic hypertension, diabetes, edema among others were reviewed.  After discussion today, we opted to continue with androgen deprivation therapy along we will monitor his PSA and tentatively start Zytiga in the next 3 to 6 months.   2.   Androgen deprivation therapy: I recommended continuing this indefinitely.  He will receive it today and repeated in 3 months.  3.  Bone directed therapy: I recommended calcium and vitamin D supplements.  Delton See has been deferred till he obtains dental clearance.   4.  Follow-up: 3 months for repeat follow-up.   30  minutes were spent on this encounter.  The time was dedicated to reviewing disease status, treatment options and plan of care review.   Zola Button, MD 1/6/20229:21 AM

## 2020-09-04 NOTE — Patient Instructions (Signed)

## 2020-09-05 LAB — PROSTATE-SPECIFIC AG, SERUM (LABCORP): Prostate Specific Ag, Serum: 3.1 ng/mL (ref 0.0–4.0)

## 2020-09-09 ENCOUNTER — Encounter: Payer: Self-pay | Admitting: Family Medicine

## 2020-09-09 ENCOUNTER — Other Ambulatory Visit: Payer: Self-pay | Admitting: Family Medicine

## 2020-09-16 ENCOUNTER — Telehealth: Payer: Self-pay

## 2020-09-16 MED ORDER — POTASSIUM CHLORIDE CRYS ER 20 MEQ PO TBCR
20.0000 meq | EXTENDED_RELEASE_TABLET | Freq: Two times a day (BID) | ORAL | 1 refills | Status: DC
Start: 2020-09-16 — End: 2020-09-16

## 2020-09-16 MED ORDER — POTASSIUM CHLORIDE CRYS ER 20 MEQ PO TBCR: 20.0000 meq | EXTENDED_RELEASE_TABLET | Freq: Two times a day (BID) | ORAL | 0 refills | Status: DC

## 2020-09-16 NOTE — Telephone Encounter (Signed)
Please let him know I sent in 30 day supply to local [facility] as well as 90 day supply to his mail order.  Thanks! Dr. Rogers Blocker

## 2020-09-16 NOTE — Telephone Encounter (Signed)
  LAST APPOINTMENT DATE: 08/15/2020  NEXT APPOINTMENT DATE:@6 /20/2022  MEDICATION:  potassium chloride SA (KLOR-CON) 20 MEQ tablet  PHARMACY: [facility], [place]: Patient is completely out, and is needing a 30 day refill [facility] to [facility].

## 2020-09-17 NOTE — Telephone Encounter (Signed)
Lvm with message below per DPR.

## 2020-10-09 ENCOUNTER — Other Ambulatory Visit: Payer: Self-pay | Admitting: Family Medicine

## 2020-10-22 ENCOUNTER — Telehealth: Payer: Self-pay

## 2020-10-22 NOTE — Telephone Encounter (Signed)
Patient is expiercing some side effects from hydrochlorothiazide (HYDRODIURIL) 25 MG tablet Such as dry mouth and urine issues patient was not very descriptive and is requesting  A call back I explained to patient he would more than likely need a apt to discuss medication patient would like to see if we can give him something else

## 2020-10-22 NOTE — Telephone Encounter (Signed)
Please schedule visit so we can figure out what else we can put him on.  [name], MD [place]

## 2020-10-24 NOTE — Telephone Encounter (Signed)
Patient scheduled.

## 2020-10-27 ENCOUNTER — Encounter: Payer: Self-pay | Admitting: Family Medicine

## 2020-10-27 ENCOUNTER — Other Ambulatory Visit: Payer: Self-pay

## 2020-10-27 ENCOUNTER — Ambulatory Visit (INDEPENDENT_AMBULATORY_CARE_PROVIDER_SITE_OTHER): Payer: Medicare HMO | Admitting: Family Medicine

## 2020-10-27 VITALS — BP 168/80 | HR 88 | Temp 98.2°F | Ht 70.0 in | Wt 179.2 lb

## 2020-10-27 DIAGNOSIS — R682 Dry mouth, unspecified: Secondary | ICD-10-CM

## 2020-10-27 DIAGNOSIS — R351 Nocturia: Secondary | ICD-10-CM | POA: Diagnosis not present

## 2020-10-27 DIAGNOSIS — I2510 Atherosclerotic heart disease of native coronary artery without angina pectoris: Secondary | ICD-10-CM

## 2020-10-27 DIAGNOSIS — I10 Essential (primary) hypertension: Secondary | ICD-10-CM

## 2020-10-27 DIAGNOSIS — R350 Frequency of micturition: Secondary | ICD-10-CM | POA: Diagnosis not present

## 2020-10-27 LAB — COMPREHENSIVE METABOLIC PANEL
ALT: 17 U/L (ref 0–53)
AST: 21 U/L (ref 0–37)
Albumin: 4.4 g/dL (ref 3.5–5.2)
Alkaline Phosphatase: 56 U/L (ref 39–117)
BUN: 29 mg/dL — ABNORMAL HIGH (ref 6–23)
CO2: 34 mEq/L — ABNORMAL HIGH (ref 19–32)
Calcium: 10.7 mg/dL — ABNORMAL HIGH (ref 8.4–10.5)
Chloride: 98 mEq/L (ref 96–112)
Creatinine, Ser: 1.39 mg/dL (ref 0.40–1.50)
GFR: 47.97 mL/min — ABNORMAL LOW (ref >60.00)
Glucose, Bld: 93 mg/dL (ref 70–99)
Potassium: 3.5 mEq/L (ref 3.5–5.1)
Sodium: 141 mEq/L (ref 135–145)
Total Bilirubin: 0.4 mg/dL (ref 0.2–1.2)
Total Protein: 7.3 g/dL (ref 6.0–8.3)

## 2020-10-27 LAB — POCT URINALYSIS DIP (MANUAL ENTRY)
Bilirubin, UA: NEGATIVE
Blood, UA: NEGATIVE
Glucose, UA: NEGATIVE mg/dL
Ketones, POC UA: NEGATIVE mg/dL
Leukocytes, UA: NEGATIVE
Nitrite, UA: NEGATIVE
Protein Ur, POC: NEGATIVE mg/dL
Spec Grav, UA: 1.015 (ref 1.010–1.025)
Urobilinogen, UA: 0.2 E.U./dL
pH, UA: 7 (ref 5.0–8.0)

## 2020-10-27 LAB — CBC WITH DIFFERENTIAL/PLATELET
Basophils Absolute: 0.1 10*3/uL (ref 0.0–0.1)
Basophils Relative: 1 % (ref 0.0–3.0)
Eosinophils Absolute: 0.5 10*3/uL (ref 0.0–0.7)
Eosinophils Relative: 8.4 % — ABNORMAL HIGH (ref 0.0–5.0)
HCT: 36.9 % — ABNORMAL LOW (ref 39.0–52.0)
Hemoglobin: 12.7 g/dL — ABNORMAL LOW (ref 13.0–17.0)
Lymphocytes Relative: 28.1 % (ref 12.0–46.0)
Lymphs Abs: 1.7 10*3/uL (ref 0.7–4.0)
MCHC: 34.4 g/dL (ref 30.0–36.0)
MCV: 96 fl (ref 78.0–100.0)
Monocytes Absolute: 0.6 10*3/uL (ref 0.1–1.0)
Monocytes Relative: 9.4 % (ref 3.0–12.0)
Neutro Abs: 3.1 10*3/uL (ref 1.4–7.7)
Neutrophils Relative %: 53.1 % (ref 43.0–77.0)
Platelets: 249 10*3/uL (ref 150.0–400.0)
RBC: 3.84 Mil/uL — ABNORMAL LOW (ref 4.22–5.81)
RDW: 13.9 % (ref 11.5–15.5)
WBC: 5.9 10*3/uL (ref 4.0–10.5)

## 2020-10-27 LAB — TSH: TSH: 1.69 u[IU]/mL (ref 0.35–4.50)

## 2020-10-27 LAB — PSA: PSA: 6.98 ng/mL — ABNORMAL HIGH (ref 0.10–4.00)

## 2020-10-27 NOTE — Progress Notes (Signed)
Patient: Wayne Perkins MRN: [id number] DOB: 1941-03-01 PCP: [name], MD     Subjective:  Chief Complaint  Patient presents with  . Medication Problem    Pt says that he has experienced a lot of side effects from the HCTZ. He is requesting to try something different. He has self d/c BP medication until today.   . Nocturia    HPI: The patient is a 80 y.o. male who presents today for generalized itching, urinary frequency nocturia, and dry mouth only at night. He believes that these are all side effects to take HCTZ. He states symptoms have gotten worse in the last couple of weeks. He thought it was his prostate, but then googled his medication. He states he is urinating so much he is getting up every 30 minutes at night. He states if he gets 1.5 hours of sleep he is doing good. He states the dry mouth has been extremely bad, but this is only at night. He stopped the hctz last week, but started back this week until he saw me. He denies any dry eyes, and in fact feels like his eyes water all of the time. He has no dry mouth symptoms during the day. He drinks coffee in the AM (1.5 cups) and ginger-ale very small amount. He doesn't drink much water. He has no straining when he urinates and no weak stream. No dribbling.   He is on procardia 90mg /day for his blood pressure and the hctz 83m/day. He should be on lisinopril 2.5mg /day, but he stopped this as he read some things about it. Cant not tolerate beta blockers. States his home BP is in the 130s/80s.   He is requesting to see cardiology. History of CAD with stents First stent in 2004 and second stent in 2010  Allergy to statin and zetia. Can not tolerate beta blockers No issues, no chest pain, no shortness of breath.   His covid shots were done last year.   Review of Systems  Constitutional: Negative for appetite change, fatigue, fever and unexpected weight change.  Respiratory: Negative for shortness of breath.   Cardiovascular:  Negative for chest pain, palpitations and leg swelling.  Genitourinary: Positive for frequency. Negative for decreased urine volume, difficulty urinating, dysuria, flank pain, hematuria and urgency.       Nocturia   Musculoskeletal: Negative for back pain.  Neurological: Negative for dizziness and headaches.    Allergies Patient is allergic to glipizide and statins.  Past Medical History Patient  has a past medical history of Arthritis, Blood in stool, Childhood asthma, Diabetes mellitus without complication ([place]), Heart disease, Hypertension, essential, Prostate cancer ([place]), and Stroke ([place]).  Surgical History Patient  has a past surgical history that includes Ganglion cyst excision (Right); Coronary angioplasty with stent; and Prostate biopsy.  Family History [name]'s family history includes Depression in his brother; Early death in his brother; Heart disease in his brother; Hyperlipidemia in his mother and sister; Hypertension in his mother; Mental illness in his brother; Stroke in his father.  Social History Patient  reports that he has quit smoking. His smoking use included cigarettes. He has never used smokeless tobacco. He reports previous alcohol use. He reports that he does not use drugs.    Objective: Vitals:   10/27/20 1307 10/27/20 1355  BP: (!) 160/88 (!) 168/80  Pulse: 88   Temp: 98.2 F (36.8 C)   TempSrc: Temporal   SpO2: 96%   Weight: 179 lb 3.2 oz (81.3 kg)   Height: 5'  10" (1.778 m)     Body mass index is 25.71 kg/m.  Physical Exam Vitals reviewed.  Constitutional:      Appearance: Normal appearance. He is well-developed, normal weight and well-nourished.  HENT:     Head: Normocephalic and atraumatic.     Right Ear: External ear normal.     Left Ear: External ear normal.     Mouth/Throat:     Mouth: Oropharynx is clear and moist.  Eyes:     Extraocular Movements: EOM normal.     Conjunctiva/sclera: Conjunctivae normal.     Pupils: Pupils are  equal, round, and reactive to light.  Neck:     Thyroid: No thyromegaly.  Cardiovascular:     Rate and Rhythm: Normal rate and regular rhythm.     Pulses: Intact distal pulses.     Heart sounds: Normal heart sounds. No murmur heard.   Pulmonary:     Effort: Pulmonary effort is normal.     Breath sounds: Normal breath sounds.  Abdominal:     General: Abdomen is flat. Bowel sounds are normal. There is no distension.     Palpations: Abdomen is soft.     Tenderness: There is no abdominal tenderness.  Musculoskeletal:     Cervical back: Normal range of motion and neck supple.  Lymphadenopathy:     Cervical: No cervical adenopathy.  Skin:    General: Skin is warm and dry.     Capillary Refill: Capillary refill takes less than 2 seconds.     Findings: No rash.  Neurological:     General: No focal deficit present.     Mental Status: He is alert and oriented to person, place, and time.     Cranial Nerves: No cranial nerve deficit.     Coordination: Coordination normal.     Deep Tendon Reflexes: Reflexes normal.  Psychiatric:        Mood and Affect: Mood and affect and mood normal.        Behavior: Behavior normal.    UA: normal     Assessment/plan: 1. Hypertension, essential Above goal today and home readings much better. Discussed at this point I am not convinced it's the hctz causing all of his symptoms. He has been on this drug for years and has been fine and his main issue is at night. Would like to check labs before we stop this as I don't have a lot of other options to add on and I want to keep his blood pressure well controlled with his cardiac hx and TIA. Did discuss sounds more prostate related. Advised he also talk with urology while I do labs.   2. Dry mouth, unspecified Only at night. With it being so specific to night only I wonder if this is more of how he is sleeping, e.g. open mouth, snoring, dry room etc. Recommended he get a cool mist humidifier to run at night and  ask his wife to see if he is snoring or keeping mouth open while sleeping. Will check ANA, but Sjogren's seems quite low on differential with only dry mouth at night and no other symptoms.  - TSH - ANA, IFA Comprehensive Panel  3. Nocturia Again, I think this could be more prostate related. He doesn't drink hardly any water and def. Not much before bed. No caffeine really after 2pm. Will start with labs before I hold or change the hctz. - CBC with Differential/Platelet - Comprehensive metabolic panel - PSA - POCT urinalysis dipstick -  Urine Culture  4. Coronary artery disease involving native coronary artery of native heart without angina pectoris  - Ambulatory referral to Cardiology   This visit occurred during the SARS-CoV-2 public health emergency.  Safety protocols were in place, including screening questions prior to the visit, additional usage of staff PPE, and extensive cleaning of exam room while observing appropriate contact time as indicated for disinfecting solutions.     Return if symptoms worsen or fail to improve, for keep regular appointment at night .    [name], MD [place]   10/27/2020

## 2020-10-27 NOTE — Patient Instructions (Addendum)
-  stay on pill until I get your labs back. I would also talk to your urologist. You have been on hctz for a while so I would be surprised if it's this drug, but not out of the question.   -referral to cardiology done, dr. Harrell Gave.   -keep using biotene mouth wash. Put humidifier in room at night.   -i'll be in touch!   Dr. Rogers Blocker

## 2020-10-29 LAB — ANA, IFA COMPREHENSIVE PANEL
Anti Nuclear Antibody (ANA): NEGATIVE
ENA SM Ab Ser-aCnc: <1 AI
SM/RNP: <1 AI
SSA (Ro) (ENA) Antibody, IgG: <1 AI
SSB (La) (ENA) Antibody, IgG: <1 AI
Scleroderma (Scl-70) (ENA) Antibody, IgG: <1 AI
ds DNA Ab: <1 IU/mL

## 2020-10-29 LAB — URINE CULTURE
MICRO NUMBER:: 11586793
Result:: NO GROWTH
SPECIMEN QUALITY:: ADEQUATE

## 2020-11-07 ENCOUNTER — Telehealth: Payer: Self-pay

## 2020-11-07 NOTE — Telephone Encounter (Signed)
Patient is calling in stating he missed a call from the office and there was no vm.

## 2020-11-07 NOTE — Telephone Encounter (Signed)
Lvm to make pt aware that no call was made on today. I spoke directly with pt Thursday to notify him that paperwork was ready and can be faxed, but he stated he needed to put other material with paperwork and will pick it up at the front desk.

## 2020-11-25 ENCOUNTER — Telehealth: Payer: Self-pay

## 2020-11-25 ENCOUNTER — Inpatient Hospital Stay: Payer: Medicare HMO | Attending: Oncology

## 2020-11-25 ENCOUNTER — Telehealth: Payer: Self-pay | Admitting: Pharmacist

## 2020-11-25 ENCOUNTER — Inpatient Hospital Stay (HOSPITAL_BASED_OUTPATIENT_CLINIC_OR_DEPARTMENT_OTHER): Payer: Medicare HMO | Admitting: Oncology

## 2020-11-25 ENCOUNTER — Other Ambulatory Visit: Payer: Self-pay | Admitting: Oncology

## 2020-11-25 ENCOUNTER — Inpatient Hospital Stay: Payer: Medicare HMO

## 2020-11-25 ENCOUNTER — Other Ambulatory Visit: Payer: Self-pay

## 2020-11-25 VITALS — BP 146/76 | HR 79 | Temp 97.2°F | Resp 18 | Ht 70.0 in | Wt 179.4 lb

## 2020-11-25 DIAGNOSIS — C61 Malignant neoplasm of prostate: Secondary | ICD-10-CM | POA: Insufficient documentation

## 2020-11-25 DIAGNOSIS — C7951 Secondary malignant neoplasm of bone: Secondary | ICD-10-CM | POA: Diagnosis not present

## 2020-11-25 LAB — CMP (CANCER CENTER ONLY)
ALT: 15 U/L (ref 0–44)
AST: 21 U/L (ref 15–41)
Albumin: 4 g/dL (ref 3.5–5.0)
Alkaline Phosphatase: 67 U/L (ref 38–126)
Anion gap: 12 (ref 5–15)
BUN: 36 mg/dL — ABNORMAL HIGH (ref 8–23)
CO2: 31 mmol/L (ref 22–32)
Calcium: 10.1 mg/dL (ref 8.9–10.3)
Chloride: 100 mmol/L (ref 98–111)
Creatinine: 1.62 mg/dL — ABNORMAL HIGH (ref 0.61–1.24)
GFR, Estimated: 43 mL/min — ABNORMAL LOW (ref >60)
Glucose, Bld: 141 mg/dL — ABNORMAL HIGH (ref 70–99)
Potassium: 3.2 mmol/L — ABNORMAL LOW (ref 3.5–5.1)
Sodium: 143 mmol/L (ref 135–145)
Total Bilirubin: 0.3 mg/dL (ref 0.3–1.2)
Total Protein: 7.1 g/dL (ref 6.5–8.1)

## 2020-11-25 LAB — CBC WITH DIFFERENTIAL (CANCER CENTER ONLY)
Abs Immature Granulocytes: 0.01 10*3/uL (ref 0.00–0.07)
Basophils Absolute: 0 10*3/uL (ref 0.0–0.1)
Basophils Relative: 1 %
Eosinophils Absolute: 0.5 10*3/uL (ref 0.0–0.5)
Eosinophils Relative: 8 %
HCT: 35.9 % — ABNORMAL LOW (ref 39.0–52.0)
Hemoglobin: 12.3 g/dL — ABNORMAL LOW (ref 13.0–17.0)
Immature Granulocytes: 0 %
Lymphocytes Relative: 28 %
Lymphs Abs: 1.6 10*3/uL (ref 0.7–4.0)
MCH: 32.4 pg (ref 26.0–34.0)
MCHC: 34.3 g/dL (ref 30.0–36.0)
MCV: 94.5 fL (ref 80.0–100.0)
Monocytes Absolute: 0.5 10*3/uL (ref 0.1–1.0)
Monocytes Relative: 9 %
Neutro Abs: 3.1 10*3/uL (ref 1.7–7.7)
Neutrophils Relative %: 54 %
Platelet Count: 244 10*3/uL (ref 150–400)
RBC: 3.8 MIL/uL — ABNORMAL LOW (ref 4.22–5.81)
RDW: 13.1 % (ref 11.5–15.5)
WBC Count: 5.8 10*3/uL (ref 4.0–10.5)
nRBC: 0 % (ref 0.0–0.2)

## 2020-11-25 MED ORDER — LEUPROLIDE ACETATE (3 MONTH) 22.5 MG ~~LOC~~ KIT
PACK | SUBCUTANEOUS | Status: AC
  Filled 2020-11-25: qty 22.5

## 2020-11-25 MED ORDER — LEUPROLIDE ACETATE (3 MONTH) 22.5 MG ~~LOC~~ KIT
22.5000 mg | PACK | Freq: Once | SUBCUTANEOUS | Status: AC
  Administered 2020-11-25: 22.5 mg via SUBCUTANEOUS

## 2020-11-25 MED ORDER — ABIRATERONE ACETATE 250 MG PO TABS: 500.0000 mg | ORAL_TABLET | Freq: Every day | ORAL | 0 refills | Status: DC

## 2020-11-25 NOTE — Progress Notes (Signed)
Hematology and Oncology Follow Up Visit  Wayne Perkins [id number] [birth year] 80 y.o. 11/25/2020 3:45 PM [name], MDWolfe, [name], MD   Principle Diagnosis: 80 year old with advanced prostate cancer with disease to the bone diagnosed in June 2021.  He presented in 2015 with a Gleason score of 6 and a PSA of 6.  He is developing castration-resistant disease.  Prior Therapy:  He was on active surveillance for a period of time and subsequently had a rise in his PSA that necessitated definitive treatment and received external beam radiation as a definitive treatment.    His PSA continues to rise subsequently and was started on androgen deprivation therapy for biochemically recurrent disease.  His PSA decreased down to 0.4 in November 2018.  Due to hot flashes and increased toxicities he was receiving it intermittently and was off androgen deprivation therapy in 2019 with a PSA rise up to 10.9.      He did receive another 6 months of androgen deprivation injection under the care of Dr. Alinda Money and received ADT at that time after his PSA was up to 24.7.  January 2021 is PSA was 4.27 and stopped ADT.    He started developing hematuria and subsequently had a CT scan hematuria protocol in June 2021.  Small sclerotic lesions noted throughout the visualized widespread metastatic disease.  His PSA at that time was 5.68 in April 2021.  Bone scan at that time did not show any evidence of metastatic disease except for a new midthoracic focal area of increased activity.   Xtandi 160 mg daily started in June 2021.  Therapy discontinued because of poor tolerance after 3 weeks.  Current therapy:  Eligard 22.5 mg every 3 months restarted on February 29, 2020.   Next injection is scheduled for today and will be repeated in 3 months.     Interim History: Wayne Perkins returns today for repeat evaluation.  Since the last visit, he reports no major changes in his health.  He has reported periodic arthralgias and pain in  his hips and the backs of his legs.  He denies any pathological fractures or constitutional symptoms.  He denies any appetite changes or recent hospitalizations.     Medications: Reviewed without changes. Current Outpatient Medications  Medication Sig Dispense Refill  . Alcohol Swabs (B-D SINGLE USE SWABS REGULAR) PADS     . betamethasone dipropionate 0.05 % cream Apply topically 2 (two) times daily. (Patient not taking: No sig reported) 50 g 1  . Blood Glucose Calibration (ACCU-CHEK AVIVA) SOLN     . blood glucose meter kit and supplies Use once a day in the AM for fasting sugars. 1 each 0  . calcium-vitamin D (OSCAL WITH D) 500-200 MG-UNIT tablet Take 1 tablet by mouth 2 (two) times daily. 60 tablet 3  . Cholecalciferol (VITAMIN D3) 50 MCG (2000 UT) TABS Take by mouth.    . clopidogrel (PLAVIX) 75 MG tablet TAKE 1 TABLET EVERY DAY 90 tablet 1  . Dulaglutide (TRULICITY) 6.23 JS/2.8BT SOPN Inject 0.75 mg into the skin once a week. 4 pen 0  . Ez Smart Blood Glucose Lancets MISC by Does not apply route. 100 each 3  . glucose blood test strip Use as instructed 100 each 3  . hydrochlorothiazide (HYDRODIURIL) 25 MG tablet TAKE 1 TABLET (25 MG TOTAL) BY MOUTH DAILY. 90 tablet 3  . hydrOXYzine (ATARAX/VISTARIL) 10 MG tablet TAKE 1 TABLET BY MOUTH 3 TIMES DAILY AS NEEDED FOR ITCHING. 270 tablet 1  . KLOR-CON  M20 20 MEQ tablet TAKE 1 TABLET BY MOUTH TWICE A DAY 180 tablet 1  . lactulose (CHRONULAC) 10 GM/15ML solution Take 15 mLs (10 g total) by mouth 2 (two) times daily as needed for moderate constipation or severe constipation. 236 mL 0  . Multiple Vitamin (MULTI-DAY VITAMINS PO) Take by mouth.    . Multiple Vitamins-Minerals (PRESERVISION AREDS PO) Take by mouth. Soft gels    . NIFEdipine (PROCARDIA XL/NIFEDICAL-XL) 90 MG 24 hr tablet TAKE 1 TABLET EVERY DAY 90 tablet 1  . ondansetron (ZOFRAN ODT) 4 MG disintegrating tablet Take 1 tablet (4 mg total) by mouth every 8 (eight) hours as needed for  nausea or vomiting. (Patient not taking: No sig reported) 20 tablet 0  . polyethylene glycol (MIRALAX / GLYCOLAX) 17 g packet Take 17 g by mouth daily.    . Saw Palmetto 450 MG CAPS Take by mouth.    . vitamin B-12 (CYANOCOBALAMIN) 1000 MCG tablet Take 1,000 mcg by mouth daily.     No current facility-administered medications for this visit.     Allergies:  Allergies  Allergen Reactions  . Glipizide Itching  . Statins Other (See Comments)    Causes narcolepsy      Physical Exam: Blood pressure (!) 146/76, pulse 79, temperature (!) 97.2 F (36.2 C), temperature source Tympanic, resp. rate 18, height '5\' 10"'  (1.778 m), weight 179 lb 6.4 oz (81.4 kg), SpO2 98 %.     ECOG: 1    General appearance: Alert, awake without any distress. Head: Atraumatic without abnormalities Oropharynx: Without any thrush or ulcers. Eyes: No scleral icterus. Lymph nodes: No lymphadenopathy noted in the cervical, supraclavicular, or axillary nodes Heart:regular rate and rhythm, without any murmurs or gallops.   Lung: Clear to auscultation without any rhonchi, wheezes or dullness to percussion. Abdomin: Soft, nontender without any shifting dullness or ascites. Musculoskeletal: No clubbing or cyanosis. Neurological: No motor or sensory deficits. Skin: No rashes or lesions.        Lab Results: Lab Results  Component Value Date   WBC 5.8 11/25/2020   HGB 12.3 (L) 11/25/2020   HCT 35.9 (L) 11/25/2020   MCV 94.5 11/25/2020   PLT 244 11/25/2020     Chemistry      Component Value Date/Time   NA 141 10/27/2020 1402   K 3.5 10/27/2020 1402   CL 98 10/27/2020 1402   CO2 34 (H) 10/27/2020 1402   BUN 29 (H) 10/27/2020 1402   CREATININE 1.39 10/27/2020 1402   CREATININE 1.57 (H) 09/04/2020 0950   CREATININE 1.66 (H) 08/15/2020 1123      Component Value Date/Time   CALCIUM 10.7 (H) 10/27/2020 1402   ALKPHOS 56 10/27/2020 1402   AST 21 10/27/2020 1402   AST 23 09/04/2020 0950   ALT 17  10/27/2020 1402   ALT 24 09/04/2020 0950   BILITOT 0.4 10/27/2020 1402   BILITOT 0.5 09/04/2020 0950        Results for XACHARY, HAMBLY (MRN [id number]) as of 11/25/2020 14:59  Ref. Range 09/04/2020 09:50 10/27/2020 14:02  PSA Latest Ref Range: 0.10 - 4.00 ng/mL  6.98 (H)  Prostate Specific Ag, Serum Latest Ref Range: 0.0 - 4.0 ng/mL 3.1      Impression and Plan:   80 year old man with:  1.  Advanced prostate cancer with disease to the bone diagnosed in June 2021.  He is developing castration-resistant disease. .   The natural course of his disease was discussed at this time and treatment  options were reviewed.  His PSA is showing a rise despite being on androgen deprivation and castrate level testosterone.  Treatment options at this time including Zytiga or systemic chemotherapy.  He has been intolerant to [place] previously.  Risks and benefits of all these approaches were discussed at this time.  Potential complications associated with Zytiga specifically were reviewed including nausea, fatigue, hypertension, hypokalemia and adrenal insufficiency.  Given his comorbidities and prior complications associated with Xtandi, we will start at 500 mg daily without prednisone.  He is agreeable to proceed at this time.  We will update his staging scan including bone scan given his current symptoms.  It will not change our management at this time   2.   Androgen deprivation therapy: He will receive this today and repeated in 3 months.  This will continue indefinitely.  Complications including weight gain, hot flashes among others were reiterated.  3.  Bone directed therapy: He is currently on calcium and vitamin D supplements.  Delton See has been deferred for the time being.   4.  Follow-up: 3 months for repeat follow-up.   30  minutes were spent on this encounter.  The time was dedicated to reviewing disease status, treatment options and plan of care review.   Zola Button,  MD 3/29/20223:45 PM

## 2020-11-25 NOTE — Telephone Encounter (Signed)
Oral Oncology Patient Advocate Encounter  Received notification from [facility] that prior authorization for Wayne Perkins is required.  PA submitted on CoverMyMeds Key [id number] Status is pending  Oral Oncology Clinic will continue to follow.   [place] Patient [place] Phone [phone] Fax [phone] 11/25/2020 4:20 PM

## 2020-11-25 NOTE — Telephone Encounter (Signed)
Oral Oncology Pharmacist Encounter  Received new prescription for Zytiga (abiraterone acetate) for the treatment of castrate-resistant prostate cancer in conjunction with ADT, planned duration until disease progression or unacceptable drug toxicity.  Prescription dose and frequency assessed for appropriateness. Per MD starting at dose reduction and without prednisone due to patient comorbidities and issues with tolerance of Xtandi in the past.   CBC w/ Diff and CMP from 11/25/20 assessed, noted Scr of 1.62 (CrCl ~41.9 mL/min) - no renal dose adjustments required. Patient with K of 3.2 mmol/L - pt is on KCl replacement at home per medication list - will confirm patient is taking this as prescribed since there is risk of hypokalemia with Zytiga.  Current medication list in Epic reviewed, no relevant/significant DDIs with Zytiga identified.  Evaluated chart and no patient barriers to medication adherence noted.   Patient agreement for Zytiga treatment documented in MD note on 11/25/20.  Prescription has been e-scribed to the [facility] for benefits analysis and approval.  Oral Oncology Clinic will continue to follow for insurance authorization, copayment issues, initial counseling and start date.  Leron Croak, PharmD, BCPS Hematology/Oncology Clinical Pharmacist [facility] [phone] 11/25/2020 4:20 PM

## 2020-11-26 ENCOUNTER — Other Ambulatory Visit: Payer: Self-pay | Admitting: Family Medicine

## 2020-11-26 ENCOUNTER — Telehealth: Payer: Self-pay | Admitting: *Deleted

## 2020-11-26 LAB — PROSTATE-SPECIFIC AG, SERUM (LABCORP): Prostate Specific Ag, Serum: 9.7 ng/mL — ABNORMAL HIGH (ref 0.0–4.0)

## 2020-11-26 NOTE — Telephone Encounter (Signed)
-----   Message from Wyatt Portela, MD sent at 11/26/2020  8:58 AM EDT ----- Please let him know his PSA is up as expected.

## 2020-11-26 NOTE — Telephone Encounter (Signed)
Notified of message below

## 2020-11-26 NOTE — Telephone Encounter (Signed)
Oral Oncology Patient Advocate Encounter  Prior Authorization for Wayne Perkins has been approved.    PA# [id number] Effective dates: 11/25/20 through 08/29/21  Patients co-pay is $371.25  Oral Oncology Clinic will continue to follow.   [place] Patient [name] Phone [phone] Fax [phone] 11/26/2020 8:20 AM

## 2020-11-28 ENCOUNTER — Telehealth: Payer: Self-pay | Admitting: Pharmacist

## 2020-11-28 ENCOUNTER — Encounter: Payer: Self-pay | Admitting: Cardiology

## 2020-11-28 ENCOUNTER — Ambulatory Visit: Payer: Medicare HMO | Admitting: Cardiology

## 2020-11-28 ENCOUNTER — Other Ambulatory Visit: Payer: Self-pay

## 2020-11-28 VITALS — BP 120/86 | HR 82 | Ht 70.5 in | Wt 179.2 lb

## 2020-11-28 DIAGNOSIS — I10 Essential (primary) hypertension: Secondary | ICD-10-CM | POA: Diagnosis not present

## 2020-11-28 DIAGNOSIS — I2510 Atherosclerotic heart disease of native coronary artery without angina pectoris: Secondary | ICD-10-CM | POA: Diagnosis not present

## 2020-11-28 DIAGNOSIS — E1121 Type 2 diabetes mellitus with diabetic nephropathy: Secondary | ICD-10-CM | POA: Diagnosis not present

## 2020-11-28 DIAGNOSIS — Z8673 Personal history of transient ischemic attack (TIA), and cerebral infarction without residual deficits: Secondary | ICD-10-CM

## 2020-11-28 DIAGNOSIS — E782 Mixed hyperlipidemia: Secondary | ICD-10-CM

## 2020-11-28 NOTE — Telephone Encounter (Signed)
Oral Oncology Pharmacist Encounter  Met with patient while in clinic today to complete application for Wynetta Emery and [facility] Patient [facility] in an effort to reduce the patient's out of pocket expense for Zytiga to $0.  Application completed and faxed to: [phone]  [facility] phone number for follow up is: [phone]  This encounter will be updated until final determination.   Leron Croak, PharmD, BCPS Hematology/Oncology Clinical Pharmacist [facility] [phone] 11/28/2020 12:46 PM

## 2020-11-28 NOTE — Patient Instructions (Signed)
Medication Instructions:  Your Physician recommend you continue on your current medication as directed.    *If you need a refill on your cardiac medications before your next appointment, please call your pharmacy*   Lab Work: None   Testing/Procedures: None   Follow-Up: At [facility], you and your health needs are our priority.  As part of our continuing mission to provide you with exceptional heart care, we have created designated Provider Care Teams.  These Care Teams include your primary Cardiologist (physician) and Advanced Practice Providers (APPs -  Physician Assistants and Nurse Practitioners) who all work together to provide you with the care you need, when you need it.  We recommend signing up for the patient portal called "MyChart".  Sign up information is provided on this After Visit Summary.  MyChart is used to connect with patients for Virtual Visits (Telemedicine).  Patients are able to view lab/test results, encounter notes, upcoming appointments, etc.  Non-urgent messages can be sent to your provider as well.   To learn more about what you can do with MyChart, go to [email or web address].    Your next appointment:   6 month(s) @ [address] [place], [place] [address]   The format for your next appointment:   In Person  Provider:   Buford Dresser, MD

## 2020-11-28 NOTE — Progress Notes (Signed)
Cardiology Office Note:    Date:  11/28/2020   ID:  Wayne Perkins, DOB 08/19/1941, MRN [id number]  PCP:  Wolfe, Allison, MD  Cardiologist:  Bridgette Christopher, MD  Referring MD: Wolfe, Allison, MD   CC: new patient evaluation  History of Present Illness:    Wayne Perkins is a 80 y.o. male with a hx of TIA/CVA, CAD s/p prior stents, hypertension, type II diabetes with nephropathy/CKD, hyperlipidemia, malignant prostate cancer who is seen as a new consult at the request of Wolfe, Allison, MD for the evaluation and management of ASCVD and hypertension.  Note from Dr. Wolfe dated 10/27/20 reviewed. Noted that he self discontinued HCTZ due to urinary frequency and lisinopril due to concern for potential side effects. Does not tolerate beta blockers. Also with history of CAD and prior stents, does not tolerate statin or ezetimibe.  He is from [place], [place]. Had a cardiologist at [facility] in [place]. Had PCI in 2004 to "the left side" by Dr. Tobias at [facility] and brings angiogram pictures of his RCA pre/post PCI 2010 by Dr. Malosky at [facility]. Has not had a cardiologist in about ~10 years. Moved to [place] in 2019. 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  Has occasional heartburn, improves with drinking milk. No other chest discomfort. Angina symptom in 2004 was bilateral hand numbness, felt like he was going to pass out. Lost consciousness, was defibrillated by EMS, urgently went to cath lab. In 2010, felt fatigued, couldn't eat. Felt heaviness in his chest. Called EMS, brought to hospital, had cath the day after admission.   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   On clopidogrel long term. On trulicity as well. We discussed options for cholesterol medications.   Thinks he was only tried on one statin in 2004. Felt fatigued, almost fell asleep at the wheel. Thinks simvastatin sounds familiar. We discussed statins at length today. Since he is about to start a new prostate  cancer therapy, we will hold on starting statin at this time, but he is amenable to starting rosuvastatin 5 mg in the future.  Past Medical History:  Diagnosis Date   Arthritis    Blood in stool    Childhood asthma    Diabetes mellitus without complication (HCC)    type II   Heart disease    Hypertension, essential    Prostate cancer (HCC)    Stroke (HCC)     Past Surgical History:  Procedure Laterality Date   CORONARY ANGIOPLASTY WITH STENT PLACEMENT     GANGLION CYST EXCISION Right    PROSTATE BIOPSY      Current Medications: Current Outpatient Medications on File Prior to Visit  Medication Sig   abiraterone acetate (ZYTIGA) 250 MG tablet Take 2 tablets (500 mg total) by mouth daily. Take on an empty stomach 1 hour before or 2 hours after a meal   Alcohol Swabs (B-D SINGLE USE SWABS REGULAR) PADS    betamethasone dipropionate 0.05 % cream Apply topically 2 (two) times daily.   Blood Glucose Calibration (ACCU-CHEK AVIVA) SOLN    blood glucose meter kit and supplies Use once a day in the AM for fasting sugars.   calcium-vitamin D (OSCAL WITH D) 500-200 MG-UNIT tablet Take 1 tablet by mouth 2 (two) times daily.   Cholecalciferol (VITAMIN D3) 50 MCG (2000 UT) TABS Take by mouth.   clopidogrel (PLAVIX) 75 MG tablet TAKE 1 TABLET EVERY DAY   Dulaglutide (TRULICITY) 0.75 MG/0.5ML SOPN Inject 0.75   mg into the skin once a week.   Ez Smart Blood Glucose Lancets MISC by Does not apply route.   glucose blood test strip Use as instructed   hydrochlorothiazide (HYDRODIURIL) 25 MG tablet TAKE 1 TABLET (25 MG TOTAL) BY MOUTH DAILY.   hydrOXYzine (ATARAX/VISTARIL) 10 MG tablet TAKE 1 TABLET BY MOUTH 3 TIMES DAILY AS NEEDED FOR ITCHING.   KLOR-CON M20 20 MEQ tablet TAKE 1 TABLET BY MOUTH TWICE A DAY   lactulose (CHRONULAC) 10 GM/15ML solution Take 15 mLs (10 g total) by mouth 2 (two) times daily as needed for moderate constipation or severe constipation.   Multiple Vitamin (MULTI-DAY VITAMINS  PO) Take by mouth.   Multiple Vitamins-Minerals (PRESERVISION AREDS PO) Take by mouth. Soft gels   NIFEdipine (PROCARDIA XL/NIFEDICAL-XL) 90 MG 24 hr tablet TAKE 1 TABLET EVERY DAY   ondansetron (ZOFRAN ODT) 4 MG disintegrating tablet Take 1 tablet (4 mg total) by mouth every 8 (eight) hours as needed for nausea or vomiting.   polyethylene glycol (MIRALAX / GLYCOLAX) 17 g packet Take 17 g by mouth daily.   Saw Palmetto 450 MG CAPS Take by mouth.   vitamin B-12 (CYANOCOBALAMIN) 1000 MCG tablet Take 1,000 mcg by mouth daily.   No current facility-administered medications on file prior to visit.     Allergies:   Glipizide and Statins   Social History   Tobacco Use   Smoking status: Former Smoker    Types: Cigarettes   Smokeless tobacco: Never Used   Tobacco comment: as a teeenager  Vaping Use   Vaping Use: Never used  Substance Use Topics   Alcohol use: Not Currently   Drug use: Never    Family History: family history includes Depression in his brother; Early death in his brother; Heart disease in his brother; Hyperlipidemia in his mother and sister; Hypertension in his mother; Mental illness in his brother; Stroke in his father. There is no history of Prostate cancer, Breast cancer, Colon cancer, or Pancreatic cancer.  ROS:   Please see the history of present illness.  Additional pertinent ROS: Constitutional: Negative for chills, fever, night sweats, unintentional weight loss  HENT: Negative for ear pain and hearing loss.   Eyes: Negative for loss of vision and eye pain.  Respiratory: Negative for cough, sputum, wheezing.   Cardiovascular: See HPI. Gastrointestinal: Negative for abdominal pain, melena, and hematochezia.  Genitourinary: Negative for dysuria and hematuria.  Musculoskeletal: Negative for falls and myalgias.  Skin: Negative for itching and rash.  Neurological: Negative for focal weakness, focal sensory changes and loss of consciousness.  Endo/Heme/Allergies: Does  not bruise/bleed easily.     EKGs/Labs/Other Studies Reviewed:    The following studies were reviewed today: Echo 08/01/19 1. Left ventricular ejection fraction, by visual estimation, is 60 to  65%. The left ventricle has normal function. There is mildly increased  left ventricular hypertrophy.   2. Left ventricular diastolic parameters are consistent with Grade I  diastolic dysfunction (impaired relaxation).   3. Global right ventricle has normal systolic function.The right  ventricular size is normal. No increase in right ventricular wall  thickness.   4. Left atrial size was normal.   5. Right atrial size was normal.   6. Presence of pericardial fat pad.   7. Mild aortic valve annular calcification.   8. The mitral valve is grossly normal. Mild mitral valve regurgitation.   9. The tricuspid valve is grossly normal. Tricuspid valve regurgitation  is mild.  10. The aortic valve   is tricuspid. Aortic valve regurgitation is not  visualized.  11. The pulmonic valve was grossly normal. Pulmonic valve regurgitation is  trivial.  12. The ascending aorta was not well visualized.  13. TR signal is inadequate for assessing pulmonary artery systolic  pressure.  14. The inferior vena cava is normal in size with greater than 50%  respiratory variability, suggesting right atrial pressure of 3 mmHg.   EKG:  EKG is personally reviewed.  The ekg ordered today demonstrates sinus rhythm with 1st degree AV block, 82 bpm  Recent Labs: 10/27/2020: TSH 1.69 11/25/2020: ALT 15; BUN 36; Creatinine 1.62; Hemoglobin 12.3; Platelet Count 244; Potassium 3.2; Sodium 143  Recent Lipid Panel    Component Value Date/Time   CHOL 253 (H) 08/15/2020 1123   TRIG 258 (H) 08/15/2020 1123   HDL 52 08/15/2020 1123   CHOLHDL 4.9 08/15/2020 1123   VLDL 27 08/01/2019 0421   LDLCALC 159 (H) 08/15/2020 1123    Physical Exam:    VS:  BP 120/86   Pulse 82   Ht 5' 10.5" (1.791 m)   Wt 179 lb 3.2 oz (81.3 kg)    BMI 25.35 kg/m     Wt Readings from Last 3 Encounters:  11/28/20 179 lb 3.2 oz (81.3 kg)  11/25/20 179 lb 6.4 oz (81.4 kg)  10/27/20 179 lb 3.2 oz (81.3 kg)    GEN: Well nourished, well developed in no acute distress HEENT: Normal, moist mucous membranes NECK: No JVD CARDIAC: regular rhythm, normal S1 and S2, no rubs or gallops. No murmur. VASCULAR: Radial and DP pulses 2+ bilaterally. No carotid bruits RESPIRATORY:  Clear to auscultation without rales, wheezing or rhonchi  ABDOMEN: Soft, non-tender, non-distended MUSCULOSKELETAL:  Ambulates independently SKIN: Warm and dry, no edema NEUROLOGIC:  Alert and oriented x 3. No focal neuro deficits noted. PSYCHIATRIC:  Normal affect    ASSESSMENT:    1. Coronary artery disease involving native coronary artery of native heart without angina pectoris   2. Mixed hyperlipidemia   3. Hypertension, essential   4. Type 2 diabetes with nephropathy (HCC)   5. History of CVA (cerebrovascular accident)    PLAN:    History of TIA/CVA History of CAD with prior stents -does not tolerate beta blockers -continue clopidogrel -not on a statin. He is considering starting a new therapy for his prostate cancer. Will hold on adding statin for now, but would start rosuvastatin 5 mg daily if he is amenable. -no symptoms currently, counseled on red flag symptoms that need immediate medical attention -continue dulaglutide  Hypertension -systolic BP at goal today, diastolic just above goal -HCTZ gave him urinary frequency, he self discontinued. Willing to try again.  -he was concerned about risks with lisinopril, stopped on his own previously. -continue nifedipine  Type II diabetes with nephropathy -continue dulaglutide  Prostate cancer -follows with Dr. Shadad -discussing treatment options  Cardiac risk counseling and prevention recommendations: -recommend heart healthy/Mediterranean diet, with whole grains, fruits, vegetable, fish, lean meats,  nuts, and olive oil. Limit salt. -recommend moderate walking, 3-5 times/week for 30-50 minutes each session. Aim for at least 150 minutes.week. Goal should be pace of 3 miles/hours, or walking 1.5 miles in 30 minutes -recommend avoidance of tobacco products. Avoid excess alcohol. -ASCVD risk score: The ASCVD Risk score (Goff DC Jr., et al., 2013) failed to calculate for the following reasons:   The 2013 ASCVD risk score is only valid for ages 40 to 79   The patient has a prior   MI or stroke diagnosis    Plan for follow up: 6 mos or sooner as needed  Bridgette Christopher, MD, PhD, FACC [place]  [facility]    Medication Adjustments/Labs and Tests Ordered: Current medicines are reviewed at length with the patient today.  Concerns regarding medicines are outlined above.  Orders Placed This Encounter  Procedures   EKG 12-Lead   No orders of the defined types were placed in this encounter.   Patient Instructions  Medication Instructions:  Your Physician recommend you continue on your current medication as directed.    *If you need a refill on your cardiac medications before your next appointment, please call your pharmacy*   Lab Work: None   Testing/Procedures: None   Follow-Up: At [facility], you and your health needs are our priority.  As part of our continuing mission to provide you with exceptional heart care, we have created designated Provider Care Teams.  These Care Teams include your primary Cardiologist (physician) and Advanced Practice Providers (APPs -  Physician Assistants and Nurse Practitioners) who all work together to provide you with the care you need, when you need it.  We recommend signing up for the patient portal called "MyChart".  Sign up information is provided on this After Visit Summary.  MyChart is used to connect with patients for Virtual Visits (Telemedicine).  Patients are able to view lab/test results, encounter notes, upcoming  appointments, etc.  Non-urgent messages can be sent to your provider as well.   To learn more about what you can do with MyChart, go to [email or web address].    Your next appointment:   6 month(s) @ [address] [place], [place] [address]   The format for your next appointment:   In Person  Provider:   Bridgette Christopher, MD    Signed, Bridgette Christopher, MD PhD 11/28/2020 10:10 PM     [facility] 

## 2020-12-02 ENCOUNTER — Telehealth: Payer: Self-pay

## 2020-12-02 NOTE — Telephone Encounter (Signed)
Pt is aware, and a copy will be up front for him to pick up.

## 2020-12-02 NOTE — Telephone Encounter (Signed)
Paperwork was received and faxed back on 12/01/2020. Confirmation has been received.

## 2020-12-02 NOTE — Telephone Encounter (Signed)
Pt states he dropped off papers to the front office last week . There was a form Dr. Rogers Blocker was supposed to sign and fax. He called [facility] to see if the forms have been received, and they have not. I cannot locate any forms upfront. Madison, who received the forms, states she would have given them to [place]. We need to refax forms or call pt and get another copy if we cannot locate them. Please Advise

## 2020-12-02 NOTE — Telephone Encounter (Signed)
Patient is approved for Zytiga at no cost from [facility] 12/02/20-01/31/21.   Patient must sign Medicare attestation paper to be approved until 08/29/21.  Lina Sar uses [facility] [place] Phone [phone] Fax [phone] 12/02/2020 2:59 PM

## 2020-12-03 ENCOUNTER — Ambulatory Visit: Payer: Medicare HMO

## 2020-12-03 ENCOUNTER — Ambulatory Visit: Payer: Medicare HMO | Admitting: Oncology

## 2020-12-03 ENCOUNTER — Other Ambulatory Visit: Payer: Medicare HMO

## 2020-12-04 ENCOUNTER — Telehealth: Payer: Self-pay

## 2020-12-04 NOTE — Telephone Encounter (Signed)
[employer]:  Patient Assistance Program Approval  The patient meets program eligibility requirements and is enrolled in [place] until the end of the 2022 calendar year.  Pt has been contacted and sent a copy of approval letter.

## 2020-12-04 NOTE — Telephone Encounter (Signed)
Oral Chemotherapy Pharmacist Encounter   Attempted to reach patient to provide update and offer for initial counseling on oral medication: Zytiga (abiraterone acetate).  No answer. Left voicemail for patient to call back to discuss details of medication acquisition and initial counseling session.  Leron Croak, PharmD, BCPS Hematology/Oncology Clinical Pharmacist [facility] [phone] 12/04/2020 12:52 PM

## 2020-12-05 NOTE — Telephone Encounter (Signed)
Oral Chemotherapy Pharmacist Encounter  I spoke with patient for overview of: Zytiga (abiraterone acetate) for the treatment of castration-resistant prostate cancer in conjunction with ADT, planned duration until disease progression or unacceptable toxicity.   Counseled patient on administration, dosing, side effects, monitoring, drug-food interactions, safe handling, storage, and disposal.  Patient will take Zytiga 250mg  tablets, 2 tablets (500mg ) by mouth once daily on an empty stomach, 1 hour before or 2 hours after a meal.  Patient states he will take his Zytiga 2 hours after eating breakfast in the morning.  Patient will not be taking prednisone per MD.   Fabio Asa start date: patient will start once received from [facility] (patient to call and set up shipment on 12/05/20)  Adverse effects include but are not limited to: peripheral edema, GI upset, hypertension, hot flashes, fatigue, and arthralgias.    Reviewed with patient importance of keeping a medication schedule and plan for any missed doses. No barriers to medication adherence identified.  Medication reconciliation performed and medication/allergy list updated.  Insurance authorization for Fabio Asa has been obtained. Copay unaffordable for patient, so manufacturer assistance obtained through [place].   All questions answered.  Mr. Scallon voiced understanding and appreciation.   Medication education handout placed in mail for patient. Patient knows to call the office with questions or concerns. Oral Chemotherapy Clinic phone number provided to patient.   Leron Croak, PharmD, BCPS Hematology/Oncology Clinical Pharmacist [facility] [phone] 12/05/2020 12:42 PM

## 2020-12-13 ENCOUNTER — Other Ambulatory Visit: Payer: Self-pay | Admitting: Family Medicine

## 2020-12-23 ENCOUNTER — Other Ambulatory Visit: Payer: Self-pay

## 2020-12-23 ENCOUNTER — Encounter (HOSPITAL_COMMUNITY)
Admission: RE | Admit: 2020-12-23 | Discharge: 2020-12-23 | Disposition: A | Discharge status: Home / Self Care | Payer: Medicare HMO | Source: Ambulatory Visit | Attending: Oncology | Admitting: Oncology

## 2020-12-23 DIAGNOSIS — C61 Malignant neoplasm of prostate: Secondary | ICD-10-CM | POA: Diagnosis not present

## 2020-12-23 IMAGING — NM NM BONE WHOLE BODY
2 series · 2 of 2 positions shown · non-contrast
Comparison: Bone scan [DATE].

CLINICAL DATA: Prostate cancer.  Assess treatment response.

EXAM:
NUCLEAR MEDICINE WHOLE BODY BONE SCAN
TECHNIQUE: Whole body anterior and posterior images were obtained approximately
3 hours after intravenous injection of radiopharmaceutical.
RADIOPHARMACEUTICALS:  20.7 mCi [16] MDP IV

[Series 1: wbr_bone_40 whole body · 2.66mm/px · 1 of 1 slices shown (1 of 2)]
[im 1/1]
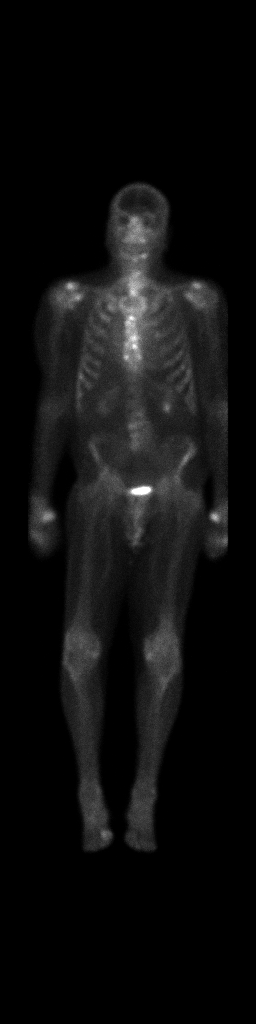

[Series 1: wbr_bone_40 whole body · 2.66mm/px · 1 of 1 slices shown (2 of 2)]
[im 1/1]
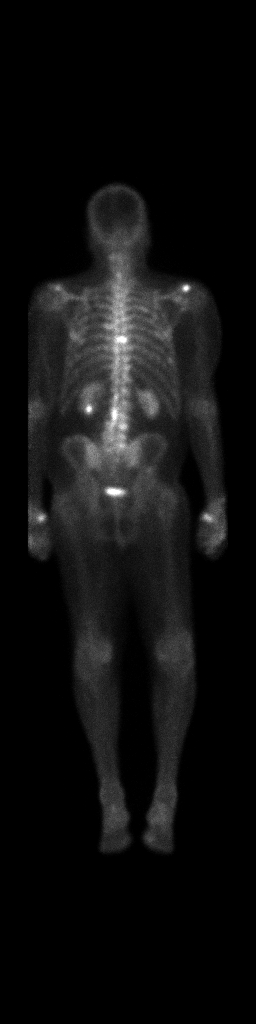

[2 of 2 positions shown; findings below may reference images not displayed]

FINDINGS: Bilateral renal function excretion. Focal area of increased activity
noted over the lower pole of the left kidney. Multifocal areas of
increased activity noted over the sternum. Increased intensity in
midthoracic vertebral activity. Stable multifocal lumbar activity.
Multiple subtle areas of increased activity noted over the ribs
bilaterally. Focal area of increased activity over the right
acetabulum. New focus of increased activity left mandible. Increased
activity over both shoulders.
IMPRESSION: 1. Focal area of increased activity noted over the lower pole of the
left kidney. This could be in a calyceal diverticulum. To exclude
significant focal abnormality renal ultrasound can be obtained.

2. Multiple areas of increased activity noted over the sternum.
Multiple new subtle areas of increased activity noted over the ribs
bilaterally. Increased uptake in previously identified area of
increased activity in midthoracic spine. Findings suggest
progressive metastatic disease.

3. New area of increased activity left mandible. This could be
related to dental disease. Metastatic disease cannot be excluded.
Multifocal activity lumbar spine is stable.

4. Focal area of increased activity noted the right acetabulum. This
could be degenerative. Metastatic disease cannot be excluded.

5.  Increased activity both shoulders, possibly degenerative.

## 2020-12-23 MED ORDER — TECHNETIUM TC 99M MEDRONATE IV KIT
20.0000 | PACK | Freq: Once | INTRAVENOUS | Status: AC | PRN
  Administered 2020-12-23: 20.7 via INTRAVENOUS

## 2020-12-26 ENCOUNTER — Telehealth: Payer: Self-pay | Admitting: *Deleted

## 2020-12-26 ENCOUNTER — Telehealth: Payer: Self-pay | Admitting: Pharmacist

## 2020-12-26 NOTE — Telephone Encounter (Signed)
Notified of results per Dr Alen Blew. Pt states he decided to wait for Bone Scan to start Zytiga. Is concerned about side effects and being around grandchildren.  Leron Croak pharmacist to call him

## 2020-12-26 NOTE — Telephone Encounter (Signed)
Wayne Perkins left a message requesting the results of his bone scan. Next appt 01/14/21

## 2020-12-26 NOTE — Telephone Encounter (Addendum)
Oral Chemotherapy Pharmacist Encounter   Spoke with patient today to follow up regarding patient's oral chemotherapy medication: Zytiga (abiraterone acetate)  Notified that patient has not started Zytiga due to concerns about side effects with medication.  Reviewed in depth the following concerns with patient:   Concern about being around grandchildren/great-grandchildren - discussed with patient that he is fine to be around all family members while on this medication. Discussed that medication should be stored away from where small children could access medication, but that being around his grandchildren would not harm them.   Concern about side effect of electrolyte imbalances, specifically hypokalemia - patient is currently on potassium chloride 20 mEq BID, potassium variable over past few months. Discussed with patient that he should be adherent with his potassium supplement, but also that patient is being started on half the the traditional starting dose of Zytiga, specifically so patient tolerates medication better in the beginning. Patient aware that his labs would be monitored regularly at the office.  Handling of bodily fluids - discussed with patient proper ways to handle bodily fluids if toilet or clothes are soiled. Wife expressed concern about sexual intercourse - reviewed with both patient and his wife that it is safe to hug/kiss, and recommend use of barrier method, such as a condom during intercourse.   Reviewed with patient, again, most common side effects seen with Zytiga including, but not limited to peripheral edema, GI upset, HTN, hot flashes, fatigue and arthralgias. We discussed that not all side effects are guaranteed to occur, and that the benefit of taking Zytiga for his prostate cancer outweighs the risk of side effects.   At end of conversation patient appreciative of reviewing concerns. He was encouraged to let Dr. Alen Blew and his team know his decision regarding his  Zytiga.   Leron Croak, PharmD, BCPS Hematology/Oncology Clinical Pharmacist [facility] [phone] 12/26/2020 2:13 PM

## 2020-12-31 ENCOUNTER — Telehealth: Payer: Self-pay | Admitting: *Deleted

## 2020-12-31 NOTE — Telephone Encounter (Signed)
Patient called. He wanted to let Dr. Alen Blew know that the Br[place] arrived and there were 2 pages of side effects with package. He has decided not to take it. He and his wife read the insert and discussed whether he should take Zytiga. He said he did not want to put anything in his body that could do all that to him so he will not be taking it. Advised him that Dr. Alen Blew will receive this message.

## 2021-01-12 ENCOUNTER — Telehealth: Payer: Self-pay | Admitting: *Deleted

## 2021-01-12 NOTE — Telephone Encounter (Signed)
-----   Message from Wyatt Portela, MD sent at 01/12/2021 11:06 AM EDT ----- Regarding: RE: Appointment question Please move his May 18 appointment to: Lab + MD (12:00) + Injection on 621 ----- Message ----- From: Rolene Course, RN Sent: 01/12/2021  10:35 AM EDT To: Wyatt Portela, MD Subject: Appointment question                           Mr. Pappalardo called this morning, he has a lab & MD appointment on May 18.  He also said he will be due for an eligard injection in June, he doesn't have an appointment for this however.  He is asking if his May appointments can be moved to June to coincide with his upcoming injection appointment. Please advise, Bethena Roys

## 2021-01-12 NOTE — Telephone Encounter (Signed)
Scheduling message sent to change appointments.

## 2021-01-12 NOTE — Telephone Encounter (Signed)
PC to patient, no answer, left voicemail - appointments on May 18 will be canceled, will be rescheduling lab/MD/injection appointments on June 21 per Dr. Alen Blew.  Scheduling will be contacting patient to inform him of appointment times.  Informed patient to call office with any other questions/concerns [phone]

## 2021-01-14 ENCOUNTER — Other Ambulatory Visit: Payer: Medicare HMO

## 2021-01-14 ENCOUNTER — Ambulatory Visit: Payer: Medicare HMO | Admitting: Oncology

## 2021-01-19 ENCOUNTER — Telehealth: Payer: Self-pay | Admitting: Oncology

## 2021-01-19 NOTE — Telephone Encounter (Signed)
Reschedule appointment per 05/16 schedule message. Patient is aware.

## 2021-01-21 ENCOUNTER — Other Ambulatory Visit: Payer: Self-pay | Admitting: Family Medicine

## 2021-01-21 ENCOUNTER — Telehealth: Payer: Self-pay | Admitting: Family Medicine

## 2021-01-21 MED ORDER — BETAMETHASONE DIPROPIONATE 0.05 % EX CREA: TOPICAL_CREAM | Freq: Two times a day (BID) | CUTANEOUS | 1 refills | Status: DC

## 2021-01-21 NOTE — Telephone Encounter (Signed)
Ok to establish 

## 2021-01-21 NOTE — Telephone Encounter (Signed)
Please set up one month f/u for diabetes/htn/TOC with dr. Birdie Riddle, okay with her.  Please call patient with new appointment,  Dr. Rogers Blocker

## 2021-01-21 NOTE — Telephone Encounter (Signed)
Patient and his wife would like to transfer to dr. Birdie Riddle since im leaving for hospital.  Please let me know so I can schedule follow up!   Aw

## 2021-01-22 ENCOUNTER — Telehealth: Payer: Self-pay | Admitting: Cardiology

## 2021-01-22 NOTE — Telephone Encounter (Signed)
The patient called and wanted to let Dr. Harrell Gave know that he ended up not taking the medication from his Urologist.   Dr. Harrell Gave discussed starting a new medication with the patient at his initial appointment. The patient then mentioned that his Urologist was going to start him on some medication, and she wanted to wait so the patient wasn't taking two new medications at once.  The patient just wants to know what to do next. Please advise

## 2021-01-27 ENCOUNTER — Telehealth: Payer: Self-pay | Admitting: Family Medicine

## 2021-01-27 NOTE — Telephone Encounter (Signed)
At the request of Dr Rogers Blocker, I agree to take this couple

## 2021-01-27 NOTE — Telephone Encounter (Signed)
Patient states that Dr. Birdie Riddle agreed to see him and his wife as new patients - please advise

## 2021-01-30 NOTE — Telephone Encounter (Signed)
Looks like pt was scheduled for 02/27/21.

## 2021-02-11 ENCOUNTER — Other Ambulatory Visit: Payer: Self-pay | Admitting: Family Medicine

## 2021-02-12 NOTE — Telephone Encounter (Signed)
Rx not on patient current list  Please advise

## 2021-02-16 ENCOUNTER — Ambulatory Visit: Payer: Medicare HMO | Admitting: Family Medicine

## 2021-02-17 ENCOUNTER — Inpatient Hospital Stay: Payer: Medicare HMO | Admitting: Oncology

## 2021-02-17 ENCOUNTER — Other Ambulatory Visit: Payer: Self-pay

## 2021-02-17 ENCOUNTER — Inpatient Hospital Stay: Payer: Medicare HMO

## 2021-02-17 ENCOUNTER — Inpatient Hospital Stay: Payer: Medicare HMO | Attending: Oncology

## 2021-02-17 VITALS — BP 142/79 | HR 78 | Temp 96.8°F | Resp 18 | Ht 70.0 in | Wt 174.5 lb

## 2021-02-17 DIAGNOSIS — C61 Malignant neoplasm of prostate: Secondary | ICD-10-CM | POA: Insufficient documentation

## 2021-02-17 DIAGNOSIS — C7951 Secondary malignant neoplasm of bone: Secondary | ICD-10-CM | POA: Diagnosis not present

## 2021-02-17 LAB — CMP (CANCER CENTER ONLY)
ALT: 15 U/L (ref 0–44)
AST: 22 U/L (ref 15–41)
Albumin: 4.2 g/dL (ref 3.5–5.0)
Alkaline Phosphatase: 78 U/L (ref 38–126)
Anion gap: 8 (ref 5–15)
BUN: 34 mg/dL — ABNORMAL HIGH (ref 8–23)
CO2: 30 mmol/L (ref 22–32)
Calcium: 10.3 mg/dL (ref 8.9–10.3)
Chloride: 102 mmol/L (ref 98–111)
Creatinine: 1.6 mg/dL — ABNORMAL HIGH (ref 0.61–1.24)
GFR, Estimated: 43 mL/min — ABNORMAL LOW (ref >60)
Glucose, Bld: 126 mg/dL — ABNORMAL HIGH (ref 70–99)
Potassium: 3.5 mmol/L (ref 3.5–5.1)
Sodium: 140 mmol/L (ref 135–145)
Total Bilirubin: 0.7 mg/dL (ref 0.3–1.2)
Total Protein: 7.5 g/dL (ref 6.5–8.1)

## 2021-02-17 LAB — CBC WITH DIFFERENTIAL (CANCER CENTER ONLY)
Abs Immature Granulocytes: 0.05 10*3/uL (ref 0.00–0.07)
Basophils Absolute: 0.1 10*3/uL (ref 0.0–0.1)
Basophils Relative: 1 %
Eosinophils Absolute: 0.3 10*3/uL (ref 0.0–0.5)
Eosinophils Relative: 5 %
HCT: 34.4 % — ABNORMAL LOW (ref 39.0–52.0)
Hemoglobin: 12 g/dL — ABNORMAL LOW (ref 13.0–17.0)
Immature Granulocytes: 1 %
Lymphocytes Relative: 19 %
Lymphs Abs: 1.2 10*3/uL (ref 0.7–4.0)
MCH: 33.3 pg (ref 26.0–34.0)
MCHC: 34.9 g/dL (ref 30.0–36.0)
MCV: 95.6 fL (ref 80.0–100.0)
Monocytes Absolute: 0.4 10*3/uL (ref 0.1–1.0)
Monocytes Relative: 7 %
Neutro Abs: 4.2 10*3/uL (ref 1.7–7.7)
Neutrophils Relative %: 67 %
Platelet Count: 316 10*3/uL (ref 150–400)
RBC: 3.6 MIL/uL — ABNORMAL LOW (ref 4.22–5.81)
RDW: 13.2 % (ref 11.5–15.5)
WBC Count: 6.2 10*3/uL (ref 4.0–10.5)
nRBC: 0 % (ref 0.0–0.2)

## 2021-02-17 MED ORDER — LEUPROLIDE ACETATE (3 MONTH) 22.5 MG ~~LOC~~ KIT
22.5000 mg | PACK | Freq: Once | SUBCUTANEOUS | Status: AC
  Administered 2021-02-17: 22.5 mg via SUBCUTANEOUS

## 2021-02-17 MED ORDER — LEUPROLIDE ACETATE (3 MONTH) 22.5 MG ~~LOC~~ KIT
PACK | SUBCUTANEOUS | Status: AC
  Filled 2021-02-17: qty 22.5

## 2021-02-17 NOTE — Patient Instructions (Signed)
Leuprolide depot injection What is this medication? LEUPROLIDE (loo PROE lide) is a man-made protein that acts like a natural hormone in the body. It decreases testosterone in men and decreases estrogen in women. In men, this medicine is used to treat advanced prostate cancer. In women, some forms of this medicine may be used to treat endometriosis, uterinefibroids, or other male hormone-related problems. This medicine may be used for other purposes; ask your health care provider orpharmacist if you have questions. COMMON BRAND NAME(S): Eligard, Fensolv, Lupron Depot, Lupron Depot-Ped, Viadur What should I tell my care team before I take this medication? They need to know if you have any of these conditions: diabetes heart disease or previous heart attack high blood pressure high cholesterol mental illness osteoporosis pain or difficulty passing urine seizures spinal cord metastasis stroke suicidal thoughts, plans, or attempt; a previous suicide attempt by you or a family member tobacco smoker unusual vaginal bleeding (women) an unusual or allergic reaction to leuprolide, benzyl alcohol, other medicines, foods, dyes, or preservatives pregnant or trying to get pregnant breast-feeding How should I use this medication? This medicine is for injection into a muscle or for injection under the skin. It is given by a health care professional in a hospital or clinic setting. The specific product will determine how it will be given to you. Make sure youunderstand which product you receive and how often you will receive it. Talk to your pediatrician regarding the use of this medicine in children.Special care may be needed. Overdosage: If you think you have taken too much of this medicine contact apoison control center or emergency room at once. NOTE: This medicine is only for you. Do not share this medicine with others. What if I miss a dose? It is important not to miss a dose. Call your doctor  or health careprofessional if you are unable to keep an appointment. Depot injections: Depot injections are given either once-monthly, every 12 weeks, every 16 weeks, or every 24 weeks depending on the product you are prescribed. The product you are prescribed will be based on if you are male orfemale, and your condition. Make sure you understand your product and dosing. What may interact with this medication? Do not take this medicine with any of the following medications: chasteberry cisapride dronedarone pimozide thioridazine This medicine may also interact with the following medications: herbal or dietary supplements, like black cohosh or DHEA male hormones, like estrogens or progestins and birth control pills, patches, rings, or injections male hormones, like testosterone other medicines that prolong the QT interval (abnormal heart rhythm) This list may not describe all possible interactions. Give your health care provider a list of all the medicines, herbs, non-prescription drugs, or dietary supplements you use. Also tell them if you smoke, drink alcohol, or use illegaldrugs. Some items may interact with your medicine. What should I watch for while using this medication? Visit your doctor or health care professional for regular checks on your progress. During the first weeks of treatment, your symptoms may get worse, but then will improve as you continue your treatment. You may get hot flashes, increased bone pain, increased difficulty passing urine, or an aggravation of nerve symptoms. Discuss these effects with your doctor or health careprofessional, some of them may improve with continued use of this medicine. Male patients may experience a menstrual cycle or spotting during the first months of therapy with this medicine. If this continues, contact your doctor orhealth care professional. This medicine may increase blood sugar. Ask  your healthcare provider if changesin diet or medicines  are needed if you have diabetes. What side effects may I notice from receiving this medication? Side effects that you should report to your doctor or health care professionalas soon as possible: allergic reactions like skin rash, itching or hives, swelling of the face, lips, or tongue breathing problems chest pain depression or memory disorders pain in your legs or groin pain at site where injected or implanted seizures severe headache signs and symptoms of high blood sugar such as being more thirsty or hungry or having to urinate more than normal. You may also feel very tired or have blurry vision swelling of the feet and legs suicidal thoughts or other mood changes visual changes vomiting Side effects that usually do not require medical attention (report to yourdoctor or health care professional if they continue or are bothersome): breast swelling or tenderness decrease in sex drive or performance diarrhea hot flashes loss of appetite muscle, joint, or bone pains nausea redness or irritation at site where injected or implanted skin problems or acne This list may not describe all possible side effects. Call your doctor for medical advice about side effects. You may report side effects to FDA at1-800-FDA-1088. Where should I keep my medication? This drug is given in a hospital or clinic and will not be stored at home. NOTE: This sheet is a summary. It may not cover all possible information. If you have questions about this medicine, talk to your doctor, pharmacist, orhealth care provider.  2022 Elsevier/Gold Standard (2019-07-18 10:35:13)

## 2021-02-17 NOTE — Progress Notes (Signed)
Hematology and Oncology Follow Up Visit  Wayne Perkins [id number] 08/23/1941 79 y.o. 02/17/2021 11:35 AM Wolfe, Allison, MDWolfe, Allison, MD   Principle Diagnosis: 79-year-old man with castration-resistant advanced prostate cancer with disease to the bone diagnosed in June 2021.  He was found to have Gleason score of 6 and a PSA of 6 in 2015 at time of diagnosis.    Prior Therapy:  He was on active surveillance for a period of time and subsequently had a rise in his PSA that necessitated definitive treatment and received external beam radiation as a definitive treatment.    His PSA continues to rise subsequently and was started on androgen deprivation therapy for biochemically recurrent disease.  His PSA decreased down to 0.4 in November 2018.  Due to hot flashes and increased toxicities he was receiving it intermittently and was off androgen deprivation therapy in 2019 with a PSA rise up to 10.9.      He did receive another 6 months of androgen deprivation injection under the care of Dr. Borden and received ADT at that time after his PSA was up to 24.7.  January 2021 is PSA was 4.27 and stopped ADT.    He started developing hematuria and subsequently had a CT scan hematuria protocol in June 2021.  Small sclerotic lesions noted throughout the visualized widespread metastatic disease.  His PSA at that time was 5.68 in April 2021.  Bone scan at that time did not show any evidence of metastatic disease except for a new midthoracic focal area of increased activity.   Xtandi 160 mg daily started in June 2021.  Therapy discontinued because of poor tolerance after 3 weeks.  Current therapy:  Eligard 22.5 mg every 3 months restarted on February 29, 2020.   Next injection is scheduled for today and will be repeated in 3 months.  Zytiga 500 mg daily with prednisone 5 mg daily started in May 2022.  Therapy discontinued based on patient's preference.   Interim History: Wayne Perkins is here for a follow-up visit.   Since the last visit, Wayne Perkins reports feeling reasonably well without any major complaints.  He is started taking Zytiga and subsequently discontinued after short period of time.  He had denies any specific side effects but did not like the idea of taking any additional treatment.  He denies any bone pain, pathological fractures recent falls or syncope.  He remains active and attends to activities of daily living.     Medications: Updated on review. Current Outpatient Medications  Medication Sig Dispense Refill   potassium chloride SA (KLOR-CON) 20 MEQ tablet TAKE 1 TABLET TWICE DAILY 180 tablet 1   abiraterone acetate (ZYTIGA) 250 MG tablet TAKE 2 TABLETS (500 MG TOTAL) BY MOUTH DAILY. TAKE ON AN EMPTY STOMACH 1 HOUR BEFORE OR 2 HOURS AFTER A MEAL 120 tablet 0   Alcohol Swabs (B-D SINGLE USE SWABS REGULAR) PADS      betamethasone dipropionate 0.05 % cream Apply topically 2 (two) times daily. 50 g 1   Blood Glucose Calibration (ACCU-CHEK AVIVA) SOLN      blood glucose meter kit and supplies Use once a day in the AM for fasting sugars. 1 each 0   calcium-vitamin D (OSCAL WITH D) 500-200 MG-UNIT tablet Take 1 tablet by mouth 2 (two) times daily. 60 tablet 3   Cholecalciferol (VITAMIN D3) 50 MCG (2000 UT) TABS Take by mouth.     clopidogrel (PLAVIX) 75 MG tablet TAKE 1 TABLET EVERY DAY 90 tablet 1     Dulaglutide (TRULICITY) 0.75 MG/0.5ML SOPN Inject 0.75 mg into the skin once a week. 4 pen 0   Ez Smart Blood Glucose Lancets MISC by Does not apply route. 100 each 3   glucose blood test strip Use as instructed 100 each 3   hydrochlorothiazide (HYDRODIURIL) 25 MG tablet TAKE 1 TABLET EVERY DAY 90 tablet 3   hydrOXYzine (ATARAX/VISTARIL) 10 MG tablet TAKE 1 TABLET BY MOUTH 3 TIMES DAILY AS NEEDED FOR ITCHING. 270 tablet 1   lactulose (CHRONULAC) 10 GM/15ML solution Take 15 mLs (10 g total) by mouth 2 (two) times daily as needed for moderate constipation or severe constipation. 236 mL 0   Multiple  Vitamin (MULTI-DAY VITAMINS PO) Take by mouth.     Multiple Vitamins-Minerals (PRESERVISION AREDS PO) Take by mouth. Soft gels     NIFEdipine (PROCARDIA XL/NIFEDICAL-XL) 90 MG 24 hr tablet TAKE 1 TABLET EVERY DAY 90 tablet 1   ondansetron (ZOFRAN ODT) 4 MG disintegrating tablet Take 1 tablet (4 mg total) by mouth every 8 (eight) hours as needed for nausea or vomiting. 20 tablet 0   polyethylene glycol (MIRALAX / GLYCOLAX) 17 g packet Take 17 g by mouth daily.     Saw Palmetto 450 MG CAPS Take by mouth.     vitamin B-12 (CYANOCOBALAMIN) 1000 MCG tablet Take 1,000 mcg by mouth daily.     No current facility-administered medications for this visit.     Allergies:  Allergies  Allergen Reactions   Glipizide Itching   Statins Other (See Comments)    Causes narcolepsy      Physical Exam:   Blood pressure (!) 142/79, pulse 78, temperature (!) 96.8 F (36 C), temperature source Tympanic, resp. rate 18, height 5' 10" (1.778 m), weight 174 lb 8 oz (79.2 kg), SpO2 98 %.    ECOG: 1    General appearance: Comfortable appearing without any discomfort Head: Normocephalic without any trauma Oropharynx: Mucous membranes are moist and pink without any thrush or ulcers. Eyes: Pupils are equal and round reactive to light. Lymph nodes: No cervical, supraclavicular, inguinal or axillary lymphadenopathy.   Heart:regular rate and rhythm.  S1 and S2 without leg edema. Lung: Clear without any rhonchi or wheezes.  No dullness to percussion. Abdomin: Soft, nontender, nondistended with good bowel sounds.  No hepatosplenomegaly. Musculoskeletal: No joint deformity or effusion.  Full range of motion noted. Neurological: No deficits noted on motor, sensory and deep tendon reflex exam. Skin: No petechial rash or dryness.  Appeared moist.          Lab Results: Lab Results  Component Value Date   WBC 5.8 11/25/2020   HGB 12.3 (L) 11/25/2020   HCT 35.9 (L) 11/25/2020   MCV 94.5 11/25/2020    PLT 244 11/25/2020     Chemistry      Component Value Date/Time   NA 143 11/25/2020 1517   K 3.2 (L) 11/25/2020 1517   CL 100 11/25/2020 1517   CO2 31 11/25/2020 1517   BUN 36 (H) 11/25/2020 1517   CREATININE 1.62 (H) 11/25/2020 1517   CREATININE 1.66 (H) 08/15/2020 1123      Component Value Date/Time   CALCIUM 10.1 11/25/2020 1517   ALKPHOS 67 11/25/2020 1517   AST 21 11/25/2020 1517   ALT 15 11/25/2020 1517   BILITOT 0.3 11/25/2020 1517        Results for Perkins, Wayne (MRN [id number]) as of 02/17/2021 11:38  Ref. Range 11/25/2020 15:17  Prostate Specific Ag, Serum Latest Ref Range:   0.0 - 4.0 ng/mL 9.7 (H)      Impression and Plan:   79-year-old man with:   1.  Castration-resistant advanced prostate cancer with disease to the bone diagnosed in June 2021.     His disease status was discussed and treatment choices were reiterated.  Risks and benefits of Zytiga were discussed.  Complication clinic hypertension, edema, adrenal sufficiency among others were discussed.  At this time, he declined taking Zytiga or any additional medication.  He values good quality of life and does not like to interfere with.  He understands that his disease likely will progress and cause bone pain and possibly need for radiation or chemotherapy or strong pain medication.   He understands these risks and would like to discontinue oral cancer treatment but agreeable to proceed with androgen deprivation therapy alone.    2.   Androgen deprivation therapy: He is currently on Eligard every 3 months.  This will be repeated today.  Complication clinic weight gain, hot flashes among others were reviewed.   3.  Bone directed therapy: I recommended calcium and vitamin D supplements to be continued.  Xgeva will be implemented after dental clearance.     4.  Follow-up: In 3 months for repeat evaluation.     30  minutes were spent on this visit.  The time was dedicated to reviewing laboratory data,  disease status update, treatment choices and future plan of care review.    Firas Shadad, MD 6/21/202211:35 AM  

## 2021-02-18 LAB — PROSTATE-SPECIFIC AG, SERUM (LABCORP): Prostate Specific Ag, Serum: 24.9 ng/mL — ABNORMAL HIGH (ref 0.0–4.0)

## 2021-02-26 ENCOUNTER — Encounter: Payer: Self-pay | Admitting: Cardiology

## 2021-02-26 NOTE — Telephone Encounter (Signed)
Apologies for the delay. We can start rosuvastatin 5 mg daily and recheck his labs when he comes to see me in follow up. If he has any issues or concerns, please ask him to contact me.

## 2021-02-27 ENCOUNTER — Ambulatory Visit (INDEPENDENT_AMBULATORY_CARE_PROVIDER_SITE_OTHER): Payer: Medicare HMO | Admitting: Family Medicine

## 2021-02-27 ENCOUNTER — Encounter: Payer: Self-pay | Admitting: Family Medicine

## 2021-02-27 ENCOUNTER — Other Ambulatory Visit: Payer: Self-pay

## 2021-02-27 VITALS — BP 118/80 | HR 82 | Temp 98.1°F | Resp 19 | Ht 70.0 in | Wt 174.8 lb

## 2021-02-27 DIAGNOSIS — L409 Psoriasis, unspecified: Secondary | ICD-10-CM

## 2021-02-27 DIAGNOSIS — E782 Mixed hyperlipidemia: Secondary | ICD-10-CM

## 2021-02-27 DIAGNOSIS — C7951 Secondary malignant neoplasm of bone: Secondary | ICD-10-CM | POA: Diagnosis not present

## 2021-02-27 DIAGNOSIS — I10 Essential (primary) hypertension: Secondary | ICD-10-CM | POA: Diagnosis not present

## 2021-02-27 DIAGNOSIS — C61 Malignant neoplasm of prostate: Secondary | ICD-10-CM

## 2021-02-27 DIAGNOSIS — E1121 Type 2 diabetes mellitus with diabetic nephropathy: Secondary | ICD-10-CM | POA: Diagnosis not present

## 2021-02-27 LAB — HEPATIC FUNCTION PANEL
ALT: 14 U/L (ref 0–53)
AST: 19 U/L (ref 0–37)
Albumin: 4.3 g/dL (ref 3.5–5.2)
Alkaline Phosphatase: 90 U/L (ref 39–117)
Bilirubin, Direct: 0.1 mg/dL (ref 0.0–0.3)
Total Bilirubin: 0.5 mg/dL (ref 0.2–1.2)
Total Protein: 6.8 g/dL (ref 6.0–8.3)

## 2021-02-27 LAB — LDL CHOLESTEROL, DIRECT: Direct LDL: 145 mg/dL

## 2021-02-27 LAB — BASIC METABOLIC PANEL
BUN: 38 mg/dL — ABNORMAL HIGH (ref 6–23)
CO2: 32 mEq/L (ref 19–32)
Calcium: 10.1 mg/dL (ref 8.4–10.5)
Chloride: 100 mEq/L (ref 96–112)
Creatinine, Ser: 2.04 mg/dL — ABNORMAL HIGH (ref 0.40–1.50)
GFR: 30.2 mL/min — ABNORMAL LOW (ref >60.00)
Glucose, Bld: 108 mg/dL — ABNORMAL HIGH (ref 70–99)
Potassium: 3.7 mEq/L (ref 3.5–5.1)
Sodium: 141 mEq/L (ref 135–145)

## 2021-02-27 LAB — LIPID PANEL
Cholesterol: 237 mg/dL — ABNORMAL HIGH (ref 0–200)
HDL: 45.7 mg/dL (ref >39.00)
NonHDL: 191.78
Total CHOL/HDL Ratio: 5
Triglycerides: 303 mg/dL — ABNORMAL HIGH (ref 0.0–149.0)
VLDL: 60.6 mg/dL — ABNORMAL HIGH (ref 0.0–40.0)

## 2021-02-27 LAB — CBC WITH DIFFERENTIAL/PLATELET
Basophils Absolute: 0 10*3/uL (ref 0.0–0.1)
Basophils Relative: 0.9 % (ref 0.0–3.0)
Eosinophils Absolute: 0.3 10*3/uL (ref 0.0–0.7)
Eosinophils Relative: 6.4 % — ABNORMAL HIGH (ref 0.0–5.0)
HCT: 33.9 % — ABNORMAL LOW (ref 39.0–52.0)
Hemoglobin: 11.7 g/dL — ABNORMAL LOW (ref 13.0–17.0)
Lymphocytes Relative: 21.7 % (ref 12.0–46.0)
Lymphs Abs: 1.1 10*3/uL (ref 0.7–4.0)
MCHC: 34.6 g/dL (ref 30.0–36.0)
MCV: 96.3 fl (ref 78.0–100.0)
Monocytes Absolute: 0.4 10*3/uL (ref 0.1–1.0)
Monocytes Relative: 8.5 % (ref 3.0–12.0)
Neutro Abs: 3.2 10*3/uL (ref 1.4–7.7)
Neutrophils Relative %: 62.5 % (ref 43.0–77.0)
Platelets: 262 10*3/uL (ref 150.0–400.0)
RBC: 3.52 Mil/uL — ABNORMAL LOW (ref 4.22–5.81)
RDW: 14.1 % (ref 11.5–15.5)
WBC: 5.1 10*3/uL (ref 4.0–10.5)

## 2021-02-27 LAB — MICROALBUMIN / CREATININE URINE RATIO
Creatinine,U: 192.5 mg/dL
Microalb Creat Ratio: 2.3 mg/g (ref 0.0–30.0)
Microalb, Ur: 4.5 mg/dL — ABNORMAL HIGH (ref 0.0–1.9)

## 2021-02-27 LAB — TSH: TSH: 1.2 u[IU]/mL (ref 0.35–5.50)

## 2021-02-27 LAB — HEMOGLOBIN A1C: Hgb A1c MFr Bld: 7 % — ABNORMAL HIGH (ref 4.6–6.5)

## 2021-02-27 MED ORDER — TRIAMCINOLONE ACETONIDE 0.1 % EX OINT: 1.0000 "application " | TOPICAL_OINTMENT | Freq: Two times a day (BID) | CUTANEOUS | 1 refills | Status: AC

## 2021-02-27 NOTE — Assessment & Plan Note (Signed)
Chronic problem.  Has been well controlled on Trulicity.  Foot exam done today.  Updated eye exam in chart.  Due for Microalbumin.  Check labs.  Adjust meds prn

## 2021-02-27 NOTE — Assessment & Plan Note (Signed)
Chronic problem.  Last LDL 159 which is >2x goal for someone w/ DM.  Pt reports he has been intolerant to statins in the past.  He has appt upcoming w/ Dr Harrell Gave (Cards) to discuss lipid lowering options.  Will follow along.

## 2021-02-27 NOTE — Patient Instructions (Signed)
Schedule your complete physical in 6 months We'll notify you of your lab results and make any changes if needed START the Triamcinolone ointment twice daily behind both ears and along scalp We'll call you with your Dermatology appt Call with any questions or concerns Stay Safe!  Stay Healthy! Welcome!  We're glad to have you!!

## 2021-02-27 NOTE — Assessment & Plan Note (Signed)
Following w/ Dr Alen Blew.

## 2021-02-27 NOTE — Assessment & Plan Note (Signed)
Chronic problem.  Excellent control on Nifedipine and HCTZ.  Currently asymptomatic.  Check labs.  No anticipated med changes.

## 2021-02-27 NOTE — Progress Notes (Signed)
   Subjective:    Patient ID: Wayne Perkins, male    DOB: 23-Sep-1940, 80 y.o.   MRN: [id number]  HPI DM- chronic problem, on Trulicity 0.75mg  weekly.  Due for urine micro, foot exam.  Last A1C 6.6%  Eye exam 06/12/20  HTN- chronic problem, on Nifedipine 90mg  daily, HCTZ 25mg  daily w/ good control  Hyperlipidemia- last LDL 159.  Pt intolerant to statins.  Has appt upcoming w/ Wayne Perkins to discuss cholesterol medication options  Prostate cancer- following w/ Wayne Perkins.  Never started Zytiga due to side effects.  Ear scales- pt reports scaling behind the ears.  Pt has tried vasoline, antibiotic ointment.  Pt reports that overnight there will be oozing and his hair will be matted.  Not painful.  Review of Systems For ROS see HPI   This visit occurred during the SARS-CoV-2 public health emergency.  Safety protocols were in place, including screening questions prior to the visit, additional usage of staff PPE, and extensive cleaning of exam room while observing appropriate contact time as indicated for disinfecting solutions.      Objective:   Physical Exam Vitals reviewed.  Constitutional:      General: He is not in acute distress.    Appearance: Normal appearance. He is well-developed.  HENT:     Head: Normocephalic and atraumatic.  Neck:     Thyroid: No thyromegaly.  Cardiovascular:     Rate and Rhythm: Normal rate and regular rhythm.     Pulses: Normal pulses.     Heart sounds: Normal heart sounds. No murmur heard. Pulmonary:     Effort: Pulmonary effort is normal. No respiratory distress.     Breath sounds: Normal breath sounds.  Abdominal:     General: Bowel sounds are normal. There is no distension.     Palpations: Abdomen is soft.  Musculoskeletal:     Cervical back: Normal range of motion and neck supple.     Right lower leg: No edema.     Left lower leg: No edema.  Lymphadenopathy:     Cervical: No cervical adenopathy.  Skin:    General: Skin is warm and dry.      Findings: Lesion (silvery plaques along lower hairline, above ears bilaterally) present.     Comments: Cracked, oozing skin behind ears bilaterally  Neurological:     General: No focal deficit present.     Mental Status: He is alert and oriented to person, place, and time.     Cranial Nerves: No cranial nerve deficit.  Psychiatric:        Mood and Affect: Mood normal.        Behavior: Behavior normal.          Assessment & Plan:   Psoriasis- new.  Pt has silvery plaques in his hair above ears bilaterally.  He also has cracked and oozing skin behind each ear.  Will start Triamcinolone ointment and refer to Derm.  Pt expressed understanding and is in agreement w/ plan.

## 2021-03-04 ENCOUNTER — Other Ambulatory Visit: Payer: Self-pay

## 2021-03-04 DIAGNOSIS — N183 Chronic kidney disease, stage 3 unspecified: Secondary | ICD-10-CM

## 2021-03-10 NOTE — Telephone Encounter (Signed)
Left message to call back  

## 2021-03-11 ENCOUNTER — Other Ambulatory Visit (INDEPENDENT_AMBULATORY_CARE_PROVIDER_SITE_OTHER): Payer: Medicare HMO

## 2021-03-11 ENCOUNTER — Other Ambulatory Visit: Payer: Self-pay

## 2021-03-11 DIAGNOSIS — N183 Chronic kidney disease, stage 3 unspecified: Secondary | ICD-10-CM

## 2021-03-11 LAB — BASIC METABOLIC PANEL
BUN: 28 mg/dL — ABNORMAL HIGH (ref 6–23)
CO2: 31 mEq/L (ref 19–32)
Calcium: 9.8 mg/dL (ref 8.4–10.5)
Chloride: 98 mEq/L (ref 96–112)
Creatinine, Ser: 1.58 mg/dL — ABNORMAL HIGH (ref 0.40–1.50)
GFR: 41.03 mL/min — ABNORMAL LOW (ref >60.00)
Glucose, Bld: 125 mg/dL — ABNORMAL HIGH (ref 70–99)
Potassium: 3.4 mEq/L — ABNORMAL LOW (ref 3.5–5.1)
Sodium: 140 mEq/L (ref 135–145)

## 2021-03-11 NOTE — Telephone Encounter (Signed)
Attempted to contact pt x 2. Left message to call back 

## 2021-03-11 NOTE — Telephone Encounter (Signed)
Pt updated with MD's recommendations and voiced he would prefer to not start any new medication until appointment on 04/23/21.   Will forward to MD to make aware

## 2021-03-31 ENCOUNTER — Other Ambulatory Visit: Payer: Self-pay | Admitting: Oncology

## 2021-03-31 ENCOUNTER — Other Ambulatory Visit: Payer: Self-pay | Admitting: Family Medicine

## 2021-03-31 DIAGNOSIS — C61 Malignant neoplasm of prostate: Secondary | ICD-10-CM

## 2021-04-23 ENCOUNTER — Other Ambulatory Visit: Payer: Self-pay

## 2021-04-23 ENCOUNTER — Ambulatory Visit (HOSPITAL_BASED_OUTPATIENT_CLINIC_OR_DEPARTMENT_OTHER): Payer: Medicare HMO | Admitting: Cardiology

## 2021-04-23 ENCOUNTER — Encounter (HOSPITAL_BASED_OUTPATIENT_CLINIC_OR_DEPARTMENT_OTHER): Payer: Self-pay | Admitting: Cardiology

## 2021-04-23 VITALS — BP 140/61 | HR 84 | Ht 70.0 in | Wt 172.0 lb

## 2021-04-23 DIAGNOSIS — I10 Essential (primary) hypertension: Secondary | ICD-10-CM | POA: Diagnosis not present

## 2021-04-23 DIAGNOSIS — E782 Mixed hyperlipidemia: Secondary | ICD-10-CM

## 2021-04-23 DIAGNOSIS — Z8673 Personal history of transient ischemic attack (TIA), and cerebral infarction without residual deficits: Secondary | ICD-10-CM | POA: Diagnosis not present

## 2021-04-23 DIAGNOSIS — I2510 Atherosclerotic heart disease of native coronary artery without angina pectoris: Secondary | ICD-10-CM | POA: Diagnosis not present

## 2021-04-23 DIAGNOSIS — E1121 Type 2 diabetes mellitus with diabetic nephropathy: Secondary | ICD-10-CM

## 2021-04-23 DIAGNOSIS — Z79899 Other long term (current) drug therapy: Secondary | ICD-10-CM

## 2021-04-23 MED ORDER — ROSUVASTATIN CALCIUM 5 MG PO TABS: 5.0000 mg | ORAL_TABLET | Freq: Every day | ORAL | 3 refills | Status: DC

## 2021-04-23 NOTE — Progress Notes (Signed)
Cardiology Office Note:    Date:  04/23/2021   ID:  Stpehen Perkins, DOB 1940-09-16, MRN [id number]  PCP:  [name], MD  Cardiologist:  Buford Dresser, MD  Referring MD: [name], MD   CC: follow up  History of Present Illness:    Wayne Perkins is a 80 y.o. adult with a hx of TIA/CVA, CAD s/p prior stents, hypertension, type II diabetes with nephropathy/CKD, hyperlipidemia, malignant prostate cancer who is seen for follow up today. I initially met him 11/28/20 as a new consult at the request of [name], MD for the evaluation and management of ASCVD and hypertension.  CV history: -cardiologist at [facility] in [place]. Had PCI in 2004 to "the left side" by Dr. Karrie Doffing at [facility] and brings angiogram pictures of his RCA pre/post PCI 2010 by Dr. Valrie Hart at [facility]  -Angina symptom in 2004 was bilateral hand numbness, felt like he was going to pass out. Lost consciousness, was defibrillated by EMS, urgently went to cath lab. In 2010, felt fatigued, couldn't eat. Felt heaviness in his chest. Called EMS, brought to hospital, had cath the day after admission.   Today: Did not start Zytiga for his prostate cancer. We previously discussed trialing rosuvastatin 5 mg, as he does not recall being on this statin previously.  Has chronic leg/hip pain, improved with tylenol. Has rare heartburn, about once/month. This is an ache in the left side of his chest, improves with drinking about a 1/2 glass of milk. No associated symptoms at the time of events, but does have unrelated hot flashes due to anti-hormone therapy. No clear aggravating factors, not exertional. Usually in the evening, sometimes while lying in bed.  Staying active, mows the lawn, cares for the house, works on cars. Helped son carry furniture down stairs last weekend without issue, had to climb many flights of stairs as part of this.  Diabetes is improving, doing very well on trulicity. Discussed SGLT2i and  GLP1RA in terms of CV risk reduction.  Discussed statins at length today, see below. He is most concerned about his chronic leg pain, which we discussed.  Denies shortness of breath at rest or with normal exertion. No PND, orthopnea, LE edema or unexpected weight gain. No syncope or palpitations.   Past Medical History:  Diagnosis Date   Arthritis    Blood in stool    Childhood asthma    Diabetes mellitus without complication ([place])    type II   Heart disease    Hypertension, essential    Prostate cancer ([place])    Stroke [facility])     Past Surgical History:  Procedure Laterality Date   CORONARY ANGIOPLASTY WITH STENT PLACEMENT     GANGLION CYST EXCISION Right    PROSTATE BIOPSY      Current Medications: Current Outpatient Medications on File Prior to Visit  Medication Sig   Alcohol Swabs (B-D SINGLE USE SWABS REGULAR) PADS    Blood Glucose Calibration (ACCU-CHEK AVIVA) SOLN    blood glucose meter kit and supplies Use once a day in the AM for fasting sugars.   Calcium Carb-Cholecalciferol (OYSTER SHELL CALCIUM W/D) 500-200 MG-UNIT TABS TAKE 1 TABLET TWICE DAILY   calcium-vitamin D (OSCAL WITH D) 500-200 MG-UNIT tablet Take 1 tablet by mouth 2 (two) times daily.   Cholecalciferol (VITAMIN D3) 50 MCG (2000 UT) TABS Take by mouth.   clopidogrel (PLAVIX) 75 MG tablet TAKE 1 TABLET EVERY DAY   Dulaglutide (TRULICITY) 5.10 CH/8.5ID SOPN Inject 0.75  mg into the skin once a week.   Ez Smart Blood Glucose Lancets MISC by Does not apply route.   glucose blood test strip Use as instructed   hydrochlorothiazide (HYDRODIURIL) 25 MG tablet TAKE 1 TABLET EVERY DAY   hydrOXYzine (ATARAX/VISTARIL) 10 MG tablet TAKE 1 TABLET BY MOUTH 3 TIMES DAILY AS NEEDED FOR ITCHING.   lactulose (CHRONULAC) 10 GM/15ML solution Take 15 mLs (10 g total) by mouth 2 (two) times daily as needed for moderate constipation or severe constipation.   Multiple Vitamin (MULTI-DAY VITAMINS PO) Take by mouth.   Multiple  Vitamins-Minerals (PRESERVISION AREDS PO) Take by mouth. Soft gels   NIFEdipine (PROCARDIA XL/NIFEDICAL-XL) 90 MG 24 hr tablet TAKE 1 TABLET EVERY DAY   ondansetron (ZOFRAN ODT) 4 MG disintegrating tablet Take 1 tablet (4 mg total) by mouth every 8 (eight) hours as needed for nausea or vomiting.   polyethylene glycol (MIRALAX / GLYCOLAX) 17 g packet Take 17 g by mouth daily.   potassium chloride SA (KLOR-CON) 20 MEQ tablet TAKE 1 TABLET TWICE DAILY   Saw Palmetto 450 MG CAPS Take by mouth.   triamcinolone ointment (KENALOG) 0.1 % Apply 1 application topically 2 (two) times daily.   vitamin B-12 (CYANOCOBALAMIN) 1000 MCG tablet Take 1,000 mcg by mouth daily.   No current facility-administered medications on file prior to visit.     Allergies:   Glipizide and Statins   Social History   Tobacco Use   Smoking status: Former    Types: Cigarettes   Smokeless tobacco: Never   Tobacco comments:    as a Network engineer Use: Never used  Substance Use Topics   Alcohol use: Not Currently   Drug use: Never    Family History: family history includes Depression in his brother; Early death in his brother; Heart disease in his brother; Hyperlipidemia in his mother and sister; Hypertension in his mother; Mental illness in his brother; Stroke in his father. There is no history of Prostate cancer, Breast cancer, Colon cancer, or Pancreatic cancer.  ROS:   Please see the history of present illness.  Additional pertinent ROS otherwise unremarkable.  EKGs/Labs/Other Studies Reviewed:    The following studies were reviewed today: Echo 08/01/19 1. Left ventricular ejection fraction, by visual estimation, is 60 to  65%. The left ventricle has normal function. There is mildly increased  left ventricular hypertrophy.   2. Left ventricular diastolic parameters are consistent with Grade I  diastolic dysfunction (impaired relaxation).   3. Global right ventricle has normal systolic  function.The right  ventricular size is normal. No increase in right ventricular wall  thickness.   4. Left atrial size was normal.   5. Right atrial size was normal.   6. Presence of pericardial fat pad.   7. Mild aortic valve annular calcification.   8. The mitral valve is grossly normal. Mild mitral valve regurgitation.   9. The tricuspid valve is grossly normal. Tricuspid valve regurgitation  is mild.  10. The aortic valve is tricuspid. Aortic valve regurgitation is not  visualized.  11. The pulmonic valve was grossly normal. Pulmonic valve regurgitation is  trivial.  12. The ascending aorta was not well visualized.  13. TR signal is inadequate for assessing pulmonary artery systolic  pressure.  14. The inferior vena cava is normal in size with greater than 50%  respiratory variability, suggesting right atrial pressure of 3 mmHg.   EKG:  EKG is personally reviewed.  The ekg  ordered 12/04/2020 demonstrates sinus rhythm with 1st degree AV block, 82 bpm  Recent Labs: 02/27/2021: ALT 14; Hemoglobin 11.7; Platelets 262.0; TSH 1.20 03/11/2021: BUN 28; Creatinine, Ser 1.58; Potassium 3.4; Sodium 140  Recent Lipid Panel    Component Value Date/Time   CHOL 237 (H) 02/27/2021 1342   TRIG 303.0 (H) 02/27/2021 1342   HDL 45.70 02/27/2021 1342   CHOLHDL 5 02/27/2021 1342   VLDL 60.6 (H) 02/27/2021 1342   LDLCALC 159 (H) 08/15/2020 1123   LDLDIRECT 145.0 02/27/2021 1342    Physical Exam:    VS:  BP 140/61   Pulse 84   Ht '5\' 10"'  (1.778 m)   Wt 172 lb (78 kg)   SpO2 96%   BMI 24.68 kg/m     Wt Readings from Last 3 Encounters:  04/23/21 172 lb (78 kg)  02/27/21 174 lb 12.8 oz (79.3 kg)  02/17/21 174 lb 8 oz (79.2 kg)    GEN: Well nourished, well developed in no acute distress HEENT: Normal, moist mucous membranes NECK: No JVD CARDIAC: regular rhythm, normal S1 and S2, no rubs or gallops. No murmur. VASCULAR: Radial and DP pulses 2+ bilaterally. No carotid bruits RESPIRATORY:   Clear to auscultation without rales, wheezing or rhonchi  ABDOMEN: Soft, non-tender, non-distended MUSCULOSKELETAL:  Ambulates independently SKIN: Warm and dry, no edema NEUROLOGIC:  Alert and oriented x 3. No focal neuro deficits noted. PSYCHIATRIC:  Normal affect    ASSESSMENT:    1. Coronary artery disease involving native coronary artery of native heart without angina pectoris   2. Mixed hyperlipidemia   3. Medication management   4. History of CVA (cerebrovascular accident)   5. Hypertension, essential   6. Type 2 diabetes with nephropathy (HCC)     PLAN:    History of TIA/CVA History of CAD with prior stents Mixed hyperlipidemia Type II diabetes, with nephropathy Hypertension -does not tolerate beta blockers -continue clopidogrel -discussed statins again today. He is amenable, will start with low dose rosuvastatin and monitor. Last LDL 159, goal <70 -TG elevated on prior test, but he was not fasting. Discussed fasting state for recheck -no symptoms currently, counseled on red flag symptoms that need immediate medical attention -continue dulaglutide given diabetes and ASCVD. SGLT2i would be another good option given ASCVD and diabetic nephropathy, could likely discontinue HCTZ with that.  -BP above goal today. Previously stopped lisinopril due to concern of risks. Tolerates nifedipine. Would consider ARB given diabetic nephropathy, he wishes to continue current medications for now  Cardiac risk counseling and prevention recommendations: -recommend heart healthy/Mediterranean diet, with whole grains, fruits, vegetable, fish, lean meats, nuts, and olive oil. Limit salt. -recommend moderate walking, 3-5 times/week for 30-50 minutes each session. Aim for at least 150 minutes.week. Goal should be pace of 3 miles/hours, or walking 1.5 miles in 30 minutes -recommend avoidance of tobacco products. Avoid excess alcohol.  Plan for follow up: 6 mos or sooner as needed  Total time of  encounter: 48 minutes total time of encounter, including 37 minutes spent in face-to-face patient care. This time includes coordination of care and counseling regarding recommendations for medical management. Remainder of non-face-to-face time involved reviewing chart documents/testing relevant to the patient encounter and documentation in the medical record.  Buford Dresser, MD, PhD, [facility]    Medication Adjustments/Labs and Tests Ordered: Current medicines are reviewed at length with the patient today.  Concerns regarding medicines are outlined above.  Orders Placed This Encounter  Procedures  Lipid panel   Hepatic function panel    Meds ordered this encounter  Medications   rosuvastatin (CRESTOR) 5 MG tablet    Sig: Take 1 tablet (5 mg total) by mouth daily.    Dispense:  90 tablet    Refill:  3     Patient Instructions  Medication Instructions:  Start taking Rosuvastatin 5 mg daily  *If you need a refill on your cardiac medications before your next appointment, please call your pharmacy*   Lab Work: Your physician recommends that you return for lab work in October, 2022 (fasting lipid, LFT).  If you have labs (blood work) drawn today and your tests are completely normal, you will receive your results only by: [place] (if you have MyChart) OR A paper copy in the mail If you have any lab test that is abnormal or we need to change your treatment, we will call you to review the results.   Testing/Procedures: None ordered today   Follow-Up: At [facility], you and your health needs are our priority.  As part of our continuing mission to provide you with exceptional heart care, we have created designated Provider Care Teams.  These Care Teams include your primary Cardiologist (physician) and Advanced Practice Providers (APPs -  Physician Assistants and Nurse Practitioners) who all work together to provide you with the care you  need, when you need it.  We recommend signing up for the patient portal called "MyChart".  Sign up information is provided on this After Visit Summary.  MyChart is used to connect with patients for Virtual Visits (Telemedicine).  Patients are able to view lab/test results, encounter notes, upcoming appointments, etc.  Non-urgent messages can be sent to your provider as well.   To learn more about what you can do with MyChart, go to [email or web address].    Your next appointment:   6 month(s)  The format for your next appointment:   In Person  Provider:   Buford Dresser, MD   Signed, Buford Dresser, MD PhD 04/23/2021 1:34 PM    [place]

## 2021-04-23 NOTE — Patient Instructions (Signed)
Medication Instructions:  Start taking Rosuvastatin 5 mg daily  *If you need a refill on your cardiac medications before your next appointment, please call your pharmacy*   Lab Work: Your physician recommends that you return for lab work in October, 2022 (fasting lipid, LFT).  If you have labs (blood work) drawn today and your tests are completely normal, you will receive your results only by: [place] (if you have MyChart) OR A paper copy in the mail If you have any lab test that is abnormal or we need to change your treatment, we will call you to review the results.   Testing/Procedures: None ordered today   Follow-Up: At [facility], you and your health needs are our priority.  As part of our continuing mission to provide you with exceptional heart care, we have created designated Provider Care Teams.  These Care Teams include your primary Cardiologist (physician) and Advanced Practice Providers (APPs -  Physician Assistants and Nurse Practitioners) who all work together to provide you with the care you need, when you need it.  We recommend signing up for the patient portal called "MyChart".  Sign up information is provided on this After Visit Summary.  MyChart is used to connect with patients for Virtual Visits (Telemedicine).  Patients are able to view lab/test results, encounter notes, upcoming appointments, etc.  Non-urgent messages can be sent to your provider as well.   To learn more about what you can do with MyChart, go to [email or web address].    Your next appointment:   6 month(s)  The format for your next appointment:   In Person  Provider:   Buford Dresser, MD

## 2021-04-29 ENCOUNTER — Telehealth: Payer: Self-pay | Admitting: Cardiology

## 2021-04-29 NOTE — Telephone Encounter (Signed)
*  STAT* If patient is at the pharmacy, call can be transferred to refill team.   1. Which medications need to be refilled? (please list name of each medication and dose if known)  rosuvastatin (CRESTOR) 5 MG tablet  2. Which pharmacy/location (including street and city if local pharmacy) is medication to be sent to? [facility] ([facility] - [place], [place]  3. Do they need a 30 day or 90 day supply? 90 day supply   Pharmacy called pt stating they have been trying to contact our office stating a new prescription is needing to be sent.

## 2021-05-20 ENCOUNTER — Other Ambulatory Visit: Payer: Self-pay

## 2021-05-20 ENCOUNTER — Inpatient Hospital Stay: Payer: Medicare HMO | Admitting: Oncology

## 2021-05-20 ENCOUNTER — Inpatient Hospital Stay: Payer: Medicare HMO

## 2021-05-20 ENCOUNTER — Inpatient Hospital Stay: Payer: Medicare HMO | Attending: Oncology

## 2021-05-20 VITALS — BP 141/92 | HR 69 | Temp 98.3°F | Resp 18 | Ht 70.0 in | Wt 172.5 lb

## 2021-05-20 DIAGNOSIS — C7951 Secondary malignant neoplasm of bone: Secondary | ICD-10-CM | POA: Insufficient documentation

## 2021-05-20 DIAGNOSIS — C61 Malignant neoplasm of prostate: Secondary | ICD-10-CM

## 2021-05-20 DIAGNOSIS — R634 Abnormal weight loss: Secondary | ICD-10-CM | POA: Diagnosis not present

## 2021-05-20 DIAGNOSIS — R5383 Other fatigue: Secondary | ICD-10-CM | POA: Insufficient documentation

## 2021-05-20 LAB — CBC WITH DIFFERENTIAL (CANCER CENTER ONLY)
Abs Immature Granulocytes: 0.02 10*3/uL (ref 0.00–0.07)
Basophils Absolute: 0.1 10*3/uL (ref 0.0–0.1)
Basophils Relative: 1 %
Eosinophils Absolute: 0.4 10*3/uL (ref 0.0–0.5)
Eosinophils Relative: 7 %
HCT: 33.5 % — ABNORMAL LOW (ref 39.0–52.0)
Hemoglobin: 11.4 g/dL — ABNORMAL LOW (ref 13.0–17.0)
Immature Granulocytes: 0 %
Lymphocytes Relative: 18 %
Lymphs Abs: 1 10*3/uL (ref 0.7–4.0)
MCH: 32.6 pg (ref 26.0–34.0)
MCHC: 34 g/dL (ref 30.0–36.0)
MCV: 95.7 fL (ref 80.0–100.0)
Monocytes Absolute: 0.5 10*3/uL (ref 0.1–1.0)
Monocytes Relative: 9 %
Neutro Abs: 3.5 10*3/uL (ref 1.7–7.7)
Neutrophils Relative %: 65 %
Platelet Count: 252 10*3/uL (ref 150–400)
RBC: 3.5 MIL/uL — ABNORMAL LOW (ref 4.22–5.81)
RDW: 13.8 % (ref 11.5–15.5)
WBC Count: 5.5 10*3/uL (ref 4.0–10.5)
nRBC: 0 % (ref 0.0–0.2)

## 2021-05-20 LAB — CMP (CANCER CENTER ONLY)
ALT: 12 U/L (ref 0–44)
AST: 23 U/L (ref 15–41)
Albumin: 4 g/dL (ref 3.5–5.0)
Alkaline Phosphatase: 119 U/L (ref 38–126)
Anion gap: 12 (ref 5–15)
BUN: 38 mg/dL — ABNORMAL HIGH (ref 8–23)
CO2: 28 mmol/L (ref 22–32)
Calcium: 10.1 mg/dL (ref 8.9–10.3)
Chloride: 102 mmol/L (ref 98–111)
Creatinine: 1.81 mg/dL — ABNORMAL HIGH (ref 0.61–1.24)
GFR, Estimated: 37 mL/min — ABNORMAL LOW (ref >60)
Glucose, Bld: 145 mg/dL — ABNORMAL HIGH (ref 70–99)
Potassium: 3.3 mmol/L — ABNORMAL LOW (ref 3.5–5.1)
Sodium: 142 mmol/L (ref 135–145)
Total Bilirubin: 0.5 mg/dL (ref 0.3–1.2)
Total Protein: 7.3 g/dL (ref 6.5–8.1)

## 2021-05-20 MED ORDER — LEUPROLIDE ACETATE (3 MONTH) 22.5 MG ~~LOC~~ KIT
22.5000 mg | PACK | Freq: Once | SUBCUTANEOUS | Status: AC
  Administered 2021-05-20: 22.5 mg via SUBCUTANEOUS
  Filled 2021-05-20: qty 22.5

## 2021-05-20 NOTE — Progress Notes (Signed)
Hematology and Oncology Follow Up Visit  Wayne Perkins [id number] 28-Oct-1940 80 y.o. 05/20/2021 9:43 AM [name], MDWolfe, [name], MD   Principle Diagnosis: 80 year old man with advanced prostate cancer with disease to the bone diagnosed in June 2021.  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    His PSA continues to rise subsequently and was started on androgen deprivation therapy for biochemically recurrent disease.  His PSA decreased down to 0.4 in November 2018.  Due to hot flashes and increased toxicities he was receiving it intermittently and was off androgen deprivation therapy in 2019 with a PSA rise up to 10.9.      He did receive another 6 months of androgen deprivation injection under the care of Dr. Alinda Money and received ADT at that time after his PSA was up to 24.7.  January 2021 is PSA was 4.27 and stopped ADT.    He started developing hematuria and subsequently had a CT scan hematuria protocol in June 2021.  Small sclerotic lesions noted throughout the visualized widespread metastatic disease.  His PSA at that time was 5.68 in April 2021.  Bone scan at that time did not show any evidence of metastatic disease except for a new midthoracic focal area of increased activity.   Xtandi 160 mg daily started in June 2021.  Therapy discontinued because of poor tolerance after 3 weeks.  Zytiga 500 mg daily with prednisone 5 mg daily started in May 2022.  Therapy discontinued in June 2022.  Current therapy:  Eligard 22.5 mg every 3 months restarted on February 29, 2020.  He will receive Eligard today and repeated every 3 months.     Interim History: [name] returns today for repeat evaluation.  Since last visit, he reports  no major changes in his health.  He denies any worsening bone pain or pathological fractures.  He does report lower extremity discomfort that is manageable with Tylenol.  He denies any nausea, vomiting or abdominal pain.  Denies any weight loss or appetite changes.  He is enjoying reasonable quality of life.    Medications: Reviewed without changes. Current Outpatient Medications  Medication Sig Dispense Refill   Alcohol Swabs (B-D SINGLE USE SWABS REGULAR) PADS      Blood Glucose Calibration (ACCU-CHEK AVIVA) SOLN      blood glucose meter kit and supplies Use once a day in the AM for fasting sugars. 1 each 0   Calcium Carb-Cholecalciferol (OYSTER SHELL CALCIUM W/D) 500-200 MG-UNIT TABS TAKE 1 TABLET TWICE DAILY 180 tablet 3   calcium-vitamin D (OSCAL WITH D) 500-200 MG-UNIT tablet Take 1 tablet by mouth 2 (two) times daily. 60 tablet 3   Cholecalciferol (VITAMIN D3) 50 MCG (2000 UT) TABS Take by mouth.     clopidogrel (PLAVIX) 75 MG tablet TAKE 1 TABLET EVERY DAY 90 tablet 1   Dulaglutide (TRULICITY) 6.21 HY/8.6VH SOPN Inject 0.75 mg into the skin once a week. 4 pen 0   Ez Smart Blood Glucose Lancets MISC by Does not apply route. 100 each 3   glucose blood test strip Use as instructed 100 each 3   hydrochlorothiazide (HYDRODIURIL) 25 MG tablet TAKE 1 TABLET EVERY DAY 90 tablet 3   hydrOXYzine (ATARAX/VISTARIL) 10 MG tablet  TAKE 1 TABLET BY MOUTH 3 TIMES DAILY AS NEEDED FOR ITCHING. 270 tablet 1   lactulose (CHRONULAC) 10 GM/15ML solution Take 15 mLs (10 g total) by mouth 2 (two) times daily as needed for moderate constipation or severe constipation. 236 mL 0   Multiple Vitamin (MULTI-DAY VITAMINS PO) Take by mouth.     Multiple Vitamins-Minerals (PRESERVISION AREDS PO) Take by mouth. Soft gels     NIFEdipine (PROCARDIA XL/NIFEDICAL-XL) 90 MG 24 hr tablet TAKE 1 TABLET EVERY DAY 90 tablet 1   ondansetron (ZOFRAN ODT) 4 MG disintegrating tablet Take 1 tablet (4 mg total) by mouth every 8  (eight) hours as needed for nausea or vomiting. 20 tablet 0   polyethylene glycol (MIRALAX / GLYCOLAX) 17 g packet Take 17 g by mouth daily.     potassium chloride SA (KLOR-CON) 20 MEQ tablet TAKE 1 TABLET TWICE DAILY 180 tablet 1   rosuvastatin (CRESTOR) 5 MG tablet Take 1 tablet (5 mg total) by mouth daily. 90 tablet 3   Saw Palmetto 450 MG CAPS Take by mouth.     triamcinolone ointment (KENALOG) 0.1 % Apply 1 application topically 2 (two) times daily. 90 g 1   vitamin B-12 (CYANOCOBALAMIN) 1000 MCG tablet Take 1,000 mcg by mouth daily.     No current facility-administered medications for this visit.     Allergies:  Allergies  Allergen Reactions   Glipizide Itching   Statins Other (See Comments)    Causes narcolepsy      Physical Exam:    Blood pressure (!) 141/92, pulse 69, temperature 98.3 F (36.8 C), temperature source Oral, resp. rate 18, height '5\' 10"'  (1.778 m), weight 172 lb 8 oz (78.2 kg), SpO2 100 %.    ECOG: 1   General appearance: Alert, awake without any distress. Head: Atraumatic without abnormalities Oropharynx: Without any thrush or ulcers. Eyes: No scleral icterus. Lymph nodes: No lymphadenopathy noted in the cervical, supraclavicular, or axillary nodes Heart:regular rate and rhythm, without any murmurs or gallops.   Lung: Clear to auscultation without any rhonchi, wheezes or dullness to percussion. Abdomin: Soft, nontender without any shifting dullness or ascites. Musculoskeletal: No clubbing or cyanosis. Neurological: No motor or sensory deficits. Skin: No rashes or lesions.         Lab Results: Lab Results  Component Value Date   WBC 5.5 05/20/2021   HGB 11.4 (L) 05/20/2021   HCT 33.5 (L) 05/20/2021   MCV 95.7 05/20/2021   PLT 252 05/20/2021     Chemistry      Component Value Date/Time   NA 140 03/11/2021 1603   K 3.4 (L) 03/11/2021 1603   CL 98 03/11/2021 1603   CO2 31 03/11/2021 1603   BUN 28 (H) 03/11/2021 1603   CREATININE  1.58 (H) 03/11/2021 1603   CREATININE 1.60 (H) 02/17/2021 1137   CREATININE 1.66 (H) 08/15/2020 1123      Component Value Date/Time   CALCIUM 9.8 03/11/2021 1603   ALKPHOS 90 02/27/2021 1342   AST 19 02/27/2021 1342   AST 22 02/17/2021 1137   ALT 14 02/27/2021 1342   ALT 15 02/17/2021 1137   BILITOT 0.5 02/27/2021 1342   BILITOT 0.7 02/17/2021 1137          Results for TENOCH, MCCLURE (MRN [id number]) as of 05/20/2021 09:45  Ref. Range 11/25/2020 15:17 02/17/2021 11:37  Prostate Specific Ag, Serum Latest Ref Range: 0.0 - 4.0 ng/mL 9.7 (H) 24.9 (H)     Impression and Plan:  80 year old man with:   1.  Advanced prostate cancer with disease to the bone diagnosed in June 2021.  He has castration-resistant disease at this time.  He has declined additional therapy to androgen deprivation.  He has tolerated Zytiga and Xtandi poorly and continues to decline systemic chemotherapy.  The natural course of this disease was reviewed today in detail and the fact that his PSA is rising indicating progression of disease will likely symptomatic disease.  He has symptoms including weight loss, fatigue and bone pain.  Chemotherapy can offer palliation and possibly extend his life 3 to 5 months.  After discussion today, he still values quality of life and prefers not to have any other treatment.  He understands his disease will progress and likely cause possible bone pain and suffering.  I assured him that radiation therapy as well as pain medication can help palliate the symptoms.  He is agreeable with this plan and opted against no chemotherapy or additional cancer treatment.    2.   Androgen deprivation therapy: Risks and benefits of continuing Eligard was discussed at this time and he is agreeable to continue.   3.  Bone directed therapy: He will continue calcium and vitamin D supplements.  Delton See option has been deferred.  4.  Prognosis and goals of care: He understands he has an incurable  malignancy that is progressing and likely will require hospice care if he has further decline.     5.  Follow-up: In 3 months for a follow-up visit.     30  minutes were dedicated to this encounter.  Time spent on reviewing laboratory data, disease status update, treatment choices and future plan of care reviewed.    Zola Button, MD 9/21/20229:43 AM

## 2021-05-21 LAB — PROSTATE-SPECIFIC AG, SERUM (LABCORP): Prostate Specific Ag, Serum: 51.9 ng/mL — ABNORMAL HIGH (ref 0.0–4.0)

## 2021-07-03 ENCOUNTER — Telehealth (HOSPITAL_BASED_OUTPATIENT_CLINIC_OR_DEPARTMENT_OTHER): Payer: Self-pay | Admitting: Cardiology

## 2021-07-03 NOTE — Telephone Encounter (Signed)
Pt c/o of Chest Pain: STAT if CP now or developed within 24 hours  1. Are you having CP right now? Feels lie it is heartburn- it happen most at night- not at this time  2. Are you experiencing any other symptoms (ex. SOB, nausea, vomiting, sweating)? np  3. How long have you been experiencing CP? Off and on for the last 6 months- gotten worse in the last month  4. Is your CP continuous or coming and going? Comes and goes  5. Have you taken Nitroglycerin? No- usually drink a glass of milk ?

## 2021-07-03 NOTE — Telephone Encounter (Signed)
Left message to call back  

## 2021-07-07 NOTE — Telephone Encounter (Signed)
Left message for patient to return the call.

## 2021-08-03 ENCOUNTER — Other Ambulatory Visit: Payer: Self-pay

## 2021-08-03 ENCOUNTER — Telehealth: Payer: Self-pay | Admitting: Family Medicine

## 2021-08-03 MED ORDER — HYDROXYZINE HCL 10 MG PO TABS: ORAL_TABLET | ORAL | 1 refills | Status: DC

## 2021-08-03 NOTE — Telephone Encounter (Signed)
.  Caller name:  Price [place]  Caller callback #:  [phone]  Encourage patient to contact the pharmacy for refills or they can request refills through [facility]  (Please schedule appointment if patient has not been seen in over a year)  MEDICATION NAME & DOSE: hydroxizine 10 mg  Notes/Comments from patient:  Patient   WHAT [place] [place] [place]  Please notify patient: It takes 48-72 hours to process rx refill requests Ask patient to call pharmacy to ensure rx is ready before heading there.   (CLINICAL TO FILL OR ROUTE PER PROTOCOLS)

## 2021-08-03 NOTE — Telephone Encounter (Signed)
Rx sent 

## 2021-08-18 ENCOUNTER — Other Ambulatory Visit: Payer: Self-pay

## 2021-08-18 MED ORDER — CLOPIDOGREL BISULFATE 75 MG PO TABS: 75.0000 mg | ORAL_TABLET | Freq: Every day | ORAL | 1 refills | Status: DC

## 2021-08-20 ENCOUNTER — Inpatient Hospital Stay: Payer: Medicare HMO | Attending: Oncology

## 2021-08-20 ENCOUNTER — Other Ambulatory Visit: Payer: Self-pay

## 2021-08-20 ENCOUNTER — Inpatient Hospital Stay: Payer: Medicare HMO

## 2021-08-20 ENCOUNTER — Inpatient Hospital Stay: Payer: Medicare HMO | Admitting: Oncology

## 2021-08-20 VITALS — BP 138/82 | HR 63 | Temp 97.9°F | Resp 17 | Ht 70.0 in | Wt 171.6 lb

## 2021-08-20 DIAGNOSIS — Z79899 Other long term (current) drug therapy: Secondary | ICD-10-CM | POA: Insufficient documentation

## 2021-08-20 DIAGNOSIS — C61 Malignant neoplasm of prostate: Secondary | ICD-10-CM | POA: Insufficient documentation

## 2021-08-20 DIAGNOSIS — C7951 Secondary malignant neoplasm of bone: Secondary | ICD-10-CM | POA: Insufficient documentation

## 2021-08-20 DIAGNOSIS — R232 Flushing: Secondary | ICD-10-CM | POA: Diagnosis not present

## 2021-08-20 DIAGNOSIS — Z888 Allergy status to other drugs, medicaments and biological substances status: Secondary | ICD-10-CM | POA: Insufficient documentation

## 2021-08-20 DIAGNOSIS — R319 Hematuria, unspecified: Secondary | ICD-10-CM | POA: Insufficient documentation

## 2021-08-20 DIAGNOSIS — Z5111 Encounter for antineoplastic chemotherapy: Secondary | ICD-10-CM | POA: Diagnosis not present

## 2021-08-20 LAB — CBC WITH DIFFERENTIAL (CANCER CENTER ONLY)
Abs Immature Granulocytes: 0.03 10*3/uL (ref 0.00–0.07)
Basophils Absolute: 0 10*3/uL (ref 0.0–0.1)
Basophils Relative: 1 %
Eosinophils Absolute: 0.2 10*3/uL (ref 0.0–0.5)
Eosinophils Relative: 4 %
HCT: 31.3 % — ABNORMAL LOW (ref 39.0–52.0)
Hemoglobin: 10.8 g/dL — ABNORMAL LOW (ref 13.0–17.0)
Immature Granulocytes: 1 %
Lymphocytes Relative: 15 %
Lymphs Abs: 0.6 10*3/uL — ABNORMAL LOW (ref 0.7–4.0)
MCH: 32.6 pg (ref 26.0–34.0)
MCHC: 34.5 g/dL (ref 30.0–36.0)
MCV: 94.6 fL (ref 80.0–100.0)
Monocytes Absolute: 0.8 10*3/uL (ref 0.1–1.0)
Monocytes Relative: 19 %
Neutro Abs: 2.3 10*3/uL (ref 1.7–7.7)
Neutrophils Relative %: 60 %
Platelet Count: 241 10*3/uL (ref 150–400)
RBC: 3.31 MIL/uL — ABNORMAL LOW (ref 4.22–5.81)
RDW: 13.2 % (ref 11.5–15.5)
WBC Count: 3.9 10*3/uL — ABNORMAL LOW (ref 4.0–10.5)
nRBC: 0 % (ref 0.0–0.2)

## 2021-08-20 LAB — CMP (CANCER CENTER ONLY)
ALT: 15 U/L (ref 0–44)
AST: 23 U/L (ref 15–41)
Albumin: 4.1 g/dL (ref 3.5–5.0)
Alkaline Phosphatase: 158 U/L — ABNORMAL HIGH (ref 38–126)
Anion gap: 11 (ref 5–15)
BUN: 30 mg/dL — ABNORMAL HIGH (ref 8–23)
CO2: 27 mmol/L (ref 22–32)
Calcium: 9.6 mg/dL (ref 8.9–10.3)
Chloride: 100 mmol/L (ref 98–111)
Creatinine: 1.56 mg/dL — ABNORMAL HIGH (ref 0.61–1.24)
GFR, Estimated: 44 mL/min — ABNORMAL LOW (ref >60)
Glucose, Bld: 178 mg/dL — ABNORMAL HIGH (ref 70–99)
Potassium: 3.4 mmol/L — ABNORMAL LOW (ref 3.5–5.1)
Sodium: 138 mmol/L (ref 135–145)
Total Bilirubin: 0.4 mg/dL (ref 0.3–1.2)
Total Protein: 7 g/dL (ref 6.5–8.1)

## 2021-08-20 MED ORDER — LEUPROLIDE ACETATE (3 MONTH) 22.5 MG ~~LOC~~ KIT
22.5000 mg | PACK | Freq: Once | SUBCUTANEOUS | Status: AC
  Administered 2021-08-20: 10:00:00 22.5 mg via SUBCUTANEOUS
  Filled 2021-08-20: qty 22.5

## 2021-08-20 NOTE — Progress Notes (Signed)
Hematology and Oncology Follow Up Visit  Wayne Perkins [id number] November 11, 1940 80 y.o. 08/20/2021 9:18 AM Wayne Perkins, MDTabori, [name], MD   Principle Diagnosis: 80 year old man with castration-resistant advanced prostate cancer with disease to the bone diagnosed in June 2021.  He presented with localized disease, Gleason score of 6 and a PSA of 6   Prior Therapy:  He was on active surveillance for a period of time and subsequently had a rise in his PSA that necessitated definitive treatment and received external beam radiation as a definitive treatment.    His PSA continues to rise subsequently and was started on androgen deprivation therapy for biochemically recurrent disease.  His PSA decreased down to 0.4 in November 2018.  Due to hot flashes and increased toxicities he was receiving it intermittently and was off androgen deprivation therapy in 2019 with a PSA rise up to 10.9.      He did receive another 6 months of androgen deprivation injection under the care of Dr. Alinda Money and received ADT at that time after his PSA was up to 24.7.  January 2021 is PSA was 4.27 and stopped ADT.    He started developing hematuria and subsequently had a CT scan hematuria protocol in June 2021.  Small sclerotic lesions noted throughout the visualized widespread metastatic disease.  His PSA at that time was 5.68 in April 2021.  Bone scan at that time did not show any evidence of metastatic disease except for a new midthoracic focal area of increased activity.   Xtandi 160 mg daily started in June 2021.  Therapy discontinued because of poor tolerance after 3 weeks.  Zytiga 500 mg daily with prednisone 5 mg daily started in May 2022.  Therapy discontinued in June 2022.  Current therapy:  Eligard 22.5 mg every 3 months restarted on February 29, 2020.  This will be received today and repeated     Interim History: [name] returns today for a follow-up visit.  Since the last visit, he reports no major  changes in his health.  He has tolerated Eligard without any complaints.  He denies any nausea, fatigue or worsening bone pain.  He does report occasional lower extremity discomfort that is manageable with Tylenol.  He denies any bone pain or hip pain.  He denies any hospitalizations or illnesses.    Medications: Updated on review. Current Outpatient Medications  Medication Sig Dispense Refill   Alcohol Swabs (B-D SINGLE USE SWABS REGULAR) PADS      Blood Glucose Calibration (ACCU-CHEK AVIVA) SOLN      blood glucose meter kit and supplies Use once a day in the AM for fasting sugars. 1 each 0   Calcium Carb-Cholecalciferol (OYSTER SHELL CALCIUM W/D) 500-200 MG-UNIT TABS TAKE 1 TABLET TWICE DAILY 180 tablet 3   calcium-vitamin D (OSCAL WITH D) 500-200 MG-UNIT tablet Take 1 tablet by mouth 2 (two) times daily. 60 tablet 3   Cholecalciferol (VITAMIN D3) 50 MCG (2000 UT) TABS Take by mouth.     clopidogrel (PLAVIX) 75 MG tablet Take 1 tablet (75 mg total) by mouth daily. 90 tablet 1   Dulaglutide (TRULICITY) 9.51 OA/4.1YS SOPN Inject 0.75 mg into the skin once a week. 4 pen 0   Ez Smart Blood Glucose Lancets MISC by Does not apply route. 100 each 3   glucose blood test strip Use as instructed 100 each 3   hydrochlorothiazide (HYDRODIURIL) 25 MG tablet TAKE 1 TABLET EVERY DAY 90 tablet 3   hydrOXYzine (ATARAX) 10  MG tablet TAKE 1 TABLET BY MOUTH 3 TIMES DAILY AS NEEDED FOR ITCHING. 270 tablet 1   lactulose (CHRONULAC) 10 GM/15ML solution Take 15 mLs (10 g total) by mouth 2 (two) times daily as needed for moderate constipation or severe constipation. 236 mL 0   Multiple Vitamin (MULTI-DAY VITAMINS PO) Take by mouth.     Multiple Vitamins-Minerals (PRESERVISION AREDS PO) Take by mouth. Soft gels     NIFEdipine (PROCARDIA XL/NIFEDICAL-XL) 90 MG 24 hr tablet TAKE 1 TABLET EVERY DAY 90 tablet 1   ondansetron (ZOFRAN ODT) 4 MG disintegrating tablet Take 1 tablet (4 mg total) by mouth every 8 (eight) hours  as needed for nausea or vomiting. 20 tablet 0   polyethylene glycol (MIRALAX / GLYCOLAX) 17 g packet Take 17 g by mouth daily.     potassium chloride SA (KLOR-CON) 20 MEQ tablet TAKE 1 TABLET TWICE DAILY 180 tablet 1   rosuvastatin (CRESTOR) 5 MG tablet Take 1 tablet (5 mg total) by mouth daily. 90 tablet 3   Saw Palmetto 450 MG CAPS Take by mouth.     triamcinolone ointment (KENALOG) 0.1 % Apply 1 application topically 2 (two) times daily. 90 g 1   vitamin B-12 (CYANOCOBALAMIN) 1000 MCG tablet Take 1,000 mcg by mouth daily.     No current facility-administered medications for this visit.     Allergies:  Allergies  Allergen Reactions   Glipizide Itching   Statins Other (See Comments)    Causes narcolepsy      Physical Exam:     Blood pressure 138/82, pulse 63, temperature 97.9 F (36.6 C), temperature source Temporal, resp. rate 17, height '5\' 10"'  (1.778 m), weight 171 lb 9.6 oz (77.8 kg), SpO2 100 %.    ECOG: 1    General appearance: Comfortable appearing without any discomfort Head: Normocephalic without any trauma Oropharynx: Mucous membranes are moist and pink without any thrush or ulcers. Eyes: Pupils are equal and round reactive to light. Lymph nodes: No cervical, supraclavicular, inguinal or axillary lymphadenopathy.   Heart:regular rate and rhythm.  S1 and S2 without leg edema. Lung: Clear without any rhonchi or wheezes.  No dullness to percussion. Abdomin: Soft, nontender, nondistended with good bowel sounds.  No hepatosplenomegaly. Musculoskeletal: No joint deformity or effusion.  Full range of motion noted. Neurological: No deficits noted on motor, sensory and deep tendon reflex exam. Skin: No petechial rash or dryness.  Appeared moist.          Lab Results: Lab Results  Component Value Date   WBC 3.9 (L) 08/20/2021   HGB 10.8 (L) 08/20/2021   HCT 31.3 (L) 08/20/2021   MCV 94.6 08/20/2021   PLT 241 08/20/2021     Chemistry      Component  Value Date/Time   NA 142 05/20/2021 0927   K 3.3 (L) 05/20/2021 0927   CL 102 05/20/2021 0927   CO2 28 05/20/2021 0927   BUN 38 (H) 05/20/2021 0927   CREATININE 1.81 (H) 05/20/2021 0927   CREATININE 1.66 (H) 08/15/2020 1123      Component Value Date/Time   CALCIUM 10.1 05/20/2021 0927   ALKPHOS 119 05/20/2021 0927   AST 23 05/20/2021 0927   ALT 12 05/20/2021 0927   BILITOT 0.5 05/20/2021 0927        Latest Reference Range & Units 05/20/21 09:27  Prostate Specific Ag, Serum 0.0 - 4.0 ng/mL 51.9 (H)  (H): Data is abnormally high     Impression and Plan:  80 year old man with:   1.  Castration-resistant advanced prostate cancer with disease to the bone diagnosed in June 2021.   His disease status was updated at this time including review of his PSA.  He continues to experience a rise in his PSA likely reflecting disease progression.  Treatment choices were discussed at this time including abiraterone, enzalutamide, chemotherapy among others.  For the time being he continues to defer that option and prefers quality of life at this time.  He understands that this cancer can cause problems for him in the future including bone pain or pathological fractures.  For the time being he opted against any additional treatment.    2.   Androgen deprivation therapy: He will receive Eligard today and repeated in 3 months.   3.  Bone directed therapy: I recommended continuing calcium and vitamin D supplements for the time being.  4.  Prognosis and goals of care: Therapy remains palliative and he has deferred aggressive measures at this time.  Formal status remained reasonable.      5.  Follow-up: He will return in 3 months for a follow-up evaluation.     30  minutes were spent on this visit.  The time was dedicated to reviewing disease status update, treatment choices and complications related to cancer and cancer therapy.      Zola Button, MD 12/22/20229:18 AM

## 2021-08-21 ENCOUNTER — Telehealth: Payer: Self-pay | Admitting: *Deleted

## 2021-08-21 LAB — PROSTATE-SPECIFIC AG, SERUM (LABCORP): Prostate Specific Ag, Serum: 98.9 ng/mL — ABNORMAL HIGH (ref 0.0–4.0)

## 2021-08-21 NOTE — Telephone Encounter (Signed)
Per Dr.Shadad, called pt to make aware of increased PSA, pt verbalized understanding.

## 2021-09-23 ENCOUNTER — Other Ambulatory Visit: Payer: Self-pay | Admitting: Dermatology

## 2021-09-23 ENCOUNTER — Ambulatory Visit: Payer: Medicare HMO | Admitting: Dermatology

## 2021-09-23 ENCOUNTER — Other Ambulatory Visit: Payer: Self-pay

## 2021-09-23 ENCOUNTER — Encounter: Payer: Self-pay | Admitting: Dermatology

## 2021-09-23 DIAGNOSIS — L409 Psoriasis, unspecified: Secondary | ICD-10-CM

## 2021-09-23 DIAGNOSIS — L720 Epidermal cyst: Secondary | ICD-10-CM | POA: Diagnosis not present

## 2021-09-23 DIAGNOSIS — L821 Other seborrheic keratosis: Secondary | ICD-10-CM | POA: Diagnosis not present

## 2021-09-23 DIAGNOSIS — Z1283 Encounter for screening for malignant neoplasm of skin: Secondary | ICD-10-CM | POA: Diagnosis not present

## 2021-09-23 DIAGNOSIS — D1801 Hemangioma of skin and subcutaneous tissue: Secondary | ICD-10-CM | POA: Diagnosis not present

## 2021-09-23 MED ORDER — CLOBETASOL PROPIONATE 0.05 % EX FOAM: Freq: Two times a day (BID) | CUTANEOUS | 2 refills | Status: DC

## 2021-09-23 NOTE — Patient Instructions (Addendum)
If rose water and aloe treatment stops working, pick up Clobetasol foam and apply to the arms and scalp  PICK UP OVER THE COUNTER CERAVE PRAMOXINE

## 2021-09-29 DIAGNOSIS — R338 Other retention of urine: Secondary | ICD-10-CM | POA: Diagnosis not present

## 2021-09-29 DIAGNOSIS — C61 Malignant neoplasm of prostate: Secondary | ICD-10-CM | POA: Diagnosis not present

## 2021-10-03 ENCOUNTER — Other Ambulatory Visit: Payer: Self-pay | Admitting: Family Medicine

## 2021-10-08 ENCOUNTER — Other Ambulatory Visit: Payer: Self-pay

## 2021-10-08 MED ORDER — HYDROCHLOROTHIAZIDE 25 MG PO TABS: 25.0000 mg | ORAL_TABLET | Freq: Every day | ORAL | 0 refills | Status: DC

## 2021-10-12 ENCOUNTER — Encounter: Payer: Self-pay | Admitting: Dermatology

## 2021-10-12 ENCOUNTER — Telehealth: Payer: Self-pay

## 2021-10-12 NOTE — Telephone Encounter (Signed)
Pt is aware that it was sent and the pharmacy confirmed they did receive it and it had been sent out for delivery

## 2021-10-12 NOTE — Progress Notes (Signed)
° °  New Patient   Subjective  Wayne Perkins is a 81 y.o. adult who presents for the following: Annual Exam (Lesions on both arms x year- itching no bleeding. Lesion on left hand x 4 months. ).  General skin examination, check spots on arms Location:  Duration:  Quality:  Associated Signs/Symptoms: Modifying Factors:  Severity:  Timing: Context:    The following portions of the chart were reviewed this encounter and updated as appropriate:  Tobacco   Allergies   Meds   Problems   Med Hx   Surg Hx   Fam Hx       Objective  Well appearing patient in no apparent distress; mood and affect are within normal limits. Multiple light brown ovoid 4 to 8 mm flattopped textured papules  Scalp General skin examination: No atypical pigmented lesions or nonmelanoma skin cancer  Abdomen (Lower Torso, Anterior), Chest (Upper Torso, Anterior) Multiple 1 mm smooth red dermal papules  Left Buccal Cheek 5 mm deep dermal noninflamed white papule  Left Hand - Anterior, Right Forearm - Anterior, Scalp Centimeters inflamed pink scaly patches which clinically are and intergrade of guttate psoriasis/dermatitis-actinic keratoses  Pt currently using rose water and aloe on the scalp     A full examination was performed including scalp, head, eyes, ears, nose, lips, neck, chest, axillae, abdomen, back, buttocks, bilateral upper extremities, bilateral lower extremities, hands, feet, fingers, toes, fingernails, and toenails. All findings within normal limits unless otherwise noted below.  Areas beneath undergarment not fully examined   Assessment & Plan  Screening exam for skin cancer Scalp  Annual skin examination, encouraged to self examine home with spouse  Cherry angioma (2) Chest (Upper Torso, Anterior); Abdomen (Lower Torso, Anterior)  No intervention indicated  Epidermoid cyst Left Buccal Cheek  I discussed elective excision, no surgery scheduled  Seborrheic keratosis  Leave if  stable  Psoriasis Left Hand - Anterior; Right Forearm - Anterior; Scalp  We will use topical clobetasol daily after bathing for 4 to 6 weeks; avoid use on face and body folds.  If there is no response may consider obtaining 1 or 2 biopsies.  Destruction of lesion - Scalp  Related Medications clobetasol (OLUX) 0.05 % topical foam Apply topically 2 (two) times daily.

## 2021-10-12 NOTE — Telephone Encounter (Signed)
Encourage patient to contact the pharmacy for refills or they can request refills through [facility]  (Please schedule appointment if patient has not been seen in over a year)    WHAT PHARMACY WOULD THEY LIKE THIS SENT TO: [facility] - [address], [address]  [place], [place] [address]   MEDICATION NAME & DOSE:hydrochlorothiazide (HYDRODIURIL) 25 MG tablet   NOTES/COMMENTS FROM PATIENT:      [place] office please notify patient: It takes 48-72 hours to process rx refill requests Ask patient to call pharmacy to ensure rx is ready before heading there.

## 2021-10-23 ENCOUNTER — Telehealth: Payer: Self-pay | Admitting: Family Medicine

## 2021-10-23 ENCOUNTER — Encounter (HOSPITAL_BASED_OUTPATIENT_CLINIC_OR_DEPARTMENT_OTHER): Payer: Self-pay | Admitting: Cardiology

## 2021-10-23 ENCOUNTER — Ambulatory Visit (HOSPITAL_BASED_OUTPATIENT_CLINIC_OR_DEPARTMENT_OTHER): Payer: Medicare HMO | Admitting: Cardiology

## 2021-10-23 ENCOUNTER — Other Ambulatory Visit: Payer: Self-pay

## 2021-10-23 VITALS — BP 142/84 | HR 81 | Ht 70.0 in | Wt 169.6 lb

## 2021-10-23 DIAGNOSIS — Z8673 Personal history of transient ischemic attack (TIA), and cerebral infarction without residual deficits: Secondary | ICD-10-CM | POA: Diagnosis not present

## 2021-10-23 DIAGNOSIS — I10 Essential (primary) hypertension: Secondary | ICD-10-CM | POA: Diagnosis not present

## 2021-10-23 DIAGNOSIS — E782 Mixed hyperlipidemia: Secondary | ICD-10-CM

## 2021-10-23 DIAGNOSIS — I2510 Atherosclerotic heart disease of native coronary artery without angina pectoris: Secondary | ICD-10-CM

## 2021-10-23 DIAGNOSIS — E1121 Type 2 diabetes mellitus with diabetic nephropathy: Secondary | ICD-10-CM | POA: Diagnosis not present

## 2021-10-23 NOTE — Progress Notes (Incomplete)
Cardiology Office Note:    Date:  10/23/2021   ID:  Wayne Perkins, DOB 04-08-1941, MRN [id number]  PCP:  [name], MD  Cardiologist:  Buford Dresser, MD  Referring MD: [name], MD   CC: follow up  History of Present Illness:    Wayne Perkins is a 81 y.o. adult with a hx of TIA/CVA, CAD s/p prior stents, hypertension, type II diabetes with nephropathy/CKD, hyperlipidemia, malignant prostate cancer who is seen for follow up today. I initially met him 11/28/20 as a new consult at the request of [name], MD for the evaluation and management of ASCVD and hypertension.  CV history: -cardiologist at [facility] in [place]. Had PCI in 2004 to "the left side" by Dr. Karrie Doffing at [facility] and brings angiogram pictures of his RCA pre/post PCI 2010 by Dr. Valrie Hart at [facility].  -Angina symptom in 2004 was bilateral hand numbness, felt like he was going to pass out. Lost consciousness, was defibrillated by EMS, urgently went to cath lab. In 2010, felt fatigued, couldn't eat. Felt heaviness in his chest. Called EMS, brought to hospital, had cath the day after admission.   Today: On 07/03/2021 he called the office and reported intermittent chest pain for the past 6 months. He noted it felt like heartburn and happens mostly at night, worsening in the month prior.   He is accompanied by his wife. Overall, he is feeling pretty good. Today he notes that usually he feels the chest discomfort/heartburn when he wakes up in the morning. After drinking milk his symptoms tend to resolve within 10 minutes.  Typically he is able to do what he wants. He recently worked in the yard during the nice weather. His wife notes that he doesn't quite have as much stamina regarding his LE weakness, but this still seems to be manageable. At times he may feel a little winded, but he denies feeling physically limited.  He continues to take tylenol due to his prostate cancer.  He denies any  palpitations, or peripheral edema. No lightheadedness, headaches, syncope, orthopnea, or PND.   Past Medical History:  Diagnosis Date   Arthritis    Blood in stool    Childhood asthma    Diabetes mellitus without complication ([place])    type II   Heart disease    Hypertension, essential    Prostate cancer ([place])    Stroke [facility]     Past Surgical History:  Procedure Laterality Date   CORONARY ANGIOPLASTY WITH STENT PLACEMENT     GANGLION CYST EXCISION Right    PROSTATE BIOPSY      Current Medications: Current Outpatient Medications on File Prior to Visit  Medication Sig   Alcohol Swabs (B-D SINGLE USE SWABS REGULAR) PADS    Blood Glucose Calibration (ACCU-CHEK AVIVA) SOLN    blood glucose meter kit and supplies Use once a day in the AM for fasting sugars.   Calcium Carb-Cholecalciferol (OYSTER SHELL CALCIUM W/D) 500-200 MG-UNIT TABS TAKE 1 TABLET TWICE DAILY   calcium-vitamin D (OSCAL WITH D) 500-200 MG-UNIT tablet Take 1 tablet by mouth 2 (two) times daily.   Cholecalciferol (VITAMIN D3) 50 MCG (2000 UT) TABS Take by mouth.   clobetasol (OLUX) 0.05 % topical foam Apply topically 2 (two) times daily.   clopidogrel (PLAVIX) 75 MG tablet Take 1 tablet (75 mg total) by mouth daily.   Dulaglutide (TRULICITY) 8.88 KC/0.0LK SOPN Inject 0.75 mg into the skin once a week.   Ez Smart  Blood Glucose Lancets MISC by Does not apply route.   glucose blood test strip Use as instructed   hydrochlorothiazide (HYDRODIURIL) 25 MG tablet Take 1 tablet (25 mg total) by mouth daily.   hydrOXYzine (ATARAX) 10 MG tablet TAKE 1 TABLET BY MOUTH 3 TIMES DAILY AS NEEDED FOR ITCHING.   lactulose (CHRONULAC) 10 GM/15ML solution Take 15 mLs (10 g total) by mouth 2 (two) times daily as needed for moderate constipation or severe constipation.   Multiple Vitamin (MULTI-DAY VITAMINS PO) Take by mouth.   Multiple Vitamins-Minerals (PRESERVISION AREDS PO) Take by mouth. Soft gels   NIFEdipine (PROCARDIA  XL/NIFEDICAL-XL) 90 MG 24 hr tablet TAKE 1 TABLET EVERY DAY   ondansetron (ZOFRAN ODT) 4 MG disintegrating tablet Take 1 tablet (4 mg total) by mouth every 8 (eight) hours as needed for nausea or vomiting.   polyethylene glycol (MIRALAX / GLYCOLAX) 17 g packet Take 17 g by mouth daily.   potassium chloride SA (KLOR-CON) 20 MEQ tablet TAKE 1 TABLET TWICE DAILY   rosuvastatin (CRESTOR) 5 MG tablet Take 1 tablet (5 mg total) by mouth daily.   Saw Palmetto 450 MG CAPS Take by mouth.   triamcinolone ointment (KENALOG) 0.1 % Apply 1 application topically 2 (two) times daily.   vitamin B-12 (CYANOCOBALAMIN) 1000 MCG tablet Take 1,000 mcg by mouth daily.   No current facility-administered medications on file prior to visit.     Allergies:   Glipizide and Statins   Social History   Tobacco Use   Smoking status: Former    Types: Cigarettes   Smokeless tobacco: Never   Tobacco comments:    as a Network engineer Use: Never used  Substance Use Topics   Alcohol use: Not Currently   Drug use: Never    Family History: family history includes Depression in his brother; Early death in his brother; Heart disease in his brother; Hyperlipidemia in his mother and sister; Hypertension in his mother; Mental illness in his brother; Stroke in his father. There is no history of Prostate cancer, Breast cancer, Colon cancer, or Pancreatic cancer.  ROS:   Please see the history of present illness.   (+) Chest discomfort (+) LE Weakness (+) Exertional shortness of breath Additional pertinent ROS otherwise unremarkable.  EKGs/Labs/Other Studies Reviewed:    The following studies were reviewed today:  Echo 08/01/19 1. Left ventricular ejection fraction, by visual estimation, is 60 to  65%. The left ventricle has normal function. There is mildly increased  left ventricular hypertrophy.   2. Left ventricular diastolic parameters are consistent with Grade I  diastolic dysfunction (impaired  relaxation).   3. Global right ventricle has normal systolic function.The right  ventricular size is normal. No increase in right ventricular wall  thickness.   4. Left atrial size was normal.   5. Right atrial size was normal.   6. Presence of pericardial fat pad.   7. Mild aortic valve annular calcification.   8. The mitral valve is grossly normal. Mild mitral valve regurgitation.   9. The tricuspid valve is grossly normal. Tricuspid valve regurgitation  is mild.  10. The aortic valve is tricuspid. Aortic valve regurgitation is not  visualized.  11. The pulmonic valve was grossly normal. Pulmonic valve regurgitation is  trivial.  12. The ascending aorta was not well visualized.  13. TR signal is inadequate for assessing pulmonary artery systolic  pressure.  14. The inferior vena cava is normal in size with greater than  respiratory variability, suggesting right atrial pressure of 3 mmHg.  ° °Bilateral Carotid Dopplers 07/31/2019: °RIGHT °  °ICA:  66/21 cm/sec °  °CCA:  72/15 cm/sec °  °SYSTOLIC ICA/CCA RATIO:  0.9 °  °ECA:  66 cm/sec °  °LEFT °  °ICA:  89/23 cm/sec °  °CCA:  72/16 cm/sec °  °SYSTOLIC ICA/CCA RATIO:  1.0 °  °ECA:  81 cm/sec °  °RIGHT CAROTID ARTERY: Mild amount of calcified plaque present at the °level of the carotid bulb and proximal right ICA. Estimated right °ICA stenosis is less than 50%. °  °RIGHT VERTEBRAL ARTERY: Antegrade flow with normal waveform and °velocity. °  °LEFT CAROTID ARTERY: Minimal partially calcified plaque at the level °of the distal bulb and proximal left ICA. Estimated left ICA °stenosis is less than 50%. °  °LEFT VERTEBRAL ARTERY: Antegrade flow with normal waveform and °velocity. °  °IMPRESSION: °Mild plaque at the level of both carotid bulbs and proximal internal °carotid arteries. No significant carotid stenosis identified with °estimated bilateral ICA stenoses of less than 50%. ° °EKG:  EKG is personally reviewed.   °10/23/2021: *** °12/04/2020:  sinus rhythm with 1st degree AV block, 82 bpm ° °Recent Labs: °02/27/2021: TSH 1.20 °08/20/2021: ALT 15; BUN 30; Creatinine 1.56; Hemoglobin 10.8; Platelet Count 241; Potassium 3.4; Sodium 138  ° °Recent Lipid Panel °   °Component Value Date/Time  ° CHOL 237 (H) 02/27/2021 1342  ° TRIG 303.0 (H) 02/27/2021 1342  ° HDL 45.70 02/27/2021 1342  ° CHOLHDL 5 02/27/2021 1342  ° VLDL 60.6 (H) 02/27/2021 1342  ° LDLCALC 159 (H) 08/15/2020 1123  ° LDLDIRECT 145.0 02/27/2021 1342  ° ° °Physical Exam:   ° °VS:  BP (!) 142/84    Pulse 81    Ht 5' 10" (1.778 m)    Wt 169 lb 9.6 oz (76.9 kg)    SpO2 98%    BMI 24.34 kg/m²    ° °Wt Readings from Last 3 Encounters:  °10/23/21 169 lb 9.6 oz (76.9 kg)  °08/20/21 171 lb 9.6 oz (77.8 kg)  °05/20/21 172 lb 8 oz (78.2 kg)  °  °GEN: Well nourished, well developed in no acute distress °HEENT: Normal, moist mucous membranes °NECK: No JVD °CARDIAC: regular rhythm, normal S1 and S2, no rubs or gallops. No murmur. °VASCULAR: Radial and DP pulses 2+ bilaterally. No carotid bruits °RESPIRATORY:  Clear to auscultation without rales, wheezing or rhonchi  °ABDOMEN: Soft, non-tender, non-distended °MUSCULOSKELETAL:  Ambulates independently °SKIN: Warm and dry, no edema °NEUROLOGIC:  Alert and oriented x 3. No focal neuro deficits noted. °PSYCHIATRIC:  Normal affect   ° °ASSESSMENT:   ° °No diagnosis found. ° °PLAN:   ° °History of TIA/CVA °History of CAD with prior stents °Mixed hyperlipidemia °Type II diabetes, with nephropathy °Hypertension °-does not tolerate beta blockers °-continue clopidogrel °-discussed statins again today. He is amenable, will start with low dose rosuvastatin and monitor. Last LDL 159, goal <70 °-TG elevated on prior test, but he was not fasting. Discussed fasting state for recheck °-no symptoms currently, counseled on red flag symptoms that need immediate medical attention °-continue dulaglutide given diabetes and ASCVD. SGLT2i would be another good option given ASCVD and  diabetic nephropathy, could likely discontinue HCTZ with that.  °-BP above goal today. Previously stopped lisinopril due to concern of risks. Tolerates nifedipine. Would consider ARB given diabetic nephropathy, he wishes to continue current medications for now ° °Cardiac risk counseling and prevention recommendations: °-recommend heart healthy/Mediterranean diet, with   diet, with whole grains, fruits, vegetable, fish, lean meats, nuts, and olive oil. Limit salt. -recommend moderate walking, 3-5 times/week for 30-50 minutes each session. Aim for at least 150 minutes.week. Goal should be pace of 3 miles/hours, or walking 1.5 miles in 30 minutes -recommend avoidance of tobacco products. Avoid excess alcohol.  Plan for follow up: 6 mos or sooner as needed  Buford Dresser, MD, PhD, [facility]    Medication Adjustments/Labs and Tests Ordered: Current medicines are reviewed at length with the patient today.  Concerns regarding medicines are outlined above.   No orders of the defined types were placed in this encounter.  No orders of the defined types were placed in this encounter.  There are no Patient Instructions on file for this visit.   I,Mathew Stumpf,acting as a Education administrator for [employer], MD.,have documented all relevant documentation on the behalf of Buford Dresser, MD,as directed by  Buford Dresser, MD while in the presence of Buford Dresser, MD.  ***  Signed, Buford Dresser, MD PhD 10/23/2021 9:24 AM    [place]

## 2021-10-23 NOTE — Telephone Encounter (Signed)
Please ask pt which medication he needs Patient Assistance for (this is not included on the application)

## 2021-10-23 NOTE — Patient Instructions (Signed)
Medication Instructions:  Your Physician recommend you continue on your current medication as directed.    *If you need a refill on your cardiac medications before your next appointment, please call your pharmacy*   Lab Work: Your physician recommends that you return for lab work or get done with your primary care provider- Lipid Panel   If you have labs (blood work) drawn today and your tests are completely normal, you will receive your results only by: MyChart Message (if you have MyChart) OR A paper copy in the mail If you have any lab test that is abnormal or we need to change your treatment, we will call you to review the results.   Testing/Procedures: None ordered today    Follow-Up: At [facility], you and your health needs are our priority.  As part of our continuing mission to provide you with exceptional heart care, we have created designated Provider Care Teams.  These Care Teams include your primary Cardiologist (physician) and Advanced Practice Providers (APPs -  Physician Assistants and Nurse Practitioners) who all work together to provide you with the care you need, when you need it.  We recommend signing up for the patient portal called "MyChart".  Sign up information is provided on this After Visit Summary.  MyChart is used to connect with patients for Virtual Visits (Telemedicine).  Patients are able to view lab/test results, encounter notes, upcoming appointments, etc.  Non-urgent messages can be sent to your provider as well.   To learn more about what you can do with MyChart, go to [email or web address].    Your next appointment:   6 month(s)  The format for your next appointment:   In Person  Provider:   Buford Dresser, MD{  Other Instructions None ordered today

## 2021-10-23 NOTE — Telephone Encounter (Signed)
I have placed [employer] patient assistance program application in the bin upfront with a charge sheet. Pt states that if we place [facility] on this they will process the application faster.   Please advise

## 2021-10-28 NOTE — Telephone Encounter (Signed)
Faxed

## 2021-10-28 NOTE — Telephone Encounter (Signed)
Form completed and placed in basket  

## 2021-10-30 ENCOUNTER — Encounter: Payer: Self-pay | Admitting: Family Medicine

## 2021-10-30 ENCOUNTER — Ambulatory Visit (INDEPENDENT_AMBULATORY_CARE_PROVIDER_SITE_OTHER): Payer: Medicare HMO | Admitting: Family Medicine

## 2021-10-30 VITALS — BP 130/90 | HR 82 | Temp 97.4°F | Resp 16 | Ht 70.0 in | Wt 170.6 lb

## 2021-10-30 DIAGNOSIS — E1121 Type 2 diabetes mellitus with diabetic nephropathy: Secondary | ICD-10-CM | POA: Diagnosis not present

## 2021-10-30 DIAGNOSIS — Z0000 Encounter for general adult medical examination without abnormal findings: Secondary | ICD-10-CM | POA: Diagnosis not present

## 2021-10-30 LAB — CBC WITH DIFFERENTIAL/PLATELET
Basophils Absolute: 0 10*3/uL (ref 0.0–0.1)
Basophils Relative: 1 % (ref 0.0–3.0)
Eosinophils Absolute: 0.3 10*3/uL (ref 0.0–0.7)
Eosinophils Relative: 6.5 % — ABNORMAL HIGH (ref 0.0–5.0)
HCT: 35.4 % — ABNORMAL LOW (ref 39.0–52.0)
Hemoglobin: 12.2 g/dL — ABNORMAL LOW (ref 13.0–17.0)
Lymphocytes Relative: 21.5 % (ref 12.0–46.0)
Lymphs Abs: 1.1 10*3/uL (ref 0.7–4.0)
MCHC: 34.4 g/dL (ref 30.0–36.0)
MCV: 94.8 fl (ref 78.0–100.0)
Monocytes Absolute: 0.5 10*3/uL (ref 0.1–1.0)
Monocytes Relative: 9.2 % (ref 3.0–12.0)
Neutro Abs: 3.1 10*3/uL (ref 1.4–7.7)
Neutrophils Relative %: 61.8 % (ref 43.0–77.0)
Platelets: 250 10*3/uL (ref 150.0–400.0)
RBC: 3.73 Mil/uL — ABNORMAL LOW (ref 4.22–5.81)
RDW: 14.4 % (ref 11.5–15.5)
WBC: 5 10*3/uL (ref 4.0–10.5)

## 2021-10-30 LAB — HEPATIC FUNCTION PANEL
ALT: 14 U/L (ref 0–53)
AST: 20 U/L (ref 0–37)
Albumin: 4.4 g/dL (ref 3.5–5.2)
Alkaline Phosphatase: 215 U/L — ABNORMAL HIGH (ref 39–117)
Bilirubin, Direct: 0.1 mg/dL (ref 0.0–0.3)
Total Bilirubin: 0.4 mg/dL (ref 0.2–1.2)
Total Protein: 6.8 g/dL (ref 6.0–8.3)

## 2021-10-30 LAB — LIPID PANEL
Cholesterol: 191 mg/dL (ref 0–200)
HDL: 54.1 mg/dL (ref >39.00)
NonHDL: 137.04
Total CHOL/HDL Ratio: 4
Triglycerides: 212 mg/dL — ABNORMAL HIGH (ref 0.0–149.0)
VLDL: 42.4 mg/dL — ABNORMAL HIGH (ref 0.0–40.0)

## 2021-10-30 LAB — BASIC METABOLIC PANEL
BUN: 28 mg/dL — ABNORMAL HIGH (ref 6–23)
CO2: 30 mEq/L (ref 19–32)
Calcium: 9.4 mg/dL (ref 8.4–10.5)
Chloride: 100 mEq/L (ref 96–112)
Creatinine, Ser: 1.3 mg/dL (ref 0.40–1.50)
GFR: 51.62 mL/min — ABNORMAL LOW (ref >60.00)
Glucose, Bld: 109 mg/dL — ABNORMAL HIGH (ref 70–99)
Potassium: 3.5 mEq/L (ref 3.5–5.1)
Sodium: 140 mEq/L (ref 135–145)

## 2021-10-30 LAB — LDL CHOLESTEROL, DIRECT: Direct LDL: 93 mg/dL

## 2021-10-30 LAB — TSH: TSH: 1.48 u[IU]/mL (ref 0.35–5.50)

## 2021-10-30 LAB — HEMOGLOBIN A1C: Hgb A1c MFr Bld: 7.2 % — ABNORMAL HIGH (ref 4.6–6.5)

## 2021-10-30 NOTE — Progress Notes (Signed)
? ?  Subjective:  ? ? Patient ID: Wayne Perkins, adult    DOB: 1941-08-29, 81 y.o.   MRN: [id number] ? ?HPI ?CPE- due for eye exam, UTD on foot exam.  UTD on microalbumin.  Not interested in PNA vaccines, DEXA ? ?Patient Care Team  ?  Relationship Specialty Notifications Start End  ?[name], MD PCP - General Family Medicine  04/23/21   ?Buford Dresser, MD PCP - Cardiology Cardiology  11/28/20   ?Jerold Coombe, MD Referring Physician Urology  04/19/19   ?Cira Rue, RN Nurse Navigator Registered Nurse Medical Oncology  02/08/20   ?  ?Health Maintenance  ?Topic Date Due  ? Zoster Vaccines- Shingrix (1 of 2) Never done  ? Pneumonia Vaccine 42+ Years old (1 - PCV) Never done  ? DEXA SCAN  Never done  ? COVID-19 Vaccine (3 - Moderna risk series) 09/21/2020  ? OPHTHALMOLOGY EXAM  06/12/2021  ? HEMOGLOBIN A1C  08/30/2021  ? INFLUENZA VACCINE  11/27/2021 (Originally 03/30/2021)  ? TETANUS/TDAP  02/27/2022 (Originally 08/01/1960)  ? FOOT EXAM  02/27/2022  ? URINE MICROALBUMIN  02/27/2022  ? HPV VACCINES  Aged Out  ?  ? ? ?Review of Systems ?Patient reports no vision/hearing changes, anorexia, fever ,adenopathy, persistant/recurrent hoarseness, swallowing issues, chest pain, palpitations, edema, persistant/recurrent cough, hemoptysis, dyspnea (rest,exertional, paroxysmal nocturnal), gastrointestinal  bleeding (melena, rectal bleeding), abdominal pain, excessive heart burn, GU symptoms (dysuria, hematuria, voiding/incontinence issues) syncope, focal weakness, memory loss, numbness & tingling, skin/hair/nail changes, depression, anxiety, abnormal bruising/bleeding, musculoskeletal symptoms/signs.  ? ?This visit occurred during the SARS-CoV-2 public health emergency.  Safety protocols were in place, including screening questions prior to the visit, additional usage of staff PPE, and extensive cleaning of exam room while observing appropriate contact time as indicated for disinfecting solutions.   ?   ?Objective:  ?  Physical Exam ?General Appearance:    Alert, cooperative, no distress, appears stated age  ?Head:    Normocephalic, without obvious abnormality, atraumatic  ?Eyes:    PERRL, conjunctiva/corneas clear, EOM's intact      ?Ears:    Normal TM's and external ear canals, both ears  ?Nose:   Deferred due to COVID  ?Throat:   ?Neck:   Supple, symmetrical, trachea midline, no adenopathy;     ?  thyroid:  No enlargement/tenderness/nodules  ?Back:     Symmetric, no curvature, ROM normal, no CVA tenderness  ?Lungs:     Clear to auscultation bilaterally, respirations unlabored  ?Chest wall:    No tenderness or deformity  ?Heart:    Regular rate and rhythm, S1 and S2 normal, no murmur, rub ?  or gallop  ?Abdomen:     Soft, non-tender, bowel sounds active all four quadrants,  ?  no masses, no organomegaly  ?Genitalia:    Deferred to urology  ?Rectal:    ?Extremities:   Extremities normal, atraumatic, no cyanosis or edema  ?Pulses:   2+ and symmetric all extremities  ?Skin:   Skin color, texture, turgor normal, no rashes or lesions  ?Lymph nodes:   Cervical, supraclavicular, and axillary nodes normal  ?Neurologic:   CNII-XII intact. Normal strength, sensation and reflexes    ?  throughout  ?  ? ? ? ?   ?Assessment & Plan:  ? ? ?

## 2021-10-30 NOTE — Patient Instructions (Signed)
Follow up in 6 months to recheck diabetes, blood pressure, cholesterol ?We'll notify you of your lab results and make any changes if needed ?We'll call you with your eye exam ?Keep up the good work on healthy diet and regular physical activity ?Call with any questions or concerns ?Stay Safe!  Stay Healthy! ?Happy Spring!! ?

## 2021-10-30 NOTE — Assessment & Plan Note (Signed)
Pt's PE WNL.  UTD on foot exam, microalbumin.  Due for eye exam- pt to schedule.  Declines PNA vaccines.  Check labs.  Anticipatory guidance provided.  ?

## 2021-10-30 NOTE — Assessment & Plan Note (Signed)
Chronic problem.  Hx of good control.  UTD on foot exam and microalbumin.  Plans to schedule eye exam.  Check labs.  Adjust meds prn  ?

## 2021-11-03 ENCOUNTER — Telehealth: Payer: Self-pay

## 2021-11-03 NOTE — Telephone Encounter (Signed)
-----   Message from [name], MD sent at 11/03/2021  7:38 AM EST ----- ?Labs are all stable and look good!  No changes at this time.  Keep up the good work! ?

## 2021-11-03 NOTE — Telephone Encounter (Signed)
Patient aware of labs.  

## 2021-11-07 ENCOUNTER — Other Ambulatory Visit: Payer: Self-pay | Admitting: Family Medicine

## 2021-11-18 ENCOUNTER — Telehealth: Payer: Self-pay

## 2021-11-18 ENCOUNTER — Other Ambulatory Visit: Payer: Self-pay

## 2021-11-18 NOTE — Telephone Encounter (Signed)
Thank you for your help! I spoke to patient to update him, he states he also already called that number and the expected hold time was 30 min. I attempted to call them as well and was given an expected wait time of 50 min! Patient said we will give it a couple of days and see what happens ?

## 2021-11-18 NOTE — Telephone Encounter (Signed)
Caller name:Emeric Laboy  ? ?On DPR? :Yes ? ?Call back number:[phone] ? ?Provider they see: Birdie Riddle  ? ?Reason for call:Pt is calling he still waiting on pharmacy to get medication and since pt is out he is wanting to know how long can he go with out Dulaglutide (TRULICITY) 3.71 IR/6.7EL SOPN  before it effects him  ? ?

## 2021-11-18 NOTE — Telephone Encounter (Signed)
Wayne Perkins,  ? ?This patient is having issues getting his trulicity delivered. Can you help me with this. Papers have been completed and the company did receive them, he states they sent it to a different pharmacy this time.  ?

## 2021-11-18 NOTE — Telephone Encounter (Signed)
Yes I believe he was, from what he was describing to me that's what it sounded like. It may be helpful to give him a call again as well so you can get a better idea. I did let him know that I was reaching out to you ?

## 2021-11-18 NOTE — Telephone Encounter (Signed)
So he was getting these through patient assistance?  I will call and follow up on his application.  The medication may be on back order but I will check and see.

## 2021-11-19 ENCOUNTER — Inpatient Hospital Stay: Payer: Medicare HMO | Admitting: Oncology

## 2021-11-19 ENCOUNTER — Other Ambulatory Visit: Payer: Self-pay

## 2021-11-19 ENCOUNTER — Inpatient Hospital Stay: Payer: Medicare HMO | Attending: Oncology

## 2021-11-19 ENCOUNTER — Inpatient Hospital Stay: Payer: Medicare HMO

## 2021-11-19 VITALS — BP 147/85 | HR 67 | Temp 97.8°F | Resp 17 | Ht 70.0 in | Wt 168.7 lb

## 2021-11-19 DIAGNOSIS — C61 Malignant neoplasm of prostate: Secondary | ICD-10-CM | POA: Diagnosis not present

## 2021-11-19 DIAGNOSIS — Z888 Allergy status to other drugs, medicaments and biological substances status: Secondary | ICD-10-CM | POA: Diagnosis not present

## 2021-11-19 DIAGNOSIS — C7951 Secondary malignant neoplasm of bone: Secondary | ICD-10-CM | POA: Diagnosis not present

## 2021-11-19 DIAGNOSIS — Z79899 Other long term (current) drug therapy: Secondary | ICD-10-CM | POA: Diagnosis not present

## 2021-11-19 LAB — CBC WITH DIFFERENTIAL (CANCER CENTER ONLY)
Abs Immature Granulocytes: 0.03 10*3/uL (ref 0.00–0.07)
Basophils Absolute: 0 10*3/uL (ref 0.0–0.1)
Basophils Relative: 1 %
Eosinophils Absolute: 0.4 10*3/uL (ref 0.0–0.5)
Eosinophils Relative: 7 %
HCT: 32.5 % — ABNORMAL LOW (ref 39.0–52.0)
Hemoglobin: 11 g/dL — ABNORMAL LOW (ref 13.0–17.0)
Immature Granulocytes: 1 %
Lymphocytes Relative: 16 %
Lymphs Abs: 0.9 10*3/uL (ref 0.7–4.0)
MCH: 32 pg (ref 26.0–34.0)
MCHC: 33.8 g/dL (ref 30.0–36.0)
MCV: 94.5 fL (ref 80.0–100.0)
Monocytes Absolute: 0.5 10*3/uL (ref 0.1–1.0)
Monocytes Relative: 9 %
Neutro Abs: 3.8 10*3/uL (ref 1.7–7.7)
Neutrophils Relative %: 66 %
Platelet Count: 271 10*3/uL (ref 150–400)
RBC: 3.44 MIL/uL — ABNORMAL LOW (ref 4.22–5.81)
RDW: 13.6 % (ref 11.5–15.5)
WBC Count: 5.6 10*3/uL (ref 4.0–10.5)
nRBC: 0 % (ref 0.0–0.2)

## 2021-11-19 LAB — CMP (CANCER CENTER ONLY)
ALT: 15 U/L (ref 0–44)
AST: 20 U/L (ref 15–41)
Albumin: 4.1 g/dL (ref 3.5–5.0)
Alkaline Phosphatase: 215 U/L — ABNORMAL HIGH (ref 38–126)
Anion gap: 8 (ref 5–15)
BUN: 32 mg/dL — ABNORMAL HIGH (ref 8–23)
CO2: 30 mmol/L (ref 22–32)
Calcium: 9.8 mg/dL (ref 8.9–10.3)
Chloride: 102 mmol/L (ref 98–111)
Creatinine: 1.38 mg/dL — ABNORMAL HIGH (ref 0.61–1.24)
GFR, Estimated: 51 mL/min — ABNORMAL LOW
Glucose, Bld: 201 mg/dL — ABNORMAL HIGH (ref 70–99)
Potassium: 3.6 mmol/L (ref 3.5–5.1)
Sodium: 140 mmol/L (ref 135–145)
Total Bilirubin: 0.4 mg/dL (ref 0.3–1.2)
Total Protein: 6.9 g/dL (ref 6.5–8.1)

## 2021-11-19 MED ORDER — LEUPROLIDE ACETATE (3 MONTH) 22.5 MG ~~LOC~~ KIT
22.5000 mg | PACK | Freq: Once | SUBCUTANEOUS | Status: AC
  Administered 2021-11-19: 22.5 mg via SUBCUTANEOUS
  Filled 2021-11-19: qty 22.5

## 2021-11-19 NOTE — Progress Notes (Signed)
Hematology and Oncology Follow Up Visit ? ?Wayne Perkins ?[id number] ?12/16/1940 80 y.o. ?11/19/2021 8:43 AM ?[name], Katherine E, MDTabori, Katherine E, MD  ? ?Principle Diagnosis: 80-year-old man with advanced prostate cancer diagnosed in 2021.  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 ? ?Prior Therapy: ? ?He was on active surveillance for a period of time and subsequently had a rise in his PSA that necessitated definitive treatment and received external beam radiation as a definitive treatment.   ? ?His PSA continues to rise subsequently and was started on androgen deprivation therapy for biochemically recurrent disease.  His PSA decreased down to 0.4 in November 2018.  Due to hot flashes and increased toxicities he was receiving it intermittently and was off androgen deprivation therapy in 2019 with a PSA rise up to 10.9.   ? ?  He did receive another 6 months of androgen deprivation injection under the care of Dr. Borden and received ADT at that time after his PSA was up to 24.7.  January 2021 is PSA was 4.27 and stopped ADT.   ? ?He started developing hematuria and subsequently had a CT scan hematuria protocol in June 2021.  Small sclerotic lesions noted throughout the visualized widespread metastatic disease.  His PSA at that time was 5.68 in April 2021.  Bone scan at that time did not show any evidence of metastatic disease except for a new midthoracic focal area of increased activity.  ? ?Xtandi 160 mg daily started in June 2021.  Therapy discontinued because of poor tolerance after 3 weeks. ? ?Zytiga 500 mg daily with prednisone 5 mg daily started in May 2022.  Therapy discontinued in June 2022. ? ?Current therapy: ? ?Eligard 22.5 mg every 3 months restarted on February 29, 2020.  This will be repeated every 3 months. ? ? ? ? ?Interim History: Wayne Perkins is here for repeat evaluation.  Since the last visit, he reports no major changes in his health.  He  denies any recent hospitalizations or illnesses.  He denies any bone pain or pathological fractures.  Formal status quality of life remains unchanged. ? ? ? ?Medications: Updated on review. ?Current Outpatient Medications  ?Medication Sig Dispense Refill  ? Alcohol Swabs (B-D SINGLE USE SWABS REGULAR) PADS     ? Blood Glucose Calibration (ACCU-CHEK AVIVA) SOLN     ? blood glucose meter kit and supplies Use once a day in the AM for fasting sugars. 1 each 0  ? Calcium Carb-Cholecalciferol (OYSTER SHELL CALCIUM W/D) 500-200 MG-UNIT TABS TAKE 1 TABLET TWICE DAILY 180 tablet 3  ? calcium-vitamin D (OSCAL WITH D) 500-200 MG-UNIT tablet Take 1 tablet by mouth 2 (two) times daily. 60 tablet 3  ? Cholecalciferol (VITAMIN D3) 50 MCG (2000 UT) TABS Take by mouth.    ? clobetasol (OLUX) 0.05 % topical foam Apply topically 2 (two) times daily. 100 g 2  ? clopidogrel (PLAVIX) 75 MG tablet Take 1 tablet (75 mg total) by mouth daily. 90 tablet 1  ? Dulaglutide (TRULICITY) 0.75 MG/0.5ML SOPN Inject 0.75 mg into the skin once a week. 4 pen 0  ? Ez Smart Blood Glucose Lancets MISC by Does not apply route. 100 each 3  ? glucose blood test strip Use as instructed 100 each 3  ? hydrochlorothiazide (HYDRODIURIL) 25 MG tablet Take 1 tablet (25 mg total) by mouth daily. 90 tablet 0  ? hydrOXYzine (ATARAX) 10 MG tablet TAKE 1 TABLET BY MOUTH 3   TIMES DAILY AS NEEDED FOR ITCHING. 270 tablet 1  ? Multiple Vitamin (MULTI-DAY VITAMINS PO) Take by mouth.    ? Multiple Vitamins-Minerals (PRESERVISION AREDS PO) Take by mouth. Soft gels    ? NIFEdipine (PROCARDIA XL/NIFEDICAL-XL) 90 MG 24 hr tablet TAKE 1 TABLET EVERY DAY 90 tablet 1  ? polyethylene glycol (MIRALAX / GLYCOLAX) 17 g packet Take 17 g by mouth daily.    ? potassium chloride SA (KLOR-CON) 20 MEQ tablet TAKE 1 TABLET TWICE DAILY 180 tablet 1  ? rosuvastatin (CRESTOR) 5 MG tablet Take 1 tablet (5 mg total) by mouth daily. 90 tablet 3  ? Saw Palmetto 450 MG CAPS Take by mouth.    ?  triamcinolone ointment (KENALOG) 0.1 % Apply 1 application topically 2 (two) times daily. 90 g 1  ? vitamin B-12 (CYANOCOBALAMIN) 1000 MCG tablet Take 1,000 mcg by mouth daily.    ? ?No current facility-administered medications for this visit.  ? ? ? ?Allergies:  ?Allergies  ?Allergen Reactions  ? Glipizide Itching  ? Statins Other (See Comments)  ?  Causes narcolepsy  ? ? ? ? ?Physical Exam: ? ? ? ? ?Blood pressure (!) 147/85, pulse 67, temperature 97.8 ?F (36.6 ?C), temperature source Temporal, resp. rate 17, height 5' 10" (1.778 m), weight 168 lb 11.2 oz (76.5 kg), SpO2 100 %. ? ? ? ? ?ECOG: 1 ? ? ? ? ?General appearance: Alert, awake without any distress. ?Head: Atraumatic without abnormalities ?Oropharynx: Without any thrush or ulcers. ?Eyes: No scleral icterus. ?Lymph nodes: No lymphadenopathy noted in the cervical, supraclavicular, or axillary nodes ?Heart:regular rate and rhythm, without any murmurs or gallops.   ?Lung: Clear to auscultation without any rhonchi, wheezes or dullness to percussion. ?Abdomin: Soft, nontender without any shifting dullness or ascites. ?Musculoskeletal: No clubbing or cyanosis. ?Neurological: No motor or sensory deficits. ?Skin: No rashes or lesions. ? ? ? ? ? ? ? ? ? ?Lab Results: ?Lab Results  ?Component Value Date  ? WBC 5.0 10/30/2021  ? HGB 12.2 (L) 10/30/2021  ? HCT 35.4 (L) 10/30/2021  ? MCV 94.8 10/30/2021  ? PLT 250.0 10/30/2021  ? ?  Chemistry   ?   ?Component Value Date/Time  ? NA 140 10/30/2021 1321  ? K 3.5 10/30/2021 1321  ? CL 100 10/30/2021 1321  ? CO2 30 10/30/2021 1321  ? BUN 28 (H) 10/30/2021 1321  ? CREATININE 1.30 10/30/2021 1321  ? CREATININE 1.56 (H) 08/20/2021 0902  ? CREATININE 1.66 (H) 08/15/2020 1123  ?    ?Component Value Date/Time  ? CALCIUM 9.4 10/30/2021 1321  ? ALKPHOS 215 (H) 10/30/2021 1321  ? AST 20 10/30/2021 1321  ? AST 23 08/20/2021 0902  ? ALT 14 10/30/2021 1321  ? ALT 15 08/20/2021 0902  ? BILITOT 0.4 10/30/2021 1321  ? BILITOT 0.4  08/20/2021 0902  ?  ? ? ? Latest Reference Range & Units 02/17/21 11:37 05/20/21 09:27 08/20/21 09:02  ?Prostate Specific Ag, Serum 0.0 - 4.0 ng/mL 24.9 (H) 51.9 (H) 98.9 (H)  ?(H): Data is abnormally high ? ? ? ? ?Impression and Plan: ? ? ?80-year-old man with: ?  ?1.  Advanced prostate cancer with disease to the bone diagnosed in June 2021.  He has castration-resistant disease at this time. ? ?The natural course of his disease was reviewed at this time and treatment choices were reiterated.  He continues to decline any additional therapy despite a rise in his PSA.  The options including androgen receptor inhibitors   as well as androgen receptor pathway inhibitors among others.  Taxotere chemotherapy could also be an option but he has declined that as well. ? ?  ?2.   Androgen deprivation therapy: Risks and benefits of continuing Eligard were discussed.  We will continue indefinitely.  Next injection will be in 3 months. ?  ?3.  Bone directed therapy: He is to continue calcium and vitamin D supplements.  Delton See options been deferred. ? ?4.  Prognosis and goals of care: Aggressive measures are warranted at this time given his reasonable performance status.  He understands his disease is incurable. ?  ?  ?5.  Follow-up: In 3 months for repeat visit. ?  ?  ?30  minutes were dedicated to this encounter.  Time was spent on reviewing laboratory data, disease status update and outlining future plan of care discussion. ?  ? ?Zola Button, MD ?3/23/20238:43 AM ? ?

## 2021-11-20 LAB — PROSTATE-SPECIFIC AG, SERUM (LABCORP): Prostate Specific Ag, Serum: 146 ng/mL — ABNORMAL HIGH (ref 0.0–4.0)

## 2021-12-21 ENCOUNTER — Other Ambulatory Visit: Payer: Self-pay | Admitting: Family Medicine

## 2021-12-23 ENCOUNTER — Other Ambulatory Visit: Payer: Self-pay

## 2021-12-23 MED ORDER — POTASSIUM CHLORIDE CRYS ER 20 MEQ PO TBCR: 20.0000 meq | EXTENDED_RELEASE_TABLET | Freq: Two times a day (BID) | ORAL | 1 refills | Status: DC

## 2021-12-23 MED ORDER — NIFEDIPINE ER OSMOTIC RELEASE 90 MG PO TB24: 90.0000 mg | ORAL_TABLET | Freq: Every day | ORAL | 1 refills | Status: DC

## 2022-01-11 ENCOUNTER — Other Ambulatory Visit: Payer: Self-pay | Admitting: Family Medicine

## 2022-02-02 ENCOUNTER — Telehealth: Payer: Self-pay | Admitting: Oncology

## 2022-02-02 NOTE — Telephone Encounter (Signed)
Scheduled per 6/6 sch msg, pt has been called and confirmed

## 2022-02-23 ENCOUNTER — Other Ambulatory Visit: Payer: Self-pay

## 2022-02-23 ENCOUNTER — Inpatient Hospital Stay: Payer: Medicare HMO | Attending: Oncology

## 2022-02-23 VITALS — BP 140/83 | HR 80 | Temp 98.4°F | Resp 18

## 2022-02-23 DIAGNOSIS — C61 Malignant neoplasm of prostate: Secondary | ICD-10-CM | POA: Diagnosis not present

## 2022-02-23 DIAGNOSIS — C7951 Secondary malignant neoplasm of bone: Secondary | ICD-10-CM | POA: Insufficient documentation

## 2022-02-23 DIAGNOSIS — Z888 Allergy status to other drugs, medicaments and biological substances status: Secondary | ICD-10-CM | POA: Diagnosis not present

## 2022-02-23 DIAGNOSIS — Z79899 Other long term (current) drug therapy: Secondary | ICD-10-CM | POA: Insufficient documentation

## 2022-02-23 MED ORDER — LEUPROLIDE ACETATE (3 MONTH) 22.5 MG ~~LOC~~ KIT
22.5000 mg | PACK | Freq: Once | SUBCUTANEOUS | Status: AC
  Administered 2022-02-23: 22.5 mg via SUBCUTANEOUS
  Filled 2022-02-23: qty 22.5

## 2022-02-26 ENCOUNTER — Other Ambulatory Visit: Payer: Self-pay | Admitting: Oncology

## 2022-02-26 MED ORDER — TRAMADOL HCL 50 MG PO TABS: 50.0000 mg | ORAL_TABLET | Freq: Four times a day (QID) | ORAL | 1 refills | Status: DC | PRN

## 2022-04-21 DIAGNOSIS — H353131 Nonexudative age-related macular degeneration, bilateral, early dry stage: Secondary | ICD-10-CM | POA: Diagnosis not present

## 2022-04-21 DIAGNOSIS — H5213 Myopia, bilateral: Secondary | ICD-10-CM | POA: Diagnosis not present

## 2022-04-21 DIAGNOSIS — H2513 Age-related nuclear cataract, bilateral: Secondary | ICD-10-CM | POA: Diagnosis not present

## 2022-04-21 DIAGNOSIS — E119 Type 2 diabetes mellitus without complications: Secondary | ICD-10-CM | POA: Diagnosis not present

## 2022-04-21 LAB — HM DIABETES EYE EXAM

## 2022-04-22 ENCOUNTER — Telehealth: Payer: Self-pay

## 2022-04-23 ENCOUNTER — Encounter (INDEPENDENT_AMBULATORY_CARE_PROVIDER_SITE_OTHER): Payer: Medicare HMO | Admitting: Ophthalmology

## 2022-04-27 DIAGNOSIS — H524 Presbyopia: Secondary | ICD-10-CM | POA: Diagnosis not present

## 2022-05-04 ENCOUNTER — Other Ambulatory Visit (HOSPITAL_BASED_OUTPATIENT_CLINIC_OR_DEPARTMENT_OTHER): Payer: Self-pay | Admitting: Cardiology

## 2022-05-04 DIAGNOSIS — I2510 Atherosclerotic heart disease of native coronary artery without angina pectoris: Secondary | ICD-10-CM

## 2022-05-04 DIAGNOSIS — E782 Mixed hyperlipidemia: Secondary | ICD-10-CM

## 2022-05-05 ENCOUNTER — Ambulatory Visit (INDEPENDENT_AMBULATORY_CARE_PROVIDER_SITE_OTHER): Payer: Medicare HMO | Admitting: Family Medicine

## 2022-05-05 ENCOUNTER — Encounter: Payer: Self-pay | Admitting: Family Medicine

## 2022-05-05 VITALS — BP 118/64 | HR 74 | Temp 97.3°F | Resp 16 | Ht 70.0 in | Wt 165.1 lb

## 2022-05-05 DIAGNOSIS — C7951 Secondary malignant neoplasm of bone: Secondary | ICD-10-CM

## 2022-05-05 DIAGNOSIS — I10 Essential (primary) hypertension: Secondary | ICD-10-CM

## 2022-05-05 DIAGNOSIS — E782 Mixed hyperlipidemia: Secondary | ICD-10-CM

## 2022-05-05 DIAGNOSIS — E1121 Type 2 diabetes mellitus with diabetic nephropathy: Secondary | ICD-10-CM

## 2022-05-05 DIAGNOSIS — C61 Malignant neoplasm of prostate: Secondary | ICD-10-CM

## 2022-05-05 LAB — CBC WITH DIFFERENTIAL/PLATELET
Basophils Absolute: 0 10*3/uL (ref 0.0–0.1)
Basophils Relative: 1 % (ref 0.0–3.0)
Eosinophils Absolute: 0.4 10*3/uL (ref 0.0–0.7)
Eosinophils Relative: 7.9 % — ABNORMAL HIGH (ref 0.0–5.0)
HCT: 29.9 % — ABNORMAL LOW (ref 39.0–52.0)
Hemoglobin: 9.9 g/dL — ABNORMAL LOW (ref 13.0–17.0)
Lymphocytes Relative: 18.7 % (ref 12.0–46.0)
Lymphs Abs: 0.9 10*3/uL (ref 0.7–4.0)
MCHC: 33.1 g/dL (ref 30.0–36.0)
MCV: 94.7 fl (ref 78.0–100.0)
Monocytes Absolute: 0.5 10*3/uL (ref 0.1–1.0)
Monocytes Relative: 10 % (ref 3.0–12.0)
Neutro Abs: 3 10*3/uL (ref 1.4–7.7)
Neutrophils Relative %: 62.4 % (ref 43.0–77.0)
Platelets: 246 10*3/uL (ref 150.0–400.0)
RBC: 3.16 Mil/uL — ABNORMAL LOW (ref 4.22–5.81)
RDW: 16.7 % — ABNORMAL HIGH (ref 11.5–15.5)
WBC: 4.9 10*3/uL (ref 4.0–10.5)

## 2022-05-05 LAB — BASIC METABOLIC PANEL
BUN: 38 mg/dL — ABNORMAL HIGH (ref 6–23)
CO2: 29 mEq/L (ref 19–32)
Calcium: 9.8 mg/dL (ref 8.4–10.5)
Chloride: 101 mEq/L (ref 96–112)
Creatinine, Ser: 1.44 mg/dL (ref 0.40–1.50)
GFR: 45.49 mL/min — ABNORMAL LOW (ref >60.00)
Glucose, Bld: 183 mg/dL — ABNORMAL HIGH (ref 70–99)
Potassium: 3.7 mEq/L (ref 3.5–5.1)
Sodium: 138 mEq/L (ref 135–145)

## 2022-05-05 LAB — MICROALBUMIN / CREATININE URINE RATIO
Creatinine,U: 101.6 mg/dL
Microalb Creat Ratio: 2.4 mg/g (ref 0.0–30.0)
Microalb, Ur: 2.4 mg/dL — ABNORMAL HIGH (ref 0.0–1.9)

## 2022-05-05 LAB — HEPATIC FUNCTION PANEL
ALT: 12 U/L (ref 0–53)
AST: 21 U/L (ref 0–37)
Albumin: 4 g/dL (ref 3.5–5.2)
Alkaline Phosphatase: 320 U/L — ABNORMAL HIGH (ref 39–117)
Bilirubin, Direct: 0.1 mg/dL (ref 0.0–0.3)
Total Bilirubin: 0.3 mg/dL (ref 0.2–1.2)
Total Protein: 6.8 g/dL (ref 6.0–8.3)

## 2022-05-05 LAB — HEMOGLOBIN A1C: Hgb A1c MFr Bld: 7.9 % — ABNORMAL HIGH (ref 4.6–6.5)

## 2022-05-05 LAB — LIPID PANEL
Cholesterol: 180 mg/dL (ref 0–200)
HDL: 51.4 mg/dL (ref >39.00)
LDL Cholesterol: 94 mg/dL (ref 0–99)
NonHDL: 128.18
Total CHOL/HDL Ratio: 3
Triglycerides: 172 mg/dL — ABNORMAL HIGH (ref 0.0–149.0)
VLDL: 34.4 mg/dL (ref 0.0–40.0)

## 2022-05-05 LAB — TSH: TSH: 1.6 u[IU]/mL (ref 0.35–5.50)

## 2022-05-05 NOTE — Assessment & Plan Note (Signed)
Chronic problem.  Hx of good control on Trulicity.  UTD on eye exam, foot exam done today.  Due for A1C and microalbumin.  Check labs.  Adjust meds prn

## 2022-05-05 NOTE — Patient Instructions (Signed)
Schedule your complete physical in 6 months We'll notify you of your lab results and make any changes if needed Keep up the good work!  You look great! Call with any questions or concerns! Stay Safe!  Stay Healthy!!

## 2022-05-05 NOTE — Telephone Encounter (Signed)
Rx(s) sent to pharmacy electronically.  

## 2022-05-05 NOTE — Assessment & Plan Note (Signed)
Chronic problem.  Tolerating Crestor 5mg daily w/o difficulty. Check labs.  Adjust meds prn  

## 2022-05-05 NOTE — Progress Notes (Signed)
   Subjective:    Patient ID: Wayne Perkins, adult    DOB: 09/27/1940, 81 y.o.   MRN: [id number]  HPI DM- chronic problem, on Trulicity 0.'75mg'$  weekly.  Due for A1C, microalbumin, foot exam.  UTD on eye exam w/ Dr Katy Fitch (will attempt to get).  Denies symptomatic lows.  No numbness/tingling of hands/feet.  Last A1C 7.2%  HTN- chronic problem, on HCTZ '25mg'$  daily, Nifedipine '90mg'$  daily.  No CP, SOB, HAs, visual changes, edema.  Hyperlipidemia- chronic problem, on Crestor '5mg'$  daily.  No abd pain, N/V   Review of Systems For ROS see HPI     Objective:   Physical Exam Vitals reviewed.  Constitutional:      General: He is not in acute distress.    Appearance: Normal appearance. He is well-developed. He is not ill-appearing.  HENT:     Head: Normocephalic and atraumatic.  Eyes:     Extraocular Movements: Extraocular movements intact.     Conjunctiva/sclera: Conjunctivae normal.     Pupils: Pupils are equal, round, and reactive to light.  Neck:     Thyroid: No thyromegaly.  Cardiovascular:     Rate and Rhythm: Normal rate and regular rhythm.     Pulses: Normal pulses.     Heart sounds: Normal heart sounds. No murmur heard. Pulmonary:     Effort: Pulmonary effort is normal. No respiratory distress.     Breath sounds: Normal breath sounds.  Abdominal:     General: Bowel sounds are normal. There is no distension.     Palpations: Abdomen is soft.  Musculoskeletal:     Cervical back: Normal range of motion and neck supple.     Right lower leg: No edema.     Left lower leg: No edema.  Lymphadenopathy:     Cervical: No cervical adenopathy.  Skin:    General: Skin is warm and dry.  Neurological:     General: No focal deficit present.     Mental Status: He is alert and oriented to person, place, and time.     Cranial Nerves: No cranial nerve deficit.  Psychiatric:        Mood and Affect: Mood normal.        Behavior: Behavior normal.           Assessment & Plan:

## 2022-05-05 NOTE — Assessment & Plan Note (Signed)
Following w/ Dr Alen Blew (oncology) and Dr Jeanne Ivan (urology).  Managing pain w/ tylenol.

## 2022-05-05 NOTE — Assessment & Plan Note (Signed)
Chronic problem, on HCTZ '25mg'$  daily and Nifedipine '90mg'$  daily.  Asymptomatic at this time.  Check labs due to diuretic but no anticipated med changes.

## 2022-05-06 ENCOUNTER — Telehealth: Payer: Self-pay

## 2022-05-06 NOTE — Telephone Encounter (Signed)
-----  Message from [name], MD sent at 05/06/2022  7:42 AM EDT ----- Your A1C has jumped considerably from 7.2 --> 7.9  This will improve w/ low carb/low sugar diet and regular physical activity.  I would like to see you in 3 months rather than 6 so we can make sure we don't need to make any medication adjustments  Your Alk Phos has jumped up to 320.  This is likely due to the prostate cancer in the bone.  I will forward to Dr Alen Blew for review  Your hemoglobin (blood count) has also dropped to 9.9.  Again, I will forward to Dr Alen Blew for review  Remainder of labs are stable

## 2022-05-17 ENCOUNTER — Other Ambulatory Visit: Payer: Self-pay | Admitting: Family Medicine

## 2022-05-18 NOTE — Telephone Encounter (Signed)
Error

## 2022-05-25 ENCOUNTER — Other Ambulatory Visit: Payer: Self-pay

## 2022-05-25 ENCOUNTER — Inpatient Hospital Stay: Payer: Medicare HMO

## 2022-05-25 ENCOUNTER — Inpatient Hospital Stay: Payer: Medicare HMO | Attending: Oncology

## 2022-05-25 ENCOUNTER — Inpatient Hospital Stay (HOSPITAL_BASED_OUTPATIENT_CLINIC_OR_DEPARTMENT_OTHER): Payer: Medicare HMO | Admitting: Oncology

## 2022-05-25 VITALS — BP 150/81 | HR 89 | Temp 98.0°F | Resp 16 | Ht 70.0 in | Wt 161.1 lb

## 2022-05-25 DIAGNOSIS — C61 Malignant neoplasm of prostate: Secondary | ICD-10-CM

## 2022-05-25 DIAGNOSIS — C7951 Secondary malignant neoplasm of bone: Secondary | ICD-10-CM | POA: Insufficient documentation

## 2022-05-25 DIAGNOSIS — Z79899 Other long term (current) drug therapy: Secondary | ICD-10-CM | POA: Insufficient documentation

## 2022-05-25 DIAGNOSIS — Z888 Allergy status to other drugs, medicaments and biological substances status: Secondary | ICD-10-CM | POA: Diagnosis not present

## 2022-05-25 LAB — CBC WITH DIFFERENTIAL (CANCER CENTER ONLY)
Abs Immature Granulocytes: 0.12 10*3/uL — ABNORMAL HIGH (ref 0.00–0.07)
Basophils Absolute: 0 10*3/uL (ref 0.0–0.1)
Basophils Relative: 0 %
Eosinophils Absolute: 0.1 10*3/uL (ref 0.0–0.5)
Eosinophils Relative: 1 %
HCT: 29.1 % — ABNORMAL LOW (ref 39.0–52.0)
Hemoglobin: 9.7 g/dL — ABNORMAL LOW (ref 13.0–17.0)
Immature Granulocytes: 2 %
Lymphocytes Relative: 12 %
Lymphs Abs: 0.8 10*3/uL (ref 0.7–4.0)
MCH: 31.9 pg (ref 26.0–34.0)
MCHC: 33.3 g/dL (ref 30.0–36.0)
MCV: 95.7 fL (ref 80.0–100.0)
Monocytes Absolute: 0.8 10*3/uL (ref 0.1–1.0)
Monocytes Relative: 12 %
Neutro Abs: 4.9 10*3/uL (ref 1.7–7.7)
Neutrophils Relative %: 73 %
Platelet Count: 238 10*3/uL (ref 150–400)
RBC: 3.04 MIL/uL — ABNORMAL LOW (ref 4.22–5.81)
RDW: 15.9 % — ABNORMAL HIGH (ref 11.5–15.5)
WBC Count: 6.7 10*3/uL (ref 4.0–10.5)
nRBC: 0 % (ref 0.0–0.2)

## 2022-05-25 LAB — CMP (CANCER CENTER ONLY)
ALT: 12 U/L (ref 0–44)
AST: 54 U/L — ABNORMAL HIGH (ref 15–41)
Albumin: 4.2 g/dL (ref 3.5–5.0)
Alkaline Phosphatase: 400 U/L — ABNORMAL HIGH (ref 38–126)
Anion gap: 9 (ref 5–15)
BUN: 40 mg/dL — ABNORMAL HIGH (ref 8–23)
CO2: 28 mmol/L (ref 22–32)
Calcium: 9.2 mg/dL (ref 8.9–10.3)
Chloride: 99 mmol/L (ref 98–111)
Creatinine: 1.57 mg/dL — ABNORMAL HIGH (ref 0.61–1.24)
GFR, Estimated: 44 mL/min — ABNORMAL LOW (ref >60)
Glucose, Bld: 291 mg/dL — ABNORMAL HIGH (ref 70–99)
Potassium: 3.8 mmol/L (ref 3.5–5.1)
Sodium: 136 mmol/L (ref 135–145)
Total Bilirubin: 0.5 mg/dL (ref 0.3–1.2)
Total Protein: 7.1 g/dL (ref 6.5–8.1)

## 2022-05-25 MED ORDER — LEUPROLIDE ACETATE (3 MONTH) 22.5 MG ~~LOC~~ KIT
22.5000 mg | PACK | Freq: Once | SUBCUTANEOUS | Status: AC
  Administered 2022-05-25: 22.5 mg via SUBCUTANEOUS
  Filled 2022-05-25: qty 22.5

## 2022-05-25 NOTE — Progress Notes (Signed)
Hematology and Oncology Follow Up Visit  Wayne Perkins [id number] Apr 14, 1941 81 y.o. 05/25/2022 9:28 AM Wayne Perkins, MDTabori, [name], MD   Principle Diagnosis: 81 year old man with castration-resistant advanced prostate cancer with disease to the bone diagnosed in 2021.  He initially presented in 2015 with localized disease and Gleason score 6.   Prior Therapy:  He was on active surveillance for a period of time and subsequently had a rise in his PSA that necessitated definitive treatment and received external beam radiation as a definitive treatment in 2015.   His PSA continues to rise subsequently and was started on androgen deprivation therapy for biochemically recurrent disease.  His PSA decreased down to 0.4 in November 2018.  Due to hot flashes and increased toxicities he was receiving it intermittently and was off androgen deprivation therapy in 2019 with a PSA rise up to 10.9.      He did receive another 6 months of androgen deprivation injection under the care of Dr. Alinda Money and received ADT at that time after his PSA was up to 24.7.  January 2021 is PSA was 4.27 and stopped ADT.    He started developing hematuria and subsequently had a CT scan hematuria protocol in June 2021.  Small sclerotic lesions noted throughout the visualized widespread metastatic disease.  His PSA at that time was 5.68 in April 2021.  Bone scan at that time did not show any evidence of metastatic disease except for a new midthoracic focal area of increased activity.   Xtandi 160 mg daily started in June 2021.  Therapy discontinued because of poor tolerance after 3 weeks.  Zytiga 500 mg daily with prednisone 5 mg daily started in May 2022.  Therapy discontinued in June 2022.  Current therapy:  Eligard 22.5 mg every 3 months restarted on February 29, 2020.  He will receive his next injection today.     Interim History: Mr. Bann presents today for repeat evaluation.  Since the last visit, he reports  no major changes in his health.  He continues to have issues with periodic Bone pain predominantly in his hips and legs and uses Tylenol which is effective at this time.  He denies any nausea, vomiting or falls.  He denies any pathological fractures.  He tried tramadol for his pain which is less effective than Tylenol.  When he takes is Tylenol he feels well and able to function and has a reasonable quality of life.    Medications: Updated on review. Current Outpatient Medications  Medication Sig Dispense Refill   Alcohol Swabs (B-D SINGLE USE SWABS REGULAR) PADS      Blood Glucose Calibration (ACCU-CHEK AVIVA) SOLN      blood glucose meter kit and supplies Use once a day in the AM for fasting sugars. 1 each 0   Calcium Carb-Cholecalciferol (OYSTER SHELL CALCIUM W/D) 500-200 MG-UNIT TABS TAKE 1 TABLET TWICE DAILY 180 tablet 3   calcium-vitamin D (OSCAL WITH D) 500-200 MG-UNIT tablet Take 1 tablet by mouth 2 (two) times daily. 60 tablet 3   Cholecalciferol (VITAMIN D3) 50 MCG (2000 UT) TABS Take by mouth.     clobetasol (OLUX) 0.05 % topical foam Apply topically 2 (two) times daily. 100 g 2   clopidogrel (PLAVIX) 75 MG tablet TAKE 1 TABLET EVERY DAY 90 tablet 1   Dulaglutide (TRULICITY) 3.76 EG/3.1DV SOPN Inject 0.75 mg into the skin once a week. 4 pen 0   Ez Smart Blood Glucose Lancets MISC by Does not apply route.  100 each 3   glucose blood test strip Use as instructed 100 each 3   hydrochlorothiazide (HYDRODIURIL) 25 MG tablet TAKE 1 TABLET EVERY DAY 90 tablet 0   hydrOXYzine (ATARAX) 10 MG tablet TAKE 1 TABLET BY MOUTH 3 TIMES DAILY AS NEEDED FOR ITCHING. 270 tablet 1   Multiple Vitamin (MULTI-DAY VITAMINS PO) Take by mouth.     Multiple Vitamins-Minerals (PRESERVISION AREDS PO) Take by mouth. Soft gels     NIFEdipine (PROCARDIA XL/NIFEDICAL-XL) 90 MG 24 hr tablet TAKE 1 TABLET EVERY DAY 90 tablet 1   polyethylene glycol (MIRALAX / GLYCOLAX) 17 g packet Take 17 g by mouth daily.      potassium chloride SA (KLOR-CON M) 20 MEQ tablet TAKE 1 TABLET TWICE DAILY 180 tablet 1   rosuvastatin (CRESTOR) 5 MG tablet TAKE 1 TABLET EVERY DAY 90 tablet 1   Saw Palmetto 450 MG CAPS Take by mouth.     traMADol (ULTRAM) 50 MG tablet Take 1 tablet (50 mg total) by mouth every 6 (six) hours as needed. (Patient not taking: Reported on 05/05/2022) 30 tablet 1   vitamin B-12 (CYANOCOBALAMIN) 1000 MCG tablet Take 1,000 mcg by mouth daily.     No current facility-administered medications for this visit.     Allergies:  Allergies  Allergen Reactions   Glipizide Itching   Statins Other (See Comments)    Causes narcolepsy      Physical Exam:     Blood pressure (!) 150/81, pulse 89, temperature 98 F (36.7 C), temperature source Temporal, resp. rate 16, height _0  (1.778 m), weight 161 lb 1.6 oz (73.1 kg), SpO2 99 %.     ECOG: 1     General appearance: Comfortable appearing without any discomfort Head: Normocephalic without any trauma Oropharynx: Mucous membranes are moist and pink without any thrush or ulcers. Eyes: Pupils are equal and round reactive to light. Lymph nodes: No cervical, supraclavicular, inguinal or axillary lymphadenopathy.   Heart:regular rate and rhythm.  S1 and S2 without leg edema. Lung: Clear without any rhonchi or wheezes.  No dullness to percussion. Abdomin: Soft, nontender, nondistended with good bowel sounds.  No hepatosplenomegaly. Musculoskeletal: No joint deformity or effusion.  Full range of motion noted. Neurological: No deficits noted on motor, sensory and deep tendon reflex exam. Skin: No petechial rash or dryness.  Appeared moist.            Lab Results: Lab Results  Component Value Date   WBC 6.7 05/25/2022   HGB 9.7 (L) 05/25/2022   HCT 29.1 (L) 05/25/2022   MCV 95.7 05/25/2022   PLT 238 05/25/2022   PSA 6.98 (H) 10/27/2020     Chemistry      Component Value Date/Time   NA 138 05/05/2022 0855   K 3.7 05/05/2022  0855   CL 101 05/05/2022 0855   CO2 29 05/05/2022 0855   BUN 38 (H) 05/05/2022 0855   CREATININE 1.44 05/05/2022 0855   CREATININE 1.38 (H) 11/19/2021 0832   CREATININE 1.66 (H) 08/15/2020 1123      Component Value Date/Time   CALCIUM 9.8 05/05/2022 0855   ALKPHOS 320 (H) 05/05/2022 0855   AST 21 05/05/2022 0855   AST 20 11/19/2021 0832   ALT 12 05/05/2022 0855   ALT 15 11/19/2021 0832   BILITOT 0.3 05/05/2022 0855   BILITOT 0.4 11/19/2021 0832         Latest Reference Range & Units 08/20/21 09:02 11/19/21 08:32  Prostate Specific Ag, Serum  0.0 - 4.0 ng/mL 98.9 (H) 146.0 (H)  (H): Data is abnormally high  Impression and Plan:   81 year old man with:   1.  Castration-resistant advanced prostate cancer with disease to the bone diagnosed in June 2021.    His PSA continues to rise related to his advanced prostate cancer but he has declined additional therapy.  He is options including androgen receptor pathway inhibitors, systemic chemotherapy on others.  He understand that his disease will continue to progress but has opted against any additional treatment.  Transitioning to hospice would be an option in the future if he becomes more debilitated which she is not at this time.    2.   Androgen deprivation therapy: He is agreeable to continue with Eligard which she will receive today and repeated in 3 months.  Complications including weight gain and hot flashes among others were discussed.   3.  Bone directed therapy: He opted against Xgeva at this time.  4.  Prognosis and goals of care: Therapy is palliative and transitioning into hospice could be considered in the future.  5.  Bone pain: It is currently manageable with Tylenol.  This is related to advanced prostate cancer.  We have discussed the role for palliative radiation if he needs to in the future.     6.  Follow-up: He will return in 3 months for repeat evaluation and Eligard.     30  minutes were spent on this  visit.  The time was dedicated to updating his disease status, treatment choices and outlining future plan of care discussion.    Zola Button, MD 9/26/20239:28 AM

## 2022-05-26 ENCOUNTER — Telehealth: Payer: Self-pay | Admitting: Oncology

## 2022-05-26 NOTE — Telephone Encounter (Signed)
Spoke with patient confirming 12/27 appointment

## 2022-05-27 LAB — PROSTATE-SPECIFIC AG, SERUM (LABCORP): Prostate Specific Ag, Serum: 303 ng/mL — ABNORMAL HIGH (ref 0.0–4.0)

## 2022-06-02 ENCOUNTER — Other Ambulatory Visit: Payer: Self-pay | Admitting: Oncology

## 2022-06-02 ENCOUNTER — Telehealth: Payer: Self-pay | Admitting: *Deleted

## 2022-06-02 DIAGNOSIS — C61 Malignant neoplasm of prostate: Secondary | ICD-10-CM

## 2022-06-02 MED ORDER — TRAMADOL HCL 50 MG PO TABS: 50.0000 mg | ORAL_TABLET | Freq: Four times a day (QID) | ORAL | 1 refills | Status: DC | PRN

## 2022-06-02 NOTE — Telephone Encounter (Signed)
Wayne Perkins called. He said Dr. Alen Blew brought up managing his pain with radiation at last appt. He has given it thought and would like Dr. Alen Blew to call him so he can discuss this process further.  Message routed to Dr. Alen Blew

## 2022-06-03 ENCOUNTER — Encounter: Payer: Self-pay | Admitting: Oncology

## 2022-06-03 ENCOUNTER — Ambulatory Visit (INDEPENDENT_AMBULATORY_CARE_PROVIDER_SITE_OTHER): Payer: Medicare HMO

## 2022-06-03 ENCOUNTER — Encounter: Payer: Self-pay | Admitting: *Deleted

## 2022-06-03 ENCOUNTER — Other Ambulatory Visit: Payer: Self-pay | Admitting: Oncology

## 2022-06-03 VITALS — Ht 70.0 in | Wt 162.0 lb

## 2022-06-03 DIAGNOSIS — Z0000 Encounter for general adult medical examination without abnormal findings: Secondary | ICD-10-CM

## 2022-06-03 MED ORDER — OXYCODONE HCL 5 MG PO TABS: 5.0000 mg | ORAL_TABLET | ORAL | 0 refills | Status: DC | PRN

## 2022-06-03 NOTE — Progress Notes (Signed)
Subjective:   Wayne Perkins is a 81 y.o. male who presents for Medicare Annual/Subsequent preventive examination.   Virtual Visit via Telephone Note  I connected with  [place]son [place] on 06/03/22 at  8:15 AM EDT by telephone and verified that I am speaking with the correct person using two identifiers.  Location: Patient: home  Provider: [facility]  Persons participating in the virtual visit: patient/Nurse Health Advisor   I discussed the limitations, risks, security and privacy concerns of performing an evaluation and management service by telephone and the availability of in person appointments. The patient expressed understanding and agreed to proceed.  Interactive audio and video telecommunications were attempted between this nurse and patient, however failed, due to patient having technical difficulties OR patient did not have access to video capability.  We continued and completed visit with audio only.  Some vital signs may be absent or patient reported.   Daphane Shepherd, LPN  Review of Systems     Cardiac Risk Factors include: advanced age (>75mn, >>64women);hypertension;diabetes mellitus     Objective:    Today's Vitals   06/03/22 0821  Weight: 162 lb (73.5 kg)  Height: '5\' 10"'  (1.778 m)   Body mass index is 23.24 kg/m.     06/03/2022    8:25 AM 07/14/2020    5:21 AM 07/04/2020    5:13 PM 02/19/2020    1:46 PM 07/31/2019   11:00 AM 07/31/2019    2:15 AM  Advanced Directives  Does Patient Have a Medical Advance Directive? Yes Yes Yes Yes Yes Yes  Type of AParamedicof AGrahamLiving will Living will Living will Living will;Healthcare Power of [place] will [place] will  Does patient want to make changes to medical advance directive?    No - Patient declined No - Patient declined   Copy of [place] Chart? No - copy requested   No - copy requested       Current Medications (verified) Outpatient Encounter Medications as of 06/03/2022  Medication Sig   Alcohol Swabs (B-D SINGLE USE SWABS REGULAR) PADS    Blood Glucose Calibration (ACCU-CHEK AVIVA) SOLN    blood glucose meter kit and supplies Use once a day in the AM for fasting sugars.   Calcium Carb-Cholecalciferol (OYSTER SHELL CALCIUM W/D) 500-200 MG-UNIT TABS TAKE 1 TABLET TWICE DAILY   calcium-vitamin D (OSCAL WITH D) 500-200 MG-UNIT tablet Take 1 tablet by mouth 2 (two) times daily.   Cholecalciferol (VITAMIN D3) 50 MCG (2000 UT) TABS Take by mouth.   clobetasol (OLUX) 0.05 % topical foam Apply topically 2 (two) times daily.   clopidogrel (PLAVIX) 75 MG tablet TAKE 1 TABLET EVERY DAY   Dulaglutide (TRULICITY) 04.58MPF/2.9WKSOPN Inject 0.75 mg into the skin once a week.   Ez Smart Blood Glucose Lancets MISC by Does not apply route.   glucose blood test strip Use as instructed   hydrochlorothiazide (HYDRODIURIL) 25 MG tablet TAKE 1 TABLET EVERY DAY   hydrOXYzine (ATARAX) 10 MG tablet TAKE 1 TABLET BY MOUTH 3 TIMES DAILY AS NEEDED FOR ITCHING.   Multiple Vitamin (MULTI-DAY VITAMINS PO) Take by mouth.   Multiple Vitamins-Minerals (PRESERVISION AREDS PO) Take by mouth. Soft gels   NIFEdipine (PROCARDIA XL/NIFEDICAL-XL) 90 MG 24 hr tablet TAKE 1 TABLET EVERY DAY   polyethylene glycol (MIRALAX / GLYCOLAX) 17 g packet Take 17 g by mouth daily.   potassium chloride SA (KLOR-CON M) 20  MEQ tablet TAKE 1 TABLET TWICE DAILY   rosuvastatin (CRESTOR) 5 MG tablet TAKE 1 TABLET EVERY DAY   Saw Palmetto 450 MG CAPS Take by mouth.   traMADol (ULTRAM) 50 MG tablet Take 1 tablet (50 mg total) by mouth every 6 (six) hours as needed.   traMADol (ULTRAM) 50 MG tablet Take 1 tablet (50 mg total) by mouth every 6 (six) hours as needed.   vitamin B-12 (CYANOCOBALAMIN) 1000 MCG tablet Take 1,000 mcg by mouth daily.   No facility-administered encounter medications on file as of 06/03/2022.    Allergies  (verified) Glipizide and Statins   History: Past Medical History:  Diagnosis Date   Arthritis    Blood in stool    Childhood asthma    Diabetes mellitus without complication ([place])    type II   Heart disease    Hypertension, essential    Prostate cancer ([place])    Stroke [facility])    Past Surgical History:  Procedure Laterality Date   CORONARY ANGIOPLASTY WITH STENT PLACEMENT     GANGLION CYST EXCISION Right    PROSTATE BIOPSY     Family History  Problem Relation Age of Onset   Hyperlipidemia Mother    Hypertension Mother    Stroke Father    Hyperlipidemia Sister    Depression Brother    Early death Brother    Heart disease Brother    Mental illness Brother    Prostate cancer Neg Hx    Breast cancer Neg Hx    Colon cancer Neg Hx    Pancreatic cancer Neg Hx    Social History   Socioeconomic History   Marital status: Married    Spouse name: Enid Derry   Number of children: 3   Years of education: Not on file   Highest education level: Not on file  Occupational History    Comment: retired  Tobacco Use   Smoking status: Former    Types: Cigarettes   Smokeless tobacco: Never   Tobacco comments:    as a Network engineer Use: Never used  Substance and Sexual Activity   Alcohol use: Not Currently   Drug use: Never   Sexual activity: Yes    Partners: Female  Other Topics Concern   Not on file  Social History Narrative   Not on file   Social Determinants of Health   Financial Resource Strain: Low Risk  (06/03/2022)   Overall Financial Resource Strain (CARDIA)    Difficulty of Paying Living Expenses: Not hard at all  Food Insecurity: No Food Insecurity (06/03/2022)   Hunger Vital Sign    Worried About Running Out of Food in the Last Year: Never true    Ran Out of Food in the Last Year: Never true  Transportation Needs: No Transportation Needs (06/03/2022)   PRAPARE - Hydrologist (Medical): No    Lack of Transportation  (Non-Medical): No  Physical Activity: Insufficiently Active (06/03/2022)   Exercise Vital Sign    Days of Exercise per Week: 3 days    Minutes of Exercise per Session: 30 min  Stress: No Stress Concern Present (06/03/2022)   [place]    Feeling of Stress : Not at all  Social Connections: Moderately Integrated (06/03/2022)   Social Connection and Isolation Panel [NHANES]    Frequency of Communication with Friends and Family: More than three times a week    Frequency  of Social Gatherings with Friends and Family: More than three times a week    Attends Religious Services: More than 4 times per year    Active Member of [employer] or Organizations: No    Attends Music therapist: Never    Marital Status: Married    Tobacco Counseling Counseling given: Not Answered 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  No  Does the patient have any non-healing wounds?  No  Has the patient had any unintentional weight loss or weight gain?  No   Diabetes:  Is the patient diabetic?  Yes  If diabetic, was a CBG obtained today?  No  Did the patient bring in their glucometer from home?  No  How often do you monitor your CBG's? Daily .   Financial Strains and Diabetes Management:  Are you having any financial strains with the device, your supplies or your medication? No .  Does the patient want to be seen by Chronic Care Management for management of their diabetes?  No  Would the patient like to be referred to a Nutritionist or for Diabetic Management?  No    Diabetic Exams:  Diabetic Eye Exam: Completed 03/2022 Diabetic Foot Exam: Overdue, Pt has been advised about the importance in completing this exam. Pt is scheduled for diabetic foot exam on next office visit .   Interpreter Needed?: No  Information entered by :: Jadene Pierini, LPN   Activities of Daily Living    06/03/2022    8:25 AM  In your present state of health, do you have any difficulty performing the following activities:  Hearing? 0  Vision? 0  Difficulty concentrating or making decisions? 0  Walking or climbing stairs? 0  Dressing or bathing? 0  Doing errands, shopping? 0  Preparing Food and eating ? N  Using the Toilet? N  In the past six months, have you accidently leaked urine? N  Do you have problems with loss of bowel control? N  Managing your Medications? N  Managing your Finances? N  Housekeeping or managing your Housekeeping? N    Patient Care Team: [name], MD as PCP - General (Family Medicine) Buford Dresser, MD as PCP - Cardiology (Cardiology) Jerold Coombe, MD as Referring Physician (Urology) Cira Rue, RN Nurse Navigator as Registered Nurse (Medical Oncology)  Indicate any recent Medical Services you may have received from other than [facility] providers in the past year (date may be approximate).     Assessment:   This is a routine wellness examination for [place].  Hearing/Vision screen Vision Screening - Comments:: Annual eye exam wear glasses   Dietary issues and exercise activities discussed: Current Exercise Habits: Home exercise routine, Type of exercise: walking, Time (Minutes): 30, Frequency (Times/Week): 3, Weekly Exercise (Minutes/Week): 90, Intensity: Mild, Exercise limited by: orthopedic condition(s)   Goals Addressed             This Visit's Progress    Exercise 3x per week (30 min per time)         Depression Screen    06/03/2022    8:24 AM 05/05/2022  8:16 AM 10/30/2021   12:35 PM 02/27/2021    12:46 PM 07/11/2020   10:29 AM 07/04/2020    1:10 PM 02/14/2020    9:44 AM  PHQ 2/9 Scores  PHQ - 2 Score 0 0 0 0 0 0 0  PHQ- 9 Score 0 1 0 0       Fall Risk    06/03/2022    8:22 AM 05/05/2022    8:18 AM 10/30/2021   12:36 PM 02/27/2021   12:46 PM 08/15/2020   10:09 AM  Fall Risk   Falls in the past year? 0 0 0 0 0  Number falls in past yr: 0   0   Injury with Fall? 0   0   Risk for fall due to : No Fall Risks No Fall Risks No Fall Risks No Fall Risks   Follow up Falls prevention discussed Falls evaluation completed Falls evaluation completed      FALL RISK PREVENTION PERTAINING TO THE HOME:  Any stairs in or around the home? No  If so, are there any without handrails? No  Home free of loose throw rugs in walkways, pet beds, electrical cords, etc? Yes  Adequate lighting in your home to reduce risk of falls? Yes   ASSISTIVE DEVICES UTILIZED TO PREVENT FALLS:  Life alert? No  Use of a cane, walker or w/c? Yes  Grab bars in the bathroom? Yes  Shower chair or bench in shower? Yes  Elevated toilet seat or a handicapped toilet? No      06/03/2022    8:26 AM  6CIT Screen  What Year? 0 points  What month? 0 points  What time? 0 points  Count back from 20 0 points  Months in reverse 0 points  Repeat phrase 0 points  Total Score 0 points    Immunizations Immunization History  Administered Date(s) Administered   Moderna Sars-Covid-2 Vaccination 07/25/2020, 08/24/2020   PFIZER(Purple Top)SARS-COV-2 Vaccination 07/25/2020    TDAP status: Due, Education has been provided regarding the importance of this vaccine. Advised may receive this vaccine at local pharmacy or Health Dept. Aware to provide a copy of the vaccination record if obtained from local pharmacy or Health Dept. Verbalized acceptance and understanding.  Flu Vaccine status: Due, Education has been provided regarding the importance of this vaccine. Advised may receive this vaccine at local pharmacy or Health Dept.  Aware to provide a copy of the vaccination record if obtained from local pharmacy or Health Dept. Verbalized acceptance and understanding.  Pneumococcal vaccine status: Due, Education has been provided regarding the importance of this vaccine. Advised may receive this vaccine at local pharmacy or Health Dept. Aware to provide a copy of the vaccination record if obtained from local pharmacy or Health Dept. Verbalized acceptance and understanding.  Covid-19 vaccine status: Completed vaccines  Qualifies for Shingles Vaccine? Yes   Zostavax completed No   Shingrix Completed?: No.    Education has been provided regarding the importance of this vaccine. Patient has been advised to call insurance company to determine out of pocket expense if they have not yet received this vaccine. Advised may also receive vaccine at local pharmacy or Health Dept. Verbalized acceptance and understanding.  Screening Tests Health Maintenance  Topic Date Due   DEXA SCAN  Never done   OPHTHALMOLOGY EXAM  06/12/2021   INFLUENZA VACCINE  11/28/2022 (Originally 03/30/2022)   Pneumonia Vaccine 39+ Years old (1 - PCV) 05/06/2023 (Originally 07/31/2005)   HEMOGLOBIN A1C  11/03/2022  Diabetic kidney evaluation - Urine ACR  05/06/2023   FOOT EXAM  05/06/2023   Diabetic kidney evaluation - GFR measurement  05/26/2023   HPV VACCINES  Aged Out   TETANUS/TDAP  Discontinued   COVID-19 Vaccine  Discontinued   Zoster Vaccines- Shingrix  Discontinued    Health Maintenance  Health Maintenance Due  Topic Date Due   DEXA SCAN  Never done   OPHTHALMOLOGY EXAM  06/12/2021    Colorectal cancer screening: No longer required.   Lung Cancer Screening: (Low Dose CT Chest recommended if Age 64-80 years, 30 pack-year currently smoking OR have quit w/in 15years.) does not qualify.   Lung Cancer Screening Referral: n/a  Additional Screening:  Hepatitis C Screening: does not qualify;   Vision Screening: Recommended annual  ophthalmology exams for early detection of glaucoma and other disorders of the eye. Is the patient up to date with their annual eye exam?  Yes  Who is the provider or what is the name of the office in which the patient attends annual eye exams? Dr.Groat  If pt is not established with a provider, would they like to be referred to a provider to establish care? No .   Dental Screening: Recommended annual dental exams for proper oral hygiene  Community Resource Referral / Chronic Care Management: CRR required this visit?  No   CCM required this visit?  No      Plan:     I have personally reviewed and noted the following in the patient's chart:   Medical and social history Use of alcohol, tobacco or illicit drugs  Current medications and supplements including opioid prescriptions. Patient is not currently taking opioid prescriptions. Functional ability and status Nutritional status Physical activity Advanced directives List of other physicians Hospitalizations, surgeries, and ER visits in previous 12 months Vitals Screenings to include cognitive, depression, and falls Referrals and appointments  In addition, I have reviewed and discussed with patient certain preventive protocols, quality metrics, and best practice recommendations. A written personalized care plan for preventive services as well as general preventive health recommendations were provided to patient.     Daphane Shepherd, LPN   [id number]/[id number]   Nurse Notes: Due Flu /TDAP Vaccine

## 2022-06-03 NOTE — Patient Instructions (Signed)
Wayne Perkins , Thank you for taking time to come for your Medicare Wellness Visit. I appreciate your ongoing commitment to your health goals. Please review the following plan we discussed and let me know if I can assist you in the future.   These are the goals we discussed:  Goals      Exercise 3x per week (30 min per time)        This is a list of the screening recommended for you and due dates:  Health Maintenance  Topic Date Due   DEXA scan (bone density measurement)  Never done   Eye exam for diabetics  06/12/2021   Flu Shot  11/28/2022*   Pneumonia Vaccine (1 - PCV) 05/06/2023*   Hemoglobin A1C  11/03/2022   Yearly kidney health urinalysis for diabetes  05/06/2023   Complete foot exam   05/06/2023   Yearly kidney function blood test for diabetes  05/26/2023   HPV Vaccine  Aged Out   Tetanus Vaccine  Discontinued   COVID-19 Vaccine  Discontinued   Zoster (Shingles) Vaccine  Discontinued  *Topic was postponed. The date shown is not the original due date.    Advanced directives: Please bring a copy of your health care power of attorney and living will to the office to be added to your chart at your convenience.   Conditions/risks identified:Aim for 30 minutes of exercise or brisk walking, 6-8 glasses of water, and 5 servings of fruits and vegetables each day.   Next appointment: Follow up in one year for your annual wellness visit.   Preventive Care 31 Years and Older, Male  Preventive care refers to lifestyle choices and visits with your health care provider that can promote health and wellness. What does preventive care include? A yearly physical exam. This is also called an annual well check. Dental exams once or twice a year. Routine eye exams. Ask your health care provider how often you should have your eyes checked. Personal lifestyle choices, including: Daily care of your teeth and gums. Regular physical activity. Eating a healthy diet. Avoiding tobacco and drug  use. Limiting alcohol use. Practicing safe sex. Taking low doses of aspirin every day. Taking vitamin and mineral supplements as recommended by your health care provider. What happens during an annual well check? The services and screenings done by your health care provider during your annual well check will depend on your age, overall health, lifestyle risk factors, and family history of disease. Counseling  Your health care provider may ask you questions about your: Alcohol use. Tobacco use. Drug use. Emotional well-being. Home and relationship well-being. Sexual activity. Eating habits. History of falls. Memory and ability to understand (cognition). Work and work Statistician. Screening  You may have the following tests or measurements: Height, weight, and BMI. Blood pressure. Lipid and cholesterol levels. These may be checked every 5 years, or more frequently if you are over 34 years old. Skin check. Lung cancer screening. You may have this screening every year starting at age 64 if you have a 30-pack-year history of smoking and currently smoke or have quit within the past 15 years. Fecal occult blood test (FOBT) of the stool. You may have this test every year starting at age 51. Flexible sigmoidoscopy or colonoscopy. You may have a sigmoidoscopy every 5 years or a colonoscopy every 10 years starting at age 27. Prostate cancer screening. Recommendations will vary depending on your family history and other risks. Hepatitis C blood test. Hepatitis B blood test.  Sexually transmitted disease (STD) testing. Diabetes screening. This is done by checking your blood sugar (glucose) after you have not eaten for a while (fasting). You may have this done every 1-3 years. Abdominal aortic aneurysm (AAA) screening. You may need this if you are a current or former smoker. Osteoporosis. You may be screened starting at age 57 if you are at high risk. Talk with your health care provider about  your test results, treatment options, and if necessary, the need for more tests. Vaccines  Your health care provider may recommend certain vaccines, such as: Influenza vaccine. This is recommended every year. Tetanus, diphtheria, and acellular pertussis (Tdap, Td) vaccine. You may need a Td booster every 10 years. Zoster vaccine. You may need this after age 61. Pneumococcal 13-valent conjugate (PCV13) vaccine. One dose is recommended after age 51. Pneumococcal polysaccharide (PPSV23) vaccine. One dose is recommended after age 66. Talk to your health care provider about which screenings and vaccines you need and how often you need them. This information is not intended to replace advice given to you by your health care provider. Make sure you discuss any questions you have with your health care provider. Document Released: 09/12/2015 Document Revised: 05/05/2016 Document Reviewed: 06/17/2015 Elsevier Interactive Patient Education  2017 [place] Prevention in the Home Falls can cause injuries. They can happen to people of all ages. There are many things you can do to make your home safe and to help prevent falls. What can I do on the outside of my home? Regularly fix the edges of walkways and driveways and fix any cracks. Remove anything that might make you trip as you walk through a door, such as a raised step or threshold. Trim any bushes or trees on the path to your home. Use bright outdoor lighting. Clear any walking paths of anything that might make someone trip, such as rocks or tools. Regularly check to see if handrails are loose or broken. Make sure that both sides of any steps have handrails. Any raised decks and porches should have guardrails on the edges. Have any leaves, snow, or ice cleared regularly. Use sand or salt on walking paths during winter. Clean up any spills in your garage right away. This includes oil or grease spills. What can I do in the bathroom? Use  night lights. Install grab bars by the toilet and in the tub and shower. Do not use towel bars as grab bars. Use non-skid mats or decals in the tub or shower. If you need to sit down in the shower, use a plastic, non-slip stool. Keep the floor dry. Clean up any water that spills on the floor as soon as it happens. Remove soap buildup in the tub or shower regularly. Attach bath mats securely with double-sided non-slip rug tape. Do not have throw rugs and other things on the floor that can make you trip. What can I do in the bedroom? Use night lights. Make sure that you have a light by your bed that is easy to reach. Do not use any sheets or blankets that are too big for your bed. They should not hang down onto the floor. Have a firm chair that has side arms. You can use this for support while you get dressed. Do not have throw rugs and other things on the floor that can make you trip. What can I do in the kitchen? Clean up any spills right away. Avoid walking on wet floors. Keep items that you  use a lot in easy-to-reach places. If you need to reach something above you, use a strong step stool that has a grab bar. Keep electrical cords out of the way. Do not use floor polish or wax that makes floors slippery. If you must use wax, use non-skid floor wax. Do not have throw rugs and other things on the floor that can make you trip. What can I do with my stairs? Do not leave any items on the stairs. Make sure that there are handrails on both sides of the stairs and use them. Fix handrails that are broken or loose. Make sure that handrails are as long as the stairways. Check any carpeting to make sure that it is firmly attached to the stairs. Fix any carpet that is loose or worn. Avoid having throw rugs at the top or bottom of the stairs. If you do have throw rugs, attach them to the floor with carpet tape. Make sure that you have a light switch at the top of the stairs and the bottom of the  stairs. If you do not have them, ask someone to add them for you. What else can I do to help prevent falls? Wear shoes that: Do not have high heels. Have rubber bottoms. Are comfortable and fit you well. Are closed at the toe. Do not wear sandals. If you use a stepladder: Make sure that it is fully opened. Do not climb a closed stepladder. Make sure that both sides of the stepladder are locked into place. Ask someone to hold it for you, if possible. Clearly mark and make sure that you can see: Any grab bars or handrails. First and last steps. Where the edge of each step is. Use tools that help you move around (mobility aids) if they are needed. These include: Canes. Walkers. Scooters. Crutches. Turn on the lights when you go into a dark area. Replace any light bulbs as soon as they burn out. Set up your furniture so you have a clear path. Avoid moving your furniture around. If any of your floors are uneven, fix them. If there are any pets around you, be aware of where they are. Review your medicines with your doctor. Some medicines can make you feel dizzy. This can increase your chance of falling. Ask your doctor what other things that you can do to help prevent falls. This information is not intended to replace advice given to you by your health care provider. Make sure you discuss any questions you have with your health care provider. Document Released: 06/12/2009 Document Revised: 01/22/2016 Document Reviewed: 09/20/2014 Elsevier Interactive Patient Education  2017 [employer].

## 2022-06-03 NOTE — Telephone Encounter (Signed)
Dr. Alen Blew prescribed ultram for patient and patient stated intolerance. Added to patient allergy list. MD informed and prescribed oxycodone. Patient informed and verbalized understanding of me use and directions.

## 2022-06-14 ENCOUNTER — Other Ambulatory Visit: Payer: Self-pay | Admitting: Oncology

## 2022-06-14 DIAGNOSIS — C61 Malignant neoplasm of prostate: Secondary | ICD-10-CM

## 2022-06-14 NOTE — Progress Notes (Signed)
Radiation Oncology         [phone] ________________________________  Outpatient Consultation  Name: Wayne Perkins MRN: [id number]  Date of Service: 06/15/2022 DOB: 1941-04-20  RF:[id number], [name], MD  Wyatt Portela, MD   REFERRING PHYSICIAN: Wyatt Portela, MD  DIAGNOSIS: There were no encounter diagnoses.  No diagnosis found.  HISTORY OF PRESENT ILLNESS: Wayne Perkins is a 81 y.o. adult seen at the request of Dr. Alen Blew. We previously met this patient in our multidisciplinary prostate clinic on 02/19/20. In summary, he was diagnosed with low volume Gleason 3+3 prostate cancer in 2012. He proceeded with surveillance until 2015, when he elected to proceed with IMRT while living in [place]. Sometime thereafter, he received 9 months of ADT for a rising PSA and questionable left ischial lesion, but he discontinued due to side effects including hot flashes, weight gain, and development of diabetes requiring treatment with metformin. His PSA rose again in 2019, though staging scans in 12/2018 were negative. When his PSA reached 24.7 in 02/2019, he resumed ADT through 08/2019. Restaging CT A/P and bone scan in 01/2020 revealed new bone lesions in the midthoracic spine and right posterior 4th rib. Given he was asymptomatic at that time, the recommendation was to resume ADT and add Xtandi.  He started Xtandi under Dr. Alen Blew in 01/2020 but discontinued after 3 weeks due to poor tolerance. He then tried [place] in 12/2020 but also discontinued this shortly thereafter. He resumed ADT on 02/29/20, which he continues on currently. Despite this, his PSA has continued to rise, reaching 303 on most recent labs from 05/25/22. Per Dr. Hazeline Junker note from the same day, he is experiencing intermittent bone pain in his hips and legs and has declined additional systemic therapies, as well as bone directed therapy.  PREVIOUS RADIATION THERAPY: Yes  2015: Definitive prostate radotherapy ([place] Dr. Irish Elders)  PAST MEDICAL  HISTORY:  Past Medical History:  Diagnosis Date   Arthritis    Blood in stool    Childhood asthma    Diabetes mellitus without complication ([place])    type II   Heart disease    Hypertension, essential    Prostate cancer ([place])    Stroke ([place])       PAST SURGICAL HISTORY: Past Surgical History:  Procedure Laterality Date   CORONARY ANGIOPLASTY WITH STENT PLACEMENT     GANGLION CYST EXCISION Right    PROSTATE BIOPSY      FAMILY HISTORY:  Family History  Problem Relation Age of Onset   Hyperlipidemia Mother    Hypertension Mother    Stroke Father    Hyperlipidemia Sister    Depression Brother    Early death Brother    Heart disease Brother    Mental illness Brother    Prostate cancer Neg Hx    Breast cancer Neg Hx    Colon cancer Neg Hx    Pancreatic cancer Neg Hx     SOCIAL HISTORY:  Social History   Socioeconomic History   Marital status: Married    Spouse name: Enid Derry   Number of children: 3   Years of education: Not on file   Highest education level: Not on file  Occupational History    Comment: retired  Tobacco Use   Smoking status: Former    Types: Cigarettes   Smokeless tobacco: Never   Tobacco comments:    as a Network engineer Use: Never used  Substance and Sexual Activity   Alcohol use:  Not Currently   Drug use: Never   Sexual activity: Yes    Partners: Female  Other Topics Concern   Not on file  Social History Narrative   Not on file   Social Determinants of Health   Financial Resource Strain: Low Risk  (06/03/2022)   Overall Financial Resource Strain (CARDIA)    Difficulty of Paying Living Expenses: Not hard at all  Food Insecurity: No Food Insecurity (06/03/2022)   Hunger Vital Sign    Worried About Running Out of Food in the Last Year: Never true    Ran Out of Food in the Last Year: Never true  Transportation Needs: No Transportation Needs (06/03/2022)   PRAPARE - Hydrologist (Medical): No     Lack of Transportation (Non-Medical): No  Physical Activity: Insufficiently Active (06/03/2022)   Exercise Vital Sign    Days of Exercise per Week: 3 days    Minutes of Exercise per Session: 30 min  Stress: No Stress Concern Present (06/03/2022)   [place]    Feeling of Stress : Not at all  Social Connections: Moderately Integrated (06/03/2022)   Social Connection and Isolation Panel [NHANES]    Frequency of Communication with Friends and Family: More than three times a week    Frequency of Social Gatherings with Friends and Family: More than three times a week    Attends Religious Services: More than 4 times per year    Active Member of [employer] or Organizations: No    Attends Archivist Meetings: Never    Marital Status: Married  Human resources officer Violence: Not At Risk (06/03/2022)   Humiliation, Afraid, Rape, and Kick questionnaire    Fear of Current or Ex-Partner: No    Emotionally Abused: No    Physically Abused: No    Sexually Abused: No    ALLERGIES: Tramadol, Glipizide, and Statins  MEDICATIONS:  Current Outpatient Medications  Medication Sig Dispense Refill   Alcohol Swabs (B-D SINGLE USE SWABS REGULAR) PADS      Blood Glucose Calibration (ACCU-CHEK AVIVA) SOLN      blood glucose meter kit and supplies Use once a day in the AM for fasting sugars. 1 each 0   Calcium Carb-Cholecalciferol (OYSTER SHELL CALCIUM W/D) 500-5 MG-MCG TABS TAKE 1 TABLET TWICE DAILY 180 tablet 10   calcium-vitamin D (OSCAL WITH D) 500-200 MG-UNIT tablet Take 1 tablet by mouth 2 (two) times daily. 60 tablet 3   Cholecalciferol (VITAMIN D3) 50 MCG (2000 UT) TABS Take by mouth.     clobetasol (OLUX) 0.05 % topical foam Apply topically 2 (two) times daily. 100 g 2   clopidogrel (PLAVIX) 75 MG tablet TAKE 1 TABLET EVERY DAY 90 tablet 1   Dulaglutide (TRULICITY) 1.79 XT/0.5WP SOPN Inject 0.75 mg into the skin once a week. 4 pen 0    Ez Smart Blood Glucose Lancets MISC by Does not apply route. 100 each 3   glucose blood test strip Use as instructed 100 each 3   hydrochlorothiazide (HYDRODIURIL) 25 MG tablet TAKE 1 TABLET EVERY DAY 90 tablet 0   hydrOXYzine (ATARAX) 10 MG tablet TAKE 1 TABLET BY MOUTH 3 TIMES DAILY AS NEEDED FOR ITCHING. 270 tablet 1   Multiple Vitamin (MULTI-DAY VITAMINS PO) Take by mouth.     Multiple Vitamins-Minerals (PRESERVISION AREDS PO) Take by mouth. Soft gels     NIFEdipine (PROCARDIA XL/NIFEDICAL-XL) 90 MG 24 hr tablet TAKE 1 TABLET  EVERY DAY 90 tablet 1   oxyCODONE (OXY IR/ROXICODONE) 5 MG immediate release tablet Take 1 tablet (5 mg total) by mouth every 4 (four) hours as needed for severe pain. 30 tablet 0   polyethylene glycol (MIRALAX / GLYCOLAX) 17 g packet Take 17 g by mouth daily.     potassium chloride SA (KLOR-CON M) 20 MEQ tablet TAKE 1 TABLET TWICE DAILY 180 tablet 1   rosuvastatin (CRESTOR) 5 MG tablet TAKE 1 TABLET EVERY DAY 90 tablet 1   vitamin B-12 (CYANOCOBALAMIN) 1000 MCG tablet Take 1,000 mcg by mouth daily.     No current facility-administered medications for this encounter.    REVIEW OF SYSTEMS:  On review of systems, the patient reports that he is doing well overall. He denies any chest pain, shortness of breath, cough, fevers, chills, night sweats, unintended weight changes. He denies any bowel or bladder disturbances, and denies abdominal pain, nausea or vomiting. He reports ***. A complete review of systems is obtained and is otherwise negative.    PHYSICAL EXAM:  Wt Readings from Last 3 Encounters:  06/03/22 162 lb (73.5 kg)  05/25/22 161 lb 1.6 oz (73.1 kg)  05/05/22 165 lb 2 oz (74.9 kg)   Temp Readings from Last 3 Encounters:  05/25/22 98 F (36.7 C) (Temporal)  05/05/22 (!) 97.3 F (36.3 C) (Temporal)  02/23/22 98.4 F (36.9 C) (Oral)   BP Readings from Last 3 Encounters:  05/25/22 (!) 150/81  05/05/22 118/64  02/23/22 140/83   Pulse Readings from  Last 3 Encounters:  05/25/22 89  05/05/22 74  02/23/22 80    /10  In general this is a well appearing *** man in no acute distress. He's alert and oriented x4 and appropriate throughout the examination. Cardiopulmonary assessment is negative for acute distress and he exhibits normal effort.     KPS = ***  100 - Normal; no complaints; no evidence of disease. 90   - Able to carry on normal activity; minor signs or symptoms of disease. 80   - Normal activity with effort; some signs or symptoms of disease. 38   - Cares for self; unable to carry on normal activity or to do active work. 60   - Requires occasional assistance, but is able to care for most of his personal needs. 50   - Requires considerable assistance and frequent medical care. 66   - Disabled; requires special care and assistance. 50   - Severely disabled; hospital admission is indicated although death not imminent. 62   - Very sick; hospital admission necessary; active supportive treatment necessary. 10   - Moribund; fatal processes progressing rapidly. 0     - Dead  Karnofsky DA, Abelmann [place], Craver LS and Burchenal [facility] [phone]) The use of the nitrogen mustards in the palliative treatment of carcinoma: with particular reference to bronchogenic carcinoma Cancer 1 [id number]  LABORATORY DATA:  Lab Results  Component Value Date   WBC 6.7 05/25/2022   HGB 9.7 (L) 05/25/2022   HCT 29.1 (L) 05/25/2022   MCV 95.7 05/25/2022   PLT 238 05/25/2022   Lab Results  Component Value Date   NA 136 05/25/2022   K 3.8 05/25/2022   CL 99 05/25/2022   CO2 28 05/25/2022   Lab Results  Component Value Date   ALT 12 05/25/2022   AST 54 (H) 05/25/2022   ALKPHOS 400 (H) 05/25/2022   BILITOT 0.5 05/25/2022     RADIOGRAPHY: No results found.  IMPRESSION/PLAN: 48. 81 y.o. man with castrate-resistant prostate cancer with osseous metastasis***  Today, we talked to the patient and family about the findings and workup thus far. We  reviewed the natural history of prostate cancer and general treatment, highlighting the role of radiotherapy in the management of painful metastatic disease. We discussed the available radiation techniques, and focused on the details and logistics of delivery. In his case, the recommendation is for ***. We reviewed the anticipated acute and late sequelae associated with radiation in this setting. The patient was encouraged to ask questions that were answered to his satisfaction.   At the end of our conversation, ***   I personally spent *** minutes in this encounter including chart review, reviewing radiological studies, meeting face-to-face with the patient, entering orders and completing documentation.    Nicholos Johns, PA-C    Tyler Pita, MD  [facility] Direct Dial: [phone]  Fax: [phone] [email or web address]  Skype  [email or web address]   This document serves as a record of services personally performed by Tyler Pita, MD and Freeman Caldron, PA-C. It was created on their behalf by Wilburn Mylar, a trained medical scribe. The creation of this record is based on the scribe's personal observations and the provider's statements to them. This document has been checked and approved by the attending provider.

## 2022-06-14 NOTE — Progress Notes (Signed)
Histology and Location of Primary Cancer: Prostate Cancer with metastasis to bone  12/23/2020 Dr. Alen Blew NM Bone Scan Whole Body CLINICAL DATA:  Prostate cancer.  Assess treatment response.   FINDINGS: Bilateral renal function excretion. Focal area of increased activity noted over the lower pole of the left kidney. Multifocal areas of increased activity noted over the sternum. Increased intensity in midthoracic vertebral activity. Stable multifocal lumbar activity. Multiple subtle areas of increased activity noted over the ribs bilaterally. Focal area of increased activity over the right acetabulum. New focus of increased activity left mandible. Increased activity over both shoulders.   IMPRESSION: 1. Focal area of increased activity noted over the lower pole of the left kidney. This could be in a calyceal diverticulum. To exclude significant focal abnormality renal ultrasound can be obtained.   2. Multiple areas of increased activity noted over the sternum. Multiple new subtle areas of increased activity noted over the ribs bilaterally. Increased uptake in previously identified area of increased activity in midthoracic spine. Findings suggest progressive metastatic disease.   3. New area of increased activity left mandible. This could be related to dental disease. Metastatic disease cannot be excluded. Multifocal activity lumbar spine is stable.   4. Focal area of increased activity noted the right acetabulum. This could be degenerative. Metastatic disease cannot be excluded.   5.  Increased activity both shoulders, possibly degenerative.  02/18/2020 Dr. Alinda Money NM Bone Scan Whole Body CLINICAL DATA:  History of prostate cancer.   FINDINGS: Bilateral renal function excretion noted. Stable small focal area of increased activity again noted over the left kidney, possibly within a lower pole calyx. Stable lower lumbar focal areas of increased activity most likely  degenerative. New midthoracic focal area of increased activity. Thoracic spine series suggested for further evaluation. New punctate area of increased activity right posterior fourth rib. Right rib series suggested for further evaluation. Stable areas of increased activity noted over both first carpal metacarpal joints and knees, most likely degenerative.   IMPRESSION: 1. Stable lower lumbar focal areas of increased activity most likely degenerative.   2. New midthoracic focal area of increased activity. Thoracic spine series suggested for further evaluation.   3. New punctate area of increased activity right posterior fourth rib. Right rib series suggested for further evaluation.   Sites of Visceral and Bony Metastatic Disease: Sternum, bilateral ribs, right acetabulum, bilateral hips, and left mandible  Location(s) of Symptomatic Metastases:  Past/Anticipated chemotherapy by medical oncology, if any:   05/25/2022 Dr. Alen Blew  Plan: 1.  Castration-resistant advanced prostate cancer with disease to the bone diagnosed in June 2021.     His PSA continues to rise related to his advanced prostate cancer but he has declined additional therapy.  He is options including androgen receptor pathway inhibitors, systemic chemotherapy on others.  He understand that his disease will continue to progress but has opted against any additional treatment.  Transitioning to hospice would be an option in the future if he becomes more debilitated which she is not at this time.    2.   Androgen deprivation therapy: He is agreeable to continue with Eligard which she will receive today and repeated in 3 months.  Complications including weight gain and hot flashes among others were discussed.   3.  Bone directed therapy: He opted against Xgeva at this time.   4.  Prognosis and goals of care: Therapy is palliative and transitioning into hospice could be considered in the future.   5.  Bone pain:  It is  currently manageable with Tylenol.  This is related to advanced prostate cancer.  We have discussed the role for palliative radiation if he needs to in the future.    6.  Follow-up: He will return in 3 months for repeat evaluation and Eligard.   Pain on a scale of 0-10 is:  0/10 not at this moment "I take Oxycodone when I need it."   If Spine Met(s), symptoms, if any, include: Bowel/Bladder retention or incontinence (please describe):  No Numbness or weakness in extremities (please describe): Weakness in bilateral legs no numbness. Current Decadron regimen, if applicable:    Ambulatory status? Walker? Wheelchair?: Independent  SAFETY ISSUES:  Prior radiation? Yes, 2015: Definitive prostate radotherapy ([place], Dr. Irish Elders) Pacemaker/ICD? No Possible current pregnancy?  Male Is the patient on methotrexate? No  Current Complaints / other details:  No

## 2022-06-15 ENCOUNTER — Ambulatory Visit: Payer: Medicare HMO | Admitting: Radiation Oncology

## 2022-06-15 ENCOUNTER — Ambulatory Visit
Admission: RE | Admit: 2022-06-15 | Discharge: 2022-06-15 | Disposition: A | Discharge status: Home / Self Care | Payer: Medicare HMO | Source: Ambulatory Visit | Attending: Radiation Oncology | Admitting: Radiation Oncology

## 2022-06-15 VITALS — BP 152/84 | HR 84 | Temp 97.8°F | Resp 20 | Ht 70.0 in | Wt 160.0 lb

## 2022-06-15 DIAGNOSIS — C7951 Secondary malignant neoplasm of bone: Secondary | ICD-10-CM | POA: Insufficient documentation

## 2022-06-15 DIAGNOSIS — Z79899 Other long term (current) drug therapy: Secondary | ICD-10-CM | POA: Diagnosis not present

## 2022-06-15 DIAGNOSIS — Z87891 Personal history of nicotine dependence: Secondary | ICD-10-CM | POA: Diagnosis not present

## 2022-06-15 DIAGNOSIS — C61 Malignant neoplasm of prostate: Secondary | ICD-10-CM | POA: Diagnosis not present

## 2022-06-15 DIAGNOSIS — Z191 Hormone sensitive malignancy status: Secondary | ICD-10-CM | POA: Diagnosis not present

## 2022-06-15 DIAGNOSIS — I10 Essential (primary) hypertension: Secondary | ICD-10-CM | POA: Insufficient documentation

## 2022-06-15 DIAGNOSIS — Z923 Personal history of irradiation: Secondary | ICD-10-CM | POA: Diagnosis not present

## 2022-06-15 DIAGNOSIS — Z7902 Long term (current) use of antithrombotics/antiplatelets: Secondary | ICD-10-CM | POA: Diagnosis not present

## 2022-06-15 DIAGNOSIS — Z7985 Long-term (current) use of injectable non-insulin antidiabetic drugs: Secondary | ICD-10-CM | POA: Diagnosis not present

## 2022-06-15 DIAGNOSIS — M129 Arthropathy, unspecified: Secondary | ICD-10-CM | POA: Diagnosis not present

## 2022-06-15 DIAGNOSIS — E119 Type 2 diabetes mellitus without complications: Secondary | ICD-10-CM | POA: Insufficient documentation

## 2022-06-15 DIAGNOSIS — Z8673 Personal history of transient ischemic attack (TIA), and cerebral infarction without residual deficits: Secondary | ICD-10-CM | POA: Diagnosis not present

## 2022-06-30 ENCOUNTER — Encounter (HOSPITAL_BASED_OUTPATIENT_CLINIC_OR_DEPARTMENT_OTHER): Payer: Self-pay | Admitting: Cardiology

## 2022-06-30 ENCOUNTER — Ambulatory Visit (HOSPITAL_BASED_OUTPATIENT_CLINIC_OR_DEPARTMENT_OTHER): Payer: Medicare HMO | Admitting: Cardiology

## 2022-06-30 VITALS — BP 125/62 | HR 85 | Ht 70.0 in | Wt 162.5 lb

## 2022-06-30 DIAGNOSIS — I10 Essential (primary) hypertension: Secondary | ICD-10-CM | POA: Diagnosis not present

## 2022-06-30 DIAGNOSIS — I2510 Atherosclerotic heart disease of native coronary artery without angina pectoris: Secondary | ICD-10-CM | POA: Diagnosis not present

## 2022-06-30 DIAGNOSIS — E782 Mixed hyperlipidemia: Secondary | ICD-10-CM

## 2022-06-30 DIAGNOSIS — Z8673 Personal history of transient ischemic attack (TIA), and cerebral infarction without residual deficits: Secondary | ICD-10-CM | POA: Diagnosis not present

## 2022-06-30 DIAGNOSIS — E1121 Type 2 diabetes mellitus with diabetic nephropathy: Secondary | ICD-10-CM | POA: Diagnosis not present

## 2022-06-30 NOTE — Patient Instructions (Addendum)
Medication Instructions:  The current medical regimen is effective;  continue present plan and medications.  *If you need a refill on your cardiac medications before your next appointment, please call your pharmacy*   Lab Work: None  If you have labs (blood work) drawn today and your tests are completely normal, you will receive your results only by: [place] (if you have MyChart) OR A paper copy in the mail If you have any lab test that is abnormal or we need to change your treatment, we will call you to review the results.   Testing/Procedures: None   Follow-Up: At [facility], you and your health needs are our priority.  As part of our continuing mission to provide you with exceptional heart care, we have created designated Provider Care Teams.  These Care Teams include your primary Cardiologist (physician) and Advanced Practice Providers (APPs -  Physician Assistants and Nurse Practitioners) who all work together to provide you with the care you need, when you need it.  We recommend signing up for the patient portal called "MyChart".  Sign up information is provided on this After Visit Summary.  MyChart is used to connect with patients for Virtual Visits (Telemedicine).  Patients are able to view lab/test results, encounter notes, upcoming appointments, etc.  Non-urgent messages can be sent to your provider as well.   To learn more about what you can do with MyChart, go to [email or web address].    Your next appointment:   6 month(s)  The format for your next appointment:   In Person  Provider:   Buford Dresser, MD    Other Instructions Dr. Shelby Mattocks new office: [email or web address]

## 2022-06-30 NOTE — Progress Notes (Signed)
Cardiology Office Note:    Date:  06/30/2022   ID:  Wayne Perkins, DOB May 20, 1941, MRN [id number]  PCP:  [name], MD  Cardiologist:  Buford Dresser, MD  Referring MD: [name], MD   CC: follow up  History of Present Illness:    Wayne Perkins is a 81 y.o. adult with a hx of TIA/CVA, CAD s/p prior stents, hypertension, type II diabetes with nephropathy/CKD, hyperlipidemia, malignant prostate cancer who is seen for follow up today. I initially met him 11/28/20 as a new consult at the request of [name], MD for the evaluation and management of ASCVD and hypertension.  CV history: -cardiologist at [facility] in [place]. Had PCI in 2004 to "the left side" by Dr. Karrie Doffing at [facility] and brings angiogram pictures of his RCA pre/post PCI 2010 by Dr. Valrie Hart at [facility]  -Angina symptom in 2004 was bilateral hand numbness, felt like he was going to pass out. Lost consciousness, was defibrillated by EMS, urgently went to cath lab. In 2010, felt fatigued, couldn't eat. Felt heaviness in his chest. Called EMS, brought to hospital, had cath the day after admission.   On 07/03/2021 he called the office and reported intermittent chest pain for the past 6 months. He noted it felt like heartburn and happens mostly at night, worsening in the month prior.   At his last visit he reported chest discomfort/heartburn when he wakes up in the morning. After drinking milk his symptoms resolved within 10 minutes. He was managing well despite his LE weakness. He complained of occasional shortness of breath. He remained on tylenol due to his prostate cancer.  Today: He is accompanied by his wife. He continues to have heartburn every now and then resolved with drinking milk. At times he experiences a pulling or tightening type of chest pain that he attributes to his lipoma on his chest.  Additionally he is suffering from severe myalgias which he mostly attributes to mowing the  lawn on Monday. He believes that the jarring and bumping from mowing is causing severe, persistent pain as if he just came out of a football game. This aching pain is exacerbated when he tries to move. The other day he had significant right-sided chest pain that "felt like a deep bruise." He has a heating pad that will provide some relief while sitting in his chair. In the last 2 hours he has felt some relief from this pain. After mowing the lawn 3 weeks ago he "had 5 days of misery." Of note, prior to his past 2 times of mowing, his muscle aches would not be nearly as limiting as they are now.  He has prn oxycodone for severe pain when tylenol is ineffective. He has only needed to take oxycodone a few times, but never more than 1 tablet in a day.  In clinic today his blood pressure is elevated at 156/74. He would attribute this to his recent myalgias as described above. At home he usually sees readings around 135-145/60, and sometimes as low as 125/62.  He denies any palpitations, shortness of breath, or peripheral edema. No lightheadedness, headaches, syncope, orthopnea, or PND.   Past Medical History:  Diagnosis Date   Arthritis    Blood in stool    Childhood asthma    Diabetes mellitus without complication ([place])    type II   Heart disease    Hypertension, essential    Prostate cancer ([place])    Stroke ([place])  Past Surgical History:  Procedure Laterality Date   CORONARY ANGIOPLASTY WITH STENT PLACEMENT     GANGLION CYST EXCISION Right    PROSTATE BIOPSY      Current Medications: Current Outpatient Medications on File Prior to Visit  Medication Sig   Alcohol Swabs (B-D SINGLE USE SWABS REGULAR) PADS    Blood Glucose Calibration (ACCU-CHEK AVIVA) SOLN    blood glucose meter kit and supplies Use once a day in the AM for fasting sugars.   Calcium Carb-Cholecalciferol (OYSTER SHELL CALCIUM W/D) 500-5 MG-MCG TABS TAKE 1 TABLET TWICE DAILY   calcium-vitamin D (OSCAL WITH D) 500-200  MG-UNIT tablet Take 1 tablet by mouth 2 (two) times daily.   Cholecalciferol (VITAMIN D3) 50 MCG (2000 UT) TABS Take by mouth.   clobetasol (OLUX) 0.05 % topical foam Apply topically 2 (two) times daily.   clopidogrel (PLAVIX) 75 MG tablet TAKE 1 TABLET EVERY DAY   Dulaglutide (TRULICITY) 6.97 XY/8.0XK SOPN Inject 0.75 mg into the skin once a week.   Ez Smart Blood Glucose Lancets MISC by Does not apply route.   glucose blood test strip Use as instructed   hydrochlorothiazide (HYDRODIURIL) 25 MG tablet TAKE 1 TABLET EVERY DAY   hydrOXYzine (ATARAX) 10 MG tablet TAKE 1 TABLET BY MOUTH 3 TIMES DAILY AS NEEDED FOR ITCHING.   Multiple Vitamin (MULTI-DAY VITAMINS PO) Take by mouth.   Multiple Vitamins-Minerals (PRESERVISION AREDS PO) Take by mouth. Soft gels   NIFEdipine (PROCARDIA XL/NIFEDICAL-XL) 90 MG 24 hr tablet TAKE 1 TABLET EVERY DAY   oxyCODONE (OXY IR/ROXICODONE) 5 MG immediate release tablet Take 1 tablet (5 mg total) by mouth every 4 (four) hours as needed for severe pain.   polyethylene glycol (MIRALAX / GLYCOLAX) 17 g packet Take 17 g by mouth daily.   potassium chloride SA (KLOR-CON M) 20 MEQ tablet TAKE 1 TABLET TWICE DAILY   rosuvastatin (CRESTOR) 5 MG tablet TAKE 1 TABLET EVERY DAY   vitamin B-12 (CYANOCOBALAMIN) 1000 MCG tablet Take 1,000 mcg by mouth daily.   No current facility-administered medications on file prior to visit.     Allergies:   Tramadol, Glipizide, and Statins   Social History   Tobacco Use   Smoking status: Former    Types: Cigarettes   Smokeless tobacco: Never   Tobacco comments:    as a Network engineer Use: Never used  Substance Use Topics   Alcohol use: Not Currently   Drug use: Never    Family History: family history includes Depression in his brother; Early death in his brother; Heart disease in his brother; Hyperlipidemia in his mother and sister; Hypertension in his mother; Mental illness in his brother; Stroke in his father.  There is no history of Prostate cancer, Breast cancer, Colon cancer, or Pancreatic cancer.  ROS:   Please see the history of present illness.   (+) Pulling/Tight chest pain (+) Right chest pain/aches (+) Myalgias (+) Acid reflux Additional pertinent ROS otherwise unremarkable.  EKGs/Labs/Other Studies Reviewed:    The following studies were reviewed today:  Echo 08/01/19 1. Left ventricular ejection fraction, by visual estimation, is 60 to  65%. The left ventricle has normal function. There is mildly increased  left ventricular hypertrophy.   2. Left ventricular diastolic parameters are consistent with Grade I  diastolic dysfunction (impaired relaxation).   3. Global right ventricle has normal systolic function.The right  ventricular size is normal. No increase in right ventricular wall  thickness.  4. Left atrial size was normal.   5. Right atrial size was normal.   6. Presence of pericardial fat pad.   7. Mild aortic valve annular calcification.   8. The mitral valve is grossly normal. Mild mitral valve regurgitation.   9. The tricuspid valve is grossly normal. Tricuspid valve regurgitation  is mild.  10. The aortic valve is tricuspid. Aortic valve regurgitation is not  visualized.  11. The pulmonic valve was grossly normal. Pulmonic valve regurgitation is  trivial.  12. The ascending aorta was not well visualized.  13. TR signal is inadequate for assessing pulmonary artery systolic  pressure.  14. The inferior vena cava is normal in size with greater than 50%  respiratory variability, suggesting right atrial pressure of 3 mmHg.   Bilateral Carotid Dopplers 07/31/2019:   RIGHT CAROTID ARTERY: Mild amount of calcified plaque present at the level of the carotid bulb and proximal right ICA. Estimated right ICA stenosis is less than 50%.   RIGHT VERTEBRAL ARTERY: Antegrade flow with normal waveform and velocity.   LEFT CAROTID ARTERY: Minimal partially calcified plaque  at the level of the distal bulb and proximal left ICA. Estimated left ICA stenosis is less than 50%.   LEFT VERTEBRAL ARTERY: Antegrade flow with normal waveform and velocity.   IMPRESSION: Mild plaque at the level of both carotid bulbs and proximal internal carotid arteries. No significant carotid stenosis identified with estimated bilateral ICA stenoses of less than 50%.  EKG:  EKG is personally reviewed.   06/30/2022: EKG was not ordered. 10/23/2021: NSR at 81 bpm 12/04/2020: sinus rhythm with 1st degree AV block, 82 bpm  Recent Labs: 05/05/2022: TSH 1.60 05/25/2022: ALT 12; BUN 40; Creatinine 1.57; Hemoglobin 9.7; Platelet Count 238; Potassium 3.8; Sodium 136   Recent Lipid Panel    Component Value Date/Time   CHOL 180 05/05/2022 0855   TRIG 172.0 (H) 05/05/2022 0855   HDL 51.40 05/05/2022 0855   CHOLHDL 3 05/05/2022 0855   VLDL 34.4 05/05/2022 0855   LDLCALC 94 05/05/2022 0855   LDLCALC 159 (H) 08/15/2020 1123   LDLDIRECT 93.0 10/30/2021 1321    Physical Exam:    VS:  BP 125/62 Comment: home  Pulse 85   Ht 5' 10" (1.778 m)   Wt 162 lb 8 oz (73.7 kg)   SpO2 98%   BMI 23.32 kg/m     Wt Readings from Last 3 Encounters:  06/30/22 162 lb 8 oz (73.7 kg)  06/15/22 160 lb (72.6 kg)  06/03/22 162 lb (73.5 kg)    GEN: Well nourished, well developed in no acute distress HEENT: Normal, moist mucous membranes NECK: No JVD CARDIAC: regular rhythm, normal S1 and S2, no rubs or gallops. No murmur. VASCULAR: Radial and DP pulses 2+ bilaterally. No carotid bruits RESPIRATORY:  Clear to auscultation without rales, wheezing or rhonchi  ABDOMEN: Soft, non-tender, non-distended MUSCULOSKELETAL:  Ambulates independently SKIN: Warm and dry, no edema NEUROLOGIC:  Alert and oriented x 3. No focal neuro deficits noted. PSYCHIATRIC:  Normal affect    ASSESSMENT:    1. History of CVA (cerebrovascular accident)   2. Coronary artery disease involving native coronary artery of native  heart without angina pectoris   3. Type 2 diabetes with nephropathy ([place])   4. Hypertension, essential   5. Mixed hyperlipidemia      PLAN:    History of TIA/CVA History of CAD with prior stents Mixed hyperlipidemia Type II diabetes, with nephropathy Hypertension -does not tolerate beta blockers -  continue clopidogrel -tolerating low dose rosuvastatin. Labs from 05/05/22 show LDL 94, TG 172 -continue dulaglutide given diabetes and ASCVD. SGLT2i would be another good option given ASCVD and diabetic nephropathy, could likely discontinue HCTZ with that.  -BP above goal today, but in pain. Reports good control at home -Tolerates nifedipine. Would consider ARB given diabetic nephropathy, he wishes to continue current medications for now  Cardiac risk counseling and prevention recommendations: -recommend heart healthy/Mediterranean diet, with whole grains, fruits, vegetable, fish, lean meats, nuts, and olive oil. Limit salt. -recommend moderate walking, 3-5 times/week for 30-50 minutes each session. Aim for at least 150 minutes.week. Goal should be pace of 3 miles/hours, or walking 1.5 miles in 30 minutes -recommend avoidance of tobacco products. Avoid excess alcohol.  Plan for follow up: 6 mos or sooner as needed  Buford Dresser, MD, PhD, [facility]    Medication Adjustments/Labs and Tests Ordered: Current medicines are reviewed at length with the patient today.  Concerns regarding medicines are outlined above.   No orders of the defined types were placed in this encounter.  No orders of the defined types were placed in this encounter.  Patient Instructions  Medication Instructions:  The current medical regimen is effective;  continue present plan and medications.  *If you need a refill on your cardiac medications before your next appointment, please call your pharmacy*   Lab Work: None  If you have labs (blood work) drawn today and your tests are  completely normal, you will receive your results only by: [place] (if you have MyChart) OR A paper copy in the mail If you have any lab test that is abnormal or we need to change your treatment, we will call you to review the results.   Testing/Procedures: None   Follow-Up: At [facility], you and your health needs are our priority.  As part of our continuing mission to provide you with exceptional heart care, we have created designated Provider Care Teams.  These Care Teams include your primary Cardiologist (physician) and Advanced Practice Providers (APPs -  Physician Assistants and Nurse Practitioners) who all work together to provide you with the care you need, when you need it.  We recommend signing up for the patient portal called "MyChart".  Sign up information is provided on this After Visit Summary.  MyChart is used to connect with patients for Virtual Visits (Telemedicine).  Patients are able to view lab/test results, encounter notes, upcoming appointments, etc.  Non-urgent messages can be sent to your provider as well.   To learn more about what you can do with MyChart, go to [email or web address].    Your next appointment:   6 month(s)  The format for your next appointment:   In Person  Provider:   Buford Dresser, MD    Other Instructions Dr. Shelby Mattocks new office: [email or web address]   I,Mathew Stumpf,acting as a scribe for Buford Dresser, MD.,have documented all relevant documentation on the behalf of Buford Dresser, MD,as directed by  Buford Dresser, MD while in the presence of Buford Dresser, MD.  I, Buford Dresser, MD, have reviewed all documentation for this visit. The documentation on 06/30/22 for the exam, diagnosis, procedures, and orders are all accurate and complete.   Signed, Buford Dresser, MD PhD 06/30/2022     [place]

## 2022-07-02 ENCOUNTER — Telehealth: Payer: Self-pay | Admitting: Cardiology

## 2022-07-02 NOTE — Telephone Encounter (Signed)
Spoke to patient and he was to report to Dr Harrell Gave today how he was feeling  Stated he is feeling more like himself Muscle aches improved, much better Decreased energy, otherwise feels good  If Dr Harrell Gave would like to call, he will be around   Will forward to Dr Harrell Gave for review

## 2022-07-02 NOTE — Telephone Encounter (Signed)
Patient returned call

## 2022-07-02 NOTE — Telephone Encounter (Signed)
Left message to call back  

## 2022-07-02 NOTE — Telephone Encounter (Signed)
Patient called to talk with Dr. Harrell Gave or nurse. Please call back

## 2022-07-08 ENCOUNTER — Telehealth: Payer: Self-pay | Admitting: *Deleted

## 2022-07-08 NOTE — Telephone Encounter (Signed)
CALLED PATIENT'S WIFE(Russel Morain) TO INFORM OF PSMA SCAN FOR 07-23-22- ARRIVAL TIME- 1 PM @ [facility], NO RESTRICTIONS TO TEST, AND TELEPHONE FU WITH ASHLYN BRUNING FOR RESULTS ON 07-29-22 @ 2:30 PM, LVM FOR A RETURN CALL

## 2022-07-19 ENCOUNTER — Telehealth: Payer: Self-pay | Admitting: *Deleted

## 2022-07-19 NOTE — Telephone Encounter (Signed)
Returned patient's phone call, lvm for a return call 

## 2022-07-23 ENCOUNTER — Encounter (HOSPITAL_COMMUNITY): Payer: Medicare HMO

## 2022-07-28 ENCOUNTER — Telehealth: Payer: Self-pay | Admitting: *Deleted

## 2022-07-28 NOTE — Telephone Encounter (Signed)
CALLED PATIENT TO INFORM OF PSMA SCAN ON 08-09-22- ARRIVAL TIME- 2 PM @ [facility], NO RESTRICTIONS TO TEST, PATIENT TO RECEIVE RESULTS FROM ASHLYN BRUNING ON 08-11-22 @ 10 AM FOR RESULTS  VIA TELEPHONE , SPOKE WITH PATIENT AND HE IS AWARE OF THESE APPTS. AND THE INSTRUCTIONS

## 2022-07-29 ENCOUNTER — Ambulatory Visit: Payer: Self-pay | Admitting: Urology

## 2022-07-29 ENCOUNTER — Encounter (HOSPITAL_BASED_OUTPATIENT_CLINIC_OR_DEPARTMENT_OTHER): Payer: Self-pay | Admitting: Cardiology

## 2022-08-03 ENCOUNTER — Telehealth: Payer: Self-pay | Admitting: *Deleted

## 2022-08-03 ENCOUNTER — Other Ambulatory Visit: Payer: Self-pay | Admitting: Oncology

## 2022-08-03 MED ORDER — OXYCODONE HCL 5 MG PO TABS: 5.0000 mg | ORAL_TABLET | ORAL | 0 refills | Status: DC | PRN

## 2022-08-03 NOTE — Telephone Encounter (Signed)
Wayne Perkins needs a refill of Oxycodone sent to [facility] in [place]

## 2022-08-03 NOTE — Telephone Encounter (Signed)
RETURNED PATIENT'S PHONE CALL, SPOKE WITH PATIENT. ?

## 2022-08-05 NOTE — Telephone Encounter (Signed)
Pt scheduled appt Monday 08/09/2022 at 10:55.  Pt aware and agreeable. Georgana Curio MHA RN CCM

## 2022-08-05 NOTE — Telephone Encounter (Signed)
Returned call to patient who reports having "chest heaviness" while moving wood approximately one week ago.  Pt states the sensation resolved itself a he rested.  Pt states since this event he hasn't felt like himself.  States he feels fatigued, too fatigued to take a deep breath.  States he is lacking energy.  He denies ongoing chest discomfort, shortness of breath, diaphoresis or nausea with the episode or since the episode. Pt thinks he needs to be seen, however he agreed to a Probation officer sending a message to Dr. Harrell Gave for her input. He did have a Bp with reading of 120's/50's.

## 2022-08-05 NOTE — Telephone Encounter (Signed)
Pt c/o of Chest Pain: STAT if CP now or developed within 24 hours  1. Are you having CP right now?  No, patient denies pain. Chest tightness occurred about 1 week ago while exerting himself  2. Are you experiencing any other symptoms (ex. SOB, nausea, vomiting, sweating)?  Tiredness   3. How long have you been experiencing CP?  About 1 week ago   4. Is your CP continuous or coming and going?  Only occurred once   5. Have you taken Nitroglycerin?   No

## 2022-08-09 ENCOUNTER — Encounter (HOSPITAL_COMMUNITY)
Admission: RE | Admit: 2022-08-09 | Discharge: 2022-08-09 | Disposition: A | Discharge status: Home / Self Care | Payer: Medicare HMO | Source: Ambulatory Visit | Attending: Urology | Admitting: Urology

## 2022-08-09 ENCOUNTER — Ambulatory Visit (HOSPITAL_BASED_OUTPATIENT_CLINIC_OR_DEPARTMENT_OTHER): Payer: Medicare HMO | Admitting: Family

## 2022-08-09 ENCOUNTER — Encounter (HOSPITAL_BASED_OUTPATIENT_CLINIC_OR_DEPARTMENT_OTHER): Payer: Self-pay | Admitting: Family

## 2022-08-09 VITALS — BP 134/70 | HR 92 | Ht 70.0 in | Wt 163.0 lb

## 2022-08-09 DIAGNOSIS — I25118 Atherosclerotic heart disease of native coronary artery with other forms of angina pectoris: Secondary | ICD-10-CM | POA: Diagnosis not present

## 2022-08-09 DIAGNOSIS — C61 Malignant neoplasm of prostate: Secondary | ICD-10-CM | POA: Diagnosis not present

## 2022-08-09 DIAGNOSIS — E785 Hyperlipidemia, unspecified: Secondary | ICD-10-CM

## 2022-08-09 DIAGNOSIS — C7951 Secondary malignant neoplasm of bone: Secondary | ICD-10-CM | POA: Diagnosis not present

## 2022-08-09 DIAGNOSIS — E1121 Type 2 diabetes mellitus with diabetic nephropathy: Secondary | ICD-10-CM | POA: Diagnosis not present

## 2022-08-09 DIAGNOSIS — C781 Secondary malignant neoplasm of mediastinum: Secondary | ICD-10-CM | POA: Diagnosis not present

## 2022-08-09 DIAGNOSIS — Z8673 Personal history of transient ischemic attack (TIA), and cerebral infarction without residual deficits: Secondary | ICD-10-CM

## 2022-08-09 MED ORDER — NITROGLYCERIN 0.4 MG SL SUBL: 0.4000 mg | SUBLINGUAL_TABLET | SUBLINGUAL | 3 refills | Status: DC | PRN

## 2022-08-09 MED ORDER — PIFLIFOLASTAT F 18 (PYLARIFY) INJECTION
9.0000 | Freq: Once | INTRAVENOUS | Status: AC
  Administered 2022-08-09: 9.78 via INTRAVENOUS

## 2022-08-09 MED ORDER — ISOSORBIDE MONONITRATE ER 30 MG PO TB24: 30.0000 mg | ORAL_TABLET | Freq: Every day | ORAL | 2 refills | Status: DC

## 2022-08-09 NOTE — Progress Notes (Signed)
Office Visit    Patient Name: Wayne Perkins Date of Encounter: 08/09/2022  PCP:  [name], MD   [place]  Cardiologist:  Buford Dresser, MD  Advanced Practice Provider:  No care team member to display Electrophysiologist:  None      Chief Complaint    Wayne Perkins is a 81 y.o. adult presents today for chest pain   Past Medical History    Past Medical History:  Diagnosis Date   Arthritis    Blood in stool    Childhood asthma    Diabetes mellitus without complication ([place])    type II   Heart disease    Hypertension, essential    Prostate cancer ([place])    Stroke [facility])    Past Surgical History:  Procedure Laterality Date   CORONARY ANGIOPLASTY WITH STENT PLACEMENT     GANGLION CYST EXCISION Right    PROSTATE BIOPSY      Allergies  Allergies  Allergen Reactions   Tramadol Other (See Comments)    Muscle pains and sensation of needles in muscles    Glipizide Itching   Statins Other (See Comments)    Causes narcolepsy    History of Present Illness    Wayne Perkins is a 81 y.o. adult with a hx of TIA/CVA, CAd s/p prior stents, left chest wall lipoma, HTN, DM2 with nephropathy/CKD, HLD, malignant prostate cancer last seen 06/30/22 by Dr. Harrell Gave.  Prior PCI in 2004 to "the left side" per his report by Dr. Karrie Doffing at [facility]. Anginal equivalent of bilateral hand numbness, loss of consciousness. In 2010 had PCI=RCA by Dr. Valrie Hart at [place] in [place].  Last seen 06/30/22 noting heartburn resolved with mild. Occasional episode of chest pain which he attributed to lipoma with sensation of "pulling". He also noted myalgias. BP was elevated which he attributed to pain and medication changes deferred.  Called 08/05/22 with chest pain a week prior while exerting himself.   He presents today with his wife. 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 While loading up  the truck felt a "heavy pressure". After loading he sat down and relaxed and it passed after 10 minutes. Since that time tells me he does not have to do much exertional around the house without noting the "tinge" of pressure. It is mostly left side. It feels different than his previous "pulling" with his lipoma. Occasional lightheadedness - he does eat three meals per day and drinks [place], water. In reviewing his anginal equivalent, at time of first PCI felt some heaviness and at time of second PCI felt something different and felt something wasn't right.   Previous antihypertensives Beta blockers - intolerance  EKGs/Labs/Other Studies Reviewed:   The following studies were reviewed today:   EKG:  EKG is  ordered today.  The ekg ordered today demonstrates NSR 92 bpm with no acute ST/T wave changes.   Recent Labs: 05/05/2022: TSH 1.60 05/25/2022: ALT 12; BUN 40; Creatinine 1.57; Hemoglobin 9.7; Platelet Count 238; Potassium 3.8; Sodium 136  Recent Lipid Panel    Component Value Date/Time   CHOL 180 05/05/2022 0855   TRIG 172.0 (H) 05/05/2022 0855   HDL 51.40 05/05/2022 0855   CHOLHDL 3 05/05/2022 0855   VLDL 34.4 05/05/2022 0855   LDLCALC 94 05/05/2022 0855   LDLCALC 159 (H) 08/15/2020 1123   LDLDIRECT 93.0 10/30/2021 1321    Home Medications  Current Meds  Medication Sig   Alcohol Swabs (B-D SINGLE USE SWABS REGULAR) PADS    Blood Glucose Calibration (ACCU-CHEK AVIVA) SOLN    blood glucose meter kit and supplies Use once a day in the AM for fasting sugars.   Calcium Carb-Cholecalciferol (OYSTER SHELL CALCIUM W/D) 500-5 MG-MCG TABS TAKE 1 TABLET TWICE DAILY   calcium-vitamin D (OSCAL WITH D) 500-200 MG-UNIT tablet Take 1 tablet by mouth 2 (two) times daily.   Cholecalciferol (VITAMIN D3) 50 MCG (2000 UT) TABS Take by mouth.   clobetasol (OLUX) 0.05 % topical foam Apply topically 2 (two) times daily.   clopidogrel (PLAVIX) 75 MG tablet TAKE 1 TABLET EVERY DAY   Dulaglutide  (TRULICITY) 6.65 LD/3.5TS SOPN Inject 0.75 mg into the skin once a week.   Ez Smart Blood Glucose Lancets MISC by Does not apply route.   glucose blood test strip Use as instructed   hydrochlorothiazide (HYDRODIURIL) 25 MG tablet TAKE 1 TABLET EVERY DAY   hydrOXYzine (ATARAX) 10 MG tablet TAKE 1 TABLET BY MOUTH 3 TIMES DAILY AS NEEDED FOR ITCHING.   isosorbide mononitrate (IMDUR) 30 MG 24 hr tablet Take 1 tablet (30 mg total) by mouth daily.   Multiple Vitamin (MULTI-DAY VITAMINS PO) Take by mouth.   Multiple Vitamins-Minerals (PRESERVISION AREDS PO) Take by mouth. Soft gels   NIFEdipine (PROCARDIA XL/NIFEDICAL-XL) 90 MG 24 hr tablet TAKE 1 TABLET EVERY DAY   nitroGLYCERIN (NITROSTAT) 0.4 MG SL tablet Place 1 tablet (0.4 mg total) under the tongue every 5 (five) minutes as needed for chest pain.   oxyCODONE (OXY IR/ROXICODONE) 5 MG immediate release tablet Take 1 tablet (5 mg total) by mouth every 4 (four) hours as needed for severe pain.   polyethylene glycol (MIRALAX / GLYCOLAX) 17 g packet Take 17 g by mouth daily.   potassium chloride SA (KLOR-CON M) 20 MEQ tablet TAKE 1 TABLET TWICE DAILY   rosuvastatin (CRESTOR) 5 MG tablet TAKE 1 TABLET EVERY DAY   vitamin B-12 (CYANOCOBALAMIN) 1000 MCG tablet Take 1,000 mcg by mouth daily.    Review of Systems      All other systems reviewed and are otherwise negative except as noted above.  Physical Exam    VS:  BP 134/70   Pulse 92   Ht _0  (1.778 m)   Wt 163 lb (73.9 kg)   BMI 23.39 kg/m  , BMI Body mass index is 23.39 kg/m.  Wt Readings from Last 3 Encounters:  08/09/22 163 lb (73.9 kg)  06/30/22 162 lb 8 oz (73.7 kg)  06/15/22 160 lb (72.6 kg)    GEN: Well nourished, well developed, in no acute distress. HEENT: normal. Neck: Supple, no JVD, carotid bruits, or masses. Cardiac: RRR, no murmurs, rubs, or gallops. No clubbing, cyanosis, edema.  Radials/PT 2+ and equal bilaterally.  Respiratory:  Respirations regular and unlabored,  clear to auscultation bilaterally. GI: Soft, nontender, nondistended. MS: No deformity or atrophy. Skin: Warm and dry, no rash. Neuro:  Strength and sensation are intact. Psych: Normal affect.  Assessment & Plan    CAD - Prior PCI 2004 and 2010 in [place]. EKG today NSR with no acute ST/T wave changes. Notes some exercise intolerance and mild left sided chest discomfort while lifting wood out of vehicle. Chest wall nontender on exam. Mixed typical and atypical features for angina. Consider etiology musculoskeletal vs deconditioning vs angina. Discussed medical changes vs lexiscan myoview vs cardiac PET. Mutually agreed to start Imdur 58m QHS and follow up  in one week to reassess symptoms. If still with persistent discomfort, plan for PET vs myoview. Discussed with Dr. Harrell Gave in clinic.   HLD, LDL goal <70 -continue rosuvastatin 5 mg daily.  Hx of CVA - Continue Plavix, Rosuvastatin.  DM2 - Continue to follow with PCP.          Disposition: Follow up in 1 week(s) with Buford Dresser, MD or APP.  Signed, Loel Dubonnet, NP 08/09/2022, 1:17 PM [facility]

## 2022-08-09 NOTE — Patient Instructions (Addendum)
Medication Instructions:  Your physician has recommended you make the following change in your medication:    START Isosorbide Mononitrate (Imdur) one 30 mg tablet every evening  START Nitroglycerin as needed for chest pain   For as needed Nitroglycerin, if you develop chest pain: Sit and rest 5 minutes. If chest pain does not resolve place 1 nitroglycerin under your tongue and wait 5 minutes. If chest pain does not resolve, place a 2nd nitroglycerin under your tongue and wait 5 more minutes. If chest pain does not resolve, place a 3rd nitroglycerin under your tongue and seek emergency services.   *If you need a refill on your cardiac medications before your next appointment, please call your pharmacy*   Lab Work:/Testing/Procedures: None ordered today.   If your chest pain does not improve with the medication we may consider a stress test or a cardiac PET.  Follow-Up: At [facility], you and your health needs are our priority.  As part of our continuing mission to provide you with exceptional heart care, we have created designated Provider Care Teams.  These Care Teams include your primary Cardiologist (physician) and Advanced Practice Providers (APPs -  Physician Assistants and Nurse Practitioners) who all work together to provide you with the care you need, when you need it.  We recommend signing up for the patient portal called "MyChart".  Sign up information is provided on this After Visit Summary.  MyChart is used to connect with patients for Virtual Visits (Telemedicine).  Patients are able to view lab/test results, encounter notes, upcoming appointments, etc.  Non-urgent messages can be sent to your provider as well.   To learn more about what you can do with MyChart, go to [email or web address].    Your next appointment:   As scheduled with Loel Dubonnet, NP in one week  Other Instructions  Isosorbide Mononitrate Extended-Release Tablets What is this  medication? ISOSORBIDE MONONITRATE (eye soe SOR bide mon oh NYE trate) prevents chest pain (angina). It works by relaxing blood vessels, which decreases the amount of work the heart has to do. It belongs to a group of medications called nitrates. Do not use it to treat sudden chest pain. This medicine may be used for other purposes; ask your health care provider or pharmacist if you have questions. COMMON BRAND NAME(S): Imdur, Isotrate ER What should I tell my care team before I take this medication? They need to know if you have any of these conditions: Previous heart attack or heart failure An unusual or allergic reaction to isosorbide mononitrate, nitrates, other medications, foods, dyes, or preservatives Pregnant or trying to get pregnant Breast-feeding How should I use this medication? Take this medication by mouth with a glass of water. Follow the directions on the prescription label. Do not crush or chew. Take your medication at regular intervals. Do not take your medication more often than directed. Do not stop taking this medication except on the advice of your care team. Talk to your care team about the use of this medication in children. Special care may be needed. Overdosage: If you think you have taken too much of this medicine contact a poison control center or emergency room at once. NOTE: This medicine is only for you. Do not share this medicine with others. What if I miss a dose? If you miss a dose, take it as soon as you can. If it is almost time for your next dose, take only that dose. Do not take double or extra  doses. What may interact with this medication? Do not take this medication with any of the following: Certain medications for erectile dysfunction (ED), such as avanafil, sildenafil, tadalafil, or vardenafil Riociguat This medication may also interact with the following: Medications for high blood pressure Other medications for angina or heart failure This list may  not describe all possible interactions. Give your health care provider a list of all the medicines, herbs, non-prescription drugs, or dietary supplements you use. Also tell them if you smoke, drink alcohol, or use illegal drugs. Some items may interact with your medicine. What should I watch for while using this medication? Visit your care team for regular checks on your progress. Check your heart rate and blood pressure as directed. Know what your heart rate and blood pressure should be and when to contact your care team. Tell your care team if you feel your medication is no longer working. This medication may affect your coordination, reaction time, or judgment. Do not drive or operate machinery until you know how this medication affects you. Sit up or stand slowly to reduce the risk of dizzy or fainting spells. Drinking alcohol with this medication can increase the risk of these side effects Do not treat yourself for coughs, colds, or pain while you are taking this medication without asking your care team for advice. Some medications may increase your blood pressure. What side effects may I notice from receiving this medication? Side effects that you should report to your care team as soon as possible: Allergic reactions--skin rash, itching, hives, swelling of the face, lips, tongue, or throat Headache, unusual weakness or fatigue, shortness of breath, nausea, vomiting, rapid heartbeat, blue skin or lips, which may be signs of methemoglobinemia Increased pressure around the brain--severe headache, blurry vision, change in vision, nausea, vomiting Low blood pressure--dizziness, feeling faint or lightheaded, blurry vision Slow heartbeat--dizziness, feeling faint or lightheaded, confusion, trouble breathing, unusual weakness or fatigue Worsening chest pain (angina)--pain, pressure, or tightness in the chest, neck, back, or arms Side effects that usually do not require medical attention (report to your  care team if they continue or are bothersome): Dizziness Flushing Headache This list may not describe all possible side effects. Call your doctor for medical advice about side effects. You may report side effects to FDA at 1-800-FDA-1088. Where should I keep my medication? Keep out of the reach of children. Store between 15 and 30 degrees C (59 and 86 degrees F). Keep container tightly closed. Throw away any unused medication after the expiration date. NOTE: This sheet is a summary. It may not cover all possible information. If you have questions about this medicine, talk to your doctor, pharmacist, or health care provider.  2023 Elsevier/Gold Standard (2020-12-01 00:00:00)

## 2022-08-10 ENCOUNTER — Encounter: Payer: Self-pay | Admitting: Urology

## 2022-08-10 NOTE — Progress Notes (Addendum)
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 Patient is to receive most recent scan results from 08/09/22 per Ashlyn Bruning PA-C. I verified patient's identity and began nursing interview. Patient reports general, overall joint pain 4/10, managed w/ ibuprofen and oxycodone. No other issues reported at this time.   Meaningful use complete. I-PSS score of 8-mild. No urinary management medications. Urology appt- None currently w/ Dr. Alinda Money at [facility].   Patient aware of their 10:00am-08/11/22 telephone appointment w/ Ashlyn Bruning PA-C. I left my extension [phone] in case patient needs anything. Patient verbalized understanding. This concludes the nursing interview.   Patient contact [phone]     Leandra Kern, LPN

## 2022-08-11 ENCOUNTER — Ambulatory Visit
Admission: RE | Admit: 2022-08-11 | Discharge: 2022-08-11 | Disposition: A | Discharge status: Home / Self Care | Payer: Medicare HMO | Source: Ambulatory Visit | Attending: Urology | Admitting: Urology

## 2022-08-11 DIAGNOSIS — C7951 Secondary malignant neoplasm of bone: Secondary | ICD-10-CM | POA: Diagnosis not present

## 2022-08-11 DIAGNOSIS — Z191 Hormone sensitive malignancy status: Secondary | ICD-10-CM | POA: Diagnosis not present

## 2022-08-11 DIAGNOSIS — C61 Malignant neoplasm of prostate: Secondary | ICD-10-CM

## 2022-08-11 NOTE — Progress Notes (Signed)
Radiation Oncology         [phone] ________________________________  Telephone Follow up visit- The patient was notified in advance and gave permission to proceed with this visit format.  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    ICD-10-CM   1. Malignant neoplasm of prostate metastatic to bone [facility])  C61    C79.51       HISTORY OF PRESENT ILLNESS: Wayne Perkins is a 81 y.o. adult recently seen on 06/15/22, at the request of Dr. Alen Blew. We had previously met this patient in our multidisciplinary prostate clinic on 02/19/20. In summary, he was diagnosed with low volume Gleason 3+3 prostate cancer in 2012. He proceeded with surveillance until 2015, when he elected to proceed with IMRT while living in [place]. Sometime thereafter, he received 9 months of ADT for a rising PSA and questionable left ischial lesion, but he discontinued due to side effects including hot flashes, weight gain, and development of diabetes requiring treatment with metformin. His PSA rose again in 2019, though staging scans in 12/2018 were negative. When his PSA reached 24.7 in 02/2019, he resumed ADT through 08/2019. Restaging CT A/P and bone scan in 01/2020 revealed new bone lesions in the midthoracic spine and right posterior 4th rib. Given he was asymptomatic at that time, the recommendation was to resume ADT and add Xtandi.  He started Xtandi under the care of Dr. Alen Blew in 01/2020 but discontinued after 3 weeks due to poor tolerance. He then tried [place] in 12/2020 but also discontinued this shortly thereafter. He resumed ADT on 02/29/20, which he continues on currently, every 3 months. Despite this, his PSA has continued to rise, reaching 303 on most recent labs from  05/25/22.  He has been experiencing intermittent bone pain in his lower back and legs and has declined additional systemic therapies, as well as bone directed therapy.  He had not had any recent bone imaging since April 2022 so we recommended updating his disease staging with PSMA PET scan.  Currently, his pain remains controlled with Tylenol and only occasionally any need for narcotic pain medication if he has been particularly active in his shop/garage.  His PSMA PET scan was performed 08/09/22 and shows significant progression of disease with diffuse sclerotic bone metastases, showing intense radiotracer accumulation throughout the axial and appendicular skeleton as well as mild metastatic lymphadenopathy in the mediastinum, right retrocrural region, abdominal retroperitoneum, and right common and external iliac chains. We reviewed these results by telephone today.  PREVIOUS RADIATION THERAPY: Yes  2015: Definitive prostate radotherapy ([place], Dr. Irish Elders)  PAST MEDICAL HISTORY:  Past Medical History:  Diagnosis Date   Arthritis    Blood in stool    Childhood asthma    Diabetes mellitus without complication ([place])    type II   Heart disease    Hypertension, essential    Prostate cancer ([place])    Stroke ([place])       PAST SURGICAL HISTORY: Past Surgical History:  Procedure Laterality Date   CORONARY ANGIOPLASTY WITH STENT PLACEMENT     GANGLION CYST EXCISION Right    PROSTATE BIOPSY      FAMILY HISTORY:  Family History  Problem Relation Age of Onset   Hyperlipidemia Mother    Hypertension  Mother    Stroke Father    Hyperlipidemia Sister    Depression Brother    Early death Brother    Heart disease Brother    Mental illness Brother    Prostate cancer Neg Hx    Breast cancer Neg Hx    Colon cancer Neg Hx    Pancreatic cancer Neg Hx     SOCIAL HISTORY:  Social History   Socioeconomic History   Marital status: Married    Spouse name: Enid Derry   Number of children: 3   Years of  education: Not on file   Highest education level: Not on file  Occupational History    Comment: retired  Tobacco Use   Smoking status: Former    Types: Cigarettes   Smokeless tobacco: Never   Tobacco comments:    as a Network engineer Use: Never used  Substance and Sexual Activity   Alcohol use: Not Currently   Drug use: Never   Sexual activity: Yes    Partners: Female  Other Topics Concern   Not on file  Social History Narrative   Not on file   Social Determinants of Health   Financial Resource Strain: [place]  (06/03/2022)   Overall Financial Resource Strain (CARDIA)    Difficulty of Paying Living Expenses: Not hard at all  Food Insecurity: No Food Insecurity (06/03/2022)   Hunger Vital Sign    Worried About Running Out of Food in the Last Year: Never true    [place] in the Last Year: Never true  Transportation Needs: No Transportation Needs (06/03/2022)   PRAPARE - Hydrologist (Medical): No    Lack of Transportation (Non-Medical): No  Physical Activity: Insufficiently Active (06/03/2022)   Exercise Vital Sign    Days of Exercise per Week: 3 days    Minutes of Exercise per Session: 30 min  Stress: No Stress Concern Present (06/03/2022)   [place]    Feeling of Stress : Not at all  Social Connections: Moderately Integrated (06/03/2022)   Social Connection and Isolation Panel [NHANES]    Frequency of Communication with Friends and Family: More than three times a week    Frequency of Social Gatherings with Friends and Family: More than three times a week    Attends Religious Services: More than 4 times per year    Active Member of [employer] or Organizations: No    Attends Archivist Meetings: Never    Marital Status: Married  Human resources officer Violence: Not At Risk (06/03/2022)   Humiliation, Afraid, Rape, and Kick questionnaire    Fear of Current or  Ex-Partner: No    Emotionally Abused: No    Physically Abused: No    Sexually Abused: No    ALLERGIES: Tramadol, Glipizide, and Statins  MEDICATIONS:  Current Outpatient Medications  Medication Sig Dispense Refill   Alcohol Swabs (B-D SINGLE USE SWABS REGULAR) PADS      Blood Glucose Calibration (ACCU-CHEK AVIVA) SOLN      blood glucose meter kit and supplies Use once a day in the AM for fasting sugars. 1 each 0   Calcium Carb-Cholecalciferol (OYSTER SHELL CALCIUM W/D) 500-5 MG-MCG TABS TAKE 1 TABLET TWICE DAILY 180 tablet 10   calcium-vitamin D (OSCAL WITH D) 500-200 MG-UNIT tablet Take 1 tablet by mouth 2 (two) times daily. 60 tablet 3   Cholecalciferol (VITAMIN D3) 50 MCG (2000  UT) TABS Take by mouth.     clobetasol (OLUX) 0.05 % topical foam Apply topically 2 (two) times daily. 100 g 2   clopidogrel (PLAVIX) 75 MG tablet TAKE 1 TABLET EVERY DAY 90 tablet 1   Dulaglutide (TRULICITY) 3.79 KW/4.0XB SOPN Inject 0.75 mg into the skin once a week. 4 pen 0   Ez Smart Blood Glucose Lancets MISC by Does not apply route. 100 each 3   glucose blood test strip Use as instructed 100 each 3   hydrochlorothiazide (HYDRODIURIL) 25 MG tablet TAKE 1 TABLET EVERY DAY 90 tablet 0   hydrOXYzine (ATARAX) 10 MG tablet TAKE 1 TABLET BY MOUTH 3 TIMES DAILY AS NEEDED FOR ITCHING. 270 tablet 1   isosorbide mononitrate (IMDUR) 30 MG 24 hr tablet Take 1 tablet (30 mg total) by mouth daily. 30 tablet 2   Multiple Vitamin (MULTI-DAY VITAMINS PO) Take by mouth.     Multiple Vitamins-Minerals (PRESERVISION AREDS PO) Take by mouth. Soft gels     NIFEdipine (PROCARDIA XL/NIFEDICAL-XL) 90 MG 24 hr tablet TAKE 1 TABLET EVERY DAY 90 tablet 1   nitroGLYCERIN (NITROSTAT) 0.4 MG SL tablet Place 1 tablet (0.4 mg total) under the tongue every 5 (five) minutes as needed for chest pain. 25 tablet 3   oxyCODONE (OXY IR/ROXICODONE) 5 MG immediate release tablet Take 1 tablet (5 mg total) by mouth every 4 (four) hours as needed  for severe pain. 30 tablet 0   polyethylene glycol (MIRALAX / GLYCOLAX) 17 g packet Take 17 g by mouth daily.     potassium chloride SA (KLOR-CON M) 20 MEQ tablet TAKE 1 TABLET TWICE DAILY 180 tablet 1   rosuvastatin (CRESTOR) 5 MG tablet TAKE 1 TABLET EVERY DAY 90 tablet 1   vitamin B-12 (CYANOCOBALAMIN) 1000 MCG tablet Take 1,000 mcg by mouth daily.     No current facility-administered medications for this encounter.    REVIEW OF SYSTEMS:  On review of systems, the patient reports that he is doing well overall. He denies any chest pain, shortness of breath, cough, fevers, chills, night sweats, or unintended weight changes. He denies any bowel or bladder disturbances, and denies abdominal pain, nausea or vomiting. He remains quite active and reports his back pain is well managed with Tylenol for the most part and only occasionally uses oxycodone. He denies numbness or tingling and no radiation of pain into the buttocks or down the legs. He does occasionally have some aching pain in his thighs bilaterally in the evenings, after he has been particularly active and this does occasionally make it difficult for him to get to sleep at night. This has been ongoing for some time now and not progressively worsening. A complete review of systems is obtained and is otherwise negative.    PHYSICAL EXAM:  Wt Readings from Last 3 Encounters:  08/09/22 163 lb (73.9 kg)  06/30/22 162 lb 8 oz (73.7 kg)  06/15/22 160 lb (72.6 kg)   Temp Readings from Last 3 Encounters:  06/15/22 97.8 F (36.6 C)  05/25/22 98 F (36.7 C) (Temporal)  05/05/22 (!) 97.3 F (36.3 C) (Temporal)   BP Readings from Last 3 Encounters:  08/09/22 134/70  06/30/22 125/62  06/15/22 (!) 152/84   Pulse Readings from Last 3 Encounters:  08/09/22 92  06/30/22 85  06/15/22 84   Pain Assessment Pain Score: 4  (overall joint pains)/10  Unable to assess due to telephone follow up visit format.   KPS = 90  100 -  Normal; no  complaints; no evidence of disease. 90   - Able to carry on normal activity; minor signs or symptoms of disease. 80   - Normal activity with effort; some signs or symptoms of disease. 28   - Cares for self; unable to carry on normal activity or to do active work. 60   - Requires occasional assistance, but is able to care for most of his personal needs. 50   - Requires considerable assistance and frequent medical care. 67   - Disabled; requires special care and assistance. 58   - Severely disabled; hospital admission is indicated although death not imminent. 48   - Very sick; hospital admission necessary; active supportive treatment necessary. 10   - Moribund; fatal processes progressing rapidly. 0     - Dead  Karnofsky DA, Abelmann [place], Craver LS and Burchenal [facility] [phone]) The use of the nitrogen mustards in the palliative treatment of carcinoma: with particular reference to bronchogenic carcinoma Cancer 1 634-56  LABORATORY DATA:  Lab Results  Component Value Date   WBC 6.7 05/25/2022   HGB 9.7 (L) 05/25/2022   HCT 29.1 (L) 05/25/2022   MCV 95.7 05/25/2022   PLT 238 05/25/2022   Lab Results  Component Value Date   NA 136 05/25/2022   K 3.8 05/25/2022   CL 99 05/25/2022   CO2 28 05/25/2022   Lab Results  Component Value Date   ALT 12 05/25/2022   AST 54 (H) 05/25/2022   ALKPHOS 400 (H) 05/25/2022   BILITOT 0.5 05/25/2022     RADIOGRAPHY: NM PET (PSMA) SKULL TO MID THIGH  Result Date: 08/10/2022 CLINICAL DATA:  Castrate resistant metastatic prostate carcinoma with biochemical recurrence. EXAM: NUCLEAR MEDICINE PET SKULL BASE TO THIGH TECHNIQUE: 9.8 mCi F18 Piflufolastat (Pylarify) was injected intravenously. Full-ring PET imaging was performed from the skull base to thigh after the radiotracer. CT data was obtained and used for attenuation correction and anatomic localization. COMPARISON:  CT on 01/29/2020 FINDINGS: NECK No radiotracer activity in neck lymph nodes. Incidental CT  finding: None. CHEST Multiple sub-centimeter middle mediastinal lymph nodes are seen along the esophagus which show intense radiotracer accumulation, consistent with metastatic disease. No suspicious pulmonary nodules on the CT scan. Incidental CT finding: Aortic and coronary atherosclerotic calcification incidentally noted. ABDOMEN/PELVIS Prostate: No focal activity in the prostate. Lymph nodes: Sub-centimeter right retrocrural lymph nodes are seen which show intense radiotracer accumulation for size. Mild retroperitoneal lymphadenopathy is seen in the left para-aortic and aortocaval spaces measuring up to 12 mm short axis, which also shows intense radiotracer accumulation. Mild right common and external iliac chain lymphadenopathy is seen which shows intense radiotracer accumulation, with largest lymph node measuring 12 mm in the external iliac chain on image 189/4. Liver: No evidence of liver metastasis. Incidental CT finding: Several less than 1 cm bilateral renal calculi, without evidence of ureteral calculi or hydronephrosis. Aortic atherosclerotic calcification incidentally noted. SKELETON Diffuse sclerotic bone metastases are seen throughout the axial and visualized appendicular skeleton demonstrating intense radiotracer accumulation, consistent with diffuse bone metastases. IMPRESSION: Diffuse sclerotic bone metastases, showing intense radiotracer accumulation. Mild metastatic lymphadenopathy in the mediastinum, right retrocrural region, abdominal retroperitoneum, and right common and external iliac chains. Electronically Signed   By: Marlaine Hind M.D.   On: 08/10/2022 11:31      IMPRESSION/PLAN: 1. 81 y.o. male with castrate-resistant prostate cancer with osseous metastasis.  Today, I talked to the patient about the findings and workup thus far. We discussed the natural  history of metastatic prostate cancer with diffuse bony metastases and general treatment, highlighting the role of Xogifo infusions  in the management. We focused on the details of logistics and delivery. The recommendation is to proceed with monthly infusions of Xofigo x6. We will monitor labs prior to each infusion to ensure it is safe to proceed with each treatment. We reviewed the anticipated acute and late sequelae associated with Xofigo in this setting. The patient was encouraged to ask questions that were answered to his stated satisfaction.  At the end of our conversation, the patient elects to proceed with Xofigo infusions. We will share this information with Dr. Alen Blew and proceed with treatment planning accordingly in anticipation of beginning treatments in the near future. He knows that he is welcome to call at any time in the interim with any questions or concerns.   I personally spent 20 minutes in this encounter including chart review, reviewing radiological studies, telephone conversation with the patient, entering orders and completing documentation.    Nicholos Johns, PA-C    Tyler Pita, MD  [facility] Direct Dial: [phone]  Fax: [phone] [email or web address]  Skype  LinkedIn

## 2022-08-13 ENCOUNTER — Telehealth: Payer: Self-pay | Admitting: Urology

## 2022-08-13 NOTE — Telephone Encounter (Signed)
I have left messages on his voicemail x2 requesting he return my call to review the results of his recent PSMA PET scan and the recommendation for Xofigo infusions to help slow the progression of his diffuse bony metastases. I will await his return call.  Nicholos Johns, MMS, PA-C [place] [facility] [place]: [phone]  Fax: [phone]

## 2022-08-15 ENCOUNTER — Other Ambulatory Visit: Payer: Self-pay | Admitting: Family Medicine

## 2022-08-17 ENCOUNTER — Ambulatory Visit (HOSPITAL_BASED_OUTPATIENT_CLINIC_OR_DEPARTMENT_OTHER): Payer: Medicare HMO | Admitting: Family

## 2022-08-17 ENCOUNTER — Other Ambulatory Visit: Payer: Self-pay | Admitting: Urology

## 2022-08-17 ENCOUNTER — Encounter (HOSPITAL_BASED_OUTPATIENT_CLINIC_OR_DEPARTMENT_OTHER): Payer: Self-pay | Admitting: Family

## 2022-08-17 VITALS — BP 124/64 | HR 84 | Ht 70.5 in | Wt 161.0 lb

## 2022-08-17 DIAGNOSIS — I25118 Atherosclerotic heart disease of native coronary artery with other forms of angina pectoris: Secondary | ICD-10-CM

## 2022-08-17 DIAGNOSIS — E1121 Type 2 diabetes mellitus with diabetic nephropathy: Secondary | ICD-10-CM | POA: Diagnosis not present

## 2022-08-17 DIAGNOSIS — E785 Hyperlipidemia, unspecified: Secondary | ICD-10-CM | POA: Diagnosis not present

## 2022-08-17 DIAGNOSIS — C61 Malignant neoplasm of prostate: Secondary | ICD-10-CM

## 2022-08-17 DIAGNOSIS — Z8673 Personal history of transient ischemic attack (TIA), and cerebral infarction without residual deficits: Secondary | ICD-10-CM | POA: Diagnosis not present

## 2022-08-17 MED ORDER — ISOSORBIDE MONONITRATE ER 30 MG PO TB24: 30.0000 mg | ORAL_TABLET | Freq: Every day | ORAL | 3 refills | Status: DC

## 2022-08-17 NOTE — Progress Notes (Signed)
Office Visit    Patient Name: Wayne Perkins Date of Encounter: 08/17/2022  PCP:  [name], MD   [place]  Cardiologist:  Buford Dresser, MD  Advanced Practice Provider:  No care team member to display Electrophysiologist:  None      Chief Complaint    Wayne Perkins is a 81 y.o. adult presents today for follow up after addition of Imdur   Past Medical History    Past Medical History:  Diagnosis Date   Arthritis    Blood in stool    Childhood asthma    Diabetes mellitus without complication ([place])    type II   Heart disease    Hypertension, essential    Prostate cancer ([place])    Stroke [facility])    Past Surgical History:  Procedure Laterality Date   CORONARY ANGIOPLASTY WITH STENT PLACEMENT     GANGLION CYST EXCISION Right    PROSTATE BIOPSY      Allergies  Allergies  Allergen Reactions   Tramadol Other (See Comments)    Muscle pains and sensation of needles in muscles    Glipizide Itching   Statins Other (See Comments)    Causes narcolepsy    History of Present Illness    Wayne Perkins is a 81 y.o. adult with a hx of TIA/CVA, CAd s/p prior stents, left chest wall lipoma, HTN, DM2 with nephropathy/CKD, HLD, malignant prostate cancer last seen 08/09/22.  Prior PCI in 2004 to "the left side" per his report by Dr. Karrie Doffing at [facility]. Anginal equivalent of bilateral hand numbness, loss of consciousness. In 2010 had PCI=RCA by Dr. Valrie Hart at [facility] in [place].  Last seen 06/30/22 noting heartburn resolved with mild. Occasional episode of chest pain which he attributed to lipoma with sensation of "pulling". He also noted myalgias. BP was elevated which he attributed to pain and medication changes deferred.  Called 08/05/22 with chest pain a week prior while exerting himself. 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 While loading up the truck felt a  "heavy pressure". After loading he sat down and relaxed and it passed after 10 minutes. Since that time noted he did not have to do much exertional around the house without noting the "tinge" of pressure. It is mostly left side. It feels different than his previous "pulling" with his lipoma. In reviewing his anginal equivalent, at time of first PCI felt some heaviness and at time of second PCI felt something different and felt something wasn't right. He was started on Imdur.   He presents today for follow up with his wife. He is excited to watch his son and daughter in [place] this week. Tells me since starting the Imdur felt like he could breath better and his exercise tolerance is improved. Also feels his legs have been less sore. He has not noted headache on Imdur. He is understandably frustrated as he has not yet received results from PET scan 08/09/22 - appears oncology tried to call him but was unable to reach him. He says when he called back the recommended number he got a busy signal.  Previous antihypertensives Beta blockers - intolerance  EKGs/Labs/Other Studies Reviewed:   The following studies were reviewed today:   EKG:  EKG is not ordered today.    Recent Labs: 05/05/2022: TSH 1.60 05/25/2022: ALT 12; BUN 40; Creatinine 1.57; Hemoglobin 9.7; Platelet Count  238; Potassium 3.8; Sodium 136  Recent Lipid Panel    Component Value Date/Time   CHOL 180 05/05/2022 0855   TRIG 172.0 (H) 05/05/2022 0855   HDL 51.40 05/05/2022 0855   CHOLHDL 3 05/05/2022 0855   VLDL 34.4 05/05/2022 0855   LDLCALC 94 05/05/2022 0855   LDLCALC 159 (H) 08/15/2020 1123   LDLDIRECT 93.0 10/30/2021 1321    Home Medications   Current Meds  Medication Sig   Alcohol Swabs (B-D SINGLE USE SWABS REGULAR) PADS    Blood Glucose Calibration (ACCU-CHEK AVIVA) SOLN    blood glucose meter kit and supplies Use once a day in the AM for fasting sugars.   Calcium Carb-Cholecalciferol (OYSTER SHELL CALCIUM W/D)  500-5 MG-MCG TABS TAKE 1 TABLET TWICE DAILY   calcium-vitamin D (OSCAL WITH D) 500-200 MG-UNIT tablet Take 1 tablet by mouth 2 (two) times daily.   Cholecalciferol (VITAMIN D3) 50 MCG (2000 UT) TABS Take by mouth.   clobetasol (OLUX) 0.05 % topical foam Apply topically 2 (two) times daily. (Patient taking differently: Apply topically as needed (dry skin).)   clopidogrel (PLAVIX) 75 MG tablet TAKE 1 TABLET EVERY DAY   Dulaglutide (TRULICITY) 2.54 YH/0.6CB SOPN Inject 0.75 mg into the skin once a week.   Ez Smart Blood Glucose Lancets MISC by Does not apply route.   glucose blood test strip Use as instructed   hydrochlorothiazide (HYDRODIURIL) 25 MG tablet TAKE 1 TABLET EVERY DAY   hydrOXYzine (ATARAX) 10 MG tablet TAKE 1 TABLET BY MOUTH 3 TIMES DAILY AS NEEDED FOR ITCHING.   isosorbide mononitrate (IMDUR) 30 MG 24 hr tablet Take 1 tablet (30 mg total) by mouth daily.   Multiple Vitamin (MULTI-DAY VITAMINS PO) Take by mouth daily at 6 (six) AM.   Multiple Vitamins-Minerals (PRESERVISION AREDS PO) Take by mouth daily at 6 (six) AM. Soft gels   NIFEdipine (PROCARDIA XL/NIFEDICAL-XL) 90 MG 24 hr tablet TAKE 1 TABLET EVERY DAY   nitroGLYCERIN (NITROSTAT) 0.4 MG SL tablet Place 1 tablet (0.4 mg total) under the tongue every 5 (five) minutes as needed for chest pain.   oxyCODONE (OXY IR/ROXICODONE) 5 MG immediate release tablet Take 1 tablet (5 mg total) by mouth every 4 (four) hours as needed for severe pain.   polyethylene glycol (MIRALAX / GLYCOLAX) 17 g packet Take 17 g by mouth as needed for moderate constipation.   potassium chloride SA (KLOR-CON M) 20 MEQ tablet TAKE 1 TABLET TWICE DAILY   rosuvastatin (CRESTOR) 5 MG tablet TAKE 1 TABLET EVERY DAY   vitamin B-12 (CYANOCOBALAMIN) 1000 MCG tablet Take 1,000 mcg by mouth daily.    Review of Systems      All other systems reviewed and are otherwise negative except as noted above.  Physical Exam    VS:  BP 124/64   Pulse 84   Ht 5' 10.5"  (1.791 m)   Wt 161 lb (73 kg)   SpO2 97%   BMI 22.77 kg/m  , BMI Body mass index is 22.77 kg/m.  Wt Readings from Last 3 Encounters:  08/17/22 161 lb (73 kg)  08/09/22 163 lb (73.9 kg)  06/30/22 162 lb 8 oz (73.7 kg)    GEN: Well nourished, well developed, in no acute distress. HEENT: normal. Neck: Supple, no JVD, carotid bruits, or masses. Cardiac: RRR, no murmurs, rubs, or gallops. No clubbing, cyanosis, edema.  Radials/PT 2+ and equal bilaterally.  Respiratory:  Respirations regular and unlabored, clear to auscultation bilaterally. GI: Soft, nontender, nondistended. MS:  No deformity or atrophy. Skin: Warm and dry, no rash. Neuro:  Strength and sensation are intact. Psych: Normal affect.  Assessment & Plan    CAD - Prior PCI 2004 and 2010 in [place]. Since addition of Imdur, exertional dyspnea and chest discomfort have resolved. No indication for ischemic evaluation at this time. GDMT Imdur, Plavix, Rosuvsatatin, PRN nitroglycerin.   HLD, LDL goal <70 - 04/2022 LDL 92. Continue rosuvastatin 5 mg daily. Encouraged to continue heart healthy lifestyle with regular exercise and heart healthy diet.   Prostate cancer with metastasis to bone - Had PET 08/09/22. Understandably frustrated as he has not heart results. Had phone visit last week but appears PA was unable to reach him and left VM. He says when he called the number back it was busy signal. Will route to oncology team so they are aware and hopefully will relay results.   Hx of CVA - Continue Plavix, Rosuvastatin.  DM2 - Continue to follow with PCP.          Disposition: Follow up in 4 month(s) with Buford Dresser, MD or APP.  Signed, Loel Dubonnet, NP 08/17/2022, 11:18 AM [place]

## 2022-08-17 NOTE — Patient Instructions (Addendum)
Medication Instructions:  We sent in a 90 day refill to your mail pharmacy. *If you need a refill on your cardiac medications before your next appointment, please call your pharmacy*   Lab Work/Testing/Procedures: None ordered today.    Follow-Up: At [facility], you and your health needs are our priority.  As part of our continuing mission to provide you with exceptional heart care, we have created designated Provider Care Teams.  These Care Teams include your primary Cardiologist (physician) and Advanced Practice Providers (APPs -  Physician Assistants and Nurse Practitioners) who all work together to provide you with the care you need, when you need it.  We recommend signing up for the patient portal called "MyChart".  Sign up information is provided on this After Visit Summary.  MyChart is used to connect with patients for Virtual Visits (Telemedicine).  Patients are able to view lab/test results, encounter notes, upcoming appointments, etc.  Non-urgent messages can be sent to your provider as well.   To learn more about what you can do with MyChart, go to [email or web address].    Your next appointment:   As scheduled with Dr. Harrell Gave in April  Other Instructions  Heart Healthy Diet Recommendations: A low-salt diet is recommended. Meats should be grilled, baked, or boiled. Avoid fried foods. Focus on lean protein sources like fish or chicken with vegetables and fruits. The American Heart Association is a Microbiologist!  American Heart Association Diet and Lifeystyle Recommendations   Exercise recommendations: The American Heart Association recommends 150 minutes of moderate intensity exercise weekly. Try 30 minutes of moderate intensity exercise 4-5 times per week. This could include walking, jogging, or swimming.

## 2022-08-24 ENCOUNTER — Encounter: Payer: Self-pay | Admitting: Radiation Oncology

## 2022-08-24 DIAGNOSIS — Z191 Hormone sensitive malignancy status: Secondary | ICD-10-CM | POA: Diagnosis not present

## 2022-08-24 DIAGNOSIS — C7951 Secondary malignant neoplasm of bone: Secondary | ICD-10-CM | POA: Diagnosis not present

## 2022-08-24 DIAGNOSIS — C61 Malignant neoplasm of prostate: Secondary | ICD-10-CM | POA: Diagnosis not present

## 2022-08-24 NOTE — Progress Notes (Signed)
  Radiation Oncology         [phone] ________________________________  Name: [name] MRN: [id number]  Date: 08/24/2022  DOB: July 13, 1941  Xofigo Treatment Planning Note:  Diagnosis:  Castration resistant prostate cancer with painful bone involvement  Narrative: [name] is a patient who has been diagnosed with castration resistant prostate cancer with painful bone involvement.  His most recent blood counts show that he remains a good candidate to proceed with Ra-223.  The patient called the clinic last week and has decided to receive [facility] for his treatment.   Radiation Treatment Planning:  The prescribed radiation activity will be 50 kBq per kg per infusions. The plan is to offer a total of 6 IV administrations of this agent, assuming the blood counts are adequate prior to each administration, with each infusion done at 4 week intervals.  This will be done as an IV administration in the nuclear medicine department, with care to undertake all radiation protection precautions as recommended.   ________________________________  Sheral Apley. Tammi Klippel, M.D.

## 2022-08-25 ENCOUNTER — Inpatient Hospital Stay: Payer: Medicare HMO | Admitting: Oncology

## 2022-08-25 ENCOUNTER — Inpatient Hospital Stay: Payer: Medicare HMO | Attending: Oncology

## 2022-08-25 ENCOUNTER — Other Ambulatory Visit: Payer: Self-pay

## 2022-08-25 ENCOUNTER — Inpatient Hospital Stay: Payer: Medicare HMO

## 2022-08-25 VITALS — BP 138/75 | HR 86 | Temp 97.8°F | Resp 17 | Ht 70.5 in | Wt 162.4 lb

## 2022-08-25 DIAGNOSIS — C7951 Secondary malignant neoplasm of bone: Secondary | ICD-10-CM | POA: Diagnosis not present

## 2022-08-25 DIAGNOSIS — Z888 Allergy status to other drugs, medicaments and biological substances status: Secondary | ICD-10-CM | POA: Diagnosis not present

## 2022-08-25 DIAGNOSIS — C61 Malignant neoplasm of prostate: Secondary | ICD-10-CM

## 2022-08-25 DIAGNOSIS — Z79899 Other long term (current) drug therapy: Secondary | ICD-10-CM | POA: Insufficient documentation

## 2022-08-25 LAB — CBC WITH DIFFERENTIAL (CANCER CENTER ONLY)
Abs Immature Granulocytes: 0.19 10*3/uL — ABNORMAL HIGH (ref 0.00–0.07)
Basophils Absolute: 0 10*3/uL (ref 0.0–0.1)
Basophils Relative: 1 %
Eosinophils Absolute: 0.3 10*3/uL (ref 0.0–0.5)
Eosinophils Relative: 5 %
HCT: 23.6 % — ABNORMAL LOW (ref 39.0–52.0)
Hemoglobin: 7.5 g/dL — ABNORMAL LOW (ref 13.0–17.0)
Immature Granulocytes: 4 %
Lymphocytes Relative: 18 %
Lymphs Abs: 1 10*3/uL (ref 0.7–4.0)
MCH: 30.6 pg (ref 26.0–34.0)
MCHC: 31.8 g/dL (ref 30.0–36.0)
MCV: 96.3 fL (ref 80.0–100.0)
Monocytes Absolute: 0.5 10*3/uL (ref 0.1–1.0)
Monocytes Relative: 9 %
Neutro Abs: 3.3 10*3/uL (ref 1.7–7.7)
Neutrophils Relative %: 63 %
Platelet Count: 348 10*3/uL (ref 150–400)
RBC: 2.45 MIL/uL — ABNORMAL LOW (ref 4.22–5.81)
RDW: 17.7 % — ABNORMAL HIGH (ref 11.5–15.5)
WBC Count: 5.2 10*3/uL (ref 4.0–10.5)
nRBC: 0.4 % — ABNORMAL HIGH (ref 0.0–0.2)

## 2022-08-25 LAB — CMP (CANCER CENTER ONLY)
ALT: 10 U/L (ref 0–44)
AST: 20 U/L (ref 15–41)
Albumin: 3.9 g/dL (ref 3.5–5.0)
Alkaline Phosphatase: 258 U/L — ABNORMAL HIGH (ref 38–126)
Anion gap: 9 (ref 5–15)
BUN: 30 mg/dL — ABNORMAL HIGH (ref 8–23)
CO2: 26 mmol/L (ref 22–32)
Calcium: 9.2 mg/dL (ref 8.9–10.3)
Chloride: 104 mmol/L (ref 98–111)
Creatinine: 1.38 mg/dL — ABNORMAL HIGH (ref 0.61–1.24)
GFR, Estimated: 51 mL/min — ABNORMAL LOW (ref >60)
Glucose, Bld: 231 mg/dL — ABNORMAL HIGH (ref 70–99)
Potassium: 4 mmol/L (ref 3.5–5.1)
Sodium: 139 mmol/L (ref 135–145)
Total Bilirubin: 0.3 mg/dL (ref 0.3–1.2)
Total Protein: 6.9 g/dL (ref 6.5–8.1)

## 2022-08-25 MED ORDER — LEUPROLIDE ACETATE (3 MONTH) 22.5 MG ~~LOC~~ KIT
22.5000 mg | PACK | Freq: Once | SUBCUTANEOUS | Status: AC
  Administered 2022-08-25: 22.5 mg via SUBCUTANEOUS
  Filled 2022-08-25: qty 22.5

## 2022-08-25 MED ORDER — OXYCODONE HCL 5 MG PO TABS: 5.0000 mg | ORAL_TABLET | ORAL | 0 refills | Status: DC | PRN

## 2022-08-25 NOTE — Addendum Note (Signed)
Addended by: Wyatt Portela on: 08/25/2022 08:58 AM   Modules accepted: Orders

## 2022-08-25 NOTE — Progress Notes (Signed)
Hematology and Oncology Follow Up Visit  Wayne Perkins [id number] [birth year] 81 y.o. 08/25/2022 8:27 AM Wayne Perkins, MDTabori, Wayne Millet, MD   Principle Diagnosis: 81 year old man with advanced prostate cancer with disease to the bone and lymphadenopathy documented in 2021.  He has castration-resistant after initially presented in 2015 with localized disease and Gleason score 6.   Prior Therapy:  He was on active surveillance for a period of time and subsequently had a rise in his PSA that necessitated definitive treatment and received external beam radiation as a definitive treatment in 2015.   His PSA continues to rise subsequently and was started on androgen deprivation therapy for biochemically recurrent disease.  His PSA decreased down to 0.4 in November 2018.  Due to hot flashes and increased toxicities he was receiving it intermittently and was off androgen deprivation therapy in 2019 with a PSA rise up to 10.9.      He did receive another 6 months of androgen deprivation injection under the care of Dr. Alinda Perkins and received ADT at that time after his PSA was up to 24.7.  January 2021 is PSA was 4.27 and stopped ADT.    He started developing hematuria and subsequently had a CT scan hematuria protocol in June 2021.  Small sclerotic lesions noted throughout the visualized widespread metastatic disease.  His PSA at that time was 5.68 in April 2021.  Bone scan at that time did not show any evidence of metastatic disease except for a new midthoracic focal area of increased activity.   Xtandi 160 mg daily started in June 2021.  Therapy discontinued because of poor tolerance after 3 weeks.  Zytiga 500 mg daily with prednisone 5 mg daily started in May 2022.  Therapy discontinued in June 2022 due to poor tolerance and he is unwilling to try a different medication or alternative treatment.  Current therapy:  Eligard 22.5 mg every 3 months restarted on February 29, 2020.  He will receive his  next injection today and repeated in 3 months.     Interim History: [name] returns today for a follow-up.  Since the last visit, he reports feeling well without any major complaints.  He reports oxycodone is helping his bone pain which he takes only 1 at nighttime.  He denies any worsening bone pain or pathological fractures.  He denies excessive fatigue, tiredness or weakness.  He denies any shortness of breath or difficulty breathing.  He does report mild fatigue overall.    Medications: Reviewed without changes. Current Outpatient Medications  Medication Sig Dispense Refill   Alcohol Swabs (B-D SINGLE USE SWABS REGULAR) PADS      Blood Glucose Calibration (ACCU-CHEK AVIVA) SOLN      blood glucose meter kit and supplies Use once a day in the AM for fasting sugars. 1 each 0   Calcium Carb-Cholecalciferol (OYSTER SHELL CALCIUM W/D) 500-5 MG-MCG TABS TAKE 1 TABLET TWICE DAILY 180 tablet 10   calcium-vitamin D (OSCAL WITH D) 500-200 MG-UNIT tablet Take 1 tablet by mouth 2 (two) times daily. 60 tablet 3   Cholecalciferol (VITAMIN D3) 50 MCG (2000 UT) TABS Take by mouth.     clobetasol (OLUX) 0.05 % topical foam Apply topically 2 (two) times daily. (Patient taking differently: Apply topically as needed (dry skin).) 100 g 2   clopidogrel (PLAVIX) 75 MG tablet TAKE 1 TABLET EVERY DAY 90 tablet 3   Dulaglutide (TRULICITY) 5.36 UY/4.0HK SOPN Inject 0.75 mg into the skin once a week. 4 pen 0  Ez Smart Blood Glucose Lancets MISC by Does not apply route. 100 each 3   glucose blood test strip Use as instructed 100 each 3   hydrochlorothiazide (HYDRODIURIL) 25 MG tablet TAKE 1 TABLET EVERY DAY 90 tablet 0   hydrOXYzine (ATARAX) 10 MG tablet TAKE 1 TABLET BY MOUTH 3 TIMES DAILY AS NEEDED FOR ITCHING. 270 tablet 1   isosorbide mononitrate (IMDUR) 30 MG 24 hr tablet Take 1 tablet (30 mg total) by mouth daily. 90 tablet 3   Multiple Vitamin (MULTI-DAY VITAMINS PO) Take by mouth daily at 6 (six) AM.      Multiple Vitamins-Minerals (PRESERVISION AREDS PO) Take by mouth daily at 6 (six) AM. Soft gels     NIFEdipine (PROCARDIA XL/NIFEDICAL-XL) 90 MG 24 hr tablet TAKE 1 TABLET EVERY DAY 90 tablet 1   nitroGLYCERIN (NITROSTAT) 0.4 MG SL tablet Place 1 tablet (0.4 mg total) under the tongue every 5 (five) minutes as needed for chest pain. 25 tablet 3   oxyCODONE (OXY IR/ROXICODONE) 5 MG immediate release tablet Take 1 tablet (5 mg total) by mouth every 4 (four) hours as needed for severe pain. 30 tablet 0   polyethylene glycol (MIRALAX / GLYCOLAX) 17 g packet Take 17 g by mouth as needed for moderate constipation.     potassium chloride SA (KLOR-CON M) 20 MEQ tablet TAKE 1 TABLET TWICE DAILY 180 tablet 1   rosuvastatin (CRESTOR) 5 MG tablet TAKE 1 TABLET EVERY DAY 90 tablet 1   vitamin B-12 (CYANOCOBALAMIN) 1000 MCG tablet Take 1,000 mcg by mouth daily.     No current facility-administered medications for this visit.     Allergies:  Allergies  Allergen Reactions   Tramadol Other (See Comments)    Muscle pains and sensation of needles in muscles    Glipizide Itching   Statins Other (See Comments)    Causes narcolepsy      Physical Exam:     Blood pressure 138/75, pulse 86, temperature 97.8 F (36.6 C), temperature source Temporal, resp. rate 17, height 5' 10.5" (1.791 m), weight 162 lb 6.4 oz (73.7 kg), SpO2 97 %.     ECOG: 1    General appearance: Alert, awake without any distress. Head: Atraumatic without abnormalities Oropharynx: Without any thrush or ulcers. Eyes: No scleral icterus. Lymph nodes: No lymphadenopathy noted in the cervical, supraclavicular, or axillary nodes Heart:regular rate and rhythm, without any murmurs or gallops.   Lung: Clear to auscultation without any rhonchi, wheezes or dullness to percussion. Abdomin: Soft, nontender without any shifting dullness or ascites. Musculoskeletal: No clubbing or cyanosis. Neurological: No motor or sensory  deficits. Skin: No rashes or lesions.           Lab Results: Lab Results  Component Value Date   WBC 5.2 08/25/2022   HGB 7.5 (L) 08/25/2022   HCT 23.6 (L) 08/25/2022   MCV 96.3 08/25/2022   PLT 348 08/25/2022   PSA 6.98 (H) 10/27/2020     Chemistry      Component Value Date/Time   NA 136 05/25/2022 0904   K 3.8 05/25/2022 0904   CL 99 05/25/2022 0904   CO2 28 05/25/2022 0904   BUN 40 (H) 05/25/2022 0904   CREATININE 1.57 (H) 05/25/2022 0904   CREATININE 1.66 (H) 08/15/2020 1123      Component Value Date/Time   CALCIUM 9.2 05/25/2022 0904   ALKPHOS 400 (H) 05/25/2022 0904   AST 54 (H) 05/25/2022 0904   ALT 12 05/25/2022 0904  BILITOT 0.5 05/25/2022 0904       Latest Reference Range & Units 11/19/21 08:32 05/25/22 09:04  Prostate Specific Ag, Serum 0.0 - 4.0 ng/mL 146.0 (H) 303.0 (H)  (H): Data is abnormally high   Impression and Plan:   81 year old man with:   1.  Advanced prostate cancer with disease to the bone and lymphadenopathy diagnosed in 2021.  He has castration-resistant disease disease at this time.  His disease status was updated at this time and treatment choices were reviewed.  His PSA continues to rise and PSMA PET scan obtained on August 09, 2022 showed widespread disease predominantly bone and lymphadenopathy.  Treatment options were reiterated including oral targeted therapy, PARP inhibitor, Taxotere chemotherapy as well as Trudi Ida which is currently under consideration under care of Dr. Tammi Klippel.  He is pretty adamant that he does not want to try any oral medication or systemic chemotherapy.  He is open to the idea of Xofigo although his hemoglobin could be prohibitive at this time.  He understands his disease will continue to progress likely require hospice in the future.  He understands this and accepts its fully.  The plan is to this plan to continue with androgen deprivation therapy alone and he will consider Xofigo under the care of  Dr. Tammi Klippel.    2.   Androgen deprivation therapy: He will receive that today and repeated in 3 months.  Complication clinic hot flashes and weight gain were discussed..   3.  Bone directed therapy: He declined Xgeva in the past.  I recommended calcium and vitamin D supplements.  4.  Prognosis and goals of care: His disease is incurable and progressing.  His performance status remains excellent however.  He understands that eventually he will start to decline transitioning to hospice may be required.  5.  Bone pain: Manageable with very low-dose oxycodone once a day this will be refilled for him.  6.  Anemia: Multifactorial and predominantly related to his malignancy.  He has likely infiltrative bone marrow process with prostate cancer.  I offered him a transfusion and he declined for the time being.  He will let me know in the future whether he is agreeable.  I urged him to do that in preparation for Trudi Ida if he is interested.     7.  Follow-up: He will return in 3 months for a follow-up.     40  minutes were dedicated to this encounter.  Time was spent on reviewing laboratory data, imaging studies, treatment choices and addressing complications related to his cancer and cancer therapy and discussing overall prognosis.    Zola Button, MD 12/27/20238:27 AM.

## 2022-08-26 LAB — PROSTATE-SPECIFIC AG, SERUM (LABCORP): Prostate Specific Ag, Serum: 369 ng/mL — ABNORMAL HIGH (ref 0.0–4.0)

## 2022-08-27 ENCOUNTER — Other Ambulatory Visit: Payer: Self-pay | Admitting: *Deleted

## 2022-08-27 ENCOUNTER — Telehealth: Payer: Self-pay | Admitting: *Deleted

## 2022-08-27 DIAGNOSIS — C61 Malignant neoplasm of prostate: Secondary | ICD-10-CM

## 2022-08-27 NOTE — Telephone Encounter (Signed)
Appt scheduled.Orders placed

## 2022-08-27 NOTE — Telephone Encounter (Signed)
Mr Wayne Perkins says he spoke with his PCP. They told him that if you recommend a transfusion, that he should do it. Is OK for transfusion next week.

## 2022-09-01 ENCOUNTER — Inpatient Hospital Stay: Payer: Medicare HMO

## 2022-09-01 ENCOUNTER — Inpatient Hospital Stay: Payer: Medicare HMO | Attending: Oncology

## 2022-09-01 ENCOUNTER — Telehealth: Payer: Self-pay | Admitting: *Deleted

## 2022-09-01 ENCOUNTER — Other Ambulatory Visit: Payer: Self-pay

## 2022-09-01 ENCOUNTER — Other Ambulatory Visit: Payer: Self-pay | Admitting: *Deleted

## 2022-09-01 VITALS — BP 148/85 | HR 79 | Temp 97.9°F | Resp 16

## 2022-09-01 DIAGNOSIS — Z888 Allergy status to other drugs, medicaments and biological substances status: Secondary | ICD-10-CM | POA: Diagnosis not present

## 2022-09-01 DIAGNOSIS — Z79899 Other long term (current) drug therapy: Secondary | ICD-10-CM | POA: Insufficient documentation

## 2022-09-01 DIAGNOSIS — C61 Malignant neoplasm of prostate: Secondary | ICD-10-CM

## 2022-09-01 DIAGNOSIS — D630 Anemia in neoplastic disease: Secondary | ICD-10-CM | POA: Insufficient documentation

## 2022-09-01 DIAGNOSIS — C7951 Secondary malignant neoplasm of bone: Secondary | ICD-10-CM | POA: Insufficient documentation

## 2022-09-01 LAB — CMP (CANCER CENTER ONLY)
ALT: 11 U/L (ref 0–44)
AST: 32 U/L (ref 15–41)
Albumin: 3.9 g/dL (ref 3.5–5.0)
Alkaline Phosphatase: 308 U/L — ABNORMAL HIGH (ref 38–126)
Anion gap: 10 (ref 5–15)
BUN: 27 mg/dL — ABNORMAL HIGH (ref 8–23)
CO2: 27 mmol/L (ref 22–32)
Calcium: 9.5 mg/dL (ref 8.9–10.3)
Chloride: 104 mmol/L (ref 98–111)
Creatinine: 1.32 mg/dL — ABNORMAL HIGH (ref 0.61–1.24)
GFR, Estimated: 54 mL/min — ABNORMAL LOW (ref >60)
Glucose, Bld: 195 mg/dL — ABNORMAL HIGH (ref 70–99)
Potassium: 3.6 mmol/L (ref 3.5–5.1)
Sodium: 141 mmol/L (ref 135–145)
Total Bilirubin: 0.3 mg/dL (ref 0.3–1.2)
Total Protein: 6.2 g/dL — ABNORMAL LOW (ref 6.5–8.1)

## 2022-09-01 LAB — CBC WITH DIFFERENTIAL (CANCER CENTER ONLY)
Abs Immature Granulocytes: 0.1 10*3/uL — ABNORMAL HIGH (ref 0.00–0.07)
Basophils Absolute: 0 10*3/uL (ref 0.0–0.1)
Basophils Relative: 1 %
Eosinophils Absolute: 0.3 10*3/uL (ref 0.0–0.5)
Eosinophils Relative: 7 %
HCT: 25.1 % — ABNORMAL LOW (ref 39.0–52.0)
Hemoglobin: 7.9 g/dL — ABNORMAL LOW (ref 13.0–17.0)
Immature Granulocytes: 2 %
Lymphocytes Relative: 18 %
Lymphs Abs: 0.9 10*3/uL (ref 0.7–4.0)
MCH: 30.3 pg (ref 26.0–34.0)
MCHC: 31.5 g/dL (ref 30.0–36.0)
MCV: 96.2 fL (ref 80.0–100.0)
Monocytes Absolute: 0.5 10*3/uL (ref 0.1–1.0)
Monocytes Relative: 10 %
Neutro Abs: 3 10*3/uL (ref 1.7–7.7)
Neutrophils Relative %: 62 %
Platelet Count: 274 10*3/uL (ref 150–400)
RBC: 2.61 MIL/uL — ABNORMAL LOW (ref 4.22–5.81)
RDW: 18 % — ABNORMAL HIGH (ref 11.5–15.5)
WBC Count: 4.9 10*3/uL (ref 4.0–10.5)
nRBC: 0.4 % — ABNORMAL HIGH (ref 0.0–0.2)

## 2022-09-01 LAB — PREPARE RBC (CROSSMATCH)

## 2022-09-01 LAB — SAMPLE TO BLOOD BANK

## 2022-09-01 LAB — ABO/RH: ABO/RH(D): A NEG

## 2022-09-01 MED ORDER — DIPHENHYDRAMINE HCL 25 MG PO CAPS
25.0000 mg | ORAL_CAPSULE | Freq: Once | ORAL | Status: AC
  Administered 2022-09-01: 25 mg via ORAL
  Filled 2022-09-01: qty 1

## 2022-09-01 MED ORDER — ACETAMINOPHEN 325 MG PO TABS: 650.0000 mg | ORAL_TABLET | Freq: Once | ORAL | Status: DC

## 2022-09-01 MED ORDER — SODIUM CHLORIDE 0.9% IV SOLUTION
250.0000 mL | Freq: Once | INTRAVENOUS | Status: AC
  Administered 2022-09-01: 250 mL via INTRAVENOUS

## 2022-09-01 NOTE — Patient Instructions (Signed)

## 2022-09-01 NOTE — Telephone Encounter (Signed)
CALLED PATIENT TO INFORM OF LAB AND WEIGHT ON 09-21-22 @ 12 PM @ [place] ON 09-28-22 - ARRIVAL TIME- 11:45 AM @ [facility], SPOKE WITH PATIENT AND HE IS AWARE OF THESE APPTS. AND THE INSTRUCTIONS

## 2022-09-02 LAB — TYPE AND SCREEN
ABO/RH(D): A NEG
Antibody Screen: NEGATIVE
Unit division: 0
Unit division: 0

## 2022-09-02 LAB — BPAM RBC
Blood Product Expiration Date: 202401312359
Blood Product Expiration Date: 202401312359
ISSUE DATE / TIME: 202401031024
ISSUE DATE / TIME: 202401031024
Unit Type and Rh: 600
Unit Type and Rh: 600

## 2022-09-02 LAB — PROSTATE-SPECIFIC AG, SERUM (LABCORP): Prostate Specific Ag, Serum: 330 ng/mL — ABNORMAL HIGH (ref 0.0–4.0)

## 2022-09-10 ENCOUNTER — Ambulatory Visit (INDEPENDENT_AMBULATORY_CARE_PROVIDER_SITE_OTHER): Payer: Medicare HMO | Admitting: Family Medicine

## 2022-09-10 ENCOUNTER — Encounter: Payer: Self-pay | Admitting: Family Medicine

## 2022-09-10 VITALS — BP 116/70 | HR 81 | Temp 98.2°F | Resp 18 | Ht 70.5 in | Wt 159.4 lb

## 2022-09-10 DIAGNOSIS — E1121 Type 2 diabetes mellitus with diabetic nephropathy: Secondary | ICD-10-CM | POA: Diagnosis not present

## 2022-09-10 LAB — BASIC METABOLIC PANEL
BUN: 24 mg/dL — ABNORMAL HIGH (ref 6–23)
CO2: 28 mEq/L (ref 19–32)
Calcium: 10.1 mg/dL (ref 8.4–10.5)
Chloride: 100 mEq/L (ref 96–112)
Creatinine, Ser: 1.23 mg/dL (ref 0.40–1.50)
GFR: 54.83 mL/min — ABNORMAL LOW (ref >60.00)
Glucose, Bld: 133 mg/dL — ABNORMAL HIGH (ref 70–99)
Potassium: 4 mEq/L (ref 3.5–5.1)
Sodium: 139 mEq/L (ref 135–145)

## 2022-09-10 LAB — HEMOGLOBIN A1C: Hgb A1c MFr Bld: 7.2 % — ABNORMAL HIGH (ref 4.6–6.5)

## 2022-09-10 NOTE — Progress Notes (Signed)
   Subjective:    Patient ID: Wayne Perkins, adult    DOB: [birth year], 82 y.o.   MRN: [id number]  HPI DM- chronic problem, on Trulicity 0.'75mg'$  weekly.  Last A1C 7.9%.  UTD on foot exam, microalbumin.  Had eye exam w/ Dr Katy Fitch  Not checking sugars at home.  Denies symptomatic lows.  No CP, SOB above baseline, HA's, visual changes, abd pain, N/V.   Review of Systems For ROS see HPI     Objective:   Physical Exam Vitals reviewed.  Constitutional:      General: He is not in acute distress.    Appearance: Normal appearance. He is well-developed. He is not ill-appearing.  HENT:     Head: Normocephalic and atraumatic.  Eyes:     Extraocular Movements: Extraocular movements intact.     Conjunctiva/sclera: Conjunctivae normal.     Pupils: Pupils are equal, round, and reactive to light.  Neck:     Thyroid: No thyromegaly.  Cardiovascular:     Rate and Rhythm: Normal rate and regular rhythm.     Pulses: Normal pulses.     Heart sounds: Normal heart sounds. No murmur heard. Pulmonary:     Effort: Pulmonary effort is normal. No respiratory distress.     Breath sounds: Normal breath sounds.  Abdominal:     General: Bowel sounds are normal. There is no distension.     Palpations: Abdomen is soft.  Musculoskeletal:     Cervical back: Normal range of motion and neck supple.     Right lower leg: No edema.     Left lower leg: No edema.  Lymphadenopathy:     Cervical: No cervical adenopathy.  Skin:    General: Skin is warm and dry.  Neurological:     General: No focal deficit present.     Mental Status: He is alert and oriented to person, place, and time.     Cranial Nerves: No cranial nerve deficit.  Psychiatric:        Mood and Affect: Mood normal.        Behavior: Behavior normal.           Assessment & Plan:

## 2022-09-10 NOTE — Patient Instructions (Signed)
Schedule your complete physical in 3-4 months We'll notify you of your lab results and make any changes if needed Keep up the good work on low carb diet and regular physical activity Call with any questions or concerns Stay Safe!  Stay Healthy! Happy New Year!!!

## 2022-09-10 NOTE — Assessment & Plan Note (Signed)
Chronic problem.  Currently on Trulicity w/o difficulty.  UTD on eye exam (attempting to get records), foot exam, microalbumin.  Check labs.  Adjust meds prn

## 2022-09-15 ENCOUNTER — Other Ambulatory Visit: Payer: Self-pay | Admitting: *Deleted

## 2022-09-15 DIAGNOSIS — C61 Malignant neoplasm of prostate: Secondary | ICD-10-CM

## 2022-09-15 MED ORDER — OXYCODONE HCL 5 MG PO TABS: 5.0000 mg | ORAL_TABLET | ORAL | 0 refills | Status: DC | PRN

## 2022-09-17 ENCOUNTER — Encounter: Payer: Self-pay | Admitting: Family Medicine

## 2022-09-20 ENCOUNTER — Other Ambulatory Visit: Payer: Self-pay

## 2022-09-20 ENCOUNTER — Telehealth: Payer: Self-pay | Admitting: *Deleted

## 2022-09-20 NOTE — Telephone Encounter (Signed)
CALLED PATIENT TO REMIND OF LAB AND WEIGHT APPT. FOR 09-21-22- ARRIVAL TIME- 11:45 AM @ [place], SPOKE WITH PATIENT AND HE IS AWARE OF THIS APPT.

## 2022-09-21 ENCOUNTER — Other Ambulatory Visit: Payer: Self-pay

## 2022-09-21 ENCOUNTER — Ambulatory Visit
Admission: RE | Admit: 2022-09-21 | Discharge: 2022-09-21 | Disposition: A | Discharge status: Home / Self Care | Payer: Medicare HMO | Source: Ambulatory Visit | Attending: Hematology | Admitting: Hematology

## 2022-09-21 DIAGNOSIS — C7951 Secondary malignant neoplasm of bone: Secondary | ICD-10-CM | POA: Diagnosis not present

## 2022-09-21 DIAGNOSIS — C61 Malignant neoplasm of prostate: Secondary | ICD-10-CM

## 2022-09-21 LAB — CBC WITH DIFFERENTIAL (CANCER CENTER ONLY)
Abs Immature Granulocytes: 0.03 10*3/uL (ref 0.00–0.07)
Basophils Absolute: 0 10*3/uL (ref 0.0–0.1)
Basophils Relative: 1 %
Eosinophils Absolute: 0.3 10*3/uL (ref 0.0–0.5)
Eosinophils Relative: 6 %
HCT: 31 % — ABNORMAL LOW (ref 39.0–52.0)
Hemoglobin: 10.1 g/dL — ABNORMAL LOW (ref 13.0–17.0)
Immature Granulocytes: 1 %
Lymphocytes Relative: 28 %
Lymphs Abs: 1.5 10*3/uL (ref 0.7–4.0)
MCH: 30.1 pg (ref 26.0–34.0)
MCHC: 32.6 g/dL (ref 30.0–36.0)
MCV: 92.5 fL (ref 80.0–100.0)
Monocytes Absolute: 0.7 10*3/uL (ref 0.1–1.0)
Monocytes Relative: 12 %
Neutro Abs: 3 10*3/uL (ref 1.7–7.7)
Neutrophils Relative %: 52 %
Platelet Count: 301 10*3/uL (ref 150–400)
RBC: 3.35 MIL/uL — ABNORMAL LOW (ref 4.22–5.81)
RDW: 16 % — ABNORMAL HIGH (ref 11.5–15.5)
WBC Count: 5.6 10*3/uL (ref 4.0–10.5)
nRBC: 0 % (ref 0.0–0.2)

## 2022-09-24 ENCOUNTER — Telehealth: Payer: Self-pay | Admitting: *Deleted

## 2022-09-24 NOTE — Telephone Encounter (Signed)
CALLED PATIENT TO INFORM THAT XOFIGO INJ. ON 09-29-22- ARRIVAL TIME- 1:45 PM @ [facility], LVM FOR A RETURN CALL

## 2022-09-27 ENCOUNTER — Ambulatory Visit: Payer: Medicare HMO | Admitting: Dermatology

## 2022-09-28 ENCOUNTER — Ambulatory Visit (HOSPITAL_COMMUNITY): Payer: Medicare HMO

## 2022-09-28 ENCOUNTER — Telehealth: Payer: Self-pay | Admitting: *Deleted

## 2022-09-28 NOTE — Telephone Encounter (Signed)
CALLED PATIENT TO REMIND OF XOFIGO INJ. FOR 09-29-22- ARRIVAL TIME- 1:45 PM @ [facility], SPOKE WITH PATIENT AND HE IS AWARE OF THIS INJ.

## 2022-09-28 NOTE — Telephone Encounter (Signed)
CALLED PATIENT TO REMIND OF XOFIGO INJ. FOR 09-29-22- ARRIVAL TIME- 1:45 PM @ [facility], SPOKE WITH PATIENT AND HE IS AWARE OF THIS APPT.

## 2022-09-29 ENCOUNTER — Ambulatory Visit (HOSPITAL_COMMUNITY)
Admission: RE | Admit: 2022-09-29 | Discharge: 2022-09-29 | Disposition: A | Discharge status: Home / Self Care | Payer: Medicare HMO | Source: Ambulatory Visit | Attending: Urology | Admitting: Urology

## 2022-09-29 DIAGNOSIS — C7951 Secondary malignant neoplasm of bone: Secondary | ICD-10-CM | POA: Insufficient documentation

## 2022-09-29 DIAGNOSIS — C61 Malignant neoplasm of prostate: Secondary | ICD-10-CM | POA: Insufficient documentation

## 2022-09-29 DIAGNOSIS — Z191 Hormone sensitive malignancy status: Secondary | ICD-10-CM | POA: Diagnosis not present

## 2022-09-29 MED ORDER — RADIUM RA 223 DICHLORIDE 30 MCCI/ML IV SOLN
107.4000 | Freq: Once | INTRAVENOUS | Status: AC
  Administered 2022-09-29: 107.4 via INTRAVENOUS

## 2022-09-29 NOTE — Progress Notes (Signed)
  Radiation Oncology         ([phone] ________________________________  Name: Wayne Perkins MRN: [id number]  Date: 09/29/2022  DOB: 11/19/1940  Radium-223 Infusion Note  Diagnosis:  Castration resistant prostate cancer with painful bone involvement  Current Infusion:    1  Planned Infusions:  6  Narrative: [name] presented to nuclear medicine for treatment. His most recent blood counts were reviewed.  He remains a good candidate to proceed with Ra-223.  The patient was situated in an infusion suite with a contact barrier placed under his arm. Intravenous access was established, using sterile technique, and a normal saline infusion from a syringe was started.  Micro-dosimetry:  The prescribed radiation activity was assayed and confirmed to be within specified tolerance.  Special Treatment Procedure - Infusion:  The nuclear medicine technologist and I personally verified the dose activity to be delivered as specified in the written directive, and verified the patient identification via 2 separate methods.  The syringe containing the dose was attached to an intravenous access and the dose delivered over a minute. No complications were noted.  The total administered dose was 110.7 microcuries.   A saline flush of the line and the syringe that contained the isotope was then performed.  The residual radioactivity in the syringe was 3.3 microcuries, so the actual infused isotope activity was 107.4 microcuries.   Pressure was applied to the venipuncture site, and a compression bandage placed.   Radiation Safety personnel were present to perform the discharge survey, as detailed on their documentation.   After a short period of observation, the patient had his IV removed.  Impression:  The patient tolerated his infusion relatively well.  Plan:  The patient will return in one month for ongoing care.    ________________________________  Sheral Apley. Tammi Klippel, M.D.

## 2022-10-04 ENCOUNTER — Telehealth: Payer: Self-pay | Admitting: *Deleted

## 2022-10-04 ENCOUNTER — Telehealth: Payer: Self-pay

## 2022-10-04 NOTE — Telephone Encounter (Signed)
RN returned call to Wayne Perkins after identity verified we talked about his concerns of possible side effects from recent Xofigo injection on 09/29/2022.  Wayne Perkins reports decrease energy, weakness in lower extremities, mild ache/pain bone 2/10, is taking tylenol as needed and takes Oxyir 5 mg once a day.  Reports staying hydrated and eating less but still has an appetite.  Denies fever, nausea/vomiting, diarrhea, urinary retention, and swelling to arms/legs.  He reports having twisted ankle on yesterday 10/03/2022 after standing up from chair.  No swelling or bruising observed at this time.  Reports urinary stream weak/slow, frequency, and issue with start and stopping urine.  Reports loose stool x 1 but not diarrhea. Advised Wayne Perkins to continue to stay hydrated and eat 5-6 small meals.  To call back if any symptoms should worsen if have fever, SOB, diarrhea, nausea vomiting, and increase weakness.  Dr. Tammi Klippel made aware.

## 2022-10-04 NOTE — Telephone Encounter (Signed)
Returned patient's phone call, lvm for a return call 

## 2022-10-08 ENCOUNTER — Telehealth: Payer: Self-pay

## 2022-10-08 ENCOUNTER — Other Ambulatory Visit: Payer: Self-pay

## 2022-10-08 DIAGNOSIS — C7951 Secondary malignant neoplasm of bone: Secondary | ICD-10-CM

## 2022-10-08 NOTE — Telephone Encounter (Signed)
Patient called in needing a refill on his oxycodone 47m called into the pharmacy [facility] [place] He is a Dr. SAlen Blew

## 2022-10-10 ENCOUNTER — Other Ambulatory Visit: Payer: Self-pay | Admitting: Family Medicine

## 2022-10-11 ENCOUNTER — Other Ambulatory Visit: Payer: Self-pay | Admitting: Hematology

## 2022-10-11 DIAGNOSIS — C61 Malignant neoplasm of prostate: Secondary | ICD-10-CM

## 2022-10-11 MED ORDER — OXYCODONE HCL 5 MG PO TABS: 5.0000 mg | ORAL_TABLET | ORAL | 0 refills | Status: DC | PRN

## 2022-10-12 ENCOUNTER — Telehealth: Payer: Self-pay | Admitting: *Deleted

## 2022-10-12 ENCOUNTER — Other Ambulatory Visit: Payer: Self-pay

## 2022-10-12 DIAGNOSIS — C61 Malignant neoplasm of prostate: Secondary | ICD-10-CM

## 2022-10-12 NOTE — Telephone Encounter (Signed)
Called patient to inform of lab and weight for 10-19-22 @ [place] @ 12 pm and his Xofigo Inj. on 10-26-22- arrival time- 11:45 am @ [facility], spoke with patient and he is aware of these appts.

## 2022-10-14 ENCOUNTER — Telehealth: Payer: Self-pay | Admitting: Hematology

## 2022-10-14 NOTE — Telephone Encounter (Signed)
Contacted patient to scheduled appointments. Left message with appointment details and a call back number if patient had any questions or could not accommodate the time we provided.   

## 2022-10-18 ENCOUNTER — Telehealth: Payer: Self-pay | Admitting: *Deleted

## 2022-10-18 NOTE — Telephone Encounter (Signed)
Called patient to remind of lab and weight for 10-19-22 @ 12 pm @ [place], spoke with patient and he is aware of these appts.

## 2022-10-19 ENCOUNTER — Other Ambulatory Visit: Payer: Self-pay

## 2022-10-19 ENCOUNTER — Ambulatory Visit
Admission: RE | Admit: 2022-10-19 | Discharge: 2022-10-19 | Disposition: A | Discharge status: Home / Self Care | Payer: Medicare HMO | Source: Ambulatory Visit | Attending: Hematology | Admitting: Hematology

## 2022-10-19 DIAGNOSIS — C61 Malignant neoplasm of prostate: Secondary | ICD-10-CM | POA: Diagnosis not present

## 2022-10-19 LAB — CMP (CANCER CENTER ONLY)
ALT: 10 U/L (ref 0–44)
AST: 19 U/L (ref 15–41)
Albumin: 4 g/dL (ref 3.5–5.0)
Alkaline Phosphatase: 260 U/L — ABNORMAL HIGH (ref 38–126)
Anion gap: 8 (ref 5–15)
BUN: 34 mg/dL — ABNORMAL HIGH (ref 8–23)
CO2: 28 mmol/L (ref 22–32)
Calcium: 9.4 mg/dL (ref 8.9–10.3)
Chloride: 104 mmol/L (ref 98–111)
Creatinine: 1.27 mg/dL — ABNORMAL HIGH (ref 0.61–1.24)
GFR, Estimated: 56 mL/min — ABNORMAL LOW (ref >60)
Glucose, Bld: 133 mg/dL — ABNORMAL HIGH (ref 70–99)
Potassium: 4.1 mmol/L (ref 3.5–5.1)
Sodium: 140 mmol/L (ref 135–145)
Total Bilirubin: 0.4 mg/dL (ref 0.3–1.2)
Total Protein: 6.5 g/dL (ref 6.5–8.1)

## 2022-10-19 LAB — CBC WITH DIFFERENTIAL (CANCER CENTER ONLY)
Abs Immature Granulocytes: 0.03 10*3/uL (ref 0.00–0.07)
Basophils Absolute: 0 10*3/uL (ref 0.0–0.1)
Basophils Relative: 1 %
Eosinophils Absolute: 0.2 10*3/uL (ref 0.0–0.5)
Eosinophils Relative: 6 %
HCT: 26.9 % — ABNORMAL LOW (ref 39.0–52.0)
Hemoglobin: 8.8 g/dL — ABNORMAL LOW (ref 13.0–17.0)
Immature Granulocytes: 1 %
Lymphocytes Relative: 32 %
Lymphs Abs: 1.1 10*3/uL (ref 0.7–4.0)
MCH: 30.8 pg (ref 26.0–34.0)
MCHC: 32.7 g/dL (ref 30.0–36.0)
MCV: 94.1 fL (ref 80.0–100.0)
Monocytes Absolute: 0.5 10*3/uL (ref 0.1–1.0)
Monocytes Relative: 14 %
Neutro Abs: 1.6 10*3/uL — ABNORMAL LOW (ref 1.7–7.7)
Neutrophils Relative %: 46 %
Platelet Count: 247 10*3/uL (ref 150–400)
RBC: 2.86 MIL/uL — ABNORMAL LOW (ref 4.22–5.81)
RDW: 17.1 % — ABNORMAL HIGH (ref 11.5–15.5)
WBC Count: 3.5 10*3/uL — ABNORMAL LOW (ref 4.0–10.5)
nRBC: 0 % (ref 0.0–0.2)

## 2022-10-20 LAB — PROSTATE-SPECIFIC AG, SERUM (LABCORP): Prostate Specific Ag, Serum: 440 ng/mL — ABNORMAL HIGH (ref 0.0–4.0)

## 2022-10-22 ENCOUNTER — Telehealth: Payer: Self-pay | Admitting: *Deleted

## 2022-10-22 NOTE — Telephone Encounter (Signed)
Called patient to inform that his Xofigo Inj. for 10-26-22 due to his hemoglobin being low, he will be rescheduled in 4 weeks per Dr. Tammi Klippel, spoke with patient and he is aware of this

## 2022-10-25 ENCOUNTER — Telehealth: Payer: Self-pay | Admitting: *Deleted

## 2022-10-25 NOTE — Telephone Encounter (Signed)
CALLED PATIENT TO INFORM THAT HIS BLOOD COUNT ISSUE WILL BE ADDRESSED WITH DR. FENG IN THE NEXT FEW WEEKS INFORMED PATIENT THAT HE DOESN'T NEED AN INFUSION, BUT HIS BLOOD COUNTS IS A LITTLE TOO LOW FOR XOFIGO, SPOKE WITH PATIENT AND HE IS AWARE OF THIS INFO

## 2022-10-25 NOTE — Telephone Encounter (Signed)
Returned patient's phone call, lvm for a return call 

## 2022-10-25 NOTE — Telephone Encounter (Signed)
RETURNED PATIENT'S PHONE CALL, SPOKE WITH PATIENT. ?

## 2022-10-26 ENCOUNTER — Inpatient Hospital Stay (HOSPITAL_COMMUNITY): Admission: RE | Admit: 2022-10-26 | Payer: Medicare HMO | Source: Ambulatory Visit

## 2022-10-26 ENCOUNTER — Other Ambulatory Visit: Payer: Self-pay

## 2022-10-26 ENCOUNTER — Telehealth: Payer: Self-pay

## 2022-10-26 DIAGNOSIS — C61 Malignant neoplasm of prostate: Secondary | ICD-10-CM

## 2022-10-26 NOTE — Telephone Encounter (Signed)
This RN and Dr. Burr Medico received a staff message from Harrel Lemon, RN regarding a call she received from Dr. Ernestina Penna patient.  This pt is a former pt of Dr. Alen Blew who's care is with Dr. Burr Medico.  Dr. Burr Medico has not met pt yet but is scheduled to see Dr. Burr Medico on 11/05/2022.  I called and spoke with the pt regarding his call to [facility].  Pt was calling about Xofigo injection that he's getting from Dr. Johny Shears office.  The pt previously talked to Romie Jumper, RN regarding this medication.  Instructed pt to contact [facility] regarding his Xofigo injections and the bill he received regarding this medication.  Pt stated he was told that they were going to try to get him setup with patient assistance to help with the cost of the injection.  Informed pt to contact Enid Derry in Dr. Johny Shears office and this RN will also notify Enid Derry as well of his concerns.  Pt verbalized understanding and had no further questions.

## 2022-10-28 ENCOUNTER — Telehealth: Payer: Self-pay | Admitting: *Deleted

## 2022-10-28 NOTE — Telephone Encounter (Signed)
RETURNED PATIENT'S PHONE CALL, SPOKE WITH PATIENT. ?

## 2022-11-03 ENCOUNTER — Telehealth: Payer: Self-pay | Admitting: *Deleted

## 2022-11-03 NOTE — Telephone Encounter (Signed)
CALLED PATIENT TO INFORM OF LAB AND WEIGHT APPT. FOR 11-18-22 @ 12 PM @ [place]. FOR 11-25-22 @ 12 PM @ [facility], SPOKE WITH PATIENT AND HE IS AWARE OF THESE APPTS.

## 2022-11-05 ENCOUNTER — Other Ambulatory Visit: Payer: Self-pay

## 2022-11-05 ENCOUNTER — Inpatient Hospital Stay: Payer: Medicare HMO | Attending: Oncology | Admitting: Hematology

## 2022-11-05 ENCOUNTER — Encounter: Payer: Self-pay | Admitting: Hematology

## 2022-11-05 ENCOUNTER — Inpatient Hospital Stay: Payer: Medicare HMO

## 2022-11-05 VITALS — BP 152/82 | HR 87 | Temp 97.8°F | Resp 17 | Ht 70.5 in | Wt 161.1 lb

## 2022-11-05 DIAGNOSIS — Z888 Allergy status to other drugs, medicaments and biological substances status: Secondary | ICD-10-CM | POA: Insufficient documentation

## 2022-11-05 DIAGNOSIS — D649 Anemia, unspecified: Secondary | ICD-10-CM

## 2022-11-05 DIAGNOSIS — C7951 Secondary malignant neoplasm of bone: Secondary | ICD-10-CM | POA: Diagnosis not present

## 2022-11-05 DIAGNOSIS — D630 Anemia in neoplastic disease: Secondary | ICD-10-CM | POA: Insufficient documentation

## 2022-11-05 DIAGNOSIS — C61 Malignant neoplasm of prostate: Secondary | ICD-10-CM | POA: Diagnosis not present

## 2022-11-05 DIAGNOSIS — Z79899 Other long term (current) drug therapy: Secondary | ICD-10-CM | POA: Insufficient documentation

## 2022-11-05 LAB — CBC WITH DIFFERENTIAL (CANCER CENTER ONLY)
Abs Immature Granulocytes: 0.06 10*3/uL (ref 0.00–0.07)
Basophils Absolute: 0 10*3/uL (ref 0.0–0.1)
Basophils Relative: 1 %
Eosinophils Absolute: 0.3 10*3/uL (ref 0.0–0.5)
Eosinophils Relative: 7 %
HCT: 26.8 % — ABNORMAL LOW (ref 39.0–52.0)
Hemoglobin: 8.8 g/dL — ABNORMAL LOW (ref 13.0–17.0)
Immature Granulocytes: 2 %
Lymphocytes Relative: 24 %
Lymphs Abs: 0.9 10*3/uL (ref 0.7–4.0)
MCH: 31.4 pg (ref 26.0–34.0)
MCHC: 32.8 g/dL (ref 30.0–36.0)
MCV: 95.7 fL (ref 80.0–100.0)
Monocytes Absolute: 0.4 10*3/uL (ref 0.1–1.0)
Monocytes Relative: 11 %
Neutro Abs: 2 10*3/uL (ref 1.7–7.7)
Neutrophils Relative %: 55 %
Platelet Count: 260 10*3/uL (ref 150–400)
RBC: 2.8 MIL/uL — ABNORMAL LOW (ref 4.22–5.81)
RDW: 17.4 % — ABNORMAL HIGH (ref 11.5–15.5)
WBC Count: 3.6 10*3/uL — ABNORMAL LOW (ref 4.0–10.5)
nRBC: 0 % (ref 0.0–0.2)

## 2022-11-05 LAB — IRON AND IRON BINDING CAPACITY (CC-WL,HP ONLY)
Iron: 111 ug/dL (ref 45–182)
Saturation Ratios: 38 % (ref 17.9–39.5)
TIBC: 295 ug/dL (ref 250–450)
UIBC: 184 ug/dL (ref 117–376)

## 2022-11-05 LAB — CMP (CANCER CENTER ONLY)
ALT: 9 U/L (ref 0–44)
AST: 18 U/L (ref 15–41)
Albumin: 4.1 g/dL (ref 3.5–5.0)
Alkaline Phosphatase: 242 U/L — ABNORMAL HIGH (ref 38–126)
Anion gap: 9 (ref 5–15)
BUN: 36 mg/dL — ABNORMAL HIGH (ref 8–23)
CO2: 27 mmol/L (ref 22–32)
Calcium: 9.3 mg/dL (ref 8.9–10.3)
Chloride: 104 mmol/L (ref 98–111)
Creatinine: 1.47 mg/dL — ABNORMAL HIGH (ref 0.61–1.24)
GFR, Estimated: 47 mL/min — ABNORMAL LOW (ref >60)
Glucose, Bld: 179 mg/dL — ABNORMAL HIGH (ref 70–99)
Potassium: 3.7 mmol/L (ref 3.5–5.1)
Sodium: 140 mmol/L (ref 135–145)
Total Bilirubin: 0.3 mg/dL (ref 0.3–1.2)
Total Protein: 6.7 g/dL (ref 6.5–8.1)

## 2022-11-05 LAB — RETIC PANEL
Immature Retic Fract: 35.7 % — ABNORMAL HIGH (ref 2.3–15.9)
RBC.: 2.78 MIL/uL — ABNORMAL LOW (ref 4.22–5.81)
Retic Count, Absolute: 44.5 10*3/uL (ref 19.0–186.0)
Retic Ct Pct: 1.6 % (ref 0.4–3.1)
Reticulocyte Hemoglobin: 33.7 pg (ref >27.9)

## 2022-11-05 LAB — SAMPLE TO BLOOD BANK

## 2022-11-05 LAB — FERRITIN: Ferritin: 371 ng/mL — ABNORMAL HIGH (ref 24–336)

## 2022-11-05 LAB — VITAMIN B12: Vitamin B-12: 725 pg/mL (ref 180–914)

## 2022-11-05 LAB — FOLATE: Folate: 21.5 ng/mL (ref >5.9)

## 2022-11-05 MED ORDER — OXYCODONE HCL 5 MG PO TABS: 5.0000 mg | ORAL_TABLET | ORAL | 0 refills | Status: DC | PRN

## 2022-11-05 NOTE — Progress Notes (Addendum)
[facility]   [phone] [phone]   Clinic Follow up Note   Patient Care Team: Sheliah Hatch, MD as PCP - General (Family Medicine) Jodelle Red, MD as PCP - Cardiology (Cardiology) Cindie Laroche, MD as Referring Physician (Urology) Felicita Gage, RN Nurse Navigator as Registered Nurse (Medical Oncology) Malachy Mood, MD as Consulting Physician (Hematology and Oncology)  Date of Service:  11/05/2022  CHIEF COMPLAINT: f/u of prostate cancer  CURRENT THERAPY: Xofigo, under the care of Dr. Kathrynn Running    ASSESSMENT:   Wayne Perkins is a 82 y.o. adult with   Prostate cancer [facility]) -Stage IV with note and bone metastasis, diagnosed in 2021, castration resistant -Initially presented with localized disease and Gleason score 6 in 2015.  He received radiation treatment in 2015. -He started androgen deprivation therapy for biochemical recurrent disease subsequently, with PSA decreased down to 0.4 in 2018.  He received a intermittent ADT due to side effects. -He developed bone and lymph node metastasis in 2021, started Xtandi 160 mg daily in June 2021.  Therapy stopped after 3 months due to poor tolerance. -He started Zytiga 500 mg daily with prednisone 5 mg daily in May 2022.  Therapy discontinued in June 2022 due to poor tolerance.  He is only on Eligard every 3 months now. -He started [place] on September 29, 2022, he tolerated first treatment very well.  Second treatment was held a few weeks ago due to his worsening anemia. -His anemia is likely related to his bone metastasis from prostate cancer.  Will give blood transfusion if Hg<8 and let him continue Xofigo, next dose scheduled for March 28th.  -We reviewed other treatment options for metastatic prostate cancer, patient is not interested in chemotherapy, but is open to other biological agent.  Will check his genetics, to see if he is a candidate for PARP inhibitor down the road.     Anemia -Secondary to metastatic breast cancer to bone, Xofigo and anemia of chronic disease secondary to CKD -Repeated labs showed hemoglobin 8.8, stable.  Ferritin was slightly elevated, folate and B12 level normal.  Low reticulocyte count, no evidence of nutritional anemia.   PLAN: -lab today for anemia work up -I recommend him to proceed with second dose Xofigo as scheduled on March 28 -Will repeat CBC in 2 weeks, to see if he needs any blood transfusion -Lab and follow-up in 6 weeks   SUMMARY OF ONCOLOGIC HISTORY: Oncology History  Malignant neoplasm of prostate metastatic to bone (HCC)  07/31/2019 Initial Diagnosis   Malignant neoplasm of prostate metastatic to bone (HCC)   02/17/2021 Cancer Staging   Staging form: Prostate, AJCC 8th Edition - Clinical: Stage IVB (cTX, cNX, pM1b, Grade Group: 3) - Signed by Benjiman Core, MD on 02/17/2021 Gleason score: 7 Histologic grading system: 5 grade system      INTERVAL HISTORY:  Tyjuan Kostek is here for a follow up of prostate cancer. He was last seen by Dr. Clelia Croft in  on 08/25/2022. He presents to the clinic accompanied by his wife.  He was under the care of Dr. Clelia Croft, who has left the practice.  This is my first time to meet him. He feels well over, takes tylenol for pain, his pain is mainly in pelvic and hip area. Moderate fatigue, especially weakness in legs, but able to all ADLs and some house work No fall lately, last fall 2 months ago when he tripped  Appetite is good, weight stable  No other  concerns.     All other systems were reviewed with the patient and are negative.  MEDICAL HISTORY:  Past Medical History:  Diagnosis Date   Arthritis    Blood in stool    Childhood asthma    Diabetes mellitus without complication (HCC)    type II   Heart disease    Hypertension, essential    Prostate cancer (HCC)    Stroke [facility])     SURGICAL HISTORY: Past Surgical History:  Procedure Laterality Date   CORONARY  ANGIOPLASTY WITH STENT PLACEMENT     GANGLION CYST EXCISION Right    PROSTATE BIOPSY      I have reviewed the social history and family history with the patient and they are unchanged from previous note.  ALLERGIES:  is allergic to tramadol, glipizide, and statins.  MEDICATIONS:  Current Outpatient Medications  Medication Sig Dispense Refill   Alcohol Swabs (B-D SINGLE USE SWABS REGULAR) PADS      Blood Glucose Calibration (ACCU-CHEK AVIVA) SOLN      blood glucose meter kit and supplies Use once a day in the AM for fasting sugars. 1 each 0   Calcium Carb-Cholecalciferol (OYSTER SHELL CALCIUM W/D) 500-5 MG-MCG TABS TAKE 1 TABLET TWICE DAILY 180 tablet 10   calcium-vitamin D (OSCAL WITH D) 500-200 MG-UNIT tablet Take 1 tablet by mouth 2 (two) times daily. 60 tablet 3   Cholecalciferol (VITAMIN D3) 50 MCG (2000 UT) TABS Take by mouth.     clopidogrel (PLAVIX) 75 MG tablet TAKE 1 TABLET EVERY DAY 90 tablet 3   Dulaglutide (TRULICITY) 0.75 MG/0.5ML SOPN Inject 0.75 mg into the skin once a week. 4 pen 0   Ez Smart Blood Glucose Lancets MISC by Does not apply route. 100 each 3   glucose blood test strip Use as instructed 100 each 3   hydrochlorothiazide (HYDRODIURIL) 25 MG tablet TAKE 1 TABLET EVERY DAY 90 tablet 3   hydrOXYzine (ATARAX) 10 MG tablet TAKE 1 TABLET BY MOUTH 3 TIMES DAILY AS NEEDED FOR ITCHING. 270 tablet 1   isosorbide mononitrate (IMDUR) 30 MG 24 hr tablet Take 1 tablet (30 mg total) by mouth daily. 90 tablet 3   Multiple Vitamin (MULTI-DAY VITAMINS PO) Take by mouth daily at 6 (six) AM.     Multiple Vitamins-Minerals (PRESERVISION AREDS PO) Take by mouth daily at 6 (six) AM. Soft gels     NIFEdipine (PROCARDIA XL/NIFEDICAL-XL) 90 MG 24 hr tablet TAKE 1 TABLET EVERY DAY 90 tablet 1   nitroGLYCERIN (NITROSTAT) 0.4 MG SL tablet Place 1 tablet (0.4 mg total) under the tongue every 5 (five) minutes as needed for chest pain. 25 tablet 3   oxyCODONE (OXY IR/ROXICODONE) 5 MG immediate  release tablet Take 1 tablet (5 mg total) by mouth every 4 (four) hours as needed for severe pain. 30 tablet 0   polyethylene glycol (MIRALAX / GLYCOLAX) 17 g packet Take 17 g by mouth as needed for moderate constipation.     potassium chloride SA (KLOR-CON M) 20 MEQ tablet TAKE 1 TABLET TWICE DAILY 180 tablet 1   rosuvastatin (CRESTOR) 5 MG tablet TAKE 1 TABLET EVERY DAY 90 tablet 1   vitamin B-12 (CYANOCOBALAMIN) 1000 MCG tablet Take 1,000 mcg by mouth daily.     No current facility-administered medications for this visit.    PHYSICAL EXAMINATION: ECOG PERFORMANCE STATUS: 2 - Symptomatic, <50% confined to bed  Vitals:   11/05/22 1340  BP: (!) 152/82  Pulse: 87  Resp: 17  Temp: 97.8 F (36.6 C)  SpO2: 98%   Wt Readings from Last 3 Encounters:  11/05/22 161 lb 1.6 oz (73.1 kg)  09/10/22 159 lb 6 oz (72.3 kg)  08/25/22 162 lb 6.4 oz (73.7 kg)     GENERAL:alert, no distress and comfortable SKIN: skin color, texture, turgor are normal, no rashes or significant lesions EYES: normal, Conjunctiva are pink and non-injected, sclera clear NECK: supple, thyroid normal size, non-tender, without nodularity LYMPH:  no palpable lymphadenopathy in the cervical, axillary  LUNGS: clear to auscultation and percussion with normal breathing effort HEART: regular rate & rhythm and no murmurs and no lower extremity edema ABDOMEN:abdomen soft, non-tender and normal bowel sounds Musculoskeletal:no cyanosis of digits and no clubbing  NEURO: alert & oriented x 3 with fluent speech, no focal motor/sensory deficits  LABORATORY DATA:  I have reviewed the data as listed    Latest Ref Rng & Units 10/19/2022   11:59 AM 09/21/2022   12:08 PM 09/01/2022    8:15 AM  CBC  WBC 4.0 - 10.5 K/uL 3.5  5.6  4.9   Hemoglobin 13.0 - 17.0 g/dL 8.8  16.1  7.9   Hematocrit 39.0 - 52.0 % 26.9  31.0  25.1   Platelets 150 - 400 K/uL 247  301  274         Latest Ref Rng & Units 10/19/2022   11:59 AM 09/10/2022    10:43 AM 09/01/2022    8:15 AM  CMP  Glucose 70 - 99 mg/dL 096  045  409   BUN 8 - 23 mg/dL 34  24  27   Creatinine 0.61 - 1.24 mg/dL 8.11  9.14  7.82   Sodium 135 - 145 mmol/L 140  139  141   Potassium 3.5 - 5.1 mmol/L 4.1  4.0  3.6   Chloride 98 - 111 mmol/L 104  100  104   CO2 22 - 32 mmol/L 28  28  27    Calcium 8.9 - 10.3 mg/dL 9.4  95.6  9.5   Total Protein 6.5 - 8.1 g/dL 6.5   6.2   Total Bilirubin 0.3 - 1.2 mg/dL 0.4   0.3   Alkaline Phos 38 - 126 U/L 260   308   AST 15 - 41 U/L 19   32   ALT 0 - 44 U/L 10   11       RADIOGRAPHIC STUDIES: I have personally reviewed the radiological images as listed and agreed with the findings in the report. No results found.    Orders Placed This Encounter  Procedures   Ferritin    Standing Status:   Future    Standing Expiration Date:   11/05/2023   Iron and Iron Binding Capacity ([facility] only)    Standing Status:   Future    Standing Expiration Date:   11/05/2023   Retic Panel    Standing Status:   Future    Standing Expiration Date:   11/05/2023   Vitamin B12    Standing Status:   Future    Standing Expiration Date:   11/05/2023   Methylmalonic acid, serum    Standing Status:   Future    Standing Expiration Date:   11/05/2023   Folate, Serum    Standing Status:   Future    Standing Expiration Date:   11/05/2023   Sample to Blood Bank    Standing Status:   Future    Standing Expiration Date:   11/05/2023  All questions were answered. The patient knows to call the clinic with any problems, questions or concerns. No barriers to learning was detected. The total time spent in the appointment was 40 minutes.     Malachy Mood, MD 11/05/2022

## 2022-11-05 NOTE — Assessment & Plan Note (Signed)
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  He received radiation treatment in 2015. -He started androgen deprivation therapy for biochemical recurrent disease subsequently, with PSA decreased down to 0.4 in 2018.  He received a intermittent ADT due to side effects. -He developed bone and lymph node metastasis in 2021, started Xtandi 160 mg daily in June 2021.  Therapy stopped after 3 months due to poor tolerance. -He started Zytiga 500 mg daily with prednisone 5 mg daily in May 2022.  Therapy discontinued in June 2022 due to poor tolerance.  He is only on Eligard every 3 months now.

## 2022-11-06 ENCOUNTER — Encounter: Payer: Self-pay | Admitting: Hematology

## 2022-11-08 ENCOUNTER — Telehealth: Payer: Self-pay

## 2022-11-08 ENCOUNTER — Other Ambulatory Visit: Payer: Self-pay

## 2022-11-08 NOTE — Telephone Encounter (Signed)
-----   Message from Truitt Merle, MD sent at 11/06/2022  3:31 PM EST ----- Please let pt know his anemia work up result, no nutritional anemia, his anemia is likely related to prostate cancer and CKD, no blood transfusion for now, continue monitoring.  Truitt Merle

## 2022-11-08 NOTE — Telephone Encounter (Signed)
Spoke with pt via telephone regarding is labs.  Informed pt that Dr. Burr Medico reviewed his labs and does not feel he needs a blood transfusion at this time.  Informed pt that Dr. Burr Medico feels his anemia is related to his prostate cancer and CKD.  Informed pt that Dr. Burr Medico will continue to monitor his labs.  Pt verbalized understanding and had no further questions or concerns at this time.

## 2022-11-08 NOTE — Telephone Encounter (Signed)
Attempted to call pt regarding below message from MD. Pt's number has been disconnected. Called pt's wife and LVM for call back.

## 2022-11-11 ENCOUNTER — Other Ambulatory Visit: Payer: Self-pay | Admitting: Hematology

## 2022-11-11 ENCOUNTER — Other Ambulatory Visit: Payer: Self-pay

## 2022-11-11 LAB — METHYLMALONIC ACID, SERUM: Methylmalonic Acid, Quantitative: 329 nmol/L (ref 0–378)

## 2022-11-17 ENCOUNTER — Other Ambulatory Visit: Payer: Self-pay

## 2022-11-17 ENCOUNTER — Telehealth: Payer: Self-pay | Admitting: *Deleted

## 2022-11-17 DIAGNOSIS — C61 Malignant neoplasm of prostate: Secondary | ICD-10-CM

## 2022-11-17 DIAGNOSIS — Z79899 Other long term (current) drug therapy: Secondary | ICD-10-CM

## 2022-11-17 NOTE — Telephone Encounter (Signed)
Called patient to remind of lab and weight appt. for 11-18-22 @ 12 pm, spoke with patient and he is aware of these appts.

## 2022-11-18 ENCOUNTER — Other Ambulatory Visit: Payer: Self-pay

## 2022-11-18 ENCOUNTER — Ambulatory Visit
Admission: RE | Admit: 2022-11-18 | Discharge: 2022-11-18 | Disposition: A | Discharge status: Home / Self Care | Payer: Medicare HMO | Source: Ambulatory Visit | Attending: Hematology | Admitting: Hematology

## 2022-11-18 DIAGNOSIS — C7951 Secondary malignant neoplasm of bone: Secondary | ICD-10-CM | POA: Diagnosis not present

## 2022-11-18 DIAGNOSIS — C61 Malignant neoplasm of prostate: Secondary | ICD-10-CM | POA: Insufficient documentation

## 2022-11-18 LAB — CBC WITH DIFFERENTIAL (CANCER CENTER ONLY)
Abs Immature Granulocytes: 0.06 10*3/uL (ref 0.00–0.07)
Basophils Absolute: 0.1 10*3/uL (ref 0.0–0.1)
Basophils Relative: 1 %
Eosinophils Absolute: 0.2 10*3/uL (ref 0.0–0.5)
Eosinophils Relative: 5 %
HCT: 27.8 % — ABNORMAL LOW (ref 39.0–52.0)
Hemoglobin: 9.1 g/dL — ABNORMAL LOW (ref 13.0–17.0)
Immature Granulocytes: 1 %
Lymphocytes Relative: 25 %
Lymphs Abs: 1.1 10*3/uL (ref 0.7–4.0)
MCH: 31.3 pg (ref 26.0–34.0)
MCHC: 32.7 g/dL (ref 30.0–36.0)
MCV: 95.5 fL (ref 80.0–100.0)
Monocytes Absolute: 0.7 10*3/uL (ref 0.1–1.0)
Monocytes Relative: 15 %
Neutro Abs: 2.4 10*3/uL (ref 1.7–7.7)
Neutrophils Relative %: 53 %
Platelet Count: 246 10*3/uL (ref 150–400)
RBC: 2.91 MIL/uL — ABNORMAL LOW (ref 4.22–5.81)
RDW: 18.1 % — ABNORMAL HIGH (ref 11.5–15.5)
WBC Count: 4.6 10*3/uL (ref 4.0–10.5)
nRBC: 0 % (ref 0.0–0.2)

## 2022-11-19 LAB — PROSTATE-SPECIFIC AG, SERUM (LABCORP): Prostate Specific Ag, Serum: 452 ng/mL — ABNORMAL HIGH (ref 0.0–4.0)

## 2022-11-24 ENCOUNTER — Inpatient Hospital Stay: Payer: Medicare HMO

## 2022-11-24 ENCOUNTER — Other Ambulatory Visit: Payer: Self-pay

## 2022-11-24 ENCOUNTER — Telehealth: Payer: Self-pay | Admitting: *Deleted

## 2022-11-24 ENCOUNTER — Ambulatory Visit: Payer: Medicare HMO | Admitting: Hematology

## 2022-11-24 VITALS — BP 160/85 | HR 86 | Temp 97.6°F | Resp 16

## 2022-11-24 DIAGNOSIS — C61 Malignant neoplasm of prostate: Secondary | ICD-10-CM | POA: Diagnosis not present

## 2022-11-24 DIAGNOSIS — D630 Anemia in neoplastic disease: Secondary | ICD-10-CM | POA: Diagnosis not present

## 2022-11-24 DIAGNOSIS — Z888 Allergy status to other drugs, medicaments and biological substances status: Secondary | ICD-10-CM | POA: Diagnosis not present

## 2022-11-24 DIAGNOSIS — Z79899 Other long term (current) drug therapy: Secondary | ICD-10-CM | POA: Diagnosis not present

## 2022-11-24 DIAGNOSIS — C7951 Secondary malignant neoplasm of bone: Secondary | ICD-10-CM | POA: Diagnosis not present

## 2022-11-24 LAB — CMP (CANCER CENTER ONLY)
ALT: 12 U/L (ref 0–44)
AST: 26 U/L (ref 15–41)
Albumin: 4.3 g/dL (ref 3.5–5.0)
Alkaline Phosphatase: 285 U/L — ABNORMAL HIGH (ref 38–126)
Anion gap: 9 (ref 5–15)
BUN: 36 mg/dL — ABNORMAL HIGH (ref 8–23)
CO2: 27 mmol/L (ref 22–32)
Calcium: 9.4 mg/dL (ref 8.9–10.3)
Chloride: 103 mmol/L (ref 98–111)
Creatinine: 1.33 mg/dL — ABNORMAL HIGH (ref 0.61–1.24)
GFR, Estimated: 53 mL/min — ABNORMAL LOW (ref >60)
Glucose, Bld: 221 mg/dL — ABNORMAL HIGH (ref 70–99)
Potassium: 3.9 mmol/L (ref 3.5–5.1)
Sodium: 139 mmol/L (ref 135–145)
Total Bilirubin: 0.3 mg/dL (ref 0.3–1.2)
Total Protein: 7.1 g/dL (ref 6.5–8.1)

## 2022-11-24 LAB — CBC WITH DIFFERENTIAL (CANCER CENTER ONLY)
Abs Immature Granulocytes: 0.12 10*3/uL — ABNORMAL HIGH (ref 0.00–0.07)
Basophils Absolute: 0.1 10*3/uL (ref 0.0–0.1)
Basophils Relative: 1 %
Eosinophils Absolute: 0.3 10*3/uL (ref 0.0–0.5)
Eosinophils Relative: 6 %
HCT: 27.7 % — ABNORMAL LOW (ref 39.0–52.0)
Hemoglobin: 9 g/dL — ABNORMAL LOW (ref 13.0–17.0)
Immature Granulocytes: 3 %
Lymphocytes Relative: 21 %
Lymphs Abs: 1 10*3/uL (ref 0.7–4.0)
MCH: 31.4 pg (ref 26.0–34.0)
MCHC: 32.5 g/dL (ref 30.0–36.0)
MCV: 96.5 fL (ref 80.0–100.0)
Monocytes Absolute: 0.6 10*3/uL (ref 0.1–1.0)
Monocytes Relative: 12 %
Neutro Abs: 2.8 10*3/uL (ref 1.7–7.7)
Neutrophils Relative %: 57 %
Platelet Count: 259 10*3/uL (ref 150–400)
RBC: 2.87 MIL/uL — ABNORMAL LOW (ref 4.22–5.81)
RDW: 17.9 % — ABNORMAL HIGH (ref 11.5–15.5)
WBC Count: 4.8 10*3/uL (ref 4.0–10.5)
nRBC: 0 % (ref 0.0–0.2)

## 2022-11-24 MED ORDER — LEUPROLIDE ACETATE (3 MONTH) 22.5 MG ~~LOC~~ KIT
22.5000 mg | PACK | Freq: Once | SUBCUTANEOUS | Status: AC
  Administered 2022-11-24: 22.5 mg via SUBCUTANEOUS
  Filled 2022-11-24: qty 22.5

## 2022-11-24 NOTE — Telephone Encounter (Signed)
Called patient to remind of Xofigo Inj. for 11-25-22- arrival time- 11:45 am @ [facility], spoke with patient and he is aware of this appt.

## 2022-11-24 NOTE — Patient Instructions (Signed)
Leuprolide Suspension for Injection (Prostate Cancer) What is this medication? LEUPROLIDE (loo PROE lide) reduces the symptoms of prostate cancer. It works by decreasing levels of the hormone testosterone in the body. This prevents prostate cancer cells from spreading or growing. This medicine may be used for other purposes; ask your health care provider or pharmacist if you have questions. COMMON BRAND NAME(S): Eligard, Lupron Depot, Lupron Depot-Ped, Lutrate Depot, Viadur What should I tell my care team before I take this medication? They need to know if you have any of these conditions: Diabetes Heart disease Heart failure High or low levels of electrolytes, such as magnesium, potassium, or sodium in your blood Irregular heartbeat or rhythm Seizures An unusual or allergic reaction to leuprolide, other medications, foods, dyes, or preservatives Pregnant or trying to get pregnant Breast-feeding How should I use this medication? This medication is injected under the skin or into a muscle. It is given by your care team in a hospital or clinic setting. Talk to your care team about the use of this medication in children. Special care may be needed. Overdosage: If you think you have taken too much of this medicine contact a poison control center or emergency room at once. NOTE: This medicine is only for you. Do not share this medicine with others. What if I miss a dose? Keep appointments for follow-up doses. It is important not to miss your dose. Call your care team if you are unable to keep an appointment. What may interact with this medication? Do not take this medication with any of the following: Cisapride Dronedarone Ketoconazole Levoketoconazole Pimozide Thioridazine This medication may also interact with the following: Other medications that cause heart rhythm changes This list may not describe all possible interactions. Give your health care provider a list of all the medicines,  herbs, non-prescription drugs, or dietary supplements you use. Also tell them if you smoke, drink alcohol, or use illegal drugs. Some items may interact with your medicine. What should I watch for while using this medication? Visit your care team for regular checks on your progress. Tell your care team if your symptoms do not start to get better or if they get worse. This medication may increase blood sugar. The risk may be higher in patients who already have diabetes. Ask your care team what you can do to lower the risk of diabetes while taking this medication. This medication may cause infertility. Talk to your care team if you are concerned about your fertility. Heart attacks and strokes have been reported with the use of this medication. Get emergency help if you develop signs or symptoms of a heart attack or stroke. Talk to your care team about the risks and benefits of this medication. What side effects may I notice from receiving this medication? Side effects that you should report to your care team as soon as possible: Allergic reactions--skin rash, itching, hives, swelling of the face, lips, tongue, or throat Heart attack--pain or tightness in the chest, shoulders, arms, or jaw, nausea, shortness of breath, cold or clammy skin, feeling faint or lightheaded Heart rhythm changes--fast or irregular heartbeat, dizziness, feeling faint or lightheaded, chest pain, trouble breathing High blood sugar (hyperglycemia)--increased thirst or amount of urine, unusual weakness or fatigue, blurry vision Mood swings, irritability, hostility Seizures Stroke--sudden numbness or weakness of the face, arm, or leg, trouble speaking, confusion, trouble walking, loss of balance or coordination, dizziness, severe headache, change in vision Thoughts of suicide or self-harm, worsening mood, feelings of depression Side   effects that usually do not require medical attention (report to your care team if they continue or  are bothersome): Bone pain Change in sex drive or performance General discomfort and fatigue Hot flashes Muscle pain Pain, redness, or irritation at injection site Swelling of the ankles, hands, or feet This list may not describe all possible side effects. Call your doctor for medical advice about side effects. You may report side effects to FDA at 1-800-FDA-1088. Where should I keep my medication? This medication is given in a hospital or clinic. It will not be stored at home. NOTE: This sheet is a summary. It may not cover all possible information. If you have questions about this medicine, talk to your doctor, pharmacist, or health care provider.  2023 Elsevier/Gold Standard (2021-10-26 00:00:00)  

## 2022-11-25 ENCOUNTER — Ambulatory Visit (HOSPITAL_COMMUNITY)
Admission: RE | Admit: 2022-11-25 | Discharge: 2022-11-25 | Disposition: A | Discharge status: Home / Self Care | Payer: Medicare HMO | Source: Ambulatory Visit | Attending: Radiation Oncology | Admitting: Radiation Oncology

## 2022-11-25 DIAGNOSIS — Z191 Hormone sensitive malignancy status: Secondary | ICD-10-CM | POA: Diagnosis not present

## 2022-11-25 DIAGNOSIS — C7951 Secondary malignant neoplasm of bone: Secondary | ICD-10-CM | POA: Insufficient documentation

## 2022-11-25 DIAGNOSIS — C61 Malignant neoplasm of prostate: Secondary | ICD-10-CM | POA: Diagnosis not present

## 2022-11-25 MED ORDER — RADIUM RA 223 DICHLORIDE 30 MCCI/ML IV SOLN
108.3500 | Freq: Once | INTRAVENOUS | Status: AC | PRN
  Administered 2022-11-25: 108.35 via INTRAVENOUS

## 2022-11-26 NOTE — Progress Notes (Signed)
  Radiation Oncology         [phone] ________________________________  Name: Keino Wates MRN: [id number]  Date: 11/25/2022  DOB: 12-04-1940  Radium-223 Infusion Note  Diagnosis:  Castration resistant prostate cancer with painful bone involvement  Current Infusion:    2  Planned Infusions:  6  Narrative: Mr. Shawnee Petzold presented to nuclear medicine for treatment. His most recent blood counts were reviewed.  He remains a good candidate to proceed with Ra-223.  The patient was situated in an infusion suite with a contact barrier placed under his arm. Intravenous access was established, using sterile technique, and a normal saline infusion from a syringe was started.  Micro-dosimetry:  The prescribed radiation activity was assayed and confirmed to be within specified tolerance.  Special Treatment Procedure - Infusion:  The nuclear medicine technologist and I personally verified the dose activity to be delivered as specified in the written directive, and verified the patient identification via 2 separate methods.  The syringe containing the dose was attached to an intravenous access and the dose delivered over a minute. No complications were noted.  The total administered dose was 111.3 microcuries.   A saline flush of the line and the syringe that contained the isotope was then performed.  The residual radioactivity in the syringe was 2.95 microcuries, so the actual infused isotope activity was 108.35 microcuries.   Pressure was applied to the venipuncture site, and a compression bandage placed.   Radiation Safety personnel were present to perform the discharge survey, as detailed on their documentation.   After a short period of observation, the patient had his IV removed.  Impression:  The patient tolerated his infusion relatively well.  Plan:  The patient will return in one month for ongoing care.    ________________________________  Sheral Apley. Tammi Klippel, M.D.

## 2022-11-29 ENCOUNTER — Telehealth: Payer: Self-pay | Admitting: Pharmacist

## 2022-11-29 NOTE — Progress Notes (Signed)
Care Management & Coordination Services Pharmacy Team   Reason for Encounter: Chart prep for initial visit with CPP    Contacted patient to confirm in office appointment with Erskine Emery, PharmD on 12/01/22 at 9AM. Unsuccessful outreach. Left voicemail for patient to return call. Called X 2 and goes directly to VM  Do you have any problems getting your medications?  If yes what types of problems are you experiencing?   What is your top health concern you would like to discuss at your upcoming visit?   Have you seen any other providers since your last visit with PCP?    Chart review:  Recent office visits:  09/10/22 Neena Rhymes, MD - Family Medicine - Diabetes - Labs were ordered. No medication changes. Follow up in 3-4 months.   Recent consult visits:  11/05/22 Malachy Mood, MD - Prostate Cancer - Oncology - Labs were ordered. Repeat CBC in 2 weeks and follow up in 6 weeks.   08/25/22 Eli Hose, MD - Oncology - Prostate Cancer - Recommended Calcium and Vitamin D supplements. Follow up in 3 months.   08/17/22 Gillian Shields, NP - Cardiology - CAD - No medication changes. Follow up in 4 months.   08/09/22 Gillian Shields, NP - Cardiology - CAD - isosorbide mononitrate (IMDUR) 30 MG 24 hr tablet and nitroGLYCERIN (NITROSTAT) 0.4 MG SL tablet prescribed. Follow up in 1 week.   06/30/22 Jodelle Red MD - Cardiology - Hx of CVA - No medication changes. Follow up in 6 months.   Hospital visits:  None in previous 6 months   Star Rating Drugs:  Medication:   Last Fill: Day Supply  Rosuvastatin 5 MG tablet  10/07/22  90   Care Gaps: Annual wellness visit in last year? Yes done 06/04/23  If Diabetic: Last eye exam / retinopathy screening: due 04/22/23 Last diabetic foot exam: due 05/06/23  Future Appointments  Date Time Provider Department Center  12/01/2022  9:00 AM Erroll Luna, [facility] None  12/08/2022 10:00 AM Jodelle Red, MD [facility]  12/09/2022  10:00 AM Sheliah Hatch, MD [facility]  12/20/2022 11:15 AM [facility] None  12/20/2022 11:40 AM Malachy Mood, MD [facility] None  06/16/2023 10:00 AM [facility] ANNUAL WELLNESS VISIT [facility]    [employer], [address]

## 2022-12-01 ENCOUNTER — Telehealth: Payer: Self-pay | Admitting: *Deleted

## 2022-12-01 ENCOUNTER — Other Ambulatory Visit: Payer: Self-pay

## 2022-12-01 ENCOUNTER — Ambulatory Visit: Payer: Medicare HMO | Admitting: Pharmacist

## 2022-12-01 DIAGNOSIS — C61 Malignant neoplasm of prostate: Secondary | ICD-10-CM

## 2022-12-01 NOTE — Telephone Encounter (Signed)
Returned patient's phone call, spoke with patient 

## 2022-12-01 NOTE — Progress Notes (Signed)
Care Management & Coordination Services Pharmacy Note  12/02/2022 Name:  Wayne Perkins MRN:  [id number] DOB:  February 24, 1941  Summary: PharmD initial visit.  Patient with DM, prostate cancer with bone mets, HLD, and HTN.  Controlling chronic conditions well.  Tolerates Trulicity well and is affordable.  He has not had a recent DEXA scan but is undergoing scans with cancer treatment.  LDL slightly above goal for DM but with comorbid conditions acceptable.  Recommendations/Changes made from today's visit: Consider DEXA Monitor BP - was elevated last time in office  Follow up plan: FU 6 months   Subjective: Kiegan Mangino is an 82 y.o. year old adult who is a primary patient of [name], [name], MD.  The care coordination team was consulted for assistance with disease management and care coordination needs.    Engaged with patient by telephone for follow up visit.  Recent office visits:  09/10/22 Annye Asa, MD - Family Medicine - Diabetes - Labs were ordered. No medication changes. Follow up in 3-4 months.    Recent consult visits:  11/05/22 Truitt Merle, MD - Prostate [place] were ordered. Repeat CBC in 2 weeks and follow up in 6 weeks.    08/25/22 Zola Button, MD - Oncology - Prostate Cancer - Recommended Calcium and Vitamin D supplements. Follow up in 3 months.    08/17/22 Laurann Montana, NP - Cardiology - CAD - No medication changes. Follow up in 4 months.    08/09/22 Laurann Montana, NP - Cardiology - CAD - isosorbide mononitrate (IMDUR) 30 MG 24 hr tablet and nitroGLYCERIN (NITROSTAT) 0.4 MG SL tablet prescribed. Follow up in 1 week.    06/30/22 Buford Dresser MD - Cardiology - Hx of CVA - No medication changes. Follow up in 6 months.    Hospital visits:  None in previous 6 months   Objective:  Lab Results  Component Value Date   CREATININE 1.33 (H) 11/24/2022   BUN 36 (H) 11/24/2022   GFR 54.83 (L) 09/10/2022   GFRNONAA 53 (L) 11/24/2022   GFRAA 44  (L) 08/15/2020   NA 139 11/24/2022   K 3.9 11/24/2022   CALCIUM 9.4 11/24/2022   CO2 27 11/24/2022   GLUCOSE 221 (H) 11/24/2022    Lab Results  Component Value Date/Time   HGBA1C 7.2 (H) 09/10/2022 10:43 AM   HGBA1C 7.9 (H) 05/05/2022 08:55 AM   GFR 54.83 (L) 09/10/2022 10:43 AM   GFR 45.49 (L) 05/05/2022 08:55 AM   MICROALBUR 2.4 (H) 05/05/2022 08:55 AM   MICROALBUR 4.5 (H) 02/27/2021 01:42 PM    Last diabetic Eye exam:  Lab Results  Component Value Date/Time   HMDIABEYEEXA No Retinopathy 04/21/2022 12:00 AM    Last diabetic Foot exam: No results found for: "HMDIABFOOTEX"   Lab Results  Component Value Date   CHOL 180 05/05/2022   HDL 51.40 05/05/2022   LDLCALC 94 05/05/2022   LDLDIRECT 93.0 10/30/2021   TRIG 172.0 (H) 05/05/2022   CHOLHDL 3 05/05/2022       Latest Ref Rng & Units 11/24/2022   10:35 AM 11/05/2022    2:18 PM 10/19/2022   11:59 AM  Hepatic Function  Total Protein 6.5 - 8.1 g/dL 7.1  6.7  6.5   Albumin 3.5 - 5.0 g/dL 4.3  4.1  4.0   AST 15 - 41 U/L 26  18  19    ALT 0 - 44 U/L 12  9  10    Alk Phosphatase 38 - 126 U/L  285  242  260   Total Bilirubin 0.3 - 1.2 mg/dL 0.3  0.3  0.4     Lab Results  Component Value Date/Time   TSH 1.60 05/05/2022 08:55 AM   TSH 1.48 10/30/2021 01:21 PM       Latest Ref Rng & Units 11/24/2022   10:35 AM 11/18/2022   11:54 AM 11/05/2022    2:18 PM  CBC  WBC 4.0 - 10.5 K/uL 4.8  4.6  3.6   Hemoglobin 13.0 - 17.0 g/dL 9.0  9.1  8.8   Hematocrit 39.0 - 52.0 % 27.7  27.8  26.8   Platelets 150 - 400 K/uL 259  246  260     Lab Results  Component Value Date/Time   VITAMINB12 725 11/05/2022 02:18 PM   [id number] 07/31/2019 02:53 AM    Clinical ASCVD: Yes  The ASCVD Risk score (Arnett DK, et al., 2019) failed to calculate for the following reasons:   The 2019 ASCVD risk score is only valid for ages 65 to 19   The patient has a prior MI or stroke diagnosis       09/10/2022   10:13 AM 06/03/2022    8:24 AM  05/05/2022    8:16 AM  Depression screen PHQ 2/9  Decreased Interest 0 0 0  Down, Depressed, Hopeless 0 0 0  PHQ - 2 Score 0 0 0  Altered sleeping 0 0 0  Tired, decreased energy 2 0 1  Change in appetite 0 0 0  Feeling bad or failure about yourself  0 0 0  Trouble concentrating 0 0 0  Moving slowly or fidgety/restless 0 0 0  Suicidal thoughts 0 0 0  PHQ-9 Score 2 0 1  Difficult doing work/chores Not difficult at all Not difficult at all Not difficult at all     Social History   Tobacco Use  Smoking Status Never  Smokeless Tobacco Never  Tobacco Comments   as a teeenager   BP Readings from Last 3 Encounters:  11/24/22 (!) 160/85  11/05/22 (!) 152/82  09/10/22 116/70   Pulse Readings from Last 3 Encounters:  11/24/22 86  11/05/22 87  09/10/22 81   Wt Readings from Last 3 Encounters:  11/05/22 161 lb 1.6 oz (73.1 kg)  09/10/22 159 lb 6 oz (72.3 kg)  08/25/22 162 lb 6.4 oz (73.7 kg)   BMI Readings from Last 3 Encounters:  11/05/22 22.79 kg/m  09/10/22 22.54 kg/m  08/25/22 22.97 kg/m    Allergies  Allergen Reactions   Tramadol Other (See Comments)    Muscle pains and sensation of needles in muscles    Glipizide Itching   Statins Other (See Comments)    Causes narcolepsy    Medications Reviewed Today     Reviewed by Edythe Clarity, [facility] (Pharmacist) on 12/02/22 at 1054  Med List Status: <None>   Medication Order Taking? Sig Documenting Provider Last Dose Status Informant  Alcohol Swabs (B-D SINGLE USE SWABS REGULAR) PADS [id number] Yes  [provider] Taking Active   Blood Glucose Calibration (ACCU-CHEK AVIVA) SOLN [id number] Yes  [provider] Taking Active   blood glucose meter kit and supplies [id number] Yes Use once a day in the AM for fasting sugars. [name], MD Taking Active   Calcium Carb-Cholecalciferol (OYSTER SHELL CALCIUM W/D) 500-5 MG-MCG TABS [id number] Yes TAKE 1 TABLET TWICE DAILY Alen Blew Mathis Dad, MD Taking Active    calcium-vitamin D (OSCAL WITH D) 500-200 MG-UNIT tablet [id number]  No Take 1 tablet by mouth 2 (two) times daily.  Patient not taking: Reported on 12/01/2022   Wyatt Portela, MD Not Taking Active   Cholecalciferol (VITAMIN D3) 50 MCG (2000 UT) TABS [id number] Yes Take by mouth. [provider] Taking Active   clopidogrel (PLAVIX) 75 MG tablet [id number] Yes TAKE 1 TABLET EVERY DAY [name], MD Taking Active   Dulaglutide (TRULICITY) [id number] SOPN [id number] Yes Inject 0.75 mg into the skin once a week. [name], MD Taking Active   Ez Smart Blood Glucose Lancets MISC [id number] Yes by Does not apply route. [name], MD Taking Active   glucose blood test strip [id number] Yes Use as instructed [name], MD Taking Active   hydrochlorothiazide (HYDRODIURIL) 25 MG tablet [id number] Yes TAKE 1 TABLET EVERY DAY [name], MD Taking Active   hydrOXYzine (ATARAX) 10 MG tablet [id number]  TAKE 1 TABLET BY MOUTH 3 TIMES DAILY AS NEEDED FOR ITCHING. [name], MD  Active   isosorbide mononitrate (IMDUR) 30 MG 24 hr tablet [id number] Yes Take 1 tablet (30 mg total) by mouth daily. Loel Dubonnet, NP Taking Active   Multiple Vitamin (MULTI-DAY VITAMINS PO) [id number]  Take by mouth daily at 6 (six) AM. [provider]  Active   Multiple Vitamins-Minerals (PRESERVISION AREDS PO) [id number]  Take by mouth daily at 6 (six) AM. Soft gels [provider]  Active   NIFEdipine (PROCARDIA XL/NIFEDICAL-XL) 90 MG 24 hr tablet [id number] Yes TAKE 1 TABLET EVERY DAY [name], MD Taking Active   nitroGLYCERIN (NITROSTAT) 0.4 MG SL tablet [id number]  Place 1 tablet (0.4 mg total) under the tongue every 5 (five) minutes as needed for chest pain. Loel Dubonnet, NP  Expired 11/07/22 2359   oxyCODONE (OXY IR/ROXICODONE) 5 MG immediate release tablet [id number] Yes Take 1 tablet (5 mg total) by mouth every 4 (four) hours as needed for severe pain.  Truitt Merle, MD Taking Active   polyethylene glycol (MIRALAX / GLYCOLAX) 17 g packet [id number] Yes Take 17 g by mouth as needed for moderate constipation. [provider] Taking Active Self  potassium chloride SA (KLOR-CON M) 20 MEQ tablet [id number] Yes TAKE 1 TABLET TWICE DAILY [name], MD Taking Active   rosuvastatin (CRESTOR) 5 MG tablet [id number] Yes TAKE 1 TABLET EVERY DAY Buford Dresser, MD Taking Active   vitamin B-12 (CYANOCOBALAMIN) 1000 MCG tablet [id number] Yes Take 1,000 mcg by mouth daily. [provider] Taking Active             SDOH:  (Social Determinants of Health) assessments and interventions performed: Yes Financial Resource Strain: Low Risk  (12/02/2022)   Overall Financial Resource Strain (CARDIA)    Difficulty of Paying Living Expenses: Not hard at all   Food Insecurity: No Food Insecurity (12/02/2022)   Hunger Vital Sign    Worried About Running Out of Food in the Last Year: Never true    [place] in the Last Year: Never true    SDOH Interventions    Flowsheet Row Clinical Support from 06/03/2022 in [place] at [place] Interventions   Food Insecurity Interventions Intervention Not Indicated  Housing Interventions Intervention Not Indicated  Transportation Interventions Intervention Not Indicated  Financial Strain Interventions Intervention Not Indicated  Physical Activity Interventions Intervention Not Indicated  Stress Interventions Intervention Not Indicated  Social Connections Interventions Intervention Not Indicated       Medication Assistance: None  required.  Patient affirms current coverage meets needs.  Medication Access: Within the past 30 days, how often has patient missed a dose of medication? 0 Is a pillbox or other method used to improve adherence? Yes  Factors that may affect medication adherence? no barriers identified Are meds synced by current pharmacy? Yes   Are meds delivered by current pharmacy? Yes  Does patient experience delays in picking up medications due to transportation concerns? No   [facility] Reviewed: Is patient disadvantaged to use [facility]?: Yes  Current Rx insurance plan: [employer] Name and location of Current pharmacy:  [place], [address] [place] [address] Phone: [phone] Fax: [phone]  [id number] [place], [address] [place] [address] Phone: [phone] Fax: [place] [place] [address] Phone: [phone] Fax: [phone]  [place], [address] [place] [address] Phone: [phone] Fax: [phone]  [facility] services reviewed with patient today?: Yes  Patient requests to transfer care to [facility]?: No  Reason patient declined to change pharmacies: Disadvantaged due to insurance/mail order  Compliance/Adherence/Medication fill history:  Rosuvastatin 5 MG tablet       10/07/22              90     Care Gaps: Annual wellness visit in last year? Yes done 06/04/23   If Diabetic: Last eye exam / retinopathy screening: due 04/22/23 Last diabetic foot exam: due 05/06/23   Assessment/Plan   Hypertension (BP goal <130/80) -Controlled -Current treatment: HCTZ 25mg  Appropriate, Effective, Safe, Accessible Nifedipine XL 90mg  Appropriate, Effective, Safe, Accessible Isosorbide mononitrate 30mg  24hr tabs Appropriate, Effective, Safe, Accessible -Medications previously tried: none noted  -Current home readings: BP normal at home, was elevated in office last couple times -Current dietary habits: none noted -Current exercise habits:  -Denies hypotensive/hypertensive symptoms -Educated on BP goals and benefits of  medications for prevention of heart attack, stroke and kidney damage; Symptoms of hypotension and importance of maintaining adequate hydration; -Counseled to monitor BP at home a few times weekly, document, and provide log at future appointments -Recommended to continue current medication  Hyperlipidemia: (LDL goal < 70) -Not ideally controlled, most recent LDL is 94 -Current treatment: Rosuvastatin 5mg  Appropriate, Query effective, ,  -Medications previously tried: none noted   -Educated on Cholesterol goals;  Benefits of statin for ASCVD risk reduction; Importance of limiting foods high in cholesterol; -Recommended to continue current medication -Tolerates statin well, LDL not quite at goal for DM - for now continue as is due to comorbid conditions. Continue routine lipid screenings.  Diabetes (A1c goal <8%) -Controlled, takes Trulicity on Wednesday's Most recent A1c is 7.4% -Current medications: Trulicity .75mg /0.5mg  Appropriate, Effective, Safe, Accessible -Medications previously tried: Willey Blade  -Current home glucose readings: fasting glucose: no logs discussed post prandial glucose: no logs discussed - not checking regularly -Denies hypoglycemic/hyperglycemic symptoms  -Educated on A1c and blood sugar goals; Complications of diabetes including kidney damage, retinal damage, and cardiovascular disease; -Counseled to check feet daily and get yearly eye exams -Counseled on diet and exercise extensively Recommended to continue current medication -Copay is affordable, counseled on PAP programs should he need copay assistance.  Prostate Cancer w/ metastasis  -Controlled -Current treatment  Oxycodone 5mg  q4h prn pain Appropriate, Effective, Safe, Accessible Tylenol  500mg  prn Appropriate, Effective, Safe, Accessible -Medications previously tried: none noted  -Usually only takes one per day in the evening to help him get to sleep. -other than that, pain is  controlled with consistent tylenol use.  He knows that the max dose is 3g/24hours. -Recommended to continue current medication  Beverly Milch, PharmD Clinical Pharmacist  [facility] [phone]

## 2022-12-01 NOTE — Telephone Encounter (Signed)
Called patient to give info regarding Xofigo inj., spoke with patient

## 2022-12-06 ENCOUNTER — Telehealth: Payer: Self-pay

## 2022-12-06 ENCOUNTER — Other Ambulatory Visit: Payer: Self-pay

## 2022-12-06 ENCOUNTER — Other Ambulatory Visit: Payer: Self-pay | Admitting: Hematology

## 2022-12-06 DIAGNOSIS — C61 Malignant neoplasm of prostate: Secondary | ICD-10-CM

## 2022-12-06 MED ORDER — OXYCODONE HCL 5 MG PO TABS: 5.0000 mg | ORAL_TABLET | Freq: Four times a day (QID) | ORAL | 0 refills | Status: DC | PRN

## 2022-12-06 NOTE — Telephone Encounter (Signed)
Pt called requesting a refill on his Oxycodone 5mg .  Pt would like the prescription sent to [facility] in [place], [place].  Informed pt that Dr. [name] will send the refill before the close of business today.  Notified Dr. [name] of the pt's call and request.

## 2022-12-07 NOTE — Progress Notes (Signed)
Cardiology Office Note:    Date:  12/08/2022   ID:  Wayne Perkins, DOB 1941/03/01, MRN [id number]  PCP:  Sheliah Hatch, MD  Cardiologist:  Jodelle Red, MD  Referring MD: Sheliah Hatch, MD   CC: follow up  History of Present Illness:    Wayne Perkins is a 82 y.o. adult with a hx of TIA/CVA, CAD s/p prior stents, hypertension, type II diabetes with nephropathy/CKD, hyperlipidemia, malignant prostate cancer who is seen for follow up today. I initially met him 11/28/20 as a new consult at the request of Sheliah Hatch, MD for the evaluation and management of ASCVD and hypertension.  CV history: -cardiologist at [facility] in [place]. Had PCI in 2004 to "the left side" by Dr. Marlan Palau at [facility] and brings angiogram pictures of his RCA pre/post PCI 2010 by Dr. Cory Roughen at [facility].  -Angina symptom in 2004 was bilateral hand numbness, felt like he was going to pass out. Lost consciousness, was defibrillated by EMS, urgently went to cath lab. In 2010, felt fatigued, couldn't eat. Felt heaviness in his chest. Called EMS, brought to hospital, had cath the day after admission.   On 07/03/2021 he called the office and reported intermittent chest pain for the past 6 months. He noted it felt like heartburn and happens mostly at night, worsening in the month prior.   At a prior visit he reported chest discomfort/heartburn when he wakes up in the morning. After drinking milk his symptoms resolved within 10 minutes. He was managing well despite his LE weakness. He complained of occasional shortness of breath. He remained on tylenol due to his prostate cancer.  At his last visit on 06/30/2022, he continued to have heartburn every now and then resolved with drinking milk. At times he experienced a pulling or tightening type of chest pain that he attributed to his lipoma on his chest.  Additionally he was suffering from severe myalgias which he mostly attributed to mowing the lawn on  Monday. He believed that the jarring and bumping from mowing was causing severe, persistent pain as if he just came out of a football game. This aching pain was exacerbated when he would try to move. A couple days prior to this visit he had significant right-sided chest pain that "felt like a deep bruise." He has a heating pad that would provide some relief while sitting in his chair. He reported relief from this pain within 2 hours. After mowing the lawn 3 weeks prior he "had 5 days of misery." Of note, prior to his past 2 times of mowing, his muscle aches would not be nearly as limiting.  He had prn oxycodone for severe pain when tylenol is ineffective. He had only needed to take oxycodone a few times, but never more than 1 tablet in a day.  In clinic his blood pressure was elevated at 156/74. He attributed this to his recent myalgias as described above. At home he usually saw readings around 135-145/60, and sometimes as low as 125/62.  Today, he complains of shortness of breath, chest pressure/heaviness, and bilateral LE myalgias with exertion. He states he has noticed low stamina for some time now and while walking he often feels as though he has to stop or slow down. After usual activities ,such as mowing the lawn, he feels very exhausted and sore as if he "got beat up". He has bilateral calf myalgias and hip pain, with his left hip pain worsening at nighttime. He takes 1 oxycodone  2 hours before bed which helps with pain throughout the night. He tends to manage his pain during the day by taking 325 mg Tylenol every 1.5 hours, which work within 10 minutes. He notes his chest pressure/heaviness does not feel similar to past chest pain.   He monitors his blood pressure at home regularly. He took it this morning and reports a reading of 143/71. He states it occasionally will be in the 120s/60-70s.  He has prostate cancer and is unsure if this could be contributing to his symptoms. He has not been taking  his chemotherapy drugs.   He denies any palpitations or peripheral edema. No lightheadedness, headaches, syncope, orthopnea, or PND.  Past Medical History:  Diagnosis Date   Arthritis    Blood in stool    Childhood asthma    Diabetes mellitus without complication    type II   Heart disease    Hypertension, essential    Prostate cancer    Stroke     Past Surgical History:  Procedure Laterality Date   CORONARY ANGIOPLASTY WITH STENT PLACEMENT     GANGLION CYST EXCISION Right    PROSTATE BIOPSY      Current Medications: Current Outpatient Medications on File Prior to Visit  Medication Sig   Alcohol Swabs (B-D SINGLE USE SWABS REGULAR) PADS    Blood Glucose Calibration (ACCU-CHEK AVIVA) SOLN    blood glucose meter kit and supplies Use once a day in the AM for fasting sugars.   Calcium Carb-Cholecalciferol (OYSTER SHELL CALCIUM W/D) 500-5 MG-MCG TABS TAKE 1 TABLET TWICE DAILY   calcium-vitamin D (OSCAL WITH D) 500-200 MG-UNIT tablet Take 1 tablet by mouth 2 (two) times daily.   Cholecalciferol (VITAMIN D3) 50 MCG (2000 UT) TABS Take by mouth.   clopidogrel (PLAVIX) 75 MG tablet TAKE 1 TABLET EVERY DAY   Dulaglutide (TRULICITY) 0.75 MG/0.5ML SOPN Inject 0.75 mg into the skin once a week.   Ez Smart Blood Glucose Lancets MISC by Does not apply route.   glucose blood test strip Use as instructed   hydrochlorothiazide (HYDRODIURIL) 25 MG tablet TAKE 1 TABLET EVERY DAY   hydrOXYzine (ATARAX) 10 MG tablet TAKE 1 TABLET BY MOUTH 3 TIMES DAILY AS NEEDED FOR ITCHING.   isosorbide mononitrate (IMDUR) 30 MG 24 hr tablet Take 1 tablet (30 mg total) by mouth daily.   Multiple Vitamin (MULTI-DAY VITAMINS PO) Take by mouth daily at 6 (six) AM.   Multiple Vitamins-Minerals (PRESERVISION AREDS PO) Take by mouth daily at 6 (six) AM. Soft gels   NIFEdipine (PROCARDIA XL/NIFEDICAL-XL) 90 MG 24 hr tablet TAKE 1 TABLET EVERY DAY   oxyCODONE (OXY IR/ROXICODONE) 5 MG immediate release tablet Take 1  tablet (5 mg total) by mouth every 6 (six) hours as needed for severe pain.   polyethylene glycol (MIRALAX / GLYCOLAX) 17 g packet Take 17 g by mouth as needed for moderate constipation.   potassium chloride SA (KLOR-CON M) 20 MEQ tablet TAKE 1 TABLET TWICE DAILY   rosuvastatin (CRESTOR) 5 MG tablet TAKE 1 TABLET EVERY DAY   vitamin B-12 (CYANOCOBALAMIN) 1000 MCG tablet Take 1,000 mcg by mouth daily.   nitroGLYCERIN (NITROSTAT) 0.4 MG SL tablet Place 1 tablet (0.4 mg total) under the tongue every 5 (five) minutes as needed for chest pain.   No current facility-administered medications on file prior to visit.     Allergies:   Tramadol, Glipizide, and Statins   Social History   Tobacco Use   Smoking status: Never  Smokeless tobacco: Never   Tobacco comments:    as a teeenager  Vaping Use   Vaping Use: Never used  Substance Use Topics   Alcohol use: Not Currently   Drug use: Never    Family History: family history includes Depression in his brother; Early death in his brother; Heart disease in his brother; Hyperlipidemia in his mother and sister; Hypertension in his mother; Mental illness in his brother; Stroke in his father. There is no history of Prostate cancer, Breast cancer, Colon cancer, or Pancreatic cancer.  ROS:   Please see the history of present illness.   (+) Shortness of breath (+) Chest pressure/heaviness (+) Myalgias (bilateral LE) (+) Hip pain - left hip worse at night Additional pertinent ROS otherwise unremarkable.  EKGs/Labs/Other Studies Reviewed:    The following studies were reviewed today:  Echo 08/01/19 1. Left ventricular ejection fraction, by visual estimation, is 60 to  65%. The left ventricle has normal function. There is mildly increased  left ventricular hypertrophy.   2. Left ventricular diastolic parameters are consistent with Grade I  diastolic dysfunction (impaired relaxation).   3. Global right ventricle has normal systolic function.The  right  ventricular size is normal. No increase in right ventricular wall  thickness.   4. Left atrial size was normal.   5. Right atrial size was normal.   6. Presence of pericardial fat pad.   7. Mild aortic valve annular calcification.   8. The mitral valve is grossly normal. Mild mitral valve regurgitation.   9. The tricuspid valve is grossly normal. Tricuspid valve regurgitation  is mild.  10. The aortic valve is tricuspid. Aortic valve regurgitation is not  visualized.  11. The pulmonic valve was grossly normal. Pulmonic valve regurgitation is  trivial.  12. The ascending aorta was not well visualized.  13. TR signal is inadequate for assessing pulmonary artery systolic  pressure.  14. The inferior vena cava is normal in size with greater than 50%  respiratory variability, suggesting right atrial pressure of 3 mmHg.   Bilateral Carotid Dopplers 07/31/2019:   RIGHT CAROTID ARTERY: Mild amount of calcified plaque present at the level of the carotid bulb and proximal right ICA. Estimated right ICA stenosis is less than 50%.   RIGHT VERTEBRAL ARTERY: Antegrade flow with normal waveform and velocity.   LEFT CAROTID ARTERY: Minimal partially calcified plaque at the level of the distal bulb and proximal left ICA. Estimated left ICA stenosis is less than 50%.   LEFT VERTEBRAL ARTERY: Antegrade flow with normal waveform and velocity.   IMPRESSION: Mild plaque at the level of both carotid bulbs and proximal internal carotid arteries. No significant carotid stenosis identified with estimated bilateral ICA stenoses of less than 50%.  EKG:  EKG is personally reviewed.   12/08/2022: EKG was not ordered. 06/30/2022: EKG was not ordered. 10/23/2021: NSR at 81 bpm 12/04/2020: sinus rhythm with 1st degree AV block, 82 bpm  Recent Labs: 05/05/2022: TSH 1.60 11/24/2022: ALT 12; BUN 36; Creatinine 1.33; Hemoglobin 9.0; Platelet Count 259; Potassium 3.9; Sodium 139   Recent Lipid Panel     Component Value Date/Time   CHOL 180 05/05/2022 0855   TRIG 172.0 (H) 05/05/2022 0855   HDL 51.40 05/05/2022 0855   CHOLHDL 3 05/05/2022 0855   VLDL 34.4 05/05/2022 0855   LDLCALC 94 05/05/2022 0855   LDLCALC 159 (H) 08/15/2020 1123   LDLDIRECT 93.0 10/30/2021 1321    Physical Exam:    VS:  BP (!) 154/70  Pulse 87   Ht 5' 10.5" (1.791 m)   Wt 164 lb (74.4 kg)   BMI 23.20 kg/m     Wt Readings from Last 3 Encounters:  12/08/22 164 lb (74.4 kg)  11/05/22 161 lb 1.6 oz (73.1 kg)  09/10/22 159 lb 6 oz (72.3 kg)    GEN: Well nourished, well developed in no acute distress HEENT: Normal, moist mucous membranes NECK: No JVD CARDIAC: regular rhythm, normal S1 and S2, no rubs or gallops. No murmur. VASCULAR: Radial and DP pulses 2+ bilaterally. No carotid bruits RESPIRATORY:  Clear to auscultation without rales, wheezing or rhonchi  ABDOMEN: Soft, non-tender, non-distended MUSCULOSKELETAL:  Ambulates independently SKIN: Warm and dry, no edema NEUROLOGIC:  Alert and oriented x 3. No focal neuro deficits noted. PSYCHIATRIC:  Normal affect    ASSESSMENT:    1. Chest pain of uncertain etiology   2. Coronary artery disease of native artery of native heart with stable angina pectoris   3. Hyperlipidemia LDL goal <70   4. Hypertension, essential   5. Type 2 diabetes with nephropathy   6. Mixed hyperlipidemia    PLAN:    Chest pain -discussed treadmill stress, nuclear stress/lexiscan, and CT coronary angiography. Discussed pros and cons of each, including but not limited to false positive/false negative risk, radiation risk, and risk of IV contrast dye. Based on shared decision making, decision was made to pursue nuclear stress test -we discussed cath for abnormal stress. He would only like this if severely abnormal. -reviewed red flag warning signs that need immediate medical attention   History of TIA/CVA History of CAD with prior stents Mixed hyperlipidemia Type II  diabetes, with nephropathy Hypertension -does not tolerate beta blockers -continue clopidogrel -tolerating low dose rosuvastatin. Labs from 05/05/22 show LDL 94, TG 172 -continue dulaglutide given diabetes and ASCVD. SGLT2i would be another good option given ASCVD and diabetic nephropathy, could likely discontinue HCTZ with that.  -BP above goal today, but in pain. Reports good control at home -Tolerates nifedipine. Would consider ARB given diabetic nephropathy, he wishes to continue current medications for now  Cardiac risk counseling and prevention recommendations: -recommend heart healthy/Mediterranean diet, with whole grains, fruits, vegetable, fish, lean meats, nuts, and olive oil. Limit salt. -recommend moderate walking, 3-5 times/week for 30-50 minutes each session. Aim for at least 150 minutes.week. Goal should be pace of 3 miles/hours, or walking 1.5 miles in 30 minutes -recommend avoidance of tobacco products. Avoid excess alcohol.  Plan for follow up: 6 weeks, or sooner as needed  Jodelle Red, MD, PhD, [facility]  [facility]    Medication Adjustments/Labs and Tests Ordered: Current medicines are reviewed at length with the patient today.  Concerns regarding medicines are outlined above.   Orders Placed This Encounter  Procedures   Cardiac Stress Test: Informed Consent Details: Physician/Practitioner Attestation; Transcribe to consent form and obtain patient signature   MYOCARDIAL PERFUSION IMAGING   No orders of the defined types were placed in this encounter.  Patient Instructions  Medication Instructions:  N/A *If you need a refill on your cardiac medications before your next appointment, please call your pharmacy*  Lab Work: N/A  Testing/Procedures:  Pension scheme manager (Nuclear Stress test)  Your physician has requested that you have a lexiscan myoview. For further information please visit [email or web address] Please follow instruction sheet, as given.    Follow-Up: At [facility], you and your health needs are our priority.  As part of our continuing mission to provide you with  exceptional heart care, we have created designated Provider Care Teams.  These Care Teams include your primary Cardiologist (physician) and Advanced Practice Providers (APPs -  Physician Assistants and Nurse Practitioners) who all work together to provide you with the care you need, when you need it.  We recommend signing up for the patient portal called "MyChart".  Sign up information is provided on this After Visit Summary.  MyChart is used to connect with patients for Virtual Visits (Telemedicine).  Patients are able to view lab/test results, encounter notes, upcoming appointments, etc.  Non-urgent messages can be sent to your provider as well.   To learn more about what you can do with MyChart, go to [email or web address].    Your next appointment:   6 week(s)  Provider:   Jodelle Red, MD    Other Instructions You will receive a call with further instructions a few days before your study.       I,Rachel Rivera,acting as a Neurosurgeon for [employer], MD.,have documented all relevant documentation on the behalf of Jodelle Red, MD,as directed by  Jodelle Red, MD while in the presence of Jodelle Red, MD.  I, Jodelle Red, MD, have reviewed all documentation for this visit. The documentation on 12/23/22 for the exam, diagnosis, procedures, and orders are all accurate and complete.   Signed, Jodelle Red, MD PhD 12/08/2022     [facility]

## 2022-12-08 ENCOUNTER — Encounter (HOSPITAL_BASED_OUTPATIENT_CLINIC_OR_DEPARTMENT_OTHER): Payer: Self-pay | Admitting: Cardiology

## 2022-12-08 ENCOUNTER — Ambulatory Visit (INDEPENDENT_AMBULATORY_CARE_PROVIDER_SITE_OTHER): Payer: Medicare HMO | Admitting: Cardiology

## 2022-12-08 VITALS — BP 154/70 | HR 87 | Ht 70.5 in | Wt 164.0 lb

## 2022-12-08 DIAGNOSIS — E1121 Type 2 diabetes mellitus with diabetic nephropathy: Secondary | ICD-10-CM | POA: Diagnosis not present

## 2022-12-08 DIAGNOSIS — R079 Chest pain, unspecified: Secondary | ICD-10-CM

## 2022-12-08 DIAGNOSIS — I25118 Atherosclerotic heart disease of native coronary artery with other forms of angina pectoris: Secondary | ICD-10-CM

## 2022-12-08 DIAGNOSIS — E782 Mixed hyperlipidemia: Secondary | ICD-10-CM

## 2022-12-08 DIAGNOSIS — I10 Essential (primary) hypertension: Secondary | ICD-10-CM

## 2022-12-08 DIAGNOSIS — E785 Hyperlipidemia, unspecified: Secondary | ICD-10-CM | POA: Diagnosis not present

## 2022-12-08 NOTE — Patient Instructions (Signed)
Medication Instructions:  N/A *If you need a refill on your cardiac medications before your next appointment, please call your pharmacy*  Lab Work: N/A  Testing/Procedures:  Pension scheme manager (Nuclear Stress test)  Your physician has requested that you have a lexiscan myoview. For further information please visit [email or web address] Please follow instruction sheet, as given.   Follow-Up: At [facility], you and your health needs are our priority.  As part of our continuing mission to provide you with exceptional heart care, we have created designated Provider Care Teams.  These Care Teams include your primary Cardiologist (physician) and Advanced Practice Providers (APPs -  Physician Assistants and Nurse Practitioners) who all work together to provide you with the care you need, when you need it.  We recommend signing up for the patient portal called "MyChart".  Sign up information is provided on this After Visit Summary.  MyChart is used to connect with patients for Virtual Visits (Telemedicine).  Patients are able to view lab/test results, encounter notes, upcoming appointments, etc.  Non-urgent messages can be sent to your provider as well.   To learn more about what you can do with MyChart, go to [email or web address].    Your next appointment:   6 week(s)  Provider:   Jodelle Red, MD    Other Instructions You will receive a call with further instructions a few days before your study.

## 2022-12-09 ENCOUNTER — Ambulatory Visit (INDEPENDENT_AMBULATORY_CARE_PROVIDER_SITE_OTHER): Payer: Medicare HMO | Admitting: Family Medicine

## 2022-12-09 ENCOUNTER — Encounter: Payer: Self-pay | Admitting: Family Medicine

## 2022-12-09 VITALS — BP 138/72 | HR 87 | Temp 98.2°F | Ht 70.0 in | Wt 164.4 lb

## 2022-12-09 DIAGNOSIS — Z0000 Encounter for general adult medical examination without abnormal findings: Secondary | ICD-10-CM | POA: Diagnosis not present

## 2022-12-09 DIAGNOSIS — E1121 Type 2 diabetes mellitus with diabetic nephropathy: Secondary | ICD-10-CM

## 2022-12-09 LAB — CBC WITH DIFFERENTIAL/PLATELET
Basophils Absolute: 0 10*3/uL (ref 0.0–0.1)
Basophils Relative: 1.3 % (ref 0.0–3.0)
Eosinophils Absolute: 0.2 10*3/uL (ref 0.0–0.7)
Eosinophils Relative: 7 % — ABNORMAL HIGH (ref 0.0–5.0)
HCT: 27.1 % — ABNORMAL LOW (ref 39.0–52.0)
Hemoglobin: 9 g/dL — ABNORMAL LOW (ref 13.0–17.0)
Lymphocytes Relative: 23.7 % (ref 12.0–46.0)
Lymphs Abs: 0.7 10*3/uL (ref 0.7–4.0)
MCHC: 33.1 g/dL (ref 30.0–36.0)
MCV: 95.5 fl (ref 78.0–100.0)
Monocytes Absolute: 0.4 10*3/uL (ref 0.1–1.0)
Monocytes Relative: 14.6 % — ABNORMAL HIGH (ref 3.0–12.0)
Neutro Abs: 1.5 10*3/uL (ref 1.4–7.7)
Neutrophils Relative %: 53.4 % (ref 43.0–77.0)
Platelets: 268 10*3/uL (ref 150.0–400.0)
RBC: 2.84 Mil/uL — ABNORMAL LOW (ref 4.22–5.81)
RDW: 19.5 % — ABNORMAL HIGH (ref 11.5–15.5)
WBC: 2.9 10*3/uL — ABNORMAL LOW (ref 4.0–10.5)

## 2022-12-09 LAB — LIPID PANEL
Cholesterol: 190 mg/dL (ref 0–200)
HDL: 51.8 mg/dL (ref >39.00)
LDL Cholesterol: 99 mg/dL (ref 0–99)
NonHDL: 137.94
Total CHOL/HDL Ratio: 4
Triglycerides: 196 mg/dL — ABNORMAL HIGH (ref 0.0–149.0)
VLDL: 39.2 mg/dL (ref 0.0–40.0)

## 2022-12-09 LAB — BASIC METABOLIC PANEL
BUN: 34 mg/dL — ABNORMAL HIGH (ref 6–23)
CO2: 28 mEq/L (ref 19–32)
Calcium: 9.8 mg/dL (ref 8.4–10.5)
Chloride: 104 mEq/L (ref 96–112)
Creatinine, Ser: 1.3 mg/dL (ref 0.40–1.50)
GFR: 51.21 mL/min — ABNORMAL LOW (ref >60.00)
Glucose, Bld: 120 mg/dL — ABNORMAL HIGH (ref 70–99)
Potassium: 4.6 mEq/L (ref 3.5–5.1)
Sodium: 141 mEq/L (ref 135–145)

## 2022-12-09 LAB — HEPATIC FUNCTION PANEL
ALT: 10 U/L (ref 0–53)
AST: 22 U/L (ref 0–37)
Albumin: 4.3 g/dL (ref 3.5–5.2)
Alkaline Phosphatase: 233 U/L — ABNORMAL HIGH (ref 39–117)
Bilirubin, Direct: 0.1 mg/dL (ref 0.0–0.3)
Total Bilirubin: 0.4 mg/dL (ref 0.2–1.2)
Total Protein: 6.7 g/dL (ref 6.0–8.3)

## 2022-12-09 LAB — TSH: TSH: 1.46 u[IU]/mL (ref 0.35–5.50)

## 2022-12-09 LAB — HEMOGLOBIN A1C: Hgb A1c MFr Bld: 7.3 % — ABNORMAL HIGH (ref 4.6–6.5)

## 2022-12-09 NOTE — Assessment & Plan Note (Signed)
Pt's PE unchanged from previous.  UTD on eye exam, foot exam, microalbumin.  Declines DEXA, PNA.  Check labs.  Anticipatory guidance provided.

## 2022-12-09 NOTE — Patient Instructions (Signed)
Follow up in 6 months to recheck sugar, blood pressure, and cholesterol We'll notify you of your lab results and make any changes if needed Continue to work on low carb diet and regular physical activity Call with any questions or concerns Stay Safe!  Stay Healthy! Happy Spring!!!

## 2022-12-09 NOTE — Assessment & Plan Note (Signed)
Chronic problem.  UTD on eye exam, foot exam, microalbumin.  Check labs.  Adjust meds prn  

## 2022-12-09 NOTE — Progress Notes (Signed)
   Subjective:    Patient ID: Wayne Perkins, adult    DOB: 1941/07/12, 82 y.o.   MRN: [id number]  HPI CPE- UTD on eye exam, foot exam, microalbumin.  No longer doing colonoscopy.  Declines PNA and DEXA  Patient Care Team    Relationship Specialty Notifications Start End  Sheliah Hatch, MD PCP - General Family Medicine  04/23/21   Jodelle Red, MD PCP - Cardiology Cardiology  07/29/22   Cindie Laroche, MD Referring Physician Urology  04/19/19   Felicita Gage, RN Nurse Navigator Registered Nurse Medical Oncology  02/08/20   Malachy Mood, MD Consulting Physician Hematology and Oncology  09/21/22     Health Maintenance  Topic Date Due   DEXA SCAN  Never done   Pneumonia Vaccine 98+ Years old (1 of 1 - PCV) 05/06/2023 (Originally 07/31/2005)   HEMOGLOBIN A1C  03/11/2023   INFLUENZA VACCINE  03/31/2023   OPHTHALMOLOGY EXAM  04/22/2023   Diabetic kidney evaluation - Urine ACR  05/06/2023   FOOT EXAM  05/06/2023   Medicare Annual Wellness (AWV)  06/04/2023   Diabetic kidney evaluation - eGFR measurement  11/24/2023   HPV VACCINES  Aged Out   DTaP/Tdap/Td  Discontinued   COVID-19 Vaccine  Discontinued   Zoster Vaccines- Shingrix  Discontinued      Review of Systems Patient reports no vision/hearing changes, anorexia, fever ,adenopathy, persistant/recurrent hoarseness, swallowing issues, chest pain, palpitations, edema, persistant/recurrent cough, hemoptysis, dyspnea (rest,exertional, paroxysmal nocturnal), gastrointestinal  bleeding (melena, rectal bleeding), abdominal pain, excessive heart burn, GU symptoms (dysuria, hematuria, voiding/incontinence issues) syncope, focal weakness, memory loss, numbness & tingling, skin/hair/nail changes, depression, anxiety, abnormal bruising/bleeding, musculoskeletal symptoms/signs.     Objective:   Physical Exam General Appearance:    Alert, cooperative, no distress, appears stated age  Head:    Normocephalic, without obvious abnormality,  atraumatic  Eyes:    PERRL, conjunctiva/corneas clear, EOM's intact both eyes       Ears:    Normal TM's and external ear canals, both ears  Nose:   Nares normal, septum midline, mucosa normal, no drainage   or sinus tenderness  Throat:   Lips, mucosa, and tongue normal; teeth and gums normal  Neck:   Supple, symmetrical, trachea midline, no adenopathy;       thyroid:  No enlargement/tenderness/nodules  Back:     Symmetric, no curvature, ROM normal, no CVA tenderness  Lungs:     Clear to auscultation bilaterally, respirations unlabored  Chest wall:    No tenderness or deformity  Heart:    Regular rate and rhythm, S1 and S2 normal, no murmur, rub   or gallop  Abdomen:     Soft, non-tender, bowel sounds active all four quadrants,    no masses, no organomegaly  Genitalia:    deferred  Rectal:    Extremities:   Extremities normal, atraumatic, no cyanosis or edema  Pulses:   2+ and symmetric all extremities  Skin:   Skin color, texture, turgor normal, no rashes or lesions  Lymph nodes:   Cervical, supraclavicular, and axillary nodes normal  Neurologic:   CNII-XII intact. Normal strength, sensation and reflexes      throughout          Assessment & Plan:

## 2022-12-10 ENCOUNTER — Telehealth: Payer: Self-pay

## 2022-12-10 NOTE — Telephone Encounter (Signed)
Pt aware of lab results 

## 2022-12-10 NOTE — Telephone Encounter (Signed)
-----   Message from Sheliah Hatch, MD sent at 12/10/2022 10:38 AM EDT ----- Labs are stable.  No changes at this time

## 2022-12-13 ENCOUNTER — Telehealth (HOSPITAL_COMMUNITY): Payer: Self-pay | Admitting: *Deleted

## 2022-12-13 ENCOUNTER — Telehealth: Payer: Self-pay | Admitting: *Deleted

## 2022-12-13 NOTE — Telephone Encounter (Signed)
Returned patient's phone call, spoke with patient 

## 2022-12-13 NOTE — Telephone Encounter (Signed)
Patient given detailed instructions per Myocardial Perfusion Study Information Sheet for the test on 12/21/22 Patient notified to arrive 15 minutes early and that it is imperative to arrive on time for appointment to keep from having the test rescheduled.  If you need to cancel or reschedule your appointment, please call the office within 24 hours of your appointment. . Patient verbalized understanding. Wayne Perkins Jacqueline   

## 2022-12-14 ENCOUNTER — Other Ambulatory Visit: Payer: Self-pay

## 2022-12-14 ENCOUNTER — Telehealth: Payer: Self-pay

## 2022-12-14 ENCOUNTER — Telehealth: Payer: Self-pay | Admitting: *Deleted

## 2022-12-14 NOTE — Telephone Encounter (Signed)
Pt LVM stating he's scheduled for an appt on 12/20/2022 and does not know why he's scheduled.  Pt stated he's always been scheduled every 3 months and he will be most likely canceling this appt.  Returned pt's call but received pt's voicemail.  LVM stating that the appts on 12/20/2022 are for lab and f/u with Dr. [name] since pt was last seen by Dr. [name] on 11/05/2022.  Stated that Dr. [name] sees her pt's every 4 to 6 wk especially if they are actively getting treatment or on pain medications.  Stated that based off Dr. Latanya Maudlin last office note, pt had some anemia that Dr. [name] was also trying to monitor.  Instructed pt to keep the appt that is scheduled on 12/20/2022 especially since he's on Xofigo and pain medication (Oxycodone).  Instructed pt to contact Dr. Latanya Maudlin office if further discussion is needed.

## 2022-12-14 NOTE — Telephone Encounter (Signed)
RETURNED PATIENT'S PHONE CALL, LVM FOR A RETURN CALL 

## 2022-12-15 ENCOUNTER — Other Ambulatory Visit: Payer: Self-pay

## 2022-12-15 ENCOUNTER — Ambulatory Visit
Admission: RE | Admit: 2022-12-15 | Discharge: 2022-12-15 | Disposition: A | Discharge status: Home / Self Care | Payer: Medicare HMO | Source: Ambulatory Visit | Attending: Hematology | Admitting: Hematology

## 2022-12-15 DIAGNOSIS — C7951 Secondary malignant neoplasm of bone: Secondary | ICD-10-CM | POA: Diagnosis not present

## 2022-12-15 DIAGNOSIS — C61 Malignant neoplasm of prostate: Secondary | ICD-10-CM | POA: Diagnosis not present

## 2022-12-15 LAB — CBC WITH DIFFERENTIAL (CANCER CENTER ONLY)
Abs Immature Granulocytes: 0.02 10*3/uL (ref 0.00–0.07)
Basophils Absolute: 0 10*3/uL (ref 0.0–0.1)
Basophils Relative: 1 %
Eosinophils Absolute: 0.2 10*3/uL (ref 0.0–0.5)
Eosinophils Relative: 6 %
HCT: 26.9 % — ABNORMAL LOW (ref 39.0–52.0)
Hemoglobin: 8.8 g/dL — ABNORMAL LOW (ref 13.0–17.0)
Immature Granulocytes: 1 %
Lymphocytes Relative: 30 %
Lymphs Abs: 1.1 10*3/uL (ref 0.7–4.0)
MCH: 32.1 pg (ref 26.0–34.0)
MCHC: 32.7 g/dL (ref 30.0–36.0)
MCV: 98.2 fL (ref 80.0–100.0)
Monocytes Absolute: 0.6 10*3/uL (ref 0.1–1.0)
Monocytes Relative: 18 %
Neutro Abs: 1.6 10*3/uL — ABNORMAL LOW (ref 1.7–7.7)
Neutrophils Relative %: 44 %
Platelet Count: 204 10*3/uL (ref 150–400)
RBC: 2.74 MIL/uL — ABNORMAL LOW (ref 4.22–5.81)
RDW: 17.5 % — ABNORMAL HIGH (ref 11.5–15.5)
WBC Count: 3.6 10*3/uL — ABNORMAL LOW (ref 4.0–10.5)
nRBC: 0 % (ref 0.0–0.2)

## 2022-12-15 LAB — CMP (CANCER CENTER ONLY)
ALT: 10 U/L (ref 0–44)
AST: 23 U/L (ref 15–41)
Albumin: 4.1 g/dL (ref 3.5–5.0)
Alkaline Phosphatase: 211 U/L — ABNORMAL HIGH (ref 38–126)
Anion gap: 7 (ref 5–15)
BUN: 32 mg/dL — ABNORMAL HIGH (ref 8–23)
CO2: 30 mmol/L (ref 22–32)
Calcium: 9.5 mg/dL (ref 8.9–10.3)
Chloride: 105 mmol/L (ref 98–111)
Creatinine: 1.37 mg/dL — ABNORMAL HIGH (ref 0.61–1.24)
GFR, Estimated: 52 mL/min — ABNORMAL LOW (ref >60)
Glucose, Bld: 105 mg/dL — ABNORMAL HIGH (ref 70–99)
Potassium: 4.1 mmol/L (ref 3.5–5.1)
Sodium: 142 mmol/L (ref 135–145)
Total Bilirubin: 0.3 mg/dL (ref 0.3–1.2)
Total Protein: 6.7 g/dL (ref 6.5–8.1)

## 2022-12-20 ENCOUNTER — Other Ambulatory Visit: Payer: Medicare HMO

## 2022-12-20 ENCOUNTER — Telehealth: Payer: Self-pay | Admitting: Cardiology

## 2022-12-20 ENCOUNTER — Ambulatory Visit: Payer: Medicare HMO | Admitting: Hematology

## 2022-12-20 ENCOUNTER — Telehealth: Payer: Self-pay

## 2022-12-20 NOTE — Telephone Encounter (Addendum)
Tried to call patient on all the numbers listed in his contacts none of them worked so I sent him a message in [place] [facility] to give him the message below as per Dr. [name].    ----- Message from Malachy Mood, MD sent at 12/16/2022  8:04 AM EDT ----- Please let pt know his lab results, anemia is stable, no need for blood transfusion. No other new concerns, thx   Malachy Mood

## 2022-12-20 NOTE — Telephone Encounter (Signed)
Returned call to patient, went straight to VM, follow up mychart message sent to patient.

## 2022-12-20 NOTE — Telephone Encounter (Signed)
Patient is requesting to speak with Dr. Cristal Deer. Patient stated they had questions in regards to getting their stress test.

## 2022-12-21 ENCOUNTER — Ambulatory Visit (HOSPITAL_COMMUNITY): Payer: Medicare HMO

## 2022-12-21 ENCOUNTER — Telehealth: Payer: Self-pay | Admitting: *Deleted

## 2022-12-21 DIAGNOSIS — T300 Burn of unspecified body region, unspecified degree: Secondary | ICD-10-CM | POA: Diagnosis not present

## 2022-12-21 DIAGNOSIS — R109 Unspecified abdominal pain: Secondary | ICD-10-CM | POA: Diagnosis not present

## 2022-12-21 DIAGNOSIS — K5900 Constipation, unspecified: Secondary | ICD-10-CM | POA: Diagnosis not present

## 2022-12-21 DIAGNOSIS — R339 Retention of urine, unspecified: Secondary | ICD-10-CM | POA: Diagnosis not present

## 2022-12-21 DIAGNOSIS — I10 Essential (primary) hypertension: Secondary | ICD-10-CM | POA: Diagnosis not present

## 2022-12-21 NOTE — Telephone Encounter (Signed)
2nd call attempt to patient,  Patient states he wanted to ask Dr. Cristal Deer if it is possible for this to induce a heart attack. Explained procedure to the patient and explained that he will be very strictly monitored. He expresses he had problems with the phone system yesterday when he tried to call back. Assured he we are aware and are doing our best to improve the system.

## 2022-12-21 NOTE — Telephone Encounter (Signed)
CALLED PATIENT TO REMIND OF XOFIGO INJ. FOR 12-22-22- ARRIVAL TIME- 1:45 PM @ [facility], LVM FOR A RETURN CALL

## 2022-12-22 ENCOUNTER — Ambulatory Visit (HOSPITAL_COMMUNITY)
Admission: RE | Admit: 2022-12-22 | Discharge: 2022-12-22 | Disposition: A | Discharge status: Home / Self Care | Payer: Medicare HMO | Source: Ambulatory Visit | Attending: Radiation Oncology | Admitting: Radiation Oncology

## 2022-12-22 ENCOUNTER — Other Ambulatory Visit (HOSPITAL_BASED_OUTPATIENT_CLINIC_OR_DEPARTMENT_OTHER): Payer: Self-pay | Admitting: Cardiology

## 2022-12-22 DIAGNOSIS — C7951 Secondary malignant neoplasm of bone: Secondary | ICD-10-CM | POA: Insufficient documentation

## 2022-12-22 DIAGNOSIS — C61 Malignant neoplasm of prostate: Secondary | ICD-10-CM | POA: Diagnosis not present

## 2022-12-22 DIAGNOSIS — E782 Mixed hyperlipidemia: Secondary | ICD-10-CM

## 2022-12-22 DIAGNOSIS — I2510 Atherosclerotic heart disease of native coronary artery without angina pectoris: Secondary | ICD-10-CM

## 2022-12-22 DIAGNOSIS — Z191 Hormone sensitive malignancy status: Secondary | ICD-10-CM | POA: Diagnosis not present

## 2022-12-22 MED ORDER — RADIUM RA 223 DICHLORIDE 30 MCCI/ML IV SOLN
106.4700 | Freq: Once | INTRAVENOUS | Status: AC
  Administered 2022-12-22: 106.47 via INTRAVENOUS

## 2022-12-22 NOTE — Telephone Encounter (Signed)
Rx(s) sent to pharmacy electronically.  

## 2022-12-23 ENCOUNTER — Encounter (HOSPITAL_BASED_OUTPATIENT_CLINIC_OR_DEPARTMENT_OTHER): Payer: Self-pay | Admitting: Cardiology

## 2022-12-23 ENCOUNTER — Other Ambulatory Visit: Payer: Self-pay | Admitting: Family Medicine

## 2022-12-27 ENCOUNTER — Telehealth: Payer: Self-pay

## 2022-12-27 ENCOUNTER — Other Ambulatory Visit: Payer: Self-pay | Admitting: Hematology

## 2022-12-27 DIAGNOSIS — C61 Malignant neoplasm of prostate: Secondary | ICD-10-CM

## 2022-12-27 MED ORDER — OXYCODONE HCL 5 MG PO TABS
5.0000 mg | ORAL_TABLET | Freq: Four times a day (QID) | ORAL | 0 refills | Status: DC | PRN
Start: 2022-12-27 — End: 2022-12-28

## 2022-12-27 NOTE — Telephone Encounter (Signed)
Pt called requesting a refill on his Oxycodone.  Pt requested if refill could be sent to [facility] in [place], [place].  Notified Dr. [name] of the pt's request for pain medication refill.

## 2022-12-28 ENCOUNTER — Other Ambulatory Visit: Payer: Self-pay | Admitting: Hematology

## 2022-12-28 ENCOUNTER — Other Ambulatory Visit: Payer: Self-pay

## 2022-12-28 DIAGNOSIS — C61 Malignant neoplasm of prostate: Secondary | ICD-10-CM

## 2022-12-28 MED ORDER — OXYCODONE HCL 5 MG PO TABS
5.0000 mg | ORAL_TABLET | Freq: Four times a day (QID) | ORAL | 0 refills | Status: DC | PRN
Start: 2022-12-28 — End: 2023-01-21

## 2022-12-29 ENCOUNTER — Telehealth: Payer: Self-pay | Admitting: *Deleted

## 2022-12-29 ENCOUNTER — Other Ambulatory Visit: Payer: Self-pay

## 2022-12-29 NOTE — Telephone Encounter (Signed)
RETURNED PATIENT'S PHONE CALL, SPOKE WITH PATIENT. ?

## 2022-12-30 NOTE — Progress Notes (Signed)
  Radiation Oncology         [phone] ________________________________  Name: Wayne Perkins MRN: [id number]  Date: 12/22/2022  DOB: October 21, 1940  Radium-223 Infusion Note  Diagnosis:  Castration resistant prostate cancer with painful bone involvement  Current Infusion:    3  Planned Infusions:  6  Narrative: Wayne Perkins presented to nuclear medicine for treatment. His most recent blood counts were reviewed.  He remains a good candidate to proceed with Ra-223.  The patient was situated in an infusion suite with a contact barrier placed under his arm. Intravenous access was established, using sterile technique, and a normal saline infusion from a syringe was started.  Micro-dosimetry:  The prescribed radiation activity was assayed and confirmed to be within specified tolerance.  Special Treatment Procedure - Infusion:  The nuclear medicine technologist and I personally verified the dose activity to be delivered as specified in the written directive, and verified the patient identification via 2 separate methods.  The syringe containing the dose was attached to an intravenous access and the dose delivered over a minute. No complications were noted.  The total administered dose was 110.8 microcuries.   A saline flush of the line and the syringe that contained the isotope was then performed.  The residual radioactivity in the syringe was 4.33 microcuries, so the actual infused isotope activity was 106.47 microcuries.   Pressure was applied to the venipuncture site, and a compression bandage placed.   Radiation Safety personnel were present to perform the discharge survey, as detailed on their documentation.   After a short period of observation, the patient had his IV removed.  Impression:  The patient tolerated his infusion relatively well.  Plan:  The patient will return in one month for ongoing care.    ________________________________  Artist Pais. Kathrynn Running, M.D.

## 2023-01-03 ENCOUNTER — Telehealth: Payer: Self-pay | Admitting: *Deleted

## 2023-01-03 ENCOUNTER — Other Ambulatory Visit: Payer: Self-pay

## 2023-01-03 DIAGNOSIS — C7951 Secondary malignant neoplasm of bone: Secondary | ICD-10-CM

## 2023-01-03 NOTE — Telephone Encounter (Signed)
CALLED PATIENT TO INFORM OF LAB AND XOFIGO INJECTION, SPOKE WITH PATIENT AND HE IS AWARE OF THESE APPTS.

## 2023-01-07 ENCOUNTER — Telehealth: Payer: Self-pay

## 2023-01-07 NOTE — Telephone Encounter (Signed)
Patient dropped off paperwork to be completed placed in the front file with chrage sheet attached, patient would like this faxed back to the number on form, and but would like a call to inform him it has been done

## 2023-01-07 NOTE — Telephone Encounter (Signed)
Forms are in Dr Beverely Low to be signed folder

## 2023-01-11 NOTE — Telephone Encounter (Signed)
They are in Dr Beverely Low to be signed folder I did let her know that she did not prescribe this medication for pt and the day the pt dropped the forms off I called him to inform him of that information as well .

## 2023-01-11 NOTE — Telephone Encounter (Signed)
Pt asking for an update says the deadline is approaching, I informed him of the timeframe and stated we would call him let him know once they are completed and faxed

## 2023-01-12 NOTE — Telephone Encounter (Signed)
Forms faxed and they are in my blue folder to the left of my lap top

## 2023-01-12 NOTE — Telephone Encounter (Signed)
Forms completed and returned to [facility] to be faxed.  These were dropped off on Friday and completed within 3 business days

## 2023-01-19 ENCOUNTER — Ambulatory Visit: Payer: Medicare HMO

## 2023-01-20 ENCOUNTER — Telehealth: Payer: Self-pay | Admitting: *Deleted

## 2023-01-20 NOTE — Telephone Encounter (Signed)
Called patient to remind of lab and weight for 01-21-23, spoke with patient and he is aware of this appt.

## 2023-01-21 ENCOUNTER — Inpatient Hospital Stay (HOSPITAL_BASED_OUTPATIENT_CLINIC_OR_DEPARTMENT_OTHER): Payer: Medicare HMO | Admitting: Hematology

## 2023-01-21 ENCOUNTER — Encounter: Payer: Self-pay | Admitting: Hematology

## 2023-01-21 ENCOUNTER — Inpatient Hospital Stay: Payer: Medicare HMO | Attending: Oncology

## 2023-01-21 ENCOUNTER — Ambulatory Visit: Payer: Medicare HMO

## 2023-01-21 ENCOUNTER — Ambulatory Visit: Payer: Medicare HMO | Attending: Hematology

## 2023-01-21 VITALS — BP 149/77 | HR 76 | Temp 97.6°F | Resp 18 | Wt 159.9 lb

## 2023-01-21 DIAGNOSIS — D631 Anemia in chronic kidney disease: Secondary | ICD-10-CM | POA: Insufficient documentation

## 2023-01-21 DIAGNOSIS — C61 Malignant neoplasm of prostate: Secondary | ICD-10-CM

## 2023-01-21 DIAGNOSIS — N189 Chronic kidney disease, unspecified: Secondary | ICD-10-CM | POA: Diagnosis not present

## 2023-01-21 DIAGNOSIS — C7951 Secondary malignant neoplasm of bone: Secondary | ICD-10-CM | POA: Insufficient documentation

## 2023-01-21 DIAGNOSIS — Z79899 Other long term (current) drug therapy: Secondary | ICD-10-CM | POA: Diagnosis not present

## 2023-01-21 DIAGNOSIS — D649 Anemia, unspecified: Secondary | ICD-10-CM | POA: Diagnosis not present

## 2023-01-21 LAB — CMP (CANCER CENTER ONLY)
ALT: 12 U/L (ref 0–44)
AST: 24 U/L (ref 15–41)
Albumin: 4.4 g/dL (ref 3.5–5.0)
Alkaline Phosphatase: 175 U/L — ABNORMAL HIGH (ref 38–126)
Anion gap: 8 (ref 5–15)
BUN: 36 mg/dL — ABNORMAL HIGH (ref 8–23)
CO2: 28 mmol/L (ref 22–32)
Calcium: 9.6 mg/dL (ref 8.9–10.3)
Chloride: 103 mmol/L (ref 98–111)
Creatinine: 1.47 mg/dL — ABNORMAL HIGH (ref 0.61–1.24)
GFR, Estimated: 47 mL/min — ABNORMAL LOW (ref >60)
Glucose, Bld: 148 mg/dL — ABNORMAL HIGH (ref 70–99)
Potassium: 3.8 mmol/L (ref 3.5–5.1)
Sodium: 139 mmol/L (ref 135–145)
Total Bilirubin: 0.4 mg/dL (ref 0.3–1.2)
Total Protein: 6.8 g/dL (ref 6.5–8.1)

## 2023-01-21 LAB — CBC WITH DIFFERENTIAL (CANCER CENTER ONLY)
Abs Immature Granulocytes: 0.04 10*3/uL (ref 0.00–0.07)
Basophils Absolute: 0 10*3/uL (ref 0.0–0.1)
Basophils Relative: 1 %
Eosinophils Absolute: 0.2 10*3/uL (ref 0.0–0.5)
Eosinophils Relative: 4 %
HCT: 27.7 % — ABNORMAL LOW (ref 39.0–52.0)
Hemoglobin: 9.1 g/dL — ABNORMAL LOW (ref 13.0–17.0)
Immature Granulocytes: 1 %
Lymphocytes Relative: 19 %
Lymphs Abs: 0.8 10*3/uL (ref 0.7–4.0)
MCH: 31.9 pg (ref 26.0–34.0)
MCHC: 32.9 g/dL (ref 30.0–36.0)
MCV: 97.2 fL (ref 80.0–100.0)
Monocytes Absolute: 0.6 10*3/uL (ref 0.1–1.0)
Monocytes Relative: 14 %
Neutro Abs: 2.5 10*3/uL (ref 1.7–7.7)
Neutrophils Relative %: 61 %
Platelet Count: 218 10*3/uL (ref 150–400)
RBC: 2.85 MIL/uL — ABNORMAL LOW (ref 4.22–5.81)
RDW: 16 % — ABNORMAL HIGH (ref 11.5–15.5)
WBC Count: 4.1 10*3/uL (ref 4.0–10.5)
nRBC: 0 % (ref 0.0–0.2)

## 2023-01-21 MED ORDER — OXYCODONE HCL 5 MG PO TABS
5.0000 mg | ORAL_TABLET | Freq: Four times a day (QID) | ORAL | 0 refills | Status: DC | PRN
Start: 2023-01-21 — End: 2023-02-25

## 2023-01-21 NOTE — Progress Notes (Signed)
[facility]   [phone] Fax:([phone]) [phone]   Clinic Follow up Note   Patient Care Team: Sheliah Hatch, MD as PCP - General (Family Medicine) Jodelle Red, MD as PCP - Cardiology (Cardiology) Cindie Laroche, MD as Referring Physician (Urology) Felicita Gage, RN Nurse Navigator as Registered Nurse (Medical Oncology) Malachy Mood, MD as Consulting Physician (Hematology and Oncology)  Date of Service:  01/21/2023  CHIEF COMPLAINT: f/u of prostate cancer   CURRENT THERAPY:  Xofigo, under the care of Dr. Kathrynn Running  Eligard 22.5 mg every 3 months    ASSESSMENT:  Wayne Perkins is a 82 y.o. adult with   Prostate cancer (HCC) -Stage IV with note and bone metastasis, diagnosed in 2021, castration resistant -Initially presented with localized disease and Gleason score 6 in 2015.  He received radiation treatment in 2015. -He started androgen deprivation therapy for biochemical recurrent disease subsequently, with PSA decreased down to 0.4 in 2018.  He received a intermittent ADT due to side effects. -He developed bone and lymph node metastasis in 2021, started Xtandi 160 mg daily in June 2021.  Therapy stopped after 3 months due to poor tolerance. -He started Zytiga 500 mg daily with prednisone 5 mg daily in May 2022.  Therapy discontinued in June 2022 due to poor tolerance.  He is only on Eligard every 3 months now. -He started [place] on September 29, 2022, he tolerated first treatment very well.  Second treatment was held a few weeks ago due to his worsening anemia. -His anemia is likely related to his bone metastasis from prostate cancer.  Will give blood transfusion if Hg<8 and let him continue Xofigo, next dose scheduled for March 28th.  -We reviewed other treatment options for metastatic prostate cancer, patient is not interested in chemotherapy, but is open to other biological agent.  Will check his genetics, to see if he is a candidate for PARP  inhibitor down the road.  Malignant neoplasm of prostate metastatic to bone (HCC) -Continue calcium and vitamin D - Rivka Barbara has been deferred due to CKD   Anemia -Secondary to metastatic breast cancer to bone, Xofigo and anemia of chronic disease secondary to CKD -Ferritin was slightly elevated, folate and B12 level normal.  Low reticulocyte count, no evidence of nutritional anemia.  -anemia improved now      PLAN: -lab reviewed -PSA -pending - I refill Oxycodone -Continue Xofigo schedule for 5/28 -lab f/u and Eligard injection in 6/28   SUMMARY OF ONCOLOGIC HISTORY: Oncology History Overview Note   Cancer Staging  Malignant neoplasm of prostate metastatic to bone [facility]) Staging form: Prostate, AJCC 8th Edition - Clinical: Stage IVB (cTX, cNX, pM1b, Grade Group: 3) - Signed by Benjiman Core, MD on 02/17/2021 Gleason score: 7 Histologic grading system: 5 grade system     Malignant neoplasm of prostate metastatic to bone (HCC)  07/31/2019 Initial Diagnosis   Malignant neoplasm of prostate metastatic to bone (HCC)   02/17/2021 Cancer Staging   Staging form: Prostate, AJCC 8th Edition - Clinical: Stage IVB (cTX, cNX, pM1b, Grade Group: 3) - Signed by Benjiman Core, MD on 02/17/2021 Gleason score: 7 Histologic grading system: 5 grade system   Prostate cancer (HCC)  02/19/2020 Initial Diagnosis   Prostate cancer (HCC)      INTERVAL HISTORY:  Wayne Perkins is here for a follow up of prostate cancer. He was last seen by me on 11/05/2022. He presents to the clinic accompanied by wife. Pt state that his  energy level is still low. Pt feel that his pain is controlled with Tylenol and Oxycodone. Pt also reports of weakness in the calf muscle and he tries to stay active by walking.       All other systems were reviewed with the patient and are negative.  MEDICAL HISTORY:  Past Medical History:  Diagnosis Date   Arthritis    Blood in stool    Childhood asthma    Diabetes  mellitus without complication (HCC)    type II   Heart disease    Hypertension, essential    Prostate cancer (HCC)    Stroke [facility])     SURGICAL HISTORY: Past Surgical History:  Procedure Laterality Date   CORONARY ANGIOPLASTY WITH STENT PLACEMENT     GANGLION CYST EXCISION Right    PROSTATE BIOPSY      I have reviewed the social history and family history with the patient and they are unchanged from previous note.  ALLERGIES:  is allergic to tramadol, glipizide, and statins.  MEDICATIONS:  Current Outpatient Medications  Medication Sig Dispense Refill   Alcohol Swabs (B-D SINGLE USE SWABS REGULAR) PADS      Blood Glucose Calibration (ACCU-CHEK AVIVA) SOLN      blood glucose meter kit and supplies Use once a day in the AM for fasting sugars. 1 each 0   calcium-vitamin D (OSCAL WITH D) 500-200 MG-UNIT tablet Take 1 tablet by mouth 2 (two) times daily. 60 tablet 3   Cholecalciferol (VITAMIN D3) 50 MCG (2000 UT) TABS Take by mouth.     clopidogrel (PLAVIX) 75 MG tablet TAKE 1 TABLET EVERY DAY 90 tablet 3   Dulaglutide (TRULICITY) 0.75 MG/0.5ML SOPN Inject 0.75 mg into the skin once a week. 4 pen 0   Ez Smart Blood Glucose Lancets MISC by Does not apply route. 100 each 3   glucose blood test strip Use as instructed 100 each 3   hydrochlorothiazide (HYDRODIURIL) 25 MG tablet TAKE 1 TABLET EVERY DAY 90 tablet 3   hydrOXYzine (ATARAX) 10 MG tablet TAKE 1 TABLET BY MOUTH 3 TIMES DAILY AS NEEDED FOR ITCHING. 270 tablet 1   isosorbide mononitrate (IMDUR) 30 MG 24 hr tablet Take 1 tablet (30 mg total) by mouth daily. 90 tablet 3   Multiple Vitamin (MULTI-DAY VITAMINS PO) Take by mouth daily at 6 (six) AM.     NIFEdipine (PROCARDIA XL/NIFEDICAL-XL) 90 MG 24 hr tablet TAKE 1 TABLET EVERY DAY 90 tablet 3   nitroGLYCERIN (NITROSTAT) 0.4 MG SL tablet Place 1 tablet (0.4 mg total) under the tongue every 5 (five) minutes as needed for chest pain. 25 tablet 3   oxyCODONE (OXY IR/ROXICODONE) 5 MG  immediate release tablet Take 1 tablet (5 mg total) by mouth every 6 (six) hours as needed for severe pain. 45 tablet 0   polyethylene glycol (MIRALAX / GLYCOLAX) 17 g packet Take 17 g by mouth as needed for moderate constipation.     potassium chloride SA (KLOR-CON M) 20 MEQ tablet TAKE 1 TABLET TWICE DAILY 180 tablet 3   rosuvastatin (CRESTOR) 5 MG tablet TAKE 1 TABLET EVERY DAY 90 tablet 3   vitamin B-12 (CYANOCOBALAMIN) 1000 MCG tablet Take 1,000 mcg by mouth daily.     No current facility-administered medications for this visit.    PHYSICAL EXAMINATION: ECOG PERFORMANCE STATUS: 1 - Symptomatic but completely ambulatory  Vitals:   01/21/23 1125  BP: (!) 149/77  Pulse: 76  Resp: 18  Temp: 97.6 F (36.4 C)  SpO2: 99%   Wt Readings from Last 3 Encounters:  01/21/23 159 lb 14.4 oz (72.5 kg)  12/09/22 164 lb 6.4 oz (74.6 kg)  12/08/22 164 lb (74.4 kg)     GENERAL:alert, no distress and comfortable SKIN: skin color normal, no rashes or significant lesions EYES: normal, Conjunctiva are pink and non-injected, sclera clear  NEURO: alert & oriented x 3 with fluent speech    LABORATORY DATA:  I have reviewed the data as listed    Latest Ref Rng & Units 01/21/2023   10:42 AM 12/15/2022   11:56 AM 12/09/2022   10:34 AM  CBC  WBC 4.0 - 10.5 K/uL 4.1  3.6  2.9   Hemoglobin 13.0 - 17.0 g/dL 9.1  8.8  9.0   Hematocrit 39.0 - 52.0 % 27.7  26.9  27.1   Platelets 150 - 400 K/uL 218  204  268.0         Latest Ref Rng & Units 01/21/2023   10:42 AM 12/15/2022   11:56 AM 12/09/2022   10:34 AM  CMP  Glucose 70 - 99 mg/dL 161  096  045   BUN 8 - 23 mg/dL 36  32  34   Creatinine 0.61 - 1.24 mg/dL 4.09  8.11  9.14   Sodium 135 - 145 mmol/L 139  142  141   Potassium 3.5 - 5.1 mmol/L 3.8  4.1  4.6   Chloride 98 - 111 mmol/L 103  105  104   CO2 22 - 32 mmol/L 28  30  28    Calcium 8.9 - 10.3 mg/dL 9.6  9.5  9.8   Total Protein 6.5 - 8.1 g/dL 6.8  6.7  6.7   Total Bilirubin 0.3 - 1.2  mg/dL 0.4  0.3  0.4   Alkaline Phos 38 - 126 U/L 175  211  233   AST 15 - 41 U/L 24  23  22    ALT 0 - 44 U/L 12  10  10        RADIOGRAPHIC STUDIES: I have personally reviewed the radiological images as listed and agreed with the findings in the report. No results found.    No orders of the defined types were placed in this encounter.  All questions were answered. The patient knows to call the clinic with any problems, questions or concerns. No barriers to learning was detected. The total time spent in the appointment was 25 minutes.     Malachy Mood, MD 01/21/2023   Carolin Coy, CMA, am acting as scribe for Malachy Mood, MD.   I have reviewed the above documentation for accuracy and completeness, and I agree with the above.

## 2023-01-21 NOTE — Assessment & Plan Note (Signed)
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  He received radiation treatment in 2015. -He started androgen deprivation therapy for biochemical recurrent disease subsequently, with PSA decreased down to 0.4 in 2018.  He received a intermittent ADT due to side effects. -He developed bone and lymph node metastasis in 2021, started Xtandi 160 mg daily in June 2021.  Therapy stopped after 3 months due to poor tolerance. -He started Zytiga 500 mg daily with prednisone 5 mg daily in May 2022.  Therapy discontinued in June 2022 due to poor tolerance.  He is only on Eligard every 3 months now. -He started Xofigo on September 29, 2022, he tolerated first treatment very well.  Second treatment was held a few weeks ago due to his worsening anemia. -His anemia is likely related to his bone metastasis from prostate cancer.  Will give blood transfusion if Hg<8 and let him continue Xofigo, next dose scheduled for March 28th.  -We reviewed other treatment options for metastatic prostate cancer, patient is not interested in chemotherapy, but is open to other biological agent.  Will check his genetics, to see if he is a candidate for PARP inhibitor down the road. 

## 2023-01-21 NOTE — Assessment & Plan Note (Signed)
-  Secondary to metastatic breast cancer to bone, Xofigo and anemia of chronic disease secondary to CKD -Repeated labs showed hemoglobin 8.8, stable.  Ferritin was slightly elevated, folate and B12 level normal.  Low reticulocyte count, no evidence of nutritional anemia.

## 2023-01-21 NOTE — Assessment & Plan Note (Signed)
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  He received radiation treatment in 2015. -He started androgen deprivation therapy for biochemical recurrent disease subsequently, with PSA decreased down to 0.4 in 2018.  He received a intermittent ADT due to side effects. -He developed bone and lymph node metastasis in 2021, started Xtandi 160 mg daily in June 2021.  Therapy stopped after 3 months due to poor tolerance. -He started Zytiga 500 mg daily with prednisone 5 mg daily in May 2022.  Therapy discontinued in June 2022 due to poor tolerance.  He is only on Eligard every 3 months now. -He started [place] on September 29, 2022, he tolerated first treatment very well.  Second treatment was held a few weeks ago due to his worsening anemia. -His anemia is likely related to his bone metastasis from prostate cancer.  Will give blood transfusion if Hg<8 and let him continue Xofigo, next dose scheduled for March 28th.  -We reviewed other treatment options for metastatic prostate cancer, patient is not interested in chemotherapy, but is open to other biological agent.  Will check his genetics, to see if he is a candidate for PARP inhibitor down the road.

## 2023-01-22 LAB — PROSTATE-SPECIFIC AG, SERUM (LABCORP): Prostate Specific Ag, Serum: 583 ng/mL — ABNORMAL HIGH (ref 0.0–4.0)

## 2023-01-25 ENCOUNTER — Ambulatory Visit (HOSPITAL_BASED_OUTPATIENT_CLINIC_OR_DEPARTMENT_OTHER): Payer: Medicare HMO | Admitting: Cardiology

## 2023-01-26 ENCOUNTER — Telehealth: Payer: Self-pay | Admitting: Physician Assistant

## 2023-01-26 NOTE — Telephone Encounter (Signed)
Contacted patient to scheduled appointments. Patient is aware of appointments that are scheduled.   

## 2023-01-27 ENCOUNTER — Ambulatory Visit (HOSPITAL_COMMUNITY): Payer: Medicare HMO

## 2023-01-27 ENCOUNTER — Telehealth: Payer: Self-pay | Admitting: *Deleted

## 2023-01-27 NOTE — Telephone Encounter (Signed)
CALLED PATIENT TO REMIND OF XOFIGO INJ. FOR 01-28-23- ARRIVAL TIME- 11:45 AM @ [facility], SPOKE WITH PATIENT AND HE IS AWARE OF THIS INJ.

## 2023-01-28 ENCOUNTER — Ambulatory Visit (HOSPITAL_COMMUNITY)
Admission: RE | Admit: 2023-01-28 | Discharge: 2023-01-28 | Disposition: A | Discharge status: Home / Self Care | Payer: Medicare HMO | Source: Ambulatory Visit | Attending: Radiation Oncology | Admitting: Radiation Oncology

## 2023-01-28 DIAGNOSIS — Z191 Hormone sensitive malignancy status: Secondary | ICD-10-CM | POA: Diagnosis not present

## 2023-01-28 DIAGNOSIS — C7951 Secondary malignant neoplasm of bone: Secondary | ICD-10-CM | POA: Insufficient documentation

## 2023-01-28 DIAGNOSIS — C61 Malignant neoplasm of prostate: Secondary | ICD-10-CM | POA: Insufficient documentation

## 2023-01-28 MED ORDER — RADIUM RA 223 DICHLORIDE 30 MCCI/ML IV SOLN
109.6300 | Freq: Once | INTRAVENOUS | Status: AC | PRN
  Administered 2023-01-28: 109.63 via INTRAVENOUS

## 2023-02-03 ENCOUNTER — Other Ambulatory Visit: Payer: Self-pay

## 2023-02-03 DIAGNOSIS — C7951 Secondary malignant neoplasm of bone: Secondary | ICD-10-CM

## 2023-02-03 DIAGNOSIS — C61 Malignant neoplasm of prostate: Secondary | ICD-10-CM

## 2023-02-07 ENCOUNTER — Telehealth: Payer: Self-pay | Admitting: *Deleted

## 2023-02-07 NOTE — Progress Notes (Signed)
  Radiation Oncology         [phone] ________________________________  Name: Wayne Perkins MRN: [id number]  Date: 01/28/2023  DOB: September 05, 1940  Radium-223 Infusion Note  Diagnosis:  Castration resistant prostate cancer with painful bone involvement  Current Infusion:    5  Planned Infusions:  6  Narrative: Wayne Perkins presented to nuclear medicine for treatment. His most recent blood counts were reviewed.  He remains a good candidate to proceed with Ra-223.  The patient was situated in an infusion suite with a contact barrier placed under his arm. Intravenous access was established, using sterile technique, and a normal saline infusion from a syringe was started.  Micro-dosimetry:  The prescribed radiation activity was assayed and confirmed to be within specified tolerance.  Special Treatment Procedure - Infusion:  The nuclear medicine technologist and I personally verified the dose activity to be delivered as specified in the written directive, and verified the patient identification via 2 separate methods.  The syringe containing the dose was attached to an intravenous access and the dose delivered over a minute. No complications were noted.  The total administered dose was 110.5 microcuries.   A saline flush of the line and the syringe that contained the isotope was then performed.  The residual radioactivity in the syringe was 0.87 microcuries, so the actual infused isotope activity was 109.63 microcuries.   Pressure was applied to the venipuncture site, and a compression bandage placed.   Radiation Safety personnel were present to perform the discharge survey, as detailed on their documentation.   After a short period of observation, the patient had his IV removed.  Impression:  The patient tolerated his infusion relatively well.  Plan:  The patient will return in one month for ongoing care.    ________________________________  Artist Pais. Kathrynn Running, M.D.

## 2023-02-07 NOTE — Telephone Encounter (Signed)
Called patient to inform that lab and weight will be done on 02-09-23 @ 12 pm @ [facility] and his Xofigo Inj. on 02-23-23- arrival time- 1:45 pm @ [facility], spoke with patient and he is aware of these appts.

## 2023-02-08 ENCOUNTER — Telehealth: Payer: Self-pay | Admitting: *Deleted

## 2023-02-08 NOTE — Telephone Encounter (Signed)
Called patient to remind of lab and weight for 02-09-23 @ 12 pm @ [facility], lvm for a return call

## 2023-02-09 ENCOUNTER — Other Ambulatory Visit: Payer: Self-pay

## 2023-02-09 ENCOUNTER — Ambulatory Visit
Admission: RE | Admit: 2023-02-09 | Discharge: 2023-02-09 | Disposition: A | Discharge status: Home / Self Care | Payer: Medicare HMO | Source: Ambulatory Visit | Attending: Hematology | Admitting: Hematology

## 2023-02-09 DIAGNOSIS — C61 Malignant neoplasm of prostate: Secondary | ICD-10-CM | POA: Insufficient documentation

## 2023-02-09 DIAGNOSIS — C7951 Secondary malignant neoplasm of bone: Secondary | ICD-10-CM | POA: Insufficient documentation

## 2023-02-09 LAB — CBC WITH DIFFERENTIAL (CANCER CENTER ONLY)
Abs Immature Granulocytes: 0.01 10*3/uL (ref 0.00–0.07)
Basophils Absolute: 0 10*3/uL (ref 0.0–0.1)
Basophils Relative: 1 %
Eosinophils Absolute: 0.2 10*3/uL (ref 0.0–0.5)
Eosinophils Relative: 6 %
HCT: 26.9 % — ABNORMAL LOW (ref 39.0–52.0)
Hemoglobin: 8.8 g/dL — ABNORMAL LOW (ref 13.0–17.0)
Immature Granulocytes: 0 %
Lymphocytes Relative: 28 %
Lymphs Abs: 0.9 10*3/uL (ref 0.7–4.0)
MCH: 32.2 pg (ref 26.0–34.0)
MCHC: 32.7 g/dL (ref 30.0–36.0)
MCV: 98.5 fL (ref 80.0–100.0)
Monocytes Absolute: 0.6 10*3/uL (ref 0.1–1.0)
Monocytes Relative: 19 %
Neutro Abs: 1.5 10*3/uL — ABNORMAL LOW (ref 1.7–7.7)
Neutrophils Relative %: 46 %
Platelet Count: 206 10*3/uL (ref 150–400)
RBC: 2.73 MIL/uL — ABNORMAL LOW (ref 4.22–5.81)
RDW: 16.2 % — ABNORMAL HIGH (ref 11.5–15.5)
WBC Count: 3.3 10*3/uL — ABNORMAL LOW (ref 4.0–10.5)
nRBC: 0 % (ref 0.0–0.2)

## 2023-02-14 ENCOUNTER — Inpatient Hospital Stay: Payer: Medicare HMO

## 2023-02-14 ENCOUNTER — Ambulatory Visit: Payer: Medicare HMO | Admitting: Hematology

## 2023-02-17 ENCOUNTER — Ambulatory Visit: Payer: Medicare HMO

## 2023-02-22 ENCOUNTER — Telehealth: Payer: Self-pay | Admitting: *Deleted

## 2023-02-22 NOTE — Telephone Encounter (Signed)
CALLED PATIENT TO REMIND OF XOFIGO INJ. FOR 02-23-23 - ARRIVAL TIME- 1:45 PM @ [facility], LVM FOR A RETURN CALL

## 2023-02-23 ENCOUNTER — Ambulatory Visit (HOSPITAL_COMMUNITY)
Admission: RE | Admit: 2023-02-23 | Discharge: 2023-02-23 | Disposition: A | Discharge status: Home / Self Care | Payer: Medicare HMO | Source: Ambulatory Visit | Attending: Radiation Oncology | Admitting: Radiation Oncology

## 2023-02-23 DIAGNOSIS — C61 Malignant neoplasm of prostate: Secondary | ICD-10-CM | POA: Diagnosis not present

## 2023-02-23 DIAGNOSIS — C7951 Secondary malignant neoplasm of bone: Secondary | ICD-10-CM | POA: Insufficient documentation

## 2023-02-23 DIAGNOSIS — Z191 Hormone sensitive malignancy status: Secondary | ICD-10-CM | POA: Diagnosis not present

## 2023-02-23 MED ORDER — RADIUM RA 223 DICHLORIDE 30 MCCI/ML IV SOLN
107.9700 | Freq: Once | INTRAVENOUS | Status: AC | PRN
  Administered 2023-02-23: 107.97 via INTRAVENOUS

## 2023-02-24 ENCOUNTER — Ambulatory Visit (HOSPITAL_COMMUNITY): Payer: Medicare HMO

## 2023-02-24 ENCOUNTER — Other Ambulatory Visit: Payer: Self-pay | Admitting: Physician Assistant

## 2023-02-24 DIAGNOSIS — C61 Malignant neoplasm of prostate: Secondary | ICD-10-CM

## 2023-02-25 ENCOUNTER — Telehealth: Payer: Self-pay | Admitting: Hematology

## 2023-02-25 ENCOUNTER — Other Ambulatory Visit: Payer: Self-pay

## 2023-02-25 ENCOUNTER — Inpatient Hospital Stay: Payer: Medicare HMO

## 2023-02-25 ENCOUNTER — Inpatient Hospital Stay (HOSPITAL_BASED_OUTPATIENT_CLINIC_OR_DEPARTMENT_OTHER): Payer: Medicare HMO | Admitting: Physician Assistant

## 2023-02-25 ENCOUNTER — Inpatient Hospital Stay: Payer: Medicare HMO | Attending: Oncology

## 2023-02-25 VITALS — BP 144/79 | HR 79 | Temp 98.2°F | Resp 15 | Ht 70.0 in | Wt 159.5 lb

## 2023-02-25 DIAGNOSIS — C61 Malignant neoplasm of prostate: Secondary | ICD-10-CM

## 2023-02-25 DIAGNOSIS — D631 Anemia in chronic kidney disease: Secondary | ICD-10-CM | POA: Diagnosis not present

## 2023-02-25 DIAGNOSIS — Z79899 Other long term (current) drug therapy: Secondary | ICD-10-CM | POA: Diagnosis not present

## 2023-02-25 DIAGNOSIS — D630 Anemia in neoplastic disease: Secondary | ICD-10-CM | POA: Diagnosis not present

## 2023-02-25 DIAGNOSIS — N189 Chronic kidney disease, unspecified: Secondary | ICD-10-CM | POA: Insufficient documentation

## 2023-02-25 DIAGNOSIS — C7951 Secondary malignant neoplasm of bone: Secondary | ICD-10-CM

## 2023-02-25 DIAGNOSIS — R5383 Other fatigue: Secondary | ICD-10-CM | POA: Insufficient documentation

## 2023-02-25 DIAGNOSIS — Z923 Personal history of irradiation: Secondary | ICD-10-CM | POA: Insufficient documentation

## 2023-02-25 LAB — CMP (CANCER CENTER ONLY)
ALT: 11 U/L (ref 0–44)
AST: 25 U/L (ref 15–41)
Albumin: 4.1 g/dL (ref 3.5–5.0)
Alkaline Phosphatase: 137 U/L — ABNORMAL HIGH (ref 38–126)
Anion gap: 8 (ref 5–15)
BUN: 32 mg/dL — ABNORMAL HIGH (ref 8–23)
CO2: 30 mmol/L (ref 22–32)
Calcium: 9.6 mg/dL (ref 8.9–10.3)
Chloride: 103 mmol/L (ref 98–111)
Creatinine: 1.29 mg/dL — ABNORMAL HIGH (ref 0.61–1.24)
GFR, Estimated: 55 mL/min — ABNORMAL LOW (ref >60)
Glucose, Bld: 117 mg/dL — ABNORMAL HIGH (ref 70–99)
Potassium: 4.3 mmol/L (ref 3.5–5.1)
Sodium: 141 mmol/L (ref 135–145)
Total Bilirubin: 0.3 mg/dL (ref 0.3–1.2)
Total Protein: 6.9 g/dL (ref 6.5–8.1)

## 2023-02-25 LAB — CBC WITH DIFFERENTIAL (CANCER CENTER ONLY)
Abs Immature Granulocytes: 0.03 10*3/uL (ref 0.00–0.07)
Basophils Absolute: 0 10*3/uL (ref 0.0–0.1)
Basophils Relative: 1 %
Eosinophils Absolute: 0.2 10*3/uL (ref 0.0–0.5)
Eosinophils Relative: 5 %
HCT: 27.6 % — ABNORMAL LOW (ref 39.0–52.0)
Hemoglobin: 8.9 g/dL — ABNORMAL LOW (ref 13.0–17.0)
Immature Granulocytes: 1 %
Lymphocytes Relative: 23 %
Lymphs Abs: 0.8 10*3/uL (ref 0.7–4.0)
MCH: 32.1 pg (ref 26.0–34.0)
MCHC: 32.2 g/dL (ref 30.0–36.0)
MCV: 99.6 fL (ref 80.0–100.0)
Monocytes Absolute: 0.6 10*3/uL (ref 0.1–1.0)
Monocytes Relative: 17 %
Neutro Abs: 1.8 10*3/uL (ref 1.7–7.7)
Neutrophils Relative %: 53 %
Platelet Count: 208 10*3/uL (ref 150–400)
RBC: 2.77 MIL/uL — ABNORMAL LOW (ref 4.22–5.81)
RDW: 16 % — ABNORMAL HIGH (ref 11.5–15.5)
WBC Count: 3.3 10*3/uL — ABNORMAL LOW (ref 4.0–10.5)
nRBC: 0 % (ref 0.0–0.2)

## 2023-02-25 MED ORDER — LEUPROLIDE ACETATE (3 MONTH) 22.5 MG ~~LOC~~ KIT
22.5000 mg | PACK | Freq: Once | SUBCUTANEOUS | Status: AC
  Administered 2023-02-25: 22.5 mg via SUBCUTANEOUS
  Filled 2023-02-25: qty 22.5

## 2023-02-25 MED ORDER — OXYCODONE HCL 5 MG PO TABS
5.0000 mg | ORAL_TABLET | Freq: Four times a day (QID) | ORAL | 0 refills | Status: DC | PRN
Start: 2023-03-11 — End: 2023-05-24

## 2023-02-25 NOTE — Telephone Encounter (Signed)
Left a message regarding next appointment times/dates

## 2023-02-25 NOTE — Progress Notes (Signed)
[facility]   [phone] [phone]   Clinic Follow up Note   Patient Care Team: Sheliah Hatch, MD as PCP - General (Family Medicine) Jodelle Red, MD as PCP - Cardiology (Cardiology) Cindie Laroche, MD as Referring Physician (Urology) Felicita Gage, RN Nurse Navigator as Registered Nurse (Medical Oncology) Malachy Mood, MD as Consulting Physician (Hematology and Oncology)  Date of Service:  02/25/2023  CHIEF COMPLAINT: f/u of prostate cancer   CURRENT THERAPY:  Xofigo, under the care of Dr. Kathrynn Running  Eligard 22.5 mg every 3 months   SUMMARY OF ONCOLOGIC HISTORY: Oncology History Overview Note   Cancer Staging  Malignant neoplasm of prostate metastatic to bone [facility]) Staging form: Prostate, AJCC 8th Edition - Clinical: Stage IVB (cTX, cNX, pM1b, Grade Group: 3) - Signed by Benjiman Core, MD on 02/17/2021 Gleason score: 7 Histologic grading system: 5 grade system     Malignant neoplasm of prostate metastatic to bone (HCC)  07/31/2019 Initial Diagnosis   Malignant neoplasm of prostate metastatic to bone (HCC)   02/17/2021 Cancer Staging   Staging form: Prostate, AJCC 8th Edition - Clinical: Stage IVB (cTX, cNX, pM1b, Grade Group: 3) - Signed by Benjiman Core, MD on 02/17/2021 Gleason score: 7 Histologic grading system: 5 grade system   Prostate cancer (HCC)  02/19/2020 Initial Diagnosis   Prostate cancer (HCC)      INTERVAL HISTORY:  Wayne Perkins is here for a follow up of prostate cancer. He was last seen by Dr. [name] on 01/20/2022. In the interim, he continues with Xofigo infusions with Dr. Kathrynn Running. He completed his 5th out of 6th infusion on 01/27/2022. He presents to the clinic accompanied by wife.   Wayne Perkins reports he is tolerating treatment well without any new or concerning symptoms. He still has fatigue which is stable. He reports his bone pain is improved and well controlled with tylenol and oxycodone as needed.  He is trying to stay active by walking regularly. He denies any nausea, vomiting or abdominal pain. His bowel habits are unchanged without recurrent episodes of diarrhea or constipation. He denies easy bruising or signs of active bleeding. He denies fevers, chills, sweats, shortness of breath, chest pain or cough. He has no other complaints.   All other systems were reviewed with the patient and are negative.  MEDICAL HISTORY:  Past Medical History:  Diagnosis Date   Arthritis    Blood in stool    Childhood asthma    Diabetes mellitus without complication (HCC)    type II   Heart disease    Hypertension, essential    Prostate cancer (HCC)    Stroke [facility])     SURGICAL HISTORY: Past Surgical History:  Procedure Laterality Date   CORONARY ANGIOPLASTY WITH STENT PLACEMENT     GANGLION CYST EXCISION Right    PROSTATE BIOPSY      I have reviewed the social history and family history with the patient and they are unchanged from previous note.  ALLERGIES:  is allergic to tramadol, glipizide, and statins.  MEDICATIONS:  Current Outpatient Medications  Medication Sig Dispense Refill   Alcohol Swabs (B-D SINGLE USE SWABS REGULAR) PADS      Blood Glucose Calibration (ACCU-CHEK AVIVA) SOLN      blood glucose meter kit and supplies Use once a day in the AM for fasting sugars. 1 each 0   calcium-vitamin D (OSCAL WITH D) 500-200 MG-UNIT tablet Take 1 tablet by mouth 2 (two) times daily.  60 tablet 3   Cholecalciferol (VITAMIN D3) 50 MCG (2000 UT) TABS Take by mouth.     clopidogrel (PLAVIX) 75 MG tablet TAKE 1 TABLET EVERY DAY 90 tablet 3   Dulaglutide (TRULICITY) 0.75 MG/0.5ML SOPN Inject 0.75 mg into the skin once a week. 4 pen 0   Ez Smart Blood Glucose Lancets MISC by Does not apply route. 100 each 3   glucose blood test strip Use as instructed 100 each 3   hydrochlorothiazide (HYDRODIURIL) 25 MG tablet TAKE 1 TABLET EVERY DAY 90 tablet 3   hydrOXYzine (ATARAX) 10 MG tablet TAKE 1  TABLET BY MOUTH 3 TIMES DAILY AS NEEDED FOR ITCHING. 270 tablet 1   isosorbide mononitrate (IMDUR) 30 MG 24 hr tablet Take 1 tablet (30 mg total) by mouth daily. 90 tablet 3   Multiple Vitamin (MULTI-DAY VITAMINS PO) Take by mouth daily at 6 (six) AM.     NIFEdipine (PROCARDIA XL/NIFEDICAL-XL) 90 MG 24 hr tablet TAKE 1 TABLET EVERY DAY 90 tablet 3   oxyCODONE (OXY IR/ROXICODONE) 5 MG immediate release tablet Take 1 tablet (5 mg total) by mouth every 6 (six) hours as needed for severe pain. 45 tablet 0   polyethylene glycol (MIRALAX / GLYCOLAX) 17 g packet Take 17 g by mouth as needed for moderate constipation.     potassium chloride SA (KLOR-CON M) 20 MEQ tablet TAKE 1 TABLET TWICE DAILY 180 tablet 3   rosuvastatin (CRESTOR) 5 MG tablet TAKE 1 TABLET EVERY DAY 90 tablet 3   vitamin B-12 (CYANOCOBALAMIN) 1000 MCG tablet Take 1,000 mcg by mouth daily.     nitroGLYCERIN (NITROSTAT) 0.4 MG SL tablet Place 1 tablet (0.4 mg total) under the tongue every 5 (five) minutes as needed for chest pain. 25 tablet 3   No current facility-administered medications for this visit.   Facility-Administered Medications Ordered in Other Visits  Medication Dose Route Frequency Provider Last Rate Last Admin   Leuprolide Acetate (3 Month) (ELIGARD) 22.5 MG injection 22.5 mg  22.5 mg Subcutaneous Once Malachy Mood, MD        PHYSICAL EXAMINATION: ECOG PERFORMANCE STATUS: 1 - Symptomatic but completely ambulatory  Vitals:   02/25/23 1054  BP: (!) 144/79  Pulse: 79  Resp: 15  Temp: 98.2 F (36.8 C)  SpO2: 98%   Wt Readings from Last 3 Encounters:  02/25/23 159 lb 8 oz (72.3 kg)  01/21/23 159 lb 14.4 oz (72.5 kg)  12/09/22 164 lb 6.4 oz (74.6 kg)     Constitutional: Oriented to person, place, and time HENT:  Head: Normocephalic and atraumatic.  Eyes: Conjunctivae are normal. Right eye exhibits no discharge. Left eye exhibits no discharge. No scleral icterus.  Neck: Normal range of motion. Neck supple.    Cardiovascular: Normal rate, regular rhythm, normal heart sounds and intact distal pulses.   Pulmonary/Chest: Effort normal and breath sounds normal. No respiratory distress. No wheezes. No rales.  Musculoskeletal: Normal range of motion. Exhibits no edema.  Neurological: Alert and oriented to person, place, and time. Exhibits normal muscle tone. Gait normal. Coordination normal.  Skin: Skin is warm and dry. No rash noted. Not diaphoretic. No erythema. No pallor.  Psychiatric: Mood, memory and judgment normal.      LABORATORY DATA:  I have reviewed the data as listed    Latest Ref Rng & Units 02/25/2023   10:33 AM 02/09/2023   12:29 PM 01/21/2023   10:42 AM  CBC  WBC 4.0 - 10.5 K/uL 3.3  3.3  4.1   Hemoglobin 13.0 - 17.0 g/dL 8.9  8.8  9.1   Hematocrit 39.0 - 52.0 % 27.6  26.9  27.7   Platelets 150 - 400 K/uL 208  206  218         Latest Ref Rng & Units 02/25/2023   10:33 AM 01/21/2023   10:42 AM 12/15/2022   11:56 AM  CMP  Glucose 70 - 99 mg/dL 413  244  010   BUN 8 - 23 mg/dL 32  36  32   Creatinine 0.61 - 1.24 mg/dL 2.72  5.36  6.44   Sodium 135 - 145 mmol/L 141  139  142   Potassium 3.5 - 5.1 mmol/L 4.3  3.8  4.1   Chloride 98 - 111 mmol/L 103  103  105   CO2 22 - 32 mmol/L 30  28  30    Calcium 8.9 - 10.3 mg/dL 9.6  9.6  9.5   Total Protein 6.5 - 8.1 g/dL 6.9  6.8  6.7   Total Bilirubin 0.3 - 1.2 mg/dL 0.3  0.4  0.3   Alkaline Phos 38 - 126 U/L 137  175  211   AST 15 - 41 U/L 25  24  23    ALT 0 - 44 U/L 11  12  10        RADIOGRAPHIC STUDIES: I have personally reviewed the radiological images as listed and agreed with the findings in the report. NM XOFIGO INJECTION  Result Date: 02/23/2023  Standley Dakins was injected intravenously in Nuclear Medicine under the supervision of the attending radiologist       ASSESSMENT:  Wayne Perkins is a 82 y.o. adult with   Prostate cancer (HCC) -Stage IV with note and bone metastasis, diagnosed in 2021, castration  resistant -Initially presented with localized disease and Gleason score 6 in 2015.  He received radiation treatment in 2015. -He started androgen deprivation therapy for biochemical recurrent disease subsequently, with PSA decreased down to 0.4 in 2018.  He received a intermittent ADT due to side effects. -He developed bone and lymph node metastasis in 2021, started Xtandi 160 mg daily in June 2021.  Therapy stopped after 3 months due to poor tolerance. -He started Zytiga 500 mg daily with prednisone 5 mg daily in May 2022.  Therapy discontinued in June 2022 due to poor tolerance.  He is only on Eligard every 3 months now. -He started [place] on September 29, 2022. -We reviewed other treatment options for metastatic prostate cancer, patient is not interested in chemotherapy, but is open to other biological agent.  Will check his genetics, to see if he is a candidate for PARP inhibitor down the road.  Malignant neoplasm of prostate metastatic to bone (HCC) -Continue calcium and vitamin D - Rivka Barbara has been deferred due to CKD   Anemia -Secondary to metastatic breast cancer to bone, Xofigo and anemia of chronic disease secondary to CKD -Ferritin was slightly elevated, folate and B12 level normal.  Low reticulocyte count, no evidence of nutritional anemia.   PLAN: -Received 5th cycle of Xofigo on 01/28/2023. Due for 6th dose in approximately 4 weeks, waiting to be scheduled with Dr. Kathrynn Running.  -Labs today reviewed and require no intervention. WBC 3.3, Hgb stable at 8.9, Plt 208, Creatinine overall stable at 1.29. LFTs normal. PSA level pending. -Proceed with Eligard injection today. -Patient has approximately 2 weeks left of oxycodone, will send refill today with planned fill in 2 weeks.  -RTC in 3 months with labs,  follow up and Eligard injection.   No orders of the defined types were placed in this encounter.  All questions were answered. The patient knows to call the clinic with any problems,  questions or concerns. No barriers to learning was detected.  I have spent a total of 25 minutes minutes of face-to-face and non-face-to-face time, preparing to see the patient, performing a medically appropriate examination, counseling and educating the patient, ordering medications, documenting clinical information in the electronic health record,and care coordination.   Georga Kaufmann PA-C Dept of Hematology and Oncology [facility] Phone: [phone]

## 2023-02-25 NOTE — Patient Instructions (Signed)
Leuprolide Suspension for Injection (Prostate Cancer) What is this medication? LEUPROLIDE (loo PROE lide) reduces the symptoms of prostate cancer. It works by decreasing levels of the hormone testosterone in the body. This prevents prostate cancer cells from spreading or growing. This medicine may be used for other purposes; ask your health care provider or pharmacist if you have questions. COMMON BRAND NAME(S): Eligard, Lupron Depot, Lupron Depot-Ped, Lutrate Depot, Viadur What should I tell my care team before I take this medication? They need to know if you have any of these conditions: Diabetes Heart disease Heart failure High or low levels of electrolytes, such as magnesium, potassium, or sodium in your blood Irregular heartbeat or rhythm Seizures An unusual or allergic reaction to leuprolide, other medications, foods, dyes, or preservatives Pregnant or trying to get pregnant Breast-feeding How should I use this medication? This medication is injected under the skin or into a muscle. It is given by your care team in a hospital or clinic setting. Talk to your care team about the use of this medication in children. Special care may be needed. Overdosage: If you think you have taken too much of this medicine contact a poison control center or emergency room at once. NOTE: This medicine is only for you. Do not share this medicine with others. What if I miss a dose? Keep appointments for follow-up doses. It is important not to miss your dose. Call your care team if you are unable to keep an appointment. What may interact with this medication? Do not take this medication with any of the following: Cisapride Dronedarone Ketoconazole Levoketoconazole Pimozide Thioridazine This medication may also interact with the following: Other medications that cause heart rhythm changes This list may not describe all possible interactions. Give your health care provider a list of all the medicines,  herbs, non-prescription drugs, or dietary supplements you use. Also tell them if you smoke, drink alcohol, or use illegal drugs. Some items may interact with your medicine. What should I watch for while using this medication? Visit your care team for regular checks on your progress. Tell your care team if your symptoms do not start to get better or if they get worse. This medication may increase blood sugar. The risk may be higher in patients who already have diabetes. Ask your care team what you can do to lower the risk of diabetes while taking this medication. This medication may cause infertility. Talk to your care team if you are concerned about your fertility. Heart attacks and strokes have been reported with the use of this medication. Get emergency help if you develop signs or symptoms of a heart attack or stroke. Talk to your care team about the risks and benefits of this medication. What side effects may I notice from receiving this medication? Side effects that you should report to your care team as soon as possible: Allergic reactions--skin rash, itching, hives, swelling of the face, lips, tongue, or throat Heart attack--pain or tightness in the chest, shoulders, arms, or jaw, nausea, shortness of breath, cold or clammy skin, feeling faint or lightheaded Heart rhythm changes--fast or irregular heartbeat, dizziness, feeling faint or lightheaded, chest pain, trouble breathing High blood sugar (hyperglycemia)--increased thirst or amount of urine, unusual weakness or fatigue, blurry vision Mood swings, irritability, hostility Seizures Stroke--sudden numbness or weakness of the face, arm, or leg, trouble speaking, confusion, trouble walking, loss of balance or coordination, dizziness, severe headache, change in vision Thoughts of suicide or self-harm, worsening mood, feelings of depression Side   effects that usually do not require medical attention (report to your care team if they continue or  are bothersome): Bone pain Change in sex drive or performance General discomfort and fatigue Hot flashes Muscle pain Pain, redness, or irritation at injection site Swelling of the ankles, hands, or feet This list may not describe all possible side effects. Call your doctor for medical advice about side effects. You may report side effects to FDA at 1-800-FDA-1088. Where should I keep my medication? This medication is given in a hospital or clinic. It will not be stored at home. NOTE: This sheet is a summary. It may not cover all possible information. If you have questions about this medicine, talk to your doctor, pharmacist, or health care provider.  2023 Elsevier/Gold Standard (2021-10-26 00:00:00)  

## 2023-02-26 LAB — PROSTATE-SPECIFIC AG, SERUM (LABCORP): Prostate Specific Ag, Serum: 624 ng/mL — ABNORMAL HIGH (ref 0.0–4.0)

## 2023-03-01 ENCOUNTER — Telehealth (HOSPITAL_COMMUNITY): Payer: Self-pay | Admitting: *Deleted

## 2023-03-01 NOTE — Telephone Encounter (Signed)
Patient given detailed instructions per Myocardial Perfusion Study Information Sheet for the test on 03/09/23 Patient notified to arrive 15 minutes early and that it is imperative to arrive on time for appointment to keep from having the test rescheduled.  If you need to cancel or reschedule your appointment, please call the office within 24 hours of your appointment. . Patient verbalized understanding. Ricky Ala

## 2023-03-06 NOTE — Progress Notes (Signed)
  Radiation Oncology         [phone] ________________________________  Name: Gus Dicks MRN: [id number]  Date: 02/23/2023  DOB: 05/17/1941  Radium-223 Infusion Note  Diagnosis:  Castration resistant prostate cancer with painful bone involvement  Current Infusion:    6  Planned Infusions:  6  Narrative: Mr. Nekia Navejas presented to nuclear medicine for treatment. His most recent blood counts were reviewed.  He remains a good candidate to proceed with Ra-223.  The patient was situated in an infusion suite with a contact barrier placed under his arm. Intravenous access was established, using sterile technique, and a normal saline infusion from a syringe was started.  Micro-dosimetry:  The prescribed radiation activity was assayed and confirmed to be within specified tolerance.  Special Treatment Procedure - Infusion:  The nuclear medicine technologist and I personally verified the dose activity to be delivered as specified in the written directive, and verified the patient identification via 2 separate methods.  The syringe containing the dose was attached to an intravenous access and the dose delivered over a minute. No complications were noted.  The total administered dose was 111.3 microcuries.   A saline flush of the line and the syringe that contained the isotope was then performed.  The residual radioactivity in the syringe was 3.3 microcuries, so the actual infused isotope activity was 108 microcuries.   Pressure was applied to the venipuncture site, and a compression bandage placed.   Radiation Safety personnel were present to perform the discharge survey, as detailed on their documentation.   After a short period of observation, the patient had his IV removed.  Impression:  The patient tolerated his infusion relatively well.  Plan:  The patient will return in one month for ongoing care.    ________________________________  Artist Pais. Kathrynn Running, M.D.

## 2023-03-07 ENCOUNTER — Telehealth: Payer: Self-pay | Admitting: *Deleted

## 2023-03-07 NOTE — Telephone Encounter (Signed)
Called patient to ask about doing last lab, weight and final injection, patient states that he will not do these unless they pay for the first injection that he had, notified Dr. Kathrynn Running to call this patient

## 2023-03-08 ENCOUNTER — Encounter: Payer: Self-pay | Admitting: Cardiology

## 2023-03-08 ENCOUNTER — Telehealth: Payer: Self-pay | Admitting: Cardiology

## 2023-03-08 NOTE — Telephone Encounter (Signed)
Left message to call back  

## 2023-03-08 NOTE — Telephone Encounter (Signed)
Patient called in with his wife on the line stating yesterday and today he has been weak and fatigued. He mentions weakness in his legs and just very low energy overall. Patient states he has not had any CP, just weakness. He cancelled 7/10 stress test because he does not think he will be up for it. Patient would like to know if Dr. Cristal Deer would suggest lab work or any other recommendations. Please advise.

## 2023-03-08 NOTE — Telephone Encounter (Signed)
Error

## 2023-03-09 ENCOUNTER — Ambulatory Visit (HOSPITAL_COMMUNITY): Payer: Medicare HMO | Attending: Cardiology

## 2023-03-10 ENCOUNTER — Telehealth: Payer: Self-pay

## 2023-03-10 NOTE — Telephone Encounter (Signed)
2nd call attempt, no answer, left message to call back  

## 2023-03-10 NOTE — Telephone Encounter (Signed)
Mr. Judice call to inform staff that this morning he was experiencing some dizziness and lack of energy after his last Xofigo treatment 02/23/2023.  Mr. Varelas reports took BP at home results 108/58.  He stated he missed a call from Dr. Kathrynn Running and wanted to speak to him.  RN informed  Mr. Tozzi that Dr. Kathrynn Running was out of the office and his concerns will be forwarded to Marcello Fennel, PA-C.  RN advised patient to increase his water intake since he reports only drinking 2 (16 oz) bottle of water and some Gatorade.  Denies dry month, nausea, changes in urination output, and cramping. Mr. Sheahan was appreciative of staff talking to him about concerns.

## 2023-03-10 NOTE — Telephone Encounter (Signed)
[name] called earlier with complaints of feeling dizzy and lack of energy due to abnormal lab results and thought he needed blood transfusion.  RN informed [name] that he didn't need a blood transfusion at this time.  RN called patient to find out if he wanted to continue with the last Xofigo injection.  [name] responded that at this time he prefers to wait for Dr. Kathrynn Running to return to speak with him before proceeding with the last Xofigo injection.  Will inform Ashlyn Bruning, PA-C of decision.

## 2023-03-11 NOTE — Telephone Encounter (Signed)
3rd call attempt, no answer, left message to call back. Removing from triage pool at this time.

## 2023-03-17 ENCOUNTER — Ambulatory Visit (INDEPENDENT_AMBULATORY_CARE_PROVIDER_SITE_OTHER): Payer: Medicare HMO | Admitting: Family Medicine

## 2023-03-17 ENCOUNTER — Encounter: Payer: Self-pay | Admitting: Family Medicine

## 2023-03-17 ENCOUNTER — Telehealth: Payer: Self-pay

## 2023-03-17 VITALS — BP 116/78 | HR 70 | Temp 98.4°F | Resp 17 | Ht 70.0 in | Wt 159.1 lb

## 2023-03-17 DIAGNOSIS — E1121 Type 2 diabetes mellitus with diabetic nephropathy: Secondary | ICD-10-CM | POA: Diagnosis not present

## 2023-03-17 DIAGNOSIS — R5383 Other fatigue: Secondary | ICD-10-CM

## 2023-03-17 LAB — CBC WITH DIFFERENTIAL/PLATELET
Basophils Absolute: 0 10*3/uL (ref 0.0–0.1)
Basophils Relative: 0.8 % (ref 0.0–3.0)
Eosinophils Absolute: 0.1 10*3/uL (ref 0.0–0.7)
Eosinophils Relative: 4.8 % (ref 0.0–5.0)
HCT: 25.1 % — ABNORMAL LOW (ref 39.0–52.0)
Hemoglobin: 8 g/dL — CL (ref 13.0–17.0)
Lymphocytes Relative: 20.3 % (ref 12.0–46.0)
Lymphs Abs: 0.5 10*3/uL — ABNORMAL LOW (ref 0.7–4.0)
MCHC: 31.9 g/dL (ref 30.0–36.0)
MCV: 97.9 fl (ref 78.0–100.0)
Monocytes Absolute: 0.4 10*3/uL (ref 0.1–1.0)
Monocytes Relative: 14.2 % — ABNORMAL HIGH (ref 3.0–12.0)
Neutro Abs: 1.6 10*3/uL (ref 1.4–7.7)
Neutrophils Relative %: 59.9 % (ref 43.0–77.0)
Platelets: 262 10*3/uL (ref 150.0–400.0)
RBC: 2.57 Mil/uL — ABNORMAL LOW (ref 4.22–5.81)
RDW: 17.2 % — ABNORMAL HIGH (ref 11.5–15.5)
WBC: 2.7 10*3/uL — ABNORMAL LOW (ref 4.0–10.5)

## 2023-03-17 LAB — BASIC METABOLIC PANEL
BUN: 34 mg/dL — ABNORMAL HIGH (ref 6–23)
CO2: 28 mEq/L (ref 19–32)
Calcium: 9.1 mg/dL (ref 8.4–10.5)
Chloride: 102 mEq/L (ref 96–112)
Creatinine, Ser: 1.44 mg/dL (ref 0.40–1.50)
GFR: 45.21 mL/min — ABNORMAL LOW (ref >60.00)
Glucose, Bld: 186 mg/dL — ABNORMAL HIGH (ref 70–99)
Potassium: 3.6 mEq/L (ref 3.5–5.1)
Sodium: 140 mEq/L (ref 135–145)

## 2023-03-17 LAB — HEPATIC FUNCTION PANEL
ALT: 11 U/L (ref 0–53)
AST: 22 U/L (ref 0–37)
Albumin: 4 g/dL (ref 3.5–5.2)
Alkaline Phosphatase: 96 U/L (ref 39–117)
Bilirubin, Direct: 0.1 mg/dL (ref 0.0–0.3)
Total Bilirubin: 0.3 mg/dL (ref 0.2–1.2)
Total Protein: 6.5 g/dL (ref 6.0–8.3)

## 2023-03-17 LAB — TSH: TSH: 0.8 u[IU]/mL (ref 0.35–5.50)

## 2023-03-17 LAB — HEMOGLOBIN A1C: Hgb A1c MFr Bld: 7.6 % — ABNORMAL HIGH (ref 4.6–6.5)

## 2023-03-17 MED ORDER — HYDROCHLOROTHIAZIDE 25 MG PO TABS: 25.0000 mg | ORAL_TABLET | Freq: Every day | ORAL | 3 refills | Status: DC

## 2023-03-17 MED ORDER — NIFEDIPINE ER OSMOTIC RELEASE 90 MG PO TB24: 90.0000 mg | ORAL_TABLET | Freq: Every day | ORAL | 3 refills | Status: DC

## 2023-03-17 MED ORDER — HYDROXYZINE HCL 10 MG PO TABS: ORAL_TABLET | ORAL | 1 refills | Status: DC

## 2023-03-17 MED ORDER — POTASSIUM CHLORIDE CRYS ER 20 MEQ PO TBCR: 20.0000 meq | EXTENDED_RELEASE_TABLET | Freq: Two times a day (BID) | ORAL | 3 refills | Status: DC

## 2023-03-17 NOTE — Telephone Encounter (Signed)
Wayne Perkins from [facility] called states pt has a critical HGB of 8

## 2023-03-17 NOTE — Progress Notes (Signed)
   Subjective:    Patient ID: Wayne Perkins, adult    DOB: 25-Feb-1941, 82 y.o.   MRN: [id number]  HPI Fatigue- pt reports legs feel weak when walking.  Denies SOB.  Pt had last infusion on 6/28 and this is when fatigue and weakness started.  Hgb at that time 8.9.  Getting Xofigo radium Ra 223 infusions w/ Dr Kathrynn Running and Leuprolide injections every 3 months.  Wife reports that pt had similar fatigue during his last 6 weeks of radiation.   Review of Systems For ROS see HPI     Objective:   Physical Exam Vitals reviewed.  Constitutional:      General: He is not in acute distress.    Appearance: He is well-developed. He is not ill-appearing.  HENT:     Head: Normocephalic and atraumatic.  Eyes:     Extraocular Movements: Extraocular movements intact.     Conjunctiva/sclera: Conjunctivae normal.     Pupils: Pupils are equal, round, and reactive to light.  Neck:     Thyroid: No thyromegaly.  Cardiovascular:     Rate and Rhythm: Normal rate and regular rhythm.     Pulses: Normal pulses.     Heart sounds: Normal heart sounds. No murmur heard. Pulmonary:     Effort: Pulmonary effort is normal. No respiratory distress.     Breath sounds: Normal breath sounds.  Abdominal:     General: Bowel sounds are normal. There is no distension.     Palpations: Abdomen is soft.  Musculoskeletal:     Cervical back: Normal range of motion and neck supple.     Right lower leg: No edema.     Left lower leg: No edema.  Lymphadenopathy:     Cervical: No cervical adenopathy.  Skin:    General: Skin is warm and dry.     Coloration: Skin is pale.  Neurological:     General: No focal deficit present.     Mental Status: He is alert and oriented to person, place, and time.     Cranial Nerves: No cranial nerve deficit.  Psychiatric:        Mood and Affect: Mood normal.        Behavior: Behavior normal.           Assessment & Plan:  Fatigue- new.  Pt reports sxs started on 6/28 after his last  infusion.  Reports legs feel weak.  Wife reports he had similar fatigue while undergoing radiation tx previously.  Last Hgb was 8.9  Concern that this has dropped even lower.  Check labs to look for underlying cause of fatigue and tx prn.  Pt expressed understanding and is in agreement w/ plan.

## 2023-03-17 NOTE — Patient Instructions (Addendum)
Follow up as needed or as scheduled We'll notify you of your lab results and make any changes if needed Make sure you are drinking plenty of fluids and eating regularly for energy Call with any questions or concerns Hang in there!!!

## 2023-03-18 ENCOUNTER — Other Ambulatory Visit: Payer: Self-pay

## 2023-03-18 ENCOUNTER — Inpatient Hospital Stay: Payer: Medicare HMO | Attending: Oncology

## 2023-03-18 ENCOUNTER — Telehealth: Payer: Self-pay

## 2023-03-18 DIAGNOSIS — N189 Chronic kidney disease, unspecified: Secondary | ICD-10-CM | POA: Insufficient documentation

## 2023-03-18 DIAGNOSIS — R5383 Other fatigue: Secondary | ICD-10-CM | POA: Diagnosis not present

## 2023-03-18 DIAGNOSIS — C61 Malignant neoplasm of prostate: Secondary | ICD-10-CM | POA: Insufficient documentation

## 2023-03-18 DIAGNOSIS — D631 Anemia in chronic kidney disease: Secondary | ICD-10-CM | POA: Insufficient documentation

## 2023-03-18 DIAGNOSIS — Z923 Personal history of irradiation: Secondary | ICD-10-CM | POA: Diagnosis not present

## 2023-03-18 DIAGNOSIS — D649 Anemia, unspecified: Secondary | ICD-10-CM

## 2023-03-18 DIAGNOSIS — C7951 Secondary malignant neoplasm of bone: Secondary | ICD-10-CM | POA: Insufficient documentation

## 2023-03-18 DIAGNOSIS — D630 Anemia in neoplastic disease: Secondary | ICD-10-CM | POA: Diagnosis not present

## 2023-03-18 DIAGNOSIS — Z79899 Other long term (current) drug therapy: Secondary | ICD-10-CM | POA: Diagnosis not present

## 2023-03-18 LAB — CBC WITH DIFFERENTIAL (CANCER CENTER ONLY)
Abs Immature Granulocytes: 0.03 10*3/uL (ref 0.00–0.07)
Basophils Absolute: 0 10*3/uL (ref 0.0–0.1)
Basophils Relative: 1 %
Eosinophils Absolute: 0.1 10*3/uL (ref 0.0–0.5)
Eosinophils Relative: 4 %
HCT: 24.7 % — ABNORMAL LOW (ref 39.0–52.0)
Hemoglobin: 8.1 g/dL — ABNORMAL LOW (ref 13.0–17.0)
Immature Granulocytes: 1 %
Lymphocytes Relative: 18 %
Lymphs Abs: 0.5 10*3/uL — ABNORMAL LOW (ref 0.7–4.0)
MCH: 32 pg (ref 26.0–34.0)
MCHC: 32.8 g/dL (ref 30.0–36.0)
MCV: 97.6 fL (ref 80.0–100.0)
Monocytes Absolute: 0.5 10*3/uL (ref 0.1–1.0)
Monocytes Relative: 17 %
Neutro Abs: 1.8 10*3/uL (ref 1.7–7.7)
Neutrophils Relative %: 59 %
Platelet Count: 225 10*3/uL (ref 150–400)
RBC: 2.53 MIL/uL — ABNORMAL LOW (ref 4.22–5.81)
RDW: 16.1 % — ABNORMAL HIGH (ref 11.5–15.5)
WBC Count: 3.1 10*3/uL — ABNORMAL LOW (ref 4.0–10.5)
nRBC: 0 % (ref 0.0–0.2)

## 2023-03-18 LAB — CMP (CANCER CENTER ONLY)
ALT: 10 U/L (ref 0–44)
AST: 21 U/L (ref 15–41)
Albumin: 4 g/dL (ref 3.5–5.0)
Alkaline Phosphatase: 104 U/L (ref 38–126)
Anion gap: 11 (ref 5–15)
BUN: 33 mg/dL — ABNORMAL HIGH (ref 8–23)
CO2: 27 mmol/L (ref 22–32)
Calcium: 9 mg/dL (ref 8.9–10.3)
Chloride: 104 mmol/L (ref 98–111)
Creatinine: 1.47 mg/dL — ABNORMAL HIGH (ref 0.61–1.24)
GFR, Estimated: 47 mL/min — ABNORMAL LOW (ref >60)
Glucose, Bld: 142 mg/dL — ABNORMAL HIGH (ref 70–99)
Potassium: 3.2 mmol/L — ABNORMAL LOW (ref 3.5–5.1)
Sodium: 142 mmol/L (ref 135–145)
Total Bilirubin: 0.3 mg/dL (ref 0.3–1.2)
Total Protein: 6.5 g/dL (ref 6.5–8.1)

## 2023-03-18 LAB — TYPE AND SCREEN

## 2023-03-18 LAB — PREPARE RBC (CROSSMATCH)

## 2023-03-18 NOTE — Telephone Encounter (Signed)
I have informed pt of lab results

## 2023-03-18 NOTE — Telephone Encounter (Signed)
Pt is aware of result

## 2023-03-18 NOTE — Progress Notes (Signed)
Verbal order given to administer 1 unit of PRBC from Dr. [name], spoke with [place] in blood bank type and screen order received , patient will have 1 unit of PrBC transfused on 03/19/2023

## 2023-03-18 NOTE — Telephone Encounter (Signed)
-----   Message from Neena Rhymes sent at 03/18/2023  7:48 AM EDT ----- WBC (white blood cells), RBC (red blood cells), and hemoglobin are all lower than they were 3 weeks ago.  I suspect your low blood count (anemia) is the cause of your worsening fatigue.  I am routing this message to Dr [name] as you likely need a transfusion to help w/ energy/fatigue.  You can also call their office and let them know.  Remainder of labs are stable at this time.

## 2023-03-18 NOTE — Telephone Encounter (Signed)
Addressed via result note 

## 2023-03-19 ENCOUNTER — Inpatient Hospital Stay: Payer: Medicare HMO

## 2023-03-19 ENCOUNTER — Other Ambulatory Visit: Payer: Self-pay

## 2023-03-19 DIAGNOSIS — D631 Anemia in chronic kidney disease: Secondary | ICD-10-CM | POA: Diagnosis not present

## 2023-03-19 DIAGNOSIS — Z923 Personal history of irradiation: Secondary | ICD-10-CM | POA: Diagnosis not present

## 2023-03-19 DIAGNOSIS — N189 Chronic kidney disease, unspecified: Secondary | ICD-10-CM | POA: Diagnosis not present

## 2023-03-19 DIAGNOSIS — C7951 Secondary malignant neoplasm of bone: Secondary | ICD-10-CM | POA: Diagnosis not present

## 2023-03-19 DIAGNOSIS — C61 Malignant neoplasm of prostate: Secondary | ICD-10-CM | POA: Diagnosis not present

## 2023-03-19 DIAGNOSIS — D630 Anemia in neoplastic disease: Secondary | ICD-10-CM | POA: Diagnosis not present

## 2023-03-19 DIAGNOSIS — Z79899 Other long term (current) drug therapy: Secondary | ICD-10-CM | POA: Diagnosis not present

## 2023-03-19 DIAGNOSIS — R5383 Other fatigue: Secondary | ICD-10-CM | POA: Diagnosis not present

## 2023-03-19 DIAGNOSIS — D649 Anemia, unspecified: Secondary | ICD-10-CM

## 2023-03-19 LAB — PROSTATE-SPECIFIC AG, SERUM (LABCORP): Prostate Specific Ag, Serum: 645 ng/mL — ABNORMAL HIGH (ref 0.0–4.0)

## 2023-03-19 LAB — BPAM RBC

## 2023-03-19 MED ORDER — SODIUM CHLORIDE 0.9% IV SOLUTION: 250.0000 mL | Freq: Once | INTRAVENOUS | Status: DC

## 2023-03-19 NOTE — Patient Instructions (Signed)

## 2023-03-21 ENCOUNTER — Telehealth (HOSPITAL_BASED_OUTPATIENT_CLINIC_OR_DEPARTMENT_OTHER): Payer: Self-pay | Admitting: Cardiology

## 2023-03-21 ENCOUNTER — Telehealth: Payer: Self-pay

## 2023-03-21 LAB — BPAM RBC
Blood Product Expiration Date: 202408092359
ISSUE DATE / TIME: 202407201027
Unit Type and Rh: 9500

## 2023-03-21 LAB — TYPE AND SCREEN
ABO/RH(D): A NEG
Antibody Screen: NEGATIVE
Unit division: 0

## 2023-03-21 NOTE — Telephone Encounter (Signed)
Patient stated he had a unit of blood at the [facility] on Saturday and wants to know if he should reschedule his MYOCARDIAL PERFUSION IMAGING test.

## 2023-03-21 NOTE — Telephone Encounter (Addendum)
Made the lab appointment as per Dr. [name] patient had an infusion on Saturday 03/19/23 as per Dr. [name].    ----- Message from Malachy Mood sent at 03/20/2023  4:58 PM EDT ----- Yes, we have arranged blood transfusion for him, thanks   My nurse, please add a lab appointment in 4 weeks for monitoring, thanks  Malachy Mood ----- Message ----- From: Sheliah Hatch, MD Sent: 03/18/2023   7:48 AM EDT To: Malachy Mood, MD; [facility] Clinical Pool  WBC (white blood cells), RBC (red blood cells), and hemoglobin are all lower than they were 3 weeks ago.  I suspect your low blood count (anemia) is the cause of your worsening fatigue.  I am routing this message to Dr [name] as you likely need a transfusion to help w/ energy/fatigue.  You can also call their office and let them know.  Remainder of labs are stable at this time.

## 2023-03-21 NOTE — Telephone Encounter (Signed)
Returned call to patient, no answer, left detailed message (ok per DPR)

## 2023-03-22 ENCOUNTER — Telehealth: Payer: Self-pay | Admitting: Radiation Oncology

## 2023-03-22 NOTE — Telephone Encounter (Signed)
  Radiation Oncology         [phone] ________________________________  Name: Wayne Perkins MRN: [id number]  Date: 03/22/2023  DOB: 1941/07/28  Telephone contact:  I received called this patient and discussed his anemia and Xofigo therapy.  He completed 5 cycles of Xofigo.  He is eligible for one more round.  Unfortunately, his Hgb has been less than 9 even requiring transfusion.  His anemia is a contraindication to further Xofigo, and we discussed that.  He was comfortable foregoing any further infusions.  ________________________________  Artist Pais Kathrynn Running, M.D.

## 2023-03-28 ENCOUNTER — Ambulatory Visit (HOSPITAL_BASED_OUTPATIENT_CLINIC_OR_DEPARTMENT_OTHER): Payer: Medicare HMO | Admitting: Cardiology

## 2023-03-28 ENCOUNTER — Telehealth: Payer: Self-pay | Admitting: Family Medicine

## 2023-03-28 NOTE — Telephone Encounter (Signed)
Caller name: Taurus Majkut  On DPR?: Yes  Call back number: [phone] (home)  Provider they see: Sheliah Hatch, MD  Reason for call:  Pt states after last blood lab (got a pint of blood) he had energy but now he's back to not feeling good - advise

## 2023-03-28 NOTE — Telephone Encounter (Signed)
He should reach out to his oncologist to see how they want to proceed as they are the ones that arrange his transfusions.  They may want to do more lab work

## 2023-03-28 NOTE — Telephone Encounter (Signed)
I have informed the pt to reach out to the Oncologist

## 2023-03-28 NOTE — Telephone Encounter (Signed)
Sent to Dr.Taboi Please advise.

## 2023-03-30 ENCOUNTER — Telehealth: Payer: Self-pay

## 2023-03-30 ENCOUNTER — Other Ambulatory Visit: Payer: Self-pay

## 2023-03-30 DIAGNOSIS — D649 Anemia, unspecified: Secondary | ICD-10-CM

## 2023-03-30 DIAGNOSIS — C61 Malignant neoplasm of prostate: Secondary | ICD-10-CM

## 2023-03-30 NOTE — Telephone Encounter (Signed)
Patient called in stating he received blood on 07/20 after calling in with lab orders from his PCP with his WBC, RBC and Hgb were all lower than they were 3 weeks ago. He stated after he got blood he felt great but now he is back to feeling the way he did before he received blood. Patient wanted to know if he could come in for labs and be checked again. I spoke wth Dr. [name] she stated to have him come in for labs tomorrow to see if he needs blood again. I called patient back and he stated he could come in at 10 am. Dr. [name] stated to have a CBC, CMP and type and cross. Orders were placed.

## 2023-03-31 ENCOUNTER — Other Ambulatory Visit: Payer: Self-pay

## 2023-03-31 ENCOUNTER — Inpatient Hospital Stay: Payer: Medicare HMO | Attending: Oncology

## 2023-03-31 ENCOUNTER — Telehealth: Payer: Self-pay | Admitting: Hematology

## 2023-03-31 ENCOUNTER — Telehealth: Payer: Self-pay

## 2023-03-31 DIAGNOSIS — C7951 Secondary malignant neoplasm of bone: Secondary | ICD-10-CM | POA: Insufficient documentation

## 2023-03-31 DIAGNOSIS — C61 Malignant neoplasm of prostate: Secondary | ICD-10-CM | POA: Insufficient documentation

## 2023-03-31 DIAGNOSIS — D630 Anemia in neoplastic disease: Secondary | ICD-10-CM | POA: Diagnosis not present

## 2023-03-31 DIAGNOSIS — Z79899 Other long term (current) drug therapy: Secondary | ICD-10-CM | POA: Diagnosis not present

## 2023-03-31 DIAGNOSIS — Z923 Personal history of irradiation: Secondary | ICD-10-CM | POA: Diagnosis not present

## 2023-03-31 DIAGNOSIS — D649 Anemia, unspecified: Secondary | ICD-10-CM

## 2023-03-31 LAB — CBC WITH DIFFERENTIAL (CANCER CENTER ONLY)
Abs Immature Granulocytes: 0.04 10*3/uL (ref 0.00–0.07)
Basophils Absolute: 0 10*3/uL (ref 0.0–0.1)
Basophils Relative: 1 %
Eosinophils Absolute: 0.1 10*3/uL (ref 0.0–0.5)
Eosinophils Relative: 4 %
HCT: 25.2 % — ABNORMAL LOW (ref 39.0–52.0)
Hemoglobin: 8.4 g/dL — ABNORMAL LOW (ref 13.0–17.0)
Immature Granulocytes: 1 %
Lymphocytes Relative: 19 %
Lymphs Abs: 0.7 10*3/uL (ref 0.7–4.0)
MCH: 31.3 pg (ref 26.0–34.0)
MCHC: 33.3 g/dL (ref 30.0–36.0)
MCV: 94 fL (ref 80.0–100.0)
Monocytes Absolute: 0.6 10*3/uL (ref 0.1–1.0)
Monocytes Relative: 15 %
Neutro Abs: 2.2 10*3/uL (ref 1.7–7.7)
Neutrophils Relative %: 60 %
Platelet Count: 186 10*3/uL (ref 150–400)
RBC: 2.68 MIL/uL — ABNORMAL LOW (ref 4.22–5.81)
RDW: 17.5 % — ABNORMAL HIGH (ref 11.5–15.5)
WBC Count: 3.7 10*3/uL — ABNORMAL LOW (ref 4.0–10.5)
nRBC: 0 % (ref 0.0–0.2)

## 2023-03-31 LAB — TYPE AND SCREEN
ABO/RH(D): A NEG
Antibody Screen: NEGATIVE

## 2023-03-31 NOTE — Telephone Encounter (Signed)
Critical Lab Value reported:  Hbg 8.4 today Notified Dr. [name], Santiago Glad, NP, and Vincent Gros, NP.    Blood Bank hold for T&S is available.

## 2023-04-01 ENCOUNTER — Inpatient Hospital Stay: Payer: Medicare HMO

## 2023-04-01 ENCOUNTER — Other Ambulatory Visit: Payer: Self-pay

## 2023-04-01 DIAGNOSIS — C61 Malignant neoplasm of prostate: Secondary | ICD-10-CM

## 2023-04-01 DIAGNOSIS — Z923 Personal history of irradiation: Secondary | ICD-10-CM | POA: Diagnosis not present

## 2023-04-01 DIAGNOSIS — D630 Anemia in neoplastic disease: Secondary | ICD-10-CM | POA: Diagnosis not present

## 2023-04-01 DIAGNOSIS — C7951 Secondary malignant neoplasm of bone: Secondary | ICD-10-CM | POA: Diagnosis not present

## 2023-04-01 DIAGNOSIS — D649 Anemia, unspecified: Secondary | ICD-10-CM

## 2023-04-01 DIAGNOSIS — Z79899 Other long term (current) drug therapy: Secondary | ICD-10-CM | POA: Diagnosis not present

## 2023-04-01 MED ORDER — SODIUM CHLORIDE 0.9% IV SOLUTION
250.0000 mL | Freq: Once | INTRAVENOUS | Status: AC
  Administered 2023-04-01: 250 mL via INTRAVENOUS

## 2023-04-01 NOTE — Patient Instructions (Signed)

## 2023-04-05 ENCOUNTER — Emergency Department (HOSPITAL_COMMUNITY)
Admission: EM | Admit: 2023-04-05 | Discharge: 2023-04-05 | Discharge status: Against Medical Advice | Payer: Medicare HMO | Source: Home / Self Care

## 2023-04-05 DIAGNOSIS — I10 Essential (primary) hypertension: Secondary | ICD-10-CM | POA: Diagnosis not present

## 2023-04-05 DIAGNOSIS — R339 Retention of urine, unspecified: Secondary | ICD-10-CM | POA: Diagnosis not present

## 2023-04-07 ENCOUNTER — Ambulatory Visit (INDEPENDENT_AMBULATORY_CARE_PROVIDER_SITE_OTHER): Payer: Medicare HMO | Admitting: Family Medicine

## 2023-04-07 ENCOUNTER — Encounter: Payer: Self-pay | Admitting: Family Medicine

## 2023-04-07 VITALS — BP 110/64 | HR 82 | Temp 97.8°F | Resp 18 | Ht 70.0 in | Wt 158.0 lb

## 2023-04-07 DIAGNOSIS — R52 Pain, unspecified: Secondary | ICD-10-CM

## 2023-04-07 MED ORDER — PREDNISONE 10 MG PO TABS: ORAL_TABLET | ORAL | 0 refills | Status: DC

## 2023-04-07 NOTE — Progress Notes (Signed)
   Subjective:    Patient ID: Wayne Perkins, adult    DOB: 02/03/1941, 82 y.o.   MRN: [id number]  HPI Pain- pt reports pain in neck, back, shoulders.  Pt reports he has never experienced pain like this before.  'it just immobilizes me'.  Sxs started weeks ago and have been worsening.  No specific injury.  Pain will travel from one part of the body to another- not consistently in 1 place.  Most frequently in back and neck.  Today states it is most painful when he has to use his arms to push himself out of a chair.  Taking tylenol w/ some relief and topical roll on.  Takes Oxycodone prior to bed.  Pt has known diffuse bony metastases.   Review of Systems For ROS see HPI     Objective:   Physical Exam Vitals reviewed.  Constitutional:      General: He is not in acute distress.    Appearance: Normal appearance. He is not ill-appearing.  HENT:     Head: Normocephalic and atraumatic.  Musculoskeletal:        General: No swelling, tenderness (no TTP over neck, upper back, shoulders/upper arms bilaterally) or deformity.     Cervical back: Normal range of motion. No rigidity or tenderness.  Neurological:     Mental Status: He is alert and oriented to person, place, and time.  Psychiatric:        Mood and Affect: Mood normal.        Behavior: Behavior normal.           Assessment & Plan:   Pain- new.  Pt is having a difficult time describing pain or localizing it.  It seems to be primarily in neck and shoulders but not always.  States pain doesn't necessarily radiate from a certain location but rather migrates from place to place.  Differential dx include cervical radiculopathy, trap spasms (although no TTP), metastatic bone pain, arthritis.  Will start Prednisone taper to improve inflammation.  He is to continue tylenol and can increase his frequency of oxycodone as needed.  Encouraged him to discuss sxs w/ oncology as this may be a side effect of his xofigo or he may need additional pain  control for possibly worsening mets.

## 2023-04-07 NOTE — Patient Instructions (Signed)
Follow up as needed or as scheduled START the Prednisone as directed- 3 pills at the same time x3 days, then 2 pills at the same time x3 days, then 1 pill daily.  Take w/ food  CONTINUE the Tylenol for additional pain relief USE the Oxycodone as needed Use heat or ice to help w/ pain relief If symptoms aren't improving or worsening, please let me know Call with any questions or concerns Stay Safe!  Stay Dry!

## 2023-04-11 ENCOUNTER — Other Ambulatory Visit: Payer: Medicare HMO

## 2023-04-12 ENCOUNTER — Telehealth: Payer: Self-pay | Admitting: Family Medicine

## 2023-04-12 NOTE — Telephone Encounter (Signed)
Caller name: Mearle Schweder  On DPR?: Yes  Call back number: [phone] (home)  Provider they see: Sheliah Hatch, MD  Reason for call: Pt would like a call to discuss a medication.

## 2023-04-12 NOTE — Telephone Encounter (Signed)
Pt was given the Medrol taper pack last week . He states the first doe he felt good after that he started feeling worse with the neck pain . He states it works better on 3 tablets  He is wanting to know can he take this daily or is there anything he can take daily for the neck pain ? Not sleeping well .

## 2023-04-12 NOTE — Telephone Encounter (Signed)
I have informed the pt of Dr Beverely Low message that he can take 3 tablets until he runs out but he needs to reach out to his oncologist . Pt expressed verbal understanding

## 2023-04-12 NOTE — Telephone Encounter (Signed)
He can take 3 tabs daily until he runs out.  I worry that ongoing use of prednisone will have side effects.  I encourage him to reach out to Oncology as the pain he is having may be related to his bone mets and they may need to adjust his treatment

## 2023-04-13 ENCOUNTER — Other Ambulatory Visit: Payer: Self-pay

## 2023-04-13 ENCOUNTER — Telehealth: Payer: Self-pay

## 2023-04-13 DIAGNOSIS — C7951 Secondary malignant neoplasm of bone: Secondary | ICD-10-CM

## 2023-04-13 NOTE — Telephone Encounter (Signed)
Spoke w/pt via telephone regarding pt's c/o neck pain.  Pt stated he's having 10/10 pain in his neck, down his shoulders to his lower back.  Pt stated he contacted his PCP regarding the pt and was prescribed Prednisone 10mg  w/a daily titrate.  Pt stated when he titrated to 2 pill/day the pain was more intense.  Pt stated when he was on the full dose of Prednisone the pain was managed/resolved.  Pt stated he has taken Acetaminophen w/no pain relief.  Pt takes the Oxycodone only at bedtime to manage his pain throughout the night.  Pt stated he's unable to take the Oxycodone during the day d/t pt drive and have thing to do during the day.  Pt stated he has a hx of "Stiff Neck".  Pt stated the only thing he's found to give him pain relief is ice packs.  Pt rated pain at 2/10 after applying ice packs to the area.  Informed pt that this nurse will notify Dr. [name] of the pt's pain.

## 2023-04-13 NOTE — Telephone Encounter (Signed)
Transition Care Management Unsuccessful Follow-up Telephone Call  Date of discharge and from where:  [facility] 8/6  Attempts:  1st Attempt  Reason for unsuccessful TCM follow-up call:  No answer/busy   Lenard Forth [facility] Guide, [facility] [phone] [address] [place], [place], [place] [address] Phone: [phone] Email: [email or web address]

## 2023-04-13 NOTE — Telephone Encounter (Signed)
Transition Care Management Unsuccessful Follow-up Telephone Call  Date of discharge and from where:  [facility]  Attempts:  2nd Attempt  Reason for unsuccessful TCM follow-up call:  No answer/busy   Lenard Forth [facility] Guide, [facility] [phone] [address] [place], [place], [place] [address] Phone: [phone] Email: [email or web address]

## 2023-04-14 ENCOUNTER — Inpatient Hospital Stay (HOSPITAL_BASED_OUTPATIENT_CLINIC_OR_DEPARTMENT_OTHER): Payer: Medicare HMO | Admitting: Hematology

## 2023-04-14 ENCOUNTER — Other Ambulatory Visit (HOSPITAL_COMMUNITY): Payer: Self-pay

## 2023-04-14 ENCOUNTER — Encounter: Payer: Self-pay | Admitting: Hematology

## 2023-04-14 ENCOUNTER — Other Ambulatory Visit: Payer: Self-pay

## 2023-04-14 VITALS — BP 154/75 | HR 93 | Temp 98.2°F | Resp 18 | Ht 70.0 in | Wt 154.7 lb

## 2023-04-14 DIAGNOSIS — C61 Malignant neoplasm of prostate: Secondary | ICD-10-CM

## 2023-04-14 DIAGNOSIS — C7951 Secondary malignant neoplasm of bone: Secondary | ICD-10-CM

## 2023-04-14 DIAGNOSIS — Z923 Personal history of irradiation: Secondary | ICD-10-CM | POA: Diagnosis not present

## 2023-04-14 DIAGNOSIS — Z79899 Other long term (current) drug therapy: Secondary | ICD-10-CM | POA: Diagnosis not present

## 2023-04-14 DIAGNOSIS — M542 Cervicalgia: Secondary | ICD-10-CM

## 2023-04-14 DIAGNOSIS — D630 Anemia in neoplastic disease: Secondary | ICD-10-CM | POA: Diagnosis not present

## 2023-04-14 MED ORDER — PREDNISONE 10 MG PO TABS
10.0000 mg | ORAL_TABLET | Freq: Every day | ORAL | 0 refills | Status: DC
  Filled 2023-04-14: qty 12, 12d supply, fill #0

## 2023-04-14 MED ORDER — DULOXETINE HCL 30 MG PO CPEP
30.0000 mg | ORAL_CAPSULE | Freq: Every day | ORAL | 0 refills | Status: DC
  Filled 2023-04-14: qty 30, 30d supply, fill #0

## 2023-04-14 NOTE — Assessment & Plan Note (Signed)
-  pt previously had work related neck muscular pain -last PET in 07/2022 also showed diffuse bone metastasis in cervical spine -Due to his significant worsening neck pain, I will obtain cervical spine MRI as soon as possible, to see if he needs palliative radiation

## 2023-04-14 NOTE — Assessment & Plan Note (Signed)
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  He received radiation treatment in 2015. -He started androgen deprivation therapy for biochemical recurrent disease subsequently, with PSA decreased down to 0.4 in 2018.  He received a intermittent ADT due to side effects. -He developed bone and lymph node metastasis in 2021, started Xtandi 160 mg daily in June 2021.  Therapy stopped after 3 months due to poor tolerance. -He started Zytiga 500 mg daily with prednisone 5 mg daily in May 2022.  Therapy discontinued in June 2022 due to poor tolerance.  He is only on Eligard every 3 months now. -He started [place] on September 29, 2022, he tolerated first treatment very well.  Second treatment was held a few weeks ago due to his worsening anemia. -His anemia is likely related to his bone metastasis from prostate cancer.  Will give blood transfusion if Hg<8 and let him continue Xofigo, which he completed on 02/23/23 -We reviewed other treatment options for metastatic prostate cancer, patient is not interested in chemotherapy, but is open to other biological agent.  Will check his genetics and Guardant 360, to see if he is a candidate for PARP inhibitor down the road.

## 2023-04-14 NOTE — Progress Notes (Signed)
[facility]   [phone] Fax:([phone]   Clinic Follow up Note   Patient Care Team: Sheliah Hatch, MD as PCP - General (Family Medicine) Jodelle Red, MD as PCP - Cardiology (Cardiology) Cindie Laroche, MD as Referring Physician (Urology) Felicita Gage, RN Nurse Navigator as Registered Nurse (Medical Oncology) Malachy Mood, MD as Consulting Physician (Hematology and Oncology)  Date of Service:  04/14/2023  CHIEF COMPLAINT: f/u of prostate cancer   CURRENT THERAPY:  Xofigo, under the care of Dr. Kathrynn Running  Eligard 22.5 mg every 3 months  ASSESSMENT:  Wayne Perkins is a 82 y.o. 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  He received radiation treatment in 2015. -He started androgen deprivation therapy for biochemical recurrent disease subsequently, with PSA decreased down to 0.4 in 2018.  He received a intermittent ADT due to side effects. -He developed bone and lymph node metastasis in 2021, started Xtandi 160 mg daily in June 2021.  Therapy stopped after 3 months due to poor tolerance. -He started Zytiga 500 mg daily with prednisone 5 mg daily in May 2022.  Therapy discontinued in June 2022 due to poor tolerance.  He is only on Eligard every 3 months now. -He started [place] on September 29, 2022, he tolerated first treatment very well.  Second treatment was held a few weeks ago due to his worsening anemia. -His anemia is likely related to his bone metastasis from prostate cancer.  Will give blood transfusion if Hg<8 and let him continue Xofigo, which he completed on 02/23/23 -We reviewed other treatment options for metastatic prostate cancer, patient is not interested in chemotherapy, but is open to other biological agent.  Will check his genetics and Guardant 360, to  see if he is a candidate for PARP inhibitor down the road.  Neck pain -pt previously had work related neck muscular pain -last PET in 07/2022 also showed diffuse bone metastasis in cervical spine -Due to his significant worsening neck pain, I will obtain cervical spine MRI as soon as possible, to see if he needs palliative radiation    PLAN: - I prescribe Prednisone 30mg  dailyX5 and Cymbalta due to his severe neck pain -cervical Spine MR schedule tomorrow 8/16 - I will reach to Dr. Kathrynn Running for possible radiation   SUMMARY OF ONCOLOGIC HISTORY: Oncology History Overview Note   Cancer Staging  Malignant neoplasm of prostate metastatic to bone [facility]) Staging form: Prostate, AJCC 8th Edition - Clinical: Stage IVB (cTX, cNX, pM1b, Grade Group: 3) - Signed by Benjiman Core, MD on 02/17/2021 Gleason score: 7 Histologic grading system: 5 grade system     Malignant neoplasm of prostate metastatic to bone (HCC)  07/31/2019 Initial Diagnosis   Malignant neoplasm of prostate metastatic to bone (HCC)   02/17/2021 Cancer Staging   Staging form: Prostate, AJCC 8th Edition - Clinical: Stage IVB (cTX, cNX, pM1b, Grade Group: 3) - Signed by Benjiman Core, MD on 02/17/2021 Gleason score: 7 Histologic grading system: 5 grade system   Prostate cancer (HCC)  02/19/2020 Initial Diagnosis   Prostate cancer (HCC)      INTERVAL HISTORY:  Wayne Perkins is here for a follow up of prostate cancer. He was last seen by Rutha Bouchard on 02/25/2023. He presents to the clinic accompanied by wife. Pt reports of have severe neck  pain for about 4 weeks.He states that he can move his neck to the left. He states he has pain in both arms when he tries to move a sitting position to standing. He denies having numbness and tingling. His vision is normal. He state after he had the blood transfusion he felt better.       All other systems were reviewed with the patient and are negative.  MEDICAL HISTORY:   Past Medical History:  Diagnosis Date   Arthritis    Blood in stool    Childhood asthma    Diabetes mellitus without complication (HCC)    type II   Heart disease    Hypertension, essential    Prostate cancer (HCC)    Stroke [facility])     SURGICAL HISTORY: Past Surgical History:  Procedure Laterality Date   CORONARY ANGIOPLASTY WITH STENT PLACEMENT     GANGLION CYST EXCISION Right    PROSTATE BIOPSY      I have reviewed the social history and family history with the patient and they are unchanged from previous note.  ALLERGIES:  is allergic to tramadol, glipizide, and statins.  MEDICATIONS:  Current Outpatient Medications  Medication Sig Dispense Refill   Alcohol Swabs (B-D SINGLE USE SWABS REGULAR) PADS      Blood Glucose Calibration (ACCU-CHEK AVIVA) SOLN      blood glucose meter kit and supplies Use once a day in the AM for fasting sugars. 1 each 0   calcium-vitamin D (OSCAL WITH D) 500-200 MG-UNIT tablet Take 1 tablet by mouth 2 (two) times daily. 60 tablet 3   Cholecalciferol (VITAMIN D3) 50 MCG (2000 UT) TABS Take by mouth.     clopidogrel (PLAVIX) 75 MG tablet TAKE 1 TABLET EVERY DAY 90 tablet 3   Dulaglutide (TRULICITY) 0.75 MG/0.5ML SOPN Inject 0.75 mg into the skin once a week. 4 pen 0   Ez Smart Blood Glucose Lancets MISC by Does not apply route. 100 each 3   glucose blood test strip Use as instructed 100 each 3   hydrochlorothiazide (HYDRODIURIL) 25 MG tablet Take 1 tablet (25 mg total) by mouth daily. 90 tablet 3   hydrOXYzine (ATARAX) 10 MG tablet TAKE 1 TABLET BY MOUTH 3 TIMES DAILY AS NEEDED FOR ITCHING. 270 tablet 1   isosorbide mononitrate (IMDUR) 30 MG 24 hr tablet Take 1 tablet (30 mg total) by mouth daily. 90 tablet 3   Multiple Vitamin (MULTI-DAY VITAMINS PO) Take by mouth daily at 6 (six) AM.     NIFEdipine (PROCARDIA XL/NIFEDICAL-XL) 90 MG 24 hr tablet Take 1 tablet (90 mg total) by mouth daily. 90 tablet 3   nitroGLYCERIN (NITROSTAT) 0.4 MG SL tablet  Place 1 tablet (0.4 mg total) under the tongue every 5 (five) minutes as needed for chest pain. 25 tablet 3   oxyCODONE (OXY IR/ROXICODONE) 5 MG immediate release tablet Take 1 tablet (5 mg total) by mouth every 6 (six) hours as needed for severe pain. 45 tablet 0   polyethylene glycol (MIRALAX / GLYCOLAX) 17 g packet Take 17 g by mouth as needed for moderate constipation.     potassium chloride SA (KLOR-CON M) 20 MEQ tablet Take 1 tablet (20 mEq total) by mouth 2 (two) times daily. 180 tablet 3   predniSONE (DELTASONE) 10 MG tablet 3 tabs x3 days and then 2 tabs x3 days and then 1 tab x3 days.  Take w/ food. 18 tablet 0   rosuvastatin (CRESTOR) 5 MG tablet TAKE 1 TABLET  EVERY DAY 90 tablet 3   vitamin B-12 (CYANOCOBALAMIN) 1000 MCG tablet Take 1,000 mcg by mouth daily.     No current facility-administered medications for this visit.    PHYSICAL EXAMINATION: ECOG PERFORMANCE STATUS: 2 - Symptomatic, <50% confined to bed  There were no vitals filed for this visit. Wt Readings from Last 3 Encounters:  04/07/23 158 lb (71.7 kg)  03/17/23 159 lb 2 oz (72.2 kg)  02/25/23 159 lb 8 oz (72.3 kg)      GENERAL:alert, no distress and comfortable SKIN: skin color, texture, turgor are normal, no rashes or significant lesions EYES: normal, Conjunctiva are pink and non-injected, sclera clear NECK: supple, thyroid normal size, (-) non-tender, without nodularity LABORATORY DATA:  I have reviewed the data as listed    Latest Ref Rng & Units 03/31/2023   10:17 AM 03/18/2023    2:37 PM 03/17/2023   10:54 AM  CBC  WBC 4.0 - 10.5 K/uL 3.7  3.1  2.7   Hemoglobin 13.0 - 17.0 g/dL 8.4  8.1  8.0 Repeated and verified X2.   Hematocrit 39.0 - 52.0 % 25.2  24.7  25.1 Repeated and verified X2.   Platelets 150 - 400 K/uL 186  225  262.0         Latest Ref Rng & Units 03/18/2023    2:37 PM 03/17/2023   10:54 AM 02/25/2023   10:33 AM  CMP  Glucose 70 - 99 mg/dL 865  784  696   BUN 8 - 23 mg/dL 33  34  32    Creatinine 0.61 - 1.24 mg/dL 2.95  2.84  1.32   Sodium 135 - 145 mmol/L 142  140  141   Potassium 3.5 - 5.1 mmol/L 3.2  3.6  4.3   Chloride 98 - 111 mmol/L 104  102  103   CO2 22 - 32 mmol/L 27  28  30    Calcium 8.9 - 10.3 mg/dL 9.0  9.1  9.6   Total Protein 6.5 - 8.1 g/dL 6.5  6.5  6.9   Total Bilirubin 0.3 - 1.2 mg/dL 0.3  0.3  0.3   Alkaline Phos 38 - 126 U/L 104  96  137   AST 15 - 41 U/L 21  22  25    ALT 0 - 44 U/L 10  11  11        RADIOGRAPHIC STUDIES: I have personally reviewed the radiological images as listed and agreed with the findings in the report. No results found.    No orders of the defined types were placed in this encounter.  All questions were answered. The patient knows to call the clinic with any problems, questions or concerns. No barriers to learning was detected. The total time spent in the appointment was 25 minutes.     Malachy Mood, MD 04/14/2023   Carolin Coy, CMA, am acting as scribe for Malachy Mood, MD.   I have reviewed the above documentation for accuracy and completeness, and I agree with the above.

## 2023-04-15 ENCOUNTER — Other Ambulatory Visit: Payer: Self-pay | Admitting: Hematology

## 2023-04-15 ENCOUNTER — Other Ambulatory Visit: Payer: Self-pay

## 2023-04-15 ENCOUNTER — Telehealth: Payer: Self-pay | Admitting: Nurse Practitioner

## 2023-04-15 ENCOUNTER — Ambulatory Visit (HOSPITAL_COMMUNITY): Admission: RE | Admit: 2023-04-15 | Payer: Medicare HMO | Source: Ambulatory Visit

## 2023-04-15 ENCOUNTER — Encounter (HOSPITAL_BASED_OUTPATIENT_CLINIC_OR_DEPARTMENT_OTHER): Payer: Self-pay | Admitting: Emergency Medicine

## 2023-04-15 ENCOUNTER — Telehealth: Payer: Self-pay | Admitting: Hematology

## 2023-04-15 ENCOUNTER — Inpatient Hospital Stay: Payer: Medicare HMO

## 2023-04-15 ENCOUNTER — Encounter: Payer: Self-pay | Admitting: Hematology

## 2023-04-15 ENCOUNTER — Emergency Department (HOSPITAL_BASED_OUTPATIENT_CLINIC_OR_DEPARTMENT_OTHER)
Admission: EM | Admit: 2023-04-15 | Discharge: 2023-04-15 | Disposition: A | Discharge status: Home / Self Care | Payer: Medicare HMO | Attending: Emergency Medicine | Admitting: Emergency Medicine

## 2023-04-15 DIAGNOSIS — C799 Secondary malignant neoplasm of unspecified site: Secondary | ICD-10-CM | POA: Diagnosis not present

## 2023-04-15 DIAGNOSIS — C7951 Secondary malignant neoplasm of bone: Secondary | ICD-10-CM | POA: Insufficient documentation

## 2023-04-15 DIAGNOSIS — Z8546 Personal history of malignant neoplasm of prostate: Secondary | ICD-10-CM | POA: Insufficient documentation

## 2023-04-15 DIAGNOSIS — C61 Malignant neoplasm of prostate: Secondary | ICD-10-CM | POA: Insufficient documentation

## 2023-04-15 DIAGNOSIS — D630 Anemia in neoplastic disease: Secondary | ICD-10-CM | POA: Diagnosis not present

## 2023-04-15 DIAGNOSIS — R339 Retention of urine, unspecified: Secondary | ICD-10-CM | POA: Diagnosis not present

## 2023-04-15 DIAGNOSIS — M4802 Spinal stenosis, cervical region: Secondary | ICD-10-CM | POA: Diagnosis not present

## 2023-04-15 DIAGNOSIS — Z85830 Personal history of malignant neoplasm of bone: Secondary | ICD-10-CM | POA: Insufficient documentation

## 2023-04-15 DIAGNOSIS — M5021 Other cervical disc displacement,  high cervical region: Secondary | ICD-10-CM | POA: Diagnosis not present

## 2023-04-15 DIAGNOSIS — M50221 Other cervical disc displacement at C4-C5 level: Secondary | ICD-10-CM | POA: Diagnosis not present

## 2023-04-15 DIAGNOSIS — Z923 Personal history of irradiation: Secondary | ICD-10-CM | POA: Diagnosis not present

## 2023-04-15 DIAGNOSIS — Z79899 Other long term (current) drug therapy: Secondary | ICD-10-CM | POA: Diagnosis not present

## 2023-04-15 LAB — URINALYSIS, ROUTINE W REFLEX MICROSCOPIC
Bacteria, UA: NONE SEEN
Bilirubin Urine: NEGATIVE
Glucose, UA: 500 mg/dL — AB
Ketones, ur: NEGATIVE mg/dL
Leukocytes,Ua: NEGATIVE
Nitrite: NEGATIVE
Protein, ur: 30 mg/dL — AB
Specific Gravity, Urine: 1.018 (ref 1.005–1.030)
pH: 5.5 (ref 5.0–8.0)

## 2023-04-15 LAB — CBC WITH DIFFERENTIAL (CANCER CENTER ONLY)
Abs Immature Granulocytes: 0.09 10*3/uL — ABNORMAL HIGH (ref 0.00–0.07)
Basophils Absolute: 0 10*3/uL (ref 0.0–0.1)
Basophils Relative: 0 %
Eosinophils Absolute: 0 10*3/uL (ref 0.0–0.5)
Eosinophils Relative: 0 %
HCT: 29.2 % — ABNORMAL LOW (ref 39.0–52.0)
Hemoglobin: 10 g/dL — ABNORMAL LOW (ref 13.0–17.0)
Immature Granulocytes: 1 %
Lymphocytes Relative: 6 %
Lymphs Abs: 0.5 10*3/uL — ABNORMAL LOW (ref 0.7–4.0)
MCH: 31.8 pg (ref 26.0–34.0)
MCHC: 34.2 g/dL (ref 30.0–36.0)
MCV: 93 fL (ref 80.0–100.0)
Monocytes Absolute: 0.5 10*3/uL (ref 0.1–1.0)
Monocytes Relative: 6 %
Neutro Abs: 7.7 10*3/uL (ref 1.7–7.7)
Neutrophils Relative %: 87 %
Platelet Count: 219 10*3/uL (ref 150–400)
RBC: 3.14 MIL/uL — ABNORMAL LOW (ref 4.22–5.81)
RDW: 17.8 % — ABNORMAL HIGH (ref 11.5–15.5)
WBC Count: 8.8 10*3/uL (ref 4.0–10.5)
nRBC: 0 % (ref 0.0–0.2)

## 2023-04-15 MED ORDER — LIDOCAINE HCL URETHRAL/MUCOSAL 2 % EX GEL
1.0000 | Freq: Once | CUTANEOUS | Status: AC
  Administered 2023-04-15: 1 via URETHRAL
  Filled 2023-04-15: qty 11

## 2023-04-15 MED ORDER — DEXAMETHASONE 4 MG PO TABS: 4.0000 mg | ORAL_TABLET | Freq: Two times a day (BID) | ORAL | 0 refills | Status: DC

## 2023-04-15 NOTE — ED Triage Notes (Signed)
Pt arrived POV from home with family, pt caox4, ambulatory, NAD c/o urinary retention with last normal urination being yesterday. Hx prostate cancer, has had previous issues with urinary retention requiring urinary catheter. Pt c/o bladder discomfort but states no pain. Pt also c/o not having regular BM over the past week as well.

## 2023-04-15 NOTE — ED Provider Notes (Signed)
[place] EMERGENCY DEPARTMENT AT [facility] Provider Note   CSN: [id number] Arrival date & time: 04/15/23  5621     History  Chief Complaint  Patient presents with   Urinary Retention    Wayne Perkins is a 82 y.o. adult.  Patient here urinary retention.  Unable to make urine this morning.  Has not made any since last night.  History of prostate cancer not on any active treatment but has had some treatment this year.  Has metastasis to his bones.  Is a little bit constipated as well.  But he uses MiraLAX.  He is passing gas.  Is not have any nausea or vomiting.  No recent abdominal surgery.  He has had catheter in the past due to urinary retention in the past.  He follows with urology.  He denies any chest pain shortness of breath or weakness numbness or tingling.  He denies any blood in the urine.  The history is provided by the patient.       Home Medications Prior to Admission medications   Medication Sig Start Date End Date Taking? Authorizing Provider  Alcohol Swabs (B-D SINGLE USE SWABS REGULAR) PADS  11/05/19   [provider]  Blood Glucose Calibration (ACCU-CHEK AVIVA) SOLN  11/05/19   [provider]  blood glucose meter kit and supplies Use once a day in the AM for fasting sugars. 11/02/19   Orland Mustard, MD  calcium-vitamin D (OSCAL WITH D) 500-200 MG-UNIT tablet Take 1 tablet by mouth 2 (two) times daily. 09/04/20   Benjiman Core, MD  Cholecalciferol (VITAMIN D3) 50 MCG (2000 UT) TABS Take by mouth.    [provider]  clopidogrel (PLAVIX) 75 MG tablet TAKE 1 TABLET EVERY DAY 08/16/22   Sheliah Hatch, MD  Dulaglutide (TRULICITY) 0.75 MG/0.5ML SOPN Inject 0.75 mg into the skin once a week. 12/31/19   Orland Mustard, MD  DULoxetine (CYMBALTA) 30 MG capsule Take 1 capsule (30 mg total) by mouth daily. 04/14/23   Malachy Mood, MD  Ez Smart Blood Glucose Lancets MISC by Does not apply route. 10/24/19   Orland Mustard, MD  glucose blood test  strip Use as instructed 10/24/19   Orland Mustard, MD  hydrochlorothiazide (HYDRODIURIL) 25 MG tablet Take 1 tablet (25 mg total) by mouth daily. 03/17/23   Sheliah Hatch, MD  hydrOXYzine (ATARAX) 10 MG tablet TAKE 1 TABLET BY MOUTH 3 TIMES DAILY AS NEEDED FOR ITCHING. 03/17/23   Sheliah Hatch, MD  isosorbide mononitrate (IMDUR) 30 MG 24 hr tablet Take 1 tablet (30 mg total) by mouth daily. 08/17/22 08/12/23  Alver Sorrow, NP  Multiple Vitamin (MULTI-DAY VITAMINS PO) Take by mouth daily at 6 (six) AM.    [provider]  NIFEdipine (PROCARDIA XL/NIFEDICAL-XL) 90 MG 24 hr tablet Take 1 tablet (90 mg total) by mouth daily. 03/17/23   Sheliah Hatch, MD  nitroGLYCERIN (NITROSTAT) 0.4 MG SL tablet Place 1 tablet (0.4 mg total) under the tongue every 5 (five) minutes as needed for chest pain. 08/09/22 11/07/22  Alver Sorrow, NP  oxyCODONE (OXY IR/ROXICODONE) 5 MG immediate release tablet Take 1 tablet (5 mg total) by mouth every 6 (six) hours as needed for severe pain. 03/11/23   Georga Kaufmann T, PA-C  polyethylene glycol (MIRALAX / GLYCOLAX) 17 g packet Take 17 g by mouth as needed for moderate constipation.    [provider]  potassium chloride SA (KLOR-CON M) 20 MEQ tablet Take 1  tablet (20 mEq total) by mouth 2 (two) times daily. 03/17/23   Sheliah Hatch, MD  predniSONE (DELTASONE) 10 MG tablet Take 1 tablet (10 mg total) by mouth daily with breakfast. 04/14/23   Malachy Mood, MD  rosuvastatin (CRESTOR) 5 MG tablet TAKE 1 TABLET EVERY DAY 12/22/22   Jodelle Red, MD  vitamin B-12 (CYANOCOBALAMIN) 1000 MCG tablet Take 1,000 mcg by mouth daily.    [provider]      Allergies    Tramadol, Glipizide, and Statins    Review of Systems   Review of Systems  Physical Exam Updated Vital Signs BP 127/68   Pulse 86   Temp 97.8 F (36.6 C) (Oral)   Resp 20   Ht 5\' 10"  (1.778 m)   Wt 70.2 kg   SpO2 95%   BMI 22.21 kg/m  Physical  Exam Vitals and nursing note reviewed.  Constitutional:      General: He is not in acute distress.    Appearance: Normal appearance. He is well-developed. He is not ill-appearing.  HENT:     Head: Normocephalic and atraumatic.     Nose: Nose normal.     Mouth/Throat:     Mouth: Mucous membranes are moist.  Eyes:     Conjunctiva/sclera: Conjunctivae normal.     Pupils: Pupils are equal, round, and reactive to light.  Cardiovascular:     Rate and Rhythm: Normal rate and regular rhythm.     Pulses: Normal pulses.     Heart sounds: Normal heart sounds. No murmur heard. Pulmonary:     Effort: Pulmonary effort is normal. No respiratory distress.     Breath sounds: Normal breath sounds.  Abdominal:     General: There is distension.     Palpations: Abdomen is soft.     Tenderness: There is no abdominal tenderness.  Musculoskeletal:        General: No swelling.     Cervical back: Normal range of motion and neck supple.  Skin:    General: Skin is warm and dry.     Capillary Refill: Capillary refill takes less than 2 seconds.  Neurological:     General: No focal deficit present.     Mental Status: He is alert.  Psychiatric:        Mood and Affect: Mood normal.     ED Results / Procedures / Treatments   Labs (all labs ordered are listed, but only abnormal results are displayed) Labs Reviewed  URINALYSIS, ROUTINE W REFLEX MICROSCOPIC - Abnormal; Notable for the following components:      Result Value   Glucose, UA 500 (*)    Hgb urine dipstick TRACE (*)    Protein, ur 30 (*)    All other components within normal limits    EKG None  Radiology No results found.  Procedures Procedures    Medications Ordered in ED Medications  lidocaine (XYLOCAINE) 2 % jelly 1 Application (1 Application Urethral Given 04/15/23 [phone])    ED Course/ Medical Decision Making/ A&P                                 Medical Decision Making Amount and/or Complexity of Data Reviewed Labs:  ordered.   Wayne Perkins is here with urinary retention.  History of prostate cancer with bone metastasis.  Unable to make any urine here since last night.  History of the same.  Not on any  current chemotherapy.  He states some constipation but denies any nausea or vomiting.  He is passing gas.  A little bit of distention on abdominal exam but not a lot of tenderness.  He feels bloated.  Foley catheter was placed after bladder scan showed about 700 cc of urine.  Foley catheter was placed easily by nursing staff.  He had immediately good improvement of his pain.  He does not feel distended.  Repeat abdominal exam is benign.  I have no concern for other acute intra-abdominal process.  No concern for bowel obstruction or other acute process.  Suspect that this could be prostate related given his cancer history.  He does struggle with some constipation which could be playing a role.  Will have him restart MiraLAX.  Ultimately he has normal vitals.  He is well-appearing.  I think we can hold off on imaging at this time and let his primary urologist and primary care doctor see how he responds to Foley catheter.  He understands return precautions.  Discharged in good condition.  This chart was dictated using voice recognition software.  Despite best efforts to proofread,  errors can occur which can change the documentation meaning.         Final Clinical Impression(s) / ED Diagnoses Final diagnoses:  Urinary retention    Rx / DC Orders ED Discharge Orders     None         Virgina Norfolk, DO 04/15/23 9528

## 2023-04-15 NOTE — Telephone Encounter (Signed)
I called pt and spoke with pt and his wife, regarding his cervical spine MRI findings from today.  He has extensive bone metastasis with epidural invasion, some areas with moderate to severe spinal stenosis, mainly from degenerative changes than tumor invasion (per radiologist).  Patient's neck pain has improved with steroids, no other new symptoms.  He had urinary retention and was seen in the ED this morning, still has a Foley in.   I called in dexamethasone 4 mg twice daily, will replace his prednisone 30 mg daily.  He will pick up and start.  I have spoken with Dr. Basilio Cairo, we will plan to see him again on Monday for urgent radiation therapy.  Patient knows to go to emergency room if he has new neurological symptoms, especially difficulty walking, upper extremity weakness or numbness, or dyspnea.  Malachy Mood MD 04/15/2023 6:35 PM

## 2023-04-15 NOTE — Discharge Instructions (Signed)
Follow-up with your urologist.  Follow-up with your primary care doctor.  Please return if symptoms worsen as discussed.  Would also consider using some MiraLAX to help with bowel movements as well.

## 2023-04-15 NOTE — Telephone Encounter (Signed)
Left patient a message in regards to scheduled appointment times/dates; left callback number for patient if needed for rescheduling

## 2023-04-16 ENCOUNTER — Other Ambulatory Visit: Payer: Self-pay

## 2023-04-16 ENCOUNTER — Emergency Department (HOSPITAL_BASED_OUTPATIENT_CLINIC_OR_DEPARTMENT_OTHER)
Admission: EM | Admit: 2023-04-16 | Discharge: 2023-04-16 | Disposition: A | Discharge status: Home / Self Care | Payer: Medicare HMO | Attending: Emergency Medicine | Admitting: Emergency Medicine

## 2023-04-16 ENCOUNTER — Encounter (HOSPITAL_BASED_OUTPATIENT_CLINIC_OR_DEPARTMENT_OTHER): Payer: Self-pay | Admitting: Emergency Medicine

## 2023-04-16 DIAGNOSIS — R339 Retention of urine, unspecified: Secondary | ICD-10-CM | POA: Diagnosis not present

## 2023-04-16 DIAGNOSIS — C61 Malignant neoplasm of prostate: Secondary | ICD-10-CM | POA: Insufficient documentation

## 2023-04-16 DIAGNOSIS — M25512 Pain in left shoulder: Secondary | ICD-10-CM

## 2023-04-16 DIAGNOSIS — Z794 Long term (current) use of insulin: Secondary | ICD-10-CM | POA: Diagnosis not present

## 2023-04-16 DIAGNOSIS — Z466 Encounter for fitting and adjustment of urinary device: Secondary | ICD-10-CM | POA: Insufficient documentation

## 2023-04-16 DIAGNOSIS — M4802 Spinal stenosis, cervical region: Secondary | ICD-10-CM

## 2023-04-16 LAB — BASIC METABOLIC PANEL
Anion gap: 12 (ref 5–15)
BUN: 51 mg/dL — ABNORMAL HIGH (ref 8–23)
CO2: 27 mmol/L (ref 22–32)
Calcium: 9 mg/dL (ref 8.9–10.3)
Chloride: 96 mmol/L — ABNORMAL LOW (ref 98–111)
Creatinine, Ser: 1.41 mg/dL — ABNORMAL HIGH (ref 0.61–1.24)
GFR, Estimated: 50 mL/min — ABNORMAL LOW (ref >60)
Glucose, Bld: 266 mg/dL — ABNORMAL HIGH (ref 70–99)
Potassium: 3.8 mmol/L (ref 3.5–5.1)
Sodium: 135 mmol/L (ref 135–145)

## 2023-04-16 LAB — CBC WITH DIFFERENTIAL/PLATELET
Abs Immature Granulocytes: 0.1 10*3/uL — ABNORMAL HIGH (ref 0.00–0.07)
Basophils Absolute: 0 10*3/uL (ref 0.0–0.1)
Basophils Relative: 0 %
Eosinophils Absolute: 0 10*3/uL (ref 0.0–0.5)
Eosinophils Relative: 0 %
HCT: 28.1 % — ABNORMAL LOW (ref 39.0–52.0)
Hemoglobin: 9.5 g/dL — ABNORMAL LOW (ref 13.0–17.0)
Immature Granulocytes: 1 %
Lymphocytes Relative: 6 %
Lymphs Abs: 0.4 10*3/uL — ABNORMAL LOW (ref 0.7–4.0)
MCH: 31.4 pg (ref 26.0–34.0)
MCHC: 33.8 g/dL (ref 30.0–36.0)
MCV: 92.7 fL (ref 80.0–100.0)
Monocytes Absolute: 0.6 10*3/uL (ref 0.1–1.0)
Monocytes Relative: 8 %
Neutro Abs: 6 10*3/uL (ref 1.7–7.7)
Neutrophils Relative %: 85 %
Platelets: 213 10*3/uL (ref 150–400)
RBC: 3.03 MIL/uL — ABNORMAL LOW (ref 4.22–5.81)
RDW: 18 % — ABNORMAL HIGH (ref 11.5–15.5)
WBC: 7 10*3/uL (ref 4.0–10.5)
nRBC: 0 % (ref 0.0–0.2)

## 2023-04-16 LAB — TROPONIN I (HIGH SENSITIVITY): Troponin I (High Sensitivity): 15 ng/L (ref <18)

## 2023-04-16 MED ORDER — SODIUM CHLORIDE 0.9 % IV BOLUS
500.0000 mL | Freq: Once | INTRAVENOUS | Status: AC
  Administered 2023-04-16: 500 mL via INTRAVENOUS

## 2023-04-16 NOTE — ED Notes (Signed)
Patient wanting foley catheter removed.  Catheter was placed yesterday.  Discuss that this is usually best done with follow up with urology.  Discuss with patient the risk of removal including urinary retention and the need to return to ER. Patient states understand such and wanting it removed.  Foley catcher removed without difficulty.  Will monitor for urination.

## 2023-04-16 NOTE — ED Notes (Signed)
Patient voided 500 cc urine without difficulty

## 2023-04-16 NOTE — ED Triage Notes (Signed)
Pt has had neck pain for about 3 weeks, had MRI yesterday, describes it as nerve pain. Today his left upper arm is hurting, same type of pain, he is on dexamethsone.

## 2023-04-16 NOTE — ED Provider Notes (Signed)
[place] EMERGENCY DEPARTMENT AT [facility] Provider Note   CSN: [id number] Arrival date & time: 04/16/23  [id number]     History {Add pertinent medical, surgical, social history, OB history to HPI:1} No chief complaint on file.   Wayne Perkins is a 82 y.o. adult.  Patient is a 82 yo male with prostate cancer and mets to the bone presenting for left shoulder pain. Pt had MRI yesterday that demonstrates mets, tumor, and cervical foraminal impingment of cervical region. Pt states left should pain starts in the neck and radiates to the mid upper extremity. States it is worse when lifting his left arm and crossing the body with his arm. No pain at rest. Denies chest pain or sob.   Chart review also demonstrates that pt was seen in ED yesterday for acute urinary retention. Dc'd with foley cath. Pt states his oncologist stated this morning the urinary retention could have been from constipation. Pt states he wants to have the foley removed today while he is here.   The history is provided by the patient. No language interpreter was used.       Home Medications Prior to Admission medications   Medication Sig Start Date End Date Taking? Authorizing Provider  Alcohol Swabs (B-D SINGLE USE SWABS REGULAR) PADS  11/05/19   [provider]  Blood Glucose Calibration (ACCU-CHEK AVIVA) SOLN  11/05/19   [provider]  blood glucose meter kit and supplies Use once a day in the AM for fasting sugars. 11/02/19   Orland Mustard, MD  calcium-vitamin D (OSCAL WITH D) 500-200 MG-UNIT tablet Take 1 tablet by mouth 2 (two) times daily. 09/04/20   Benjiman Core, MD  Cholecalciferol (VITAMIN D3) 50 MCG (2000 UT) TABS Take by mouth.    [provider]  clopidogrel (PLAVIX) 75 MG tablet TAKE 1 TABLET EVERY DAY 08/16/22   Sheliah Hatch, MD  dexamethasone (DECADRON) 4 MG tablet Take 1 tablet (4 mg total) by mouth 2 (two) times daily. 04/15/23   Malachy Mood, MD  Dulaglutide  (TRULICITY) 0.75 MG/0.5ML SOPN Inject 0.75 mg into the skin once a week. 12/31/19   Orland Mustard, MD  DULoxetine (CYMBALTA) 30 MG capsule Take 1 capsule (30 mg total) by mouth daily. 04/14/23   Malachy Mood, MD  Ez Smart Blood Glucose Lancets MISC by Does not apply route. 10/24/19   Orland Mustard, MD  glucose blood test strip Use as instructed 10/24/19   Orland Mustard, MD  hydrochlorothiazide (HYDRODIURIL) 25 MG tablet Take 1 tablet (25 mg total) by mouth daily. 03/17/23   Sheliah Hatch, MD  hydrOXYzine (ATARAX) 10 MG tablet TAKE 1 TABLET BY MOUTH 3 TIMES DAILY AS NEEDED FOR ITCHING. 03/17/23   Sheliah Hatch, MD  isosorbide mononitrate (IMDUR) 30 MG 24 hr tablet Take 1 tablet (30 mg total) by mouth daily. 08/17/22 08/12/23  Alver Sorrow, NP  Multiple Vitamin (MULTI-DAY VITAMINS PO) Take by mouth daily at 6 (six) AM.    [provider]  NIFEdipine (PROCARDIA XL/NIFEDICAL-XL) 90 MG 24 hr tablet Take 1 tablet (90 mg total) by mouth daily. 03/17/23   Sheliah Hatch, MD  nitroGLYCERIN (NITROSTAT) 0.4 MG SL tablet Place 1 tablet (0.4 mg total) under the tongue every 5 (five) minutes as needed for chest pain. 08/09/22 11/07/22  Alver Sorrow, NP  oxyCODONE (OXY IR/ROXICODONE) 5 MG immediate release tablet Take 1 tablet (5 mg total) by mouth every 6 (six) hours as needed for severe pain.  03/11/23   Georga Kaufmann T, PA-C  polyethylene glycol (MIRALAX / GLYCOLAX) 17 g packet Take 17 g by mouth as needed for moderate constipation.    [provider]  potassium chloride SA (KLOR-CON M) 20 MEQ tablet Take 1 tablet (20 mEq total) by mouth 2 (two) times daily. 03/17/23   Sheliah Hatch, MD  predniSONE (DELTASONE) 10 MG tablet Take 1 tablet (10 mg total) by mouth daily with breakfast. 04/14/23   Malachy Mood, MD  rosuvastatin (CRESTOR) 5 MG tablet TAKE 1 TABLET EVERY DAY 12/22/22   Jodelle Red, MD  vitamin B-12 (CYANOCOBALAMIN) 1000 MCG tablet Take 1,000 mcg by mouth  daily.    [provider]      Allergies    Tramadol, Glipizide, and Statins    Review of Systems   Review of Systems  Constitutional:  Negative for chills and fever.  HENT:  Negative for ear pain and sore throat.   Eyes:  Negative for pain and visual disturbance.  Respiratory:  Negative for cough and shortness of breath.   Cardiovascular:  Negative for chest pain and palpitations.  Gastrointestinal:  Negative for abdominal pain and vomiting.  Genitourinary:  Negative for dysuria and hematuria.  Musculoskeletal:  Positive for neck pain. Negative for arthralgias and back pain.  Skin:  Negative for color change and rash.  Neurological:  Negative for seizures and syncope.  All other systems reviewed and are negative.   Physical Exam Updated Vital Signs BP (!) 164/87   Pulse 91   Temp 98 F (36.7 C) (Oral)   Resp 20   SpO2 98%  Physical Exam Vitals and nursing note reviewed.  Constitutional:      General: He is not in acute distress.    Appearance: He is well-developed.  HENT:     Head: Normocephalic and atraumatic.  Eyes:     Conjunctiva/sclera: Conjunctivae normal.  Cardiovascular:     Rate and Rhythm: Normal rate and regular rhythm.     Heart sounds: No murmur heard. Pulmonary:     Effort: Pulmonary effort is normal. No respiratory distress.     Breath sounds: Normal breath sounds.  Abdominal:     Palpations: Abdomen is soft.     Tenderness: There is no abdominal tenderness.  Musculoskeletal:        General: No swelling.     Cervical back: Neck supple.  Skin:    General: Skin is warm and dry.     Capillary Refill: Capillary refill takes less than 2 seconds.  Neurological:     Mental Status: He is alert.  Psychiatric:        Mood and Affect: Mood normal.     ED Results / Procedures / Treatments   Labs (all labs ordered are listed, but only abnormal results are displayed) Labs Reviewed  CBC WITH DIFFERENTIAL/PLATELET  BASIC METABOLIC PANEL   TROPONIN I (HIGH SENSITIVITY)    EKG EKG Interpretation Date/Time:  Saturday April 16 2023 10:01:38 EDT Ventricular Rate:  91 PR Interval:  199 QRS Duration:  203 QT Interval:  397 QTC Calculation: 489 R Axis:   15  Text Interpretation: Sinus rhythm Ventricular premature complex Consider left atrial enlargement Probable left ventricular hypertrophy Borderline prolonged QT interval Confirmed by Edwin Dada ([id number]) on 04/16/2023 10:36:18 AM  Radiology MR Cervical Spine Wo Contrast  Result Date: 04/15/2023 CLINICAL DATA:  Metastatic disease evaluation. EXAM: MRI CERVICAL SPINE WITHOUT CONTRAST TECHNIQUE: Multiplanar, multisequence MR imaging of the cervical spine was performed.  No intravenous contrast was administered. COMPARISON:  Head CT 08/09/2022.  CT cervical spine 07/31/2019. FINDINGS: Alignment: 4 mm anterolisthesis of C4 on C5. Vertebrae: Diffusely low marrow signal, consistent with known metastatic prostate cancer. Dorsal epidural extension of tumor from C2-3 through at least C6-7. Cord: Normal spinal cord signal and volume. Posterior Fossa, vertebral arteries, paraspinal tissues: Dorsal paraspinal extension of tumor along the C4 spinous process. Disc levels: C2-C3: Disc bulge, ligamentum flavum buckling and epidural tumor results in moderate spinal canal stenosis. Facet arthropathy and uncovertebral joint spurring results in moderate bilateral neural foraminal narrowing. C3-C4: Dorsal epidural tumor results in moderate spinal canal stenosis. Facet arthropathy and uncovertebral joint spurring contribute to moderate bilateral neural foraminal narrowing. C4-C5: Disc bulge, ligamentum flavum buckling and dorsal epidural tumor results in moderate-to-severe spinal canal stenosis. Facet arthropathy and uncovertebral joint spurring contribute to severe bilateral neural foraminal narrowing. C5-C6: Disc-osteophyte complex and dorsal epidural tumor results in mild-to-moderate spinal canal stenosis.  Facet arthropathy and uncovertebral joint spurring results in severe left and moderate right neural foraminal narrowing. C6-C7: Disc bulge, ligamentum flavum buckling and dorsal epidural tumor results in severe spinal canal stenosis. Uncovertebral joint spurring contributes to moderate left neural foraminal narrowing. C7-T1: Moderate bilateral facet arthropathy. No spinal canal stenosis or neural foraminal narrowing. IMPRESSION: 1. Diffusely low marrow signal, consistent with known metastatic prostate cancer. Dorsal epidural extension of tumor from C2-3 through at least C6-7, with associated multilevel spinal canal stenosis, severe at C6-7 and moderate-to-severe at C4-5. Postcontrast imaging would better delineate the extent of epidural tumor. 2. Multilevel neural foraminal narrowing, severe bilaterally at C4-5 and on the left at C5-6. Electronically Signed   By: Orvan Falconer M.D.   On: 04/15/2023 17:07    Procedures Procedures  {Document cardiac monitor, telemetry assessment procedure when appropriate:1}  Medications Ordered in ED Medications - No data to display  ED Course/ Medical Decision Making/ A&P   {   Click here for ABCD2, HEART and other calculatorsREFRESH Note before signing :1}                              Medical Decision Making Amount and/or Complexity of Data Reviewed Labs: ordered.   ***  {Document critical care time when appropriate:1} {Document review of labs and clinical decision tools ie heart score, Chads2Vasc2 etc:1}  {Document your independent review of radiology images, and any outside records:1} {Document your discussion with family members, caretakers, and with consultants:1} {Document social determinants of health affecting pt's care:1} {Document your decision making why or why not admission, treatments were needed:1} Final Clinical Impression(s) / ED Diagnoses Final diagnoses:  None    Rx / DC Orders ED Discharge Orders     None

## 2023-04-18 ENCOUNTER — Encounter: Payer: Self-pay | Admitting: Radiation Oncology

## 2023-04-18 DIAGNOSIS — C7951 Secondary malignant neoplasm of bone: Secondary | ICD-10-CM | POA: Diagnosis not present

## 2023-04-18 DIAGNOSIS — Z191 Hormone sensitive malignancy status: Secondary | ICD-10-CM | POA: Diagnosis not present

## 2023-04-18 DIAGNOSIS — C61 Malignant neoplasm of prostate: Secondary | ICD-10-CM | POA: Diagnosis not present

## 2023-04-19 ENCOUNTER — Encounter: Payer: Self-pay | Admitting: Urology

## 2023-04-19 ENCOUNTER — Ambulatory Visit
Admission: RE | Admit: 2023-04-19 | Discharge: 2023-04-19 | Disposition: A | Discharge status: Home / Self Care | Payer: Medicare HMO | Source: Ambulatory Visit | Attending: Hematology | Admitting: Hematology

## 2023-04-19 ENCOUNTER — Ambulatory Visit
Admission: RE | Admit: 2023-04-19 | Discharge: 2023-04-19 | Disposition: A | Discharge status: Home / Self Care | Payer: Medicare HMO | Source: Ambulatory Visit | Attending: Radiation Oncology | Admitting: Radiation Oncology

## 2023-04-19 ENCOUNTER — Ambulatory Visit
Admission: RE | Admit: 2023-04-19 | Discharge: 2023-04-19 | Disposition: A | Discharge status: Home / Self Care | Payer: Medicare HMO | Source: Ambulatory Visit | Attending: Urology | Admitting: Urology

## 2023-04-19 VITALS — BP 139/79 | HR 60 | Temp 97.5°F | Resp 20 | Ht 70.0 in | Wt 149.4 lb

## 2023-04-19 DIAGNOSIS — C61 Malignant neoplasm of prostate: Secondary | ICD-10-CM | POA: Diagnosis not present

## 2023-04-19 DIAGNOSIS — Z7985 Long-term (current) use of injectable non-insulin antidiabetic drugs: Secondary | ICD-10-CM | POA: Diagnosis not present

## 2023-04-19 DIAGNOSIS — I10 Essential (primary) hypertension: Secondary | ICD-10-CM | POA: Diagnosis not present

## 2023-04-19 DIAGNOSIS — Z923 Personal history of irradiation: Secondary | ICD-10-CM | POA: Diagnosis not present

## 2023-04-19 DIAGNOSIS — I509 Heart failure, unspecified: Secondary | ICD-10-CM | POA: Insufficient documentation

## 2023-04-19 DIAGNOSIS — Z7902 Long term (current) use of antithrombotics/antiplatelets: Secondary | ICD-10-CM | POA: Diagnosis not present

## 2023-04-19 DIAGNOSIS — Z191 Hormone sensitive malignancy status: Secondary | ICD-10-CM | POA: Diagnosis not present

## 2023-04-19 DIAGNOSIS — Z8673 Personal history of transient ischemic attack (TIA), and cerebral infarction without residual deficits: Secondary | ICD-10-CM | POA: Insufficient documentation

## 2023-04-19 DIAGNOSIS — R339 Retention of urine, unspecified: Secondary | ICD-10-CM | POA: Insufficient documentation

## 2023-04-19 DIAGNOSIS — E119 Type 2 diabetes mellitus without complications: Secondary | ICD-10-CM | POA: Diagnosis not present

## 2023-04-19 DIAGNOSIS — Z79899 Other long term (current) drug therapy: Secondary | ICD-10-CM | POA: Insufficient documentation

## 2023-04-19 DIAGNOSIS — C7951 Secondary malignant neoplasm of bone: Secondary | ICD-10-CM | POA: Diagnosis not present

## 2023-04-19 DIAGNOSIS — Z7952 Long term (current) use of systemic steroids: Secondary | ICD-10-CM | POA: Insufficient documentation

## 2023-04-19 DIAGNOSIS — D649 Anemia, unspecified: Secondary | ICD-10-CM | POA: Insufficient documentation

## 2023-04-19 NOTE — Progress Notes (Signed)
  Radiation Oncology         [phone] ________________________________  Name: Wayne Perkins MRN: [id number]  Date: 04/19/2023  DOB: 05-Jun-1941  SIMULATION AND TREATMENT PLANNING NOTE    ICD-10-CM   1. 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  Identity was confirmed.  All relevant records and images related to the planned course of therapy were reviewed.  The patient freely provided informed written consent to proceed with treatment after reviewing the details related to the planned course of therapy. The consent form was witnessed and verified by the simulation staff.  Then, the patient was set-up in a stable reproducible  supine position for radiation therapy.  CT images were obtained.  Surface markings were placed.  The CT images were loaded into the planning software.  Then the target and avoidance structures were contoured.  Treatment planning then occurred.  The radiation prescription was entered and confirmed.  Then, I designed and supervised the construction of a total of multiple medically necessary complex treatment device including a custom made thermoplastic mask used for immobilization, and MLC collimator apertures for radiotherapy from the right and left side, with independent collimation for each to account for beam divergence.  I have requested : 3D plan with DVH of spinal cord, parotids, and target  PLAN:  The involved C-spin will be treated to 30 Gy in 10 fractions.  ________________________________  Artist Pais Kathrynn Running, M.D.

## 2023-04-19 NOTE — Progress Notes (Signed)
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    ICD-10-CM   1. Prostate cancer (HCC)  C61     2. Malignant neoplasm of prostate metastatic to bone [facility])  C61 Ambulatory referral to Social Work   C79.51       HISTORY OF PRESENT ILLNESS: Wayne Perkins is a 82 y.o. adult seen at the request of [name].  He is well-known to our service having recently completed 5 of 6 scheduled Xofigo infusions prior to discontinuing due to poor tolerability with significant anemia requiring blood transfusion.   In summary, he was initially diagnosed with low volume Gleason 3+3 prostate cancer in 2012. He proceeded with surveillance until 2015, when he elected to proceed with IMRT while living in [place]. Sometime thereafter, he received 9 months of ADT for a rising PSA and questionable left ischial lesion, but he discontinued due to side effects including hot flashes, weight gain, and development of diabetes requiring treatment with metformin. His PSA rose again in 2019, though staging scans in 12/2018 were negative. When his PSA reached 24.7 in 02/2019, he resumed ADT through 08/2019. Restaging CT A/P and bone scan in 01/2020 revealed new bone lesions in the midthoracic spine and right posterior 4th rib. We met with him in our multidisciplinary prostate clinic on 02/19/20 and given he was asymptomatic at that time, the recommendation was to resume ADT and add Xtandi.  He started Xtandi under the care of Dr. Clelia Croft in 01/2020 but discontinued after 3 weeks due to poor tolerance. He then tried [place] in 12/2020 but also discontinued this shortly thereafter. He  resumed ADT on 02/29/20, which he continues on currently, every 3 months. Despite this, his PSA continued to rise and he began experiencing intermittent bone pain in his lower back and legs in 04/2022. He declined additional systemic therapies, as well as bone directed therapy.  We saw him back in the clinic for re-consult on 06/15/22 and at that time, his pain was controlled with Tylenol and only occasionally any need for narcotic pain medication if/when he had been particularly active in his shop/garage.  A  PSMA PET scan was performed 08/09/22 and showed significant progression of disease with diffuse sclerotic bone metastases, showing intense radiotracer accumulation throughout the axial and appendicular skeleton as well as mild metastatic lymphadenopathy in the mediastinum, right retrocrural region, abdominal retroperitoneum, and right common and external iliac chains. Due to the minimal nodal involvement and diffuse bony metastases, he elected to proceed with Xofigo infusions and completed 5 of the 6 scheduled infusions prior to discontinuing treatment due to poor tolerability with significant anemia requiring blood transfusion. He has continued in routine follow up with Dr. [name] since that time and has continued ADT every 3 months, due for his next injection in 05/2023.  Unfortunately, the PSA has continued to rise but at a slower rate as compared to prior to [place].  His most recent PSA on 03/18/2023 was 645, increased from 624 in June 2024. He has had significant, progressive neck pain for several weeks now, no longer well-controlled with pain medications.  He was started on prednisone 30 mg p.o. daily and Cymbalta and  an MRI of the cervical spine was ordered and scheduled for 04/15/2023.  On 04/14/2023, he developed acute urinary retention requiring Foley catheter placement and developed shooting pains down the left arm with associated numbness/tingling so he presented to the emergency department to rule out  cardiac issue.  Fortunately, this did not appear to be related to ischemia or acute cardiac disease so he was discharged and advised to proceed with the scheduled MRI of the cervical spine which was completed on 04/15/2023.  This study showed diffuse metastases with epidural extension of tumor T2-3 through C6-7 and multilevel severe neuroforaminal narrowing at C4-5 and on the left at C5-6, mostly secondary to degenerative changes and tumor involvement, per radiologist.  His pain did improve significantly with the prednisone and Cymbalta so he was recently changed to dexamethasone 4 mg p.o. twice daily which has continued to give good pain relief.  His Foley catheter was able to be removed in the emergency department on 04/15/2023 and he has continued to void without difficulty, on his own.  He has been kindly referred today to discuss the potential role for palliative radiotherapy to the painful metastatic disease in the cervical spine.  Dr. [name] is currently determining whether he is a candidate for PARP inhibitor therapy since he is adamantly not interested in proceeding with chemotherapy.  PREVIOUS RADIATION THERAPY: Yes  09/29/22 - 02/23/23: Xofigo infusions- patient completed 5 of 6 planned infusions but discontinued treatment due to poor tolerability/anemia  2015: Definitive prostate radotherapy ([place], Dr. Corky Downs)  PAST MEDICAL HISTORY:  Past Medical History:  Diagnosis Date   Arthritis    Blood in stool    Childhood asthma    Diabetes mellitus without complication (HCC)    type II   Heart disease    Hypertension, essential    Prostate cancer (HCC)    Stroke (HCC)       PAST SURGICAL HISTORY: Past Surgical History:  Procedure Laterality Date   CORONARY ANGIOPLASTY WITH STENT PLACEMENT     GANGLION CYST EXCISION Right    PROSTATE BIOPSY      FAMILY HISTORY:  Family History  Problem Relation Age of Onset   Hyperlipidemia Mother    Hypertension Mother    Stroke Father     Hyperlipidemia Sister    Depression Brother    Early death Brother    Heart disease Brother    Mental illness Brother    Prostate cancer Neg Hx    Breast cancer Neg Hx    Colon cancer Neg Hx    Pancreatic cancer Neg Hx     SOCIAL HISTORY:  Social History   Socioeconomic History   Marital status: Married    Spouse name: Talbert Forest   Number of children: 3   Years of education: Not on file   Highest education level: Not on file  Occupational History    Comment: retired  Tobacco Use   Smoking status: Never   Smokeless tobacco: Never   Tobacco comments:    as a Youth worker   Vaping status: Never Used  Substance and Sexual Activity   Alcohol use: Not Currently   Drug use: Never   Sexual activity: Yes    Partners: Female  Other Topics Concern   Not on file  Social History Narrative   Not on file   Social Determinants of Health   Financial Resource Strain: Low Risk  (12/02/2022)   Overall Financial Resource Strain (CARDIA)    Difficulty of Paying Living  Expenses: Not hard at all  Food Insecurity: No Food Insecurity (04/19/2023)   Hunger Vital Sign    Worried About Running Out of Food in the Last Year: Never true    Ran Out of Food in the Last Year: Never true  Transportation Needs: No Transportation Needs (04/19/2023)   PRAPARE - Administrator, Civil Service (Medical): No    Lack of Transportation (Non-Medical): No  Physical Activity: Insufficiently Active (06/03/2022)   Exercise Vital Sign    Days of Exercise per Week: 3 days    Minutes of Exercise per Session: 30 min  Stress: No Stress Concern Present (06/03/2022)   [employer] of Occupational Health - Occupational Stress Questionnaire    Feeling of Stress : Not at all  Social Connections: Moderately Integrated (06/03/2022)   Social Connection and Isolation Panel [NHANES]    Frequency of Communication with Friends and Family: More than three times a week    Frequency of Social Gatherings with  Friends and Family: More than three times a week    Attends Religious Services: More than 4 times per year    Active Member of [employer] or Organizations: No    Attends Banker Meetings: Never    Marital Status: Married  Catering manager Violence: Not At Risk (04/19/2023)   Humiliation, Afraid, Rape, and Kick questionnaire    Fear of Current or Ex-Partner: No    Emotionally Abused: No    Physically Abused: No    Sexually Abused: No    ALLERGIES: Tramadol, Glipizide, and Statins  MEDICATIONS:  Current Outpatient Medications  Medication Sig Dispense Refill   Alcohol Swabs (B-D SINGLE USE SWABS REGULAR) PADS      Blood Glucose Calibration (ACCU-CHEK AVIVA) SOLN      blood glucose meter kit and supplies Use once a day in the AM for fasting sugars. 1 each 0   calcium-vitamin D (OSCAL WITH D) 500-200 MG-UNIT tablet Take 1 tablet by mouth 2 (two) times daily. 60 tablet 3   Cholecalciferol (VITAMIN D3) 50 MCG (2000 UT) TABS Take by mouth.     clopidogrel (PLAVIX) 75 MG tablet TAKE 1 TABLET EVERY DAY 90 tablet 3   dexamethasone (DECADRON) 4 MG tablet Take 1 tablet (4 mg total) by mouth 2 (two) times daily. 20 tablet 0   Dulaglutide (TRULICITY) 0.75 MG/0.5ML SOPN Inject 0.75 mg into the skin once a week. 4 pen 0   DULoxetine (CYMBALTA) 30 MG capsule Take 1 capsule (30 mg total) by mouth daily. 30 capsule 0   Ez Smart Blood Glucose Lancets MISC by Does not apply route. 100 each 3   glucose blood test strip Use as instructed 100 each 3   hydrochlorothiazide (HYDRODIURIL) 25 MG tablet Take 1 tablet (25 mg total) by mouth daily. 90 tablet 3   hydrOXYzine (ATARAX) 10 MG tablet TAKE 1 TABLET BY MOUTH 3 TIMES DAILY AS NEEDED FOR ITCHING. 270 tablet 1   isosorbide mononitrate (IMDUR) 30 MG 24 hr tablet Take 1 tablet (30 mg total) by mouth daily. 90 tablet 3   Multiple Vitamin (MULTI-DAY VITAMINS PO) Take by mouth daily at 6 (six) AM.     NIFEdipine (PROCARDIA XL/NIFEDICAL-XL) 90 MG 24 hr  tablet Take 1 tablet (90 mg total) by mouth daily. 90 tablet 3   nitroGLYCERIN (NITROSTAT) 0.4 MG SL tablet Place 1 tablet (0.4 mg total) under the tongue every 5 (five) minutes as needed for chest pain. 25 tablet 3   oxyCODONE (OXY  IR/ROXICODONE) 5 MG immediate release tablet Take 1 tablet (5 mg total) by mouth every 6 (six) hours as needed for severe pain. 45 tablet 0   polyethylene glycol (MIRALAX / GLYCOLAX) 17 g packet Take 17 g by mouth as needed for moderate constipation.     potassium chloride SA (KLOR-CON M) 20 MEQ tablet Take 1 tablet (20 mEq total) by mouth 2 (two) times daily. 180 tablet 3   predniSONE (DELTASONE) 10 MG tablet Take 1 tablet (10 mg total) by mouth daily with breakfast. 12 tablet 0   rosuvastatin (CRESTOR) 5 MG tablet TAKE 1 TABLET EVERY DAY 90 tablet 3   vitamin B-12 (CYANOCOBALAMIN) 1000 MCG tablet Take 1,000 mcg by mouth daily.     No current facility-administered medications for this encounter.    REVIEW OF SYSTEMS:  On review of systems, the patient reports that he is doing well overall. He denies any chest pain, shortness of breath, cough, fevers, chills, night sweats, or unintended weight changes. He developed acute urinary retention requiring Foley catheter placement for 24 hours but was able to have the Foley catheter removed in the emergency department on 04/15/2023 and has been able to void without difficulty since that time.  He denies abdominal pain, nausea or vomiting. He has continued with significant fatigue associated with his recent Xofigo infusions and ADT. He currently denies numbness or tingling and no radiation of pain into the upper or lower extremities.  The severe neck pain that he was experiencing has significantly improved since starting steroids and he currently reports that he is comfortable and his pain is well-managed. A complete review of systems is obtained and is otherwise negative.   PHYSICAL EXAM:  Wt Readings from Last 3 Encounters:   04/19/23 149 lb 6.4 oz (67.8 kg)  04/15/23 154 lb 12.2 oz (70.2 kg)  04/14/23 154 lb 11.2 oz (70.2 kg)   Temp Readings from Last 3 Encounters:  04/19/23 (!) 97.5 F (36.4 C) (Temporal)  04/16/23 98 F (36.7 C) (Oral)  04/15/23 97.8 F (36.6 C) (Oral)   BP Readings from Last 3 Encounters:  04/19/23 139/79  04/16/23 (!) 154/89  04/15/23 127/68   Pulse Readings from Last 3 Encounters:  04/19/23 60  04/16/23 80  04/15/23 86   Pain Assessment Pain Score: 10-Worst pain ever Pain Loc: Neck (Cervical spine)/10  In general this is a well appearing Caucasian male in no acute distress. He's alert and oriented x4 and appropriate throughout the examination. Cardiopulmonary assessment is negative for acute distress and he exhibits normal effort.     KPS = 70  100 - Normal; no complaints; no evidence of disease. 90   - Able to carry on normal activity; minor signs or symptoms of disease. 80   - Normal activity with effort; some signs or symptoms of disease. 38   - Cares for self; unable to carry on normal activity or to do active work. 60   - Requires occasional assistance, but is able to care for most of his personal needs. 50   - Requires considerable assistance and frequent medical care. 40   - Disabled; requires special care and assistance. 30   - Severely disabled; hospital admission is indicated although death not imminent. 20   - Very sick; hospital admission necessary; active supportive treatment necessary. 10   - Moribund; fatal processes progressing rapidly. 0     - Dead  Karnofsky DA, Abelmann WH, Craver LS and Burchenal [facility] [phone]) The use of the  nitrogen mustards in the palliative treatment of carcinoma: with particular reference to bronchogenic carcinoma Cancer 1 634-56  LABORATORY DATA:  Lab Results  Component Value Date   WBC 7.0 04/16/2023   HGB 9.5 (L) 04/16/2023   HCT 28.1 (L) 04/16/2023   MCV 92.7 04/16/2023   PLT 213 04/16/2023   Lab Results  Component  Value Date   NA 135 04/16/2023   K 3.8 04/16/2023   CL 96 (L) 04/16/2023   CO2 27 04/16/2023   Lab Results  Component Value Date   ALT 10 03/18/2023   AST 21 03/18/2023   ALKPHOS 104 03/18/2023   BILITOT 0.3 03/18/2023     RADIOGRAPHY: MR Cervical Spine Wo Contrast  Result Date: 04/15/2023 CLINICAL DATA:  Metastatic disease evaluation. EXAM: MRI CERVICAL SPINE WITHOUT CONTRAST TECHNIQUE: Multiplanar, multisequence MR imaging of the cervical spine was performed. No intravenous contrast was administered. COMPARISON:  Head CT 08/09/2022.  CT cervical spine 07/31/2019. FINDINGS: Alignment: 4 mm anterolisthesis of C4 on C5. Vertebrae: Diffusely low marrow signal, consistent with known metastatic prostate cancer. Dorsal epidural extension of tumor from C2-3 through at least C6-7. Cord: Normal spinal cord signal and volume. Posterior Fossa, vertebral arteries, paraspinal tissues: Dorsal paraspinal extension of tumor along the C4 spinous process. Disc levels: C2-C3: Disc bulge, ligamentum flavum buckling and epidural tumor results in moderate spinal canal stenosis. Facet arthropathy and uncovertebral joint spurring results in moderate bilateral neural foraminal narrowing. C3-C4: Dorsal epidural tumor results in moderate spinal canal stenosis. Facet arthropathy and uncovertebral joint spurring contribute to moderate bilateral neural foraminal narrowing. C4-C5: Disc bulge, ligamentum flavum buckling and dorsal epidural tumor results in moderate-to-severe spinal canal stenosis. Facet arthropathy and uncovertebral joint spurring contribute to severe bilateral neural foraminal narrowing. C5-C6: Disc-osteophyte complex and dorsal epidural tumor results in mild-to-moderate spinal canal stenosis. Facet arthropathy and uncovertebral joint spurring results in severe left and moderate right neural foraminal narrowing. C6-C7: Disc bulge, ligamentum flavum buckling and dorsal epidural tumor results in severe spinal canal  stenosis. Uncovertebral joint spurring contributes to moderate left neural foraminal narrowing. C7-T1: Moderate bilateral facet arthropathy. No spinal canal stenosis or neural foraminal narrowing. IMPRESSION: 1. Diffusely low marrow signal, consistent with known metastatic prostate cancer. Dorsal epidural extension of tumor from C2-3 through at least C6-7, with associated multilevel spinal canal stenosis, severe at C6-7 and moderate-to-severe at C4-5. Postcontrast imaging would better delineate the extent of epidural tumor. 2. Multilevel neural foraminal narrowing, severe bilaterally at C4-5 and on the left at C5-6. Electronically Signed   By: Orvan Falconer M.D.   On: 04/15/2023 17:07      IMPRESSION/PLAN: 1. 82 y.o. male with castrate-resistant prostate cancer with osseous metastasis.  Dr. Kathrynn Running and I have personally reviewed his recent MRI cervical spine imaging.  Today, I talked to the patient and and his wife about the findings and workup thus far. We reviewed the natural history of prostate cancer and general treatment, highlighting the role of radiotherapy in the management of painful metastatic disease. We discussed the available radiation techniques, and focused on the details and logistics of delivery.  We reviewed the anticipated acute and late sequelae associated with radiation in this setting.  The recommendation is for a 2-week course of daily external beam radiation to the painful metastatic disease in the cervical spine.  He appears to have a good understanding of his disease and our treatment recommendations which are of palliative intent.  The patient was encouraged to ask questions that were answered to  his stated satisfaction.   At the end of our conversation, the patient is interested in proceeding with the recommended 2-week course of daily external beam radiation to the painful metastatic disease in the cervical spine.  He has freely signed written consent to proceed today in the  office and a copy of this document will be placed in his medical record.  We will proceed with CT simulation/treatment planning following our visit today, in anticipation of beginning his daily treatments in the very near future.  We will share our discussion with Dr. [name] and move forward with treatment planning accordingly.  I enjoyed meeting with him and his wife again today and look forward to continuing to participate in his care.  I personally spent 45 minutes in this encounter including chart review, reviewing radiological studies, meeting face-to-face with the patient, entering orders and completing documentation.    Marguarite Arbour, PA-C    Margaretmary Dys, MD  [facility]  Radiation Oncology Direct Dial: [phone]  Fax: [phone] [email or web address]  Skype  LinkedIn

## 2023-04-19 NOTE — Progress Notes (Signed)
Nursing interview for Malignant neoplasm of prostate metastatic to bone [facility]). Patient identity verified x2.  Patient reports occasional neck pain 10/10. No other issues conveyed at this time.  Meaningful use complete.  Vitals- BP 139/79 (BP Location: Left Arm, Patient Position: Sitting, Cuff Size: Normal)   Pulse 60   Temp (!) 97.5 F (36.4 C) (Temporal)   Resp 20   Ht 5\' 10"  (1.778 m)   Wt 149 lb 6.4 oz (67.8 kg)   SpO2 97%   BMI 21.44 kg/m   This concludes the interaction.  Ruel Favors, LPN

## 2023-04-20 ENCOUNTER — Telehealth: Payer: Self-pay | Admitting: Hematology

## 2023-04-20 ENCOUNTER — Other Ambulatory Visit: Payer: Self-pay

## 2023-04-20 ENCOUNTER — Encounter: Payer: Self-pay | Admitting: Hematology

## 2023-04-20 ENCOUNTER — Ambulatory Visit
Admission: RE | Admit: 2023-04-20 | Discharge: 2023-04-20 | Disposition: A | Discharge status: Home / Self Care | Payer: Medicare HMO | Source: Ambulatory Visit | Attending: Radiation Oncology | Admitting: Radiation Oncology

## 2023-04-20 ENCOUNTER — Inpatient Hospital Stay: Payer: Medicare HMO | Admitting: Licensed Clinical Social Worker

## 2023-04-20 DIAGNOSIS — Z191 Hormone sensitive malignancy status: Secondary | ICD-10-CM | POA: Diagnosis not present

## 2023-04-20 DIAGNOSIS — C7951 Secondary malignant neoplasm of bone: Secondary | ICD-10-CM | POA: Diagnosis not present

## 2023-04-20 DIAGNOSIS — C61 Malignant neoplasm of prostate: Secondary | ICD-10-CM | POA: Diagnosis not present

## 2023-04-20 DIAGNOSIS — Z510 Encounter for antineoplastic radiation therapy: Secondary | ICD-10-CM | POA: Diagnosis not present

## 2023-04-20 LAB — RAD ONC ARIA SESSION SUMMARY
Course Elapsed Days: 0
Plan Fractions Treated to Date: 1
Plan Prescribed Dose Per Fraction: 3 Gy
Plan Total Fractions Prescribed: 10
Plan Total Prescribed Dose: 30 Gy
Reference Point Dosage Given to Date: 3 Gy
Reference Point Session Dosage Given: 3 Gy
Session Number: 1

## 2023-04-20 NOTE — Progress Notes (Signed)
Patients's spouse called referred by social work to apply for [employer].  Discussed one-time $1000 Marketing executive to assist with personal expenses while going through treatment. Advised what is needed to apply. She will bring today after Radiation appointment to complete process. They will be given grant paperwork to complete and receive a copy and green folder with expense sheet to review and call me to discuss sheet in detail. Explained gas cards and how they are obtained.  She has my contact name and number and also will receive my card to call to discuss and for any additional financial questions or concerns.

## 2023-04-20 NOTE — Progress Notes (Unsigned)
Palliative Medicine [facility]  Telephone:(336) [phone] Fax:(336) [phone]   Name: Wayne Perkins Date: 04/20/2023 MRN: [id number]  DOB: 1941-06-02  Patient Care Team: Sheliah Hatch, MD as PCP - General (Family Medicine) Jodelle Red, MD as PCP - Cardiology (Cardiology) Cindie Laroche, MD as Referring Physician (Urology) Felicita Gage, RN Nurse Navigator as Registered Nurse (Medical Oncology) Malachy Mood, MD as Consulting Physician (Hematology and Oncology)    REASON FOR CONSULTATION: Wayne Perkins is a 82 y.o. adult with oncologic medical history including malignant neoplasm of prostate (07/2019) with metastatic disease to bone, as well as HLD, hypertension, CAD, CKD stage 3, type 2 diabetes, anemia, and a TIA in 2020.  Palliative ask to see for symptom management and goals of care.    SOCIAL HISTORY:     reports that he has never smoked. He has never used smokeless tobacco. He reports that he does not currently use alcohol. He reports that he does not use drugs.  ADVANCE DIRECTIVES:  None on file  CODE STATUS: Full code  PAST MEDICAL HISTORY: Past Medical History:  Diagnosis Date   Arthritis    Blood in stool    Childhood asthma    Diabetes mellitus without complication (HCC)    type II   Heart disease    Hypertension, essential    Prostate cancer (HCC)    Stroke (HCC)     PAST SURGICAL HISTORY:  Past Surgical History:  Procedure Laterality Date   CORONARY ANGIOPLASTY WITH STENT PLACEMENT     GANGLION CYST EXCISION Right    PROSTATE BIOPSY      HEMATOLOGY/ONCOLOGY HISTORY:  Oncology History Overview Note   Cancer Staging  Malignant neoplasm of prostate metastatic to bone [facility]) Staging form: Prostate, AJCC 8th Edition - Clinical: Stage IVB (cTX, cNX, pM1b, Grade Group: 3) - Signed by Benjiman Core, MD on 02/17/2021 Gleason score: 7 Histologic grading system: 5 grade system     Malignant neoplasm of prostate metastatic to  bone (HCC)  07/31/2019 Initial Diagnosis   Malignant neoplasm of prostate metastatic to bone (HCC)   02/17/2021 Cancer Staging   Staging form: Prostate, AJCC 8th Edition - Clinical: Stage IVB (cTX, cNX, pM1b, Grade Group: 3) - Signed by Benjiman Core, MD on 02/17/2021 Gleason score: 7 Histologic grading system: 5 grade system   Prostate cancer (HCC)  02/19/2020 Initial Diagnosis   Prostate cancer (HCC)     ALLERGIES:  is allergic to tramadol, glipizide, and statins.  MEDICATIONS:  Current Outpatient Medications  Medication Sig Dispense Refill   Alcohol Swabs (B-D SINGLE USE SWABS REGULAR) PADS      Blood Glucose Calibration (ACCU-CHEK AVIVA) SOLN      blood glucose meter kit and supplies Use once a day in the AM for fasting sugars. 1 each 0   calcium-vitamin D (OSCAL WITH D) 500-200 MG-UNIT tablet Take 1 tablet by mouth 2 (two) times daily. 60 tablet 3   Cholecalciferol (VITAMIN D3) 50 MCG (2000 UT) TABS Take by mouth.     clopidogrel (PLAVIX) 75 MG tablet TAKE 1 TABLET EVERY DAY 90 tablet 3   dexamethasone (DECADRON) 4 MG tablet Take 1 tablet (4 mg total) by mouth 2 (two) times daily. 20 tablet 0   Dulaglutide (TRULICITY) 0.75 MG/0.5ML SOPN Inject 0.75 mg into the skin once a week. 4 pen 0   DULoxetine (CYMBALTA) 30 MG capsule Take 1 capsule (30 mg total) by mouth daily. 30 capsule 0   Ez  Smart Blood Glucose Lancets MISC by Does not apply route. 100 each 3   glucose blood test strip Use as instructed 100 each 3   hydrochlorothiazide (HYDRODIURIL) 25 MG tablet Take 1 tablet (25 mg total) by mouth daily. 90 tablet 3   hydrOXYzine (ATARAX) 10 MG tablet TAKE 1 TABLET BY MOUTH 3 TIMES DAILY AS NEEDED FOR ITCHING. 270 tablet 1   isosorbide mononitrate (IMDUR) 30 MG 24 hr tablet Take 1 tablet (30 mg total) by mouth daily. 90 tablet 3   Multiple Vitamin (MULTI-DAY VITAMINS PO) Take by mouth daily at 6 (six) AM.     NIFEdipine (PROCARDIA XL/NIFEDICAL-XL) 90 MG 24 hr tablet Take 1 tablet (90  mg total) by mouth daily. 90 tablet 3   nitroGLYCERIN (NITROSTAT) 0.4 MG SL tablet Place 1 tablet (0.4 mg total) under the tongue every 5 (five) minutes as needed for chest pain. 25 tablet 3   oxyCODONE (OXY IR/ROXICODONE) 5 MG immediate release tablet Take 1 tablet (5 mg total) by mouth every 6 (six) hours as needed for severe pain. 45 tablet 0   polyethylene glycol (MIRALAX / GLYCOLAX) 17 g packet Take 17 g by mouth as needed for moderate constipation.     potassium chloride SA (KLOR-CON M) 20 MEQ tablet Take 1 tablet (20 mEq total) by mouth 2 (two) times daily. 180 tablet 3   predniSONE (DELTASONE) 10 MG tablet Take 1 tablet (10 mg total) by mouth daily with breakfast. 12 tablet 0   rosuvastatin (CRESTOR) 5 MG tablet TAKE 1 TABLET EVERY DAY 90 tablet 3   vitamin B-12 (CYANOCOBALAMIN) 1000 MCG tablet Take 1,000 mcg by mouth daily.     No current facility-administered medications for this visit.    VITAL SIGNS: There were no vitals taken for this visit. There were no vitals filed for this visit.  Estimated body mass index is 21.44 kg/m as calculated from the following:   Height as of 04/19/23: 5\' 10"  (1.778 m).   Weight as of 04/19/23: 149 lb 6.4 oz (67.8 kg).  LABS: CBC:    Component Value Date/Time   WBC 7.0 04/16/2023 1104   HGB 9.5 (L) 04/16/2023 1104   HGB 10.0 (L) 04/15/2023 1450   HCT 28.1 (L) 04/16/2023 1104   PLT 213 04/16/2023 1104   PLT 219 04/15/2023 1450   MCV 92.7 04/16/2023 1104   NEUTROABS 6.0 04/16/2023 1104   LYMPHSABS 0.4 (L) 04/16/2023 1104   MONOABS 0.6 04/16/2023 1104   EOSABS 0.0 04/16/2023 1104   BASOSABS 0.0 04/16/2023 1104   Comprehensive Metabolic Panel:    Component Value Date/Time   NA 135 04/16/2023 1104   K 3.8 04/16/2023 1104   CL 96 (L) 04/16/2023 1104   CO2 27 04/16/2023 1104   BUN 51 (H) 04/16/2023 1104   CREATININE 1.41 (H) 04/16/2023 1104   CREATININE 1.47 (H) 03/18/2023 1437   CREATININE 1.66 (H) 08/15/2020 1123   GLUCOSE 266 (H)  04/16/2023 1104   CALCIUM 9.0 04/16/2023 1104   AST 21 03/18/2023 1437   ALT 10 03/18/2023 1437   ALKPHOS 104 03/18/2023 1437   BILITOT 0.3 03/18/2023 1437   PROT 6.5 03/18/2023 1437   ALBUMIN 4.0 03/18/2023 1437    RADIOGRAPHIC STUDIES: MR Cervical Spine Wo Contrast  Result Date: 04/15/2023 CLINICAL DATA:  Metastatic disease evaluation. EXAM: MRI CERVICAL SPINE WITHOUT CONTRAST TECHNIQUE: Multiplanar, multisequence MR imaging of the cervical spine was performed. No intravenous contrast was administered. COMPARISON:  Head CT 08/09/2022.  CT cervical  spine 07/31/2019. FINDINGS: Alignment: 4 mm anterolisthesis of C4 on C5. Vertebrae: Diffusely low marrow signal, consistent with known metastatic prostate cancer. Dorsal epidural extension of tumor from C2-3 through at least C6-7. Cord: Normal spinal cord signal and volume. Posterior Fossa, vertebral arteries, paraspinal tissues: Dorsal paraspinal extension of tumor along the C4 spinous process. Disc levels: C2-C3: Disc bulge, ligamentum flavum buckling and epidural tumor results in moderate spinal canal stenosis. Facet arthropathy and uncovertebral joint spurring results in moderate bilateral neural foraminal narrowing. C3-C4: Dorsal epidural tumor results in moderate spinal canal stenosis. Facet arthropathy and uncovertebral joint spurring contribute to moderate bilateral neural foraminal narrowing. C4-C5: Disc bulge, ligamentum flavum buckling and dorsal epidural tumor results in moderate-to-severe spinal canal stenosis. Facet arthropathy and uncovertebral joint spurring contribute to severe bilateral neural foraminal narrowing. C5-C6: Disc-osteophyte complex and dorsal epidural tumor results in mild-to-moderate spinal canal stenosis. Facet arthropathy and uncovertebral joint spurring results in severe left and moderate right neural foraminal narrowing. C6-C7: Disc bulge, ligamentum flavum buckling and dorsal epidural tumor results in severe spinal canal  stenosis. Uncovertebral joint spurring contributes to moderate left neural foraminal narrowing. C7-T1: Moderate bilateral facet arthropathy. No spinal canal stenosis or neural foraminal narrowing. IMPRESSION: 1. Diffusely low marrow signal, consistent with known metastatic prostate cancer. Dorsal epidural extension of tumor from C2-3 through at least C6-7, with associated multilevel spinal canal stenosis, severe at C6-7 and moderate-to-severe at C4-5. Postcontrast imaging would better delineate the extent of epidural tumor. 2. Multilevel neural foraminal narrowing, severe bilaterally at C4-5 and on the left at C5-6. Electronically Signed   By: Orvan Falconer M.D.   On: 04/15/2023 17:07    PERFORMANCE STATUS (ECOG) : {CHL ONC ECOG ZO:[id number]}  Review of Systems Unless otherwise noted, a complete review of systems is negative.  Physical Exam General: NAD Cardiovascular: regular rate and rhythm Pulmonary: clear ant fields Abdomen: soft, nontender, + bowel sounds Extremities: no edema, no joint deformities Skin: no rashes Neurological:  IMPRESSION: *** I introduced myself, Maygan RN, and Palliative's role in collaboration with the oncology team. Concept of Palliative Care was introduced as specialized medical care for people and their families living with serious illness.  It focuses on providing relief from the symptoms and stress of a serious illness.  The goal is to improve quality of life for both the patient and the family. Values and goals of care important to patient and family were attempted to be elicited.    We discussed *** current illness and what it means in the larger context of *** on-going co-morbidities. Natural disease trajectory and expectations were discussed.  I discussed the importance of continued conversation with family and their medical providers regarding overall plan of care and treatment options, ensuring decisions are within the context of the patients values and  GOCs.  PLAN: Established therapeutic relationship. Education provided on palliative's role in collaboration with their Oncology/Radiation team. I will plan to see patient back in 2-4 weeks in collaboration to other oncology appointments.    Patient expressed understanding and was in agreement with this plan. He also understands that He can call the clinic at any time with any questions, concerns, or complaints.   Thank you for your referral and allowing Palliative to assist in [name] care.   Number and complexity of problems addressed: ***HIGH - 1 or more chronic illnesses with SEVERE exacerbation, progression, or side effects of treatment - advanced cancer, pain. Any controlled substances utilized were prescribed in the context of palliative  care.   Visit consisted of counseling and education dealing with the complex and emotionally intense issues of symptom management and palliative care in the setting of serious and potentially life-threatening illness.Greater than 50%  of this time was spent counseling and coordinating care related to the above assessment and plan.  Signed by: Willette Alma, AGPCNP-BC Palliative Medicine Team/[facility]   *Please note that this is a verbal dictation therefore any spelling or grammatical errors are due to the "Dragon Medical One" system interpretation.

## 2023-04-20 NOTE — Progress Notes (Signed)
Received call from patient's spouse regarding expense sheet. Went over expenses and how they are covered. She was hesitant about using grant. Encouraged patient was approved and grant is available to anyone whom qualifies and needs. She verbalized understanding.  She has my card for any additional financial questions or concerns.

## 2023-04-20 NOTE — Progress Notes (Signed)
Patient provided documents for grants.  Patient approved for one-time $1000 [employer] grant to assist with personal expenses while going through treatment and one-time $200 Prostate(for medications and gas cards only). They received a copy of the approval letter and expense sheet in the green folder. They were advised to contact me at earliest convenience to discuss expense sheet in detail.  They have my card to do so and for any additional financial questions or concerns.

## 2023-04-20 NOTE — Progress Notes (Signed)
[facility] CSW Progress Note  Visual merchandiser  received a referral to contact pt regarding financial resources.  Pt has been in treatment for a number of years and is beginning a course of radiation.  CSW spoke w/ pt's wife.  Per wife pt has never applied for the [employer] throughout treatment.  CSW provided contact information for financial resource specialist to apply and discussed options w/ regards to setting up a payment plan for co-pays and possible [place] if appropriate.  Pt resides w/ his wife and their son is nearby.  Both pt and his wife are independent in ADLs and would like to retain independence as long as possible.        Rachel Moulds, LCSW Clinical Social Worker [facility]

## 2023-04-21 ENCOUNTER — Ambulatory Visit
Admission: RE | Admit: 2023-04-21 | Discharge: 2023-04-21 | Disposition: A | Discharge status: Home / Self Care | Payer: Medicare HMO | Source: Ambulatory Visit | Attending: Radiation Oncology | Admitting: Radiation Oncology

## 2023-04-21 ENCOUNTER — Other Ambulatory Visit: Payer: Self-pay

## 2023-04-21 ENCOUNTER — Encounter: Payer: Self-pay | Admitting: Nurse Practitioner

## 2023-04-21 ENCOUNTER — Other Ambulatory Visit (HOSPITAL_COMMUNITY): Payer: Self-pay

## 2023-04-21 ENCOUNTER — Inpatient Hospital Stay (HOSPITAL_BASED_OUTPATIENT_CLINIC_OR_DEPARTMENT_OTHER): Payer: Medicare HMO | Admitting: Nurse Practitioner

## 2023-04-21 VITALS — BP 148/76 | HR 82 | Temp 97.5°F | Resp 17 | Wt 151.6 lb

## 2023-04-21 DIAGNOSIS — K5900 Constipation, unspecified: Secondary | ICD-10-CM | POA: Diagnosis not present

## 2023-04-21 DIAGNOSIS — C61 Malignant neoplasm of prostate: Secondary | ICD-10-CM

## 2023-04-21 DIAGNOSIS — C7951 Secondary malignant neoplasm of bone: Secondary | ICD-10-CM

## 2023-04-21 DIAGNOSIS — G893 Neoplasm related pain (acute) (chronic): Secondary | ICD-10-CM | POA: Diagnosis not present

## 2023-04-21 DIAGNOSIS — Z7189 Other specified counseling: Secondary | ICD-10-CM | POA: Diagnosis not present

## 2023-04-21 DIAGNOSIS — Z191 Hormone sensitive malignancy status: Secondary | ICD-10-CM | POA: Diagnosis not present

## 2023-04-21 DIAGNOSIS — Z515 Encounter for palliative care: Secondary | ICD-10-CM

## 2023-04-21 DIAGNOSIS — Z510 Encounter for antineoplastic radiation therapy: Secondary | ICD-10-CM | POA: Diagnosis not present

## 2023-04-21 LAB — RAD ONC ARIA SESSION SUMMARY
Course Elapsed Days: 1
Plan Fractions Treated to Date: 2
Plan Prescribed Dose Per Fraction: 3 Gy
Plan Total Fractions Prescribed: 10
Plan Total Prescribed Dose: 30 Gy
Reference Point Dosage Given to Date: 6 Gy
Reference Point Session Dosage Given: 3 Gy
Session Number: 2

## 2023-04-21 MED ORDER — MAGNESIUM CITRATE PO SOLN
1.0000 | Freq: Once | ORAL | 0 refills | Status: AC
Start: 2023-04-21 — End: 2023-04-22
  Filled 2023-04-21: qty 296, 1d supply, fill #0

## 2023-04-21 MED ORDER — DEXAMETHASONE 2 MG PO TABS
2.0000 mg | ORAL_TABLET | Freq: Two times a day (BID) | ORAL | 0 refills | Status: DC
Start: 2023-04-21 — End: 2023-04-28
  Filled 2023-04-21: qty 12, 6d supply, fill #0

## 2023-04-21 NOTE — Patient Instructions (Addendum)
-   for constipation start taking Mirilax 2 times a day, if you do not have a bowel movement in 48 hours add 2 pills of Senakot daily at bedtime, if you still do not have a bowel movement 48 hours after that add 2 more Senakot pills in the morning, do not go more than 48 hours without a bowel movement  - if no bowel movement by Saturday get some Magnesium citrate (clear bottle over the counter) take 1/2 of the bottle if no bowel movement in 4 hours take the other half, this will cause you to go so stay home/near a bathroom  - if you take the magnesium citrate take a break on Sunday and re-start your Mirilax on Monday  - please call the office with any questions or concerns at [phone]

## 2023-04-22 ENCOUNTER — Other Ambulatory Visit: Payer: Self-pay

## 2023-04-22 ENCOUNTER — Ambulatory Visit: Admission: RE | Admit: 2023-04-22 | Payer: Medicare HMO | Source: Ambulatory Visit

## 2023-04-22 ENCOUNTER — Other Ambulatory Visit (HOSPITAL_COMMUNITY): Payer: Self-pay

## 2023-04-22 ENCOUNTER — Telehealth: Payer: Self-pay

## 2023-04-22 DIAGNOSIS — C7951 Secondary malignant neoplasm of bone: Secondary | ICD-10-CM | POA: Diagnosis not present

## 2023-04-22 DIAGNOSIS — Z510 Encounter for antineoplastic radiation therapy: Secondary | ICD-10-CM | POA: Diagnosis not present

## 2023-04-22 DIAGNOSIS — C61 Malignant neoplasm of prostate: Secondary | ICD-10-CM | POA: Diagnosis not present

## 2023-04-22 DIAGNOSIS — Z191 Hormone sensitive malignancy status: Secondary | ICD-10-CM | POA: Diagnosis not present

## 2023-04-22 LAB — RAD ONC ARIA SESSION SUMMARY
Course Elapsed Days: 2
Plan Fractions Treated to Date: 3
Plan Prescribed Dose Per Fraction: 3 Gy
Plan Total Fractions Prescribed: 10
Plan Total Prescribed Dose: 30 Gy
Reference Point Dosage Given to Date: 9 Gy
Reference Point Session Dosage Given: 3 Gy
Session Number: 3

## 2023-04-22 NOTE — Telephone Encounter (Signed)
Transition Care Management Unsuccessful Follow-up Telephone Call  Date of discharge and from where:  04/16/2023 [facility]  Attempts:  2nd Attempt  Reason for unsuccessful TCM follow-up call:  Left voice message  Jatavion Peaster [facility]  [facility] Population Health Community Resource Care Guide   [email or web address]  ?? [phone]   Website: [email or web address]  [email or web address]

## 2023-04-22 NOTE — Telephone Encounter (Signed)
Transition Care Management Unsuccessful Follow-up Telephone Call  Date of discharge and from where:  04/16/2023 [facility]  Attempts:  1st Attempt  Reason for unsuccessful TCM follow-up call:  Left voice message  Lurline Caver [facility]  [facility] Population Health Community Resource Care Guide   [email or web address]  ?? [phone]   Website: [email or web address]  [email or web address]

## 2023-04-23 ENCOUNTER — Other Ambulatory Visit: Payer: Self-pay

## 2023-04-23 ENCOUNTER — Emergency Department (HOSPITAL_BASED_OUTPATIENT_CLINIC_OR_DEPARTMENT_OTHER)
Admission: EM | Admit: 2023-04-23 | Discharge: 2023-04-23 | Disposition: A | Discharge status: Home / Self Care | Payer: Medicare HMO | Attending: Emergency Medicine | Admitting: Emergency Medicine

## 2023-04-23 ENCOUNTER — Encounter (HOSPITAL_BASED_OUTPATIENT_CLINIC_OR_DEPARTMENT_OTHER): Payer: Self-pay | Admitting: Emergency Medicine

## 2023-04-23 DIAGNOSIS — R339 Retention of urine, unspecified: Secondary | ICD-10-CM | POA: Insufficient documentation

## 2023-04-23 DIAGNOSIS — K5900 Constipation, unspecified: Secondary | ICD-10-CM

## 2023-04-23 DIAGNOSIS — Z7901 Long term (current) use of anticoagulants: Secondary | ICD-10-CM | POA: Diagnosis not present

## 2023-04-23 DIAGNOSIS — R338 Other retention of urine: Secondary | ICD-10-CM

## 2023-04-23 DIAGNOSIS — Z8546 Personal history of malignant neoplasm of prostate: Secondary | ICD-10-CM | POA: Diagnosis not present

## 2023-04-23 LAB — URINALYSIS, ROUTINE W REFLEX MICROSCOPIC
Bilirubin Urine: NEGATIVE
Glucose, UA: >1000 mg/dL — AB
Hgb urine dipstick: NEGATIVE
Ketones, ur: NEGATIVE mg/dL
Leukocytes,Ua: NEGATIVE
Nitrite: NEGATIVE
Specific Gravity, Urine: 1.018 (ref 1.005–1.030)
pH: 6.5 (ref 5.0–8.0)

## 2023-04-23 MED ORDER — GOLYTELY 236 G PO SOLR: 4000.0000 mL | Freq: Once | ORAL | 0 refills | Status: AC

## 2023-04-23 NOTE — ED Triage Notes (Addendum)
Pt in with reported urinary retention, hx of same. Pt states he has often had to come to the hospital for catheter placement, hx of Prostate CA and currently undergoing radiation trxs. +constipation as well, last BM 6 days ago. Had foley removed 8/17

## 2023-04-23 NOTE — ED Notes (Signed)
Bladder Scan:688-700 ml

## 2023-04-23 NOTE — Discharge Instructions (Signed)
Use the GoLytely as directed. Drink lots of fluids to remain hydrated. Follow up with your doctor as planned for consideration of urinary catheter removal when constipation is relieved.   Return here with any new or concerning symptoms at any time.

## 2023-04-23 NOTE — ED Provider Notes (Signed)
[place] EMERGENCY DEPARTMENT AT [facility] Provider Note   CSN: [id number] Arrival date & time: 04/23/23  1913     History  Chief Complaint  Patient presents with   Urinary Retention    Wayne Perkins is a 82 y.o. adult.  Patient to ED unable to pass urine since last night. History of urinary retention, last episode last week. Per wife, this occurs when he is constipated and has been told the increased stool causes urethral obstruction. History of prostate cancer. No fever, nausea or vomiting. He reports current constipation, trying stool softeners, Miralax, Senakot, and Fleets enema with only limited relief.   The history is provided by the patient and the spouse. No language interpreter was used.       Home Medications Prior to Admission medications   Medication Sig Start Date End Date Taking? Authorizing Provider  polyethylene glycol (GOLYTELY) 236 g solution Take 4,000 mLs by mouth once for 1 dose. 04/23/23 04/23/23 Yes Elpidio Anis, PA-C  Alcohol Swabs (B-D SINGLE USE SWABS REGULAR) PADS  11/05/19   [provider]  Blood Glucose Calibration (ACCU-CHEK AVIVA) SOLN  11/05/19   [provider]  blood glucose meter kit and supplies Use once a day in the AM for fasting sugars. 11/02/19   Orland Mustard, MD  calcium-vitamin D (OSCAL WITH D) 500-200 MG-UNIT tablet Take 1 tablet by mouth 2 (two) times daily. 09/04/20   Benjiman Core, MD  Cholecalciferol (VITAMIN D3) 50 MCG (2000 UT) TABS Take by mouth.    [provider]  clopidogrel (PLAVIX) 75 MG tablet TAKE 1 TABLET EVERY DAY 08/16/22   Sheliah Hatch, MD  dexamethasone (DECADRON) 2 MG tablet Take 1 tablet (2 mg total) by mouth 2 (two) times daily. 04/21/23   Pickenpack-Cousar, Arty Baumgartner, NP  dexamethasone (DECADRON) 4 MG tablet Take 1 tablet (4 mg total) by mouth 2 (two) times daily. 04/15/23   Malachy Mood, MD  Dulaglutide (TRULICITY) 0.75 MG/0.5ML SOPN Inject 0.75 mg into the skin once a week.  12/31/19   Orland Mustard, MD  DULoxetine (CYMBALTA) 30 MG capsule Take 1 capsule (30 mg total) by mouth daily. 04/14/23   Malachy Mood, MD  Ez Smart Blood Glucose Lancets MISC by Does not apply route. 10/24/19   Orland Mustard, MD  glucose blood test strip Use as instructed 10/24/19   Orland Mustard, MD  hydrochlorothiazide (HYDRODIURIL) 25 MG tablet Take 1 tablet (25 mg total) by mouth daily. 03/17/23   Sheliah Hatch, MD  hydrOXYzine (ATARAX) 10 MG tablet TAKE 1 TABLET BY MOUTH 3 TIMES DAILY AS NEEDED FOR ITCHING. 03/17/23   Sheliah Hatch, MD  isosorbide mononitrate (IMDUR) 30 MG 24 hr tablet Take 1 tablet (30 mg total) by mouth daily. 08/17/22 08/12/23  Alver Sorrow, NP  Multiple Vitamin (MULTI-DAY VITAMINS PO) Take by mouth daily at 6 (six) AM.    [provider]  NIFEdipine (PROCARDIA XL/NIFEDICAL-XL) 90 MG 24 hr tablet Take 1 tablet (90 mg total) by mouth daily. 03/17/23   Sheliah Hatch, MD  nitroGLYCERIN (NITROSTAT) 0.4 MG SL tablet Place 1 tablet (0.4 mg total) under the tongue every 5 (five) minutes as needed for chest pain. 08/09/22 11/07/22  Alver Sorrow, NP  oxyCODONE (OXY IR/ROXICODONE) 5 MG immediate release tablet Take 1 tablet (5 mg total) by mouth every 6 (six) hours as needed for severe pain. 03/11/23   Georga Kaufmann T, PA-C  polyethylene glycol (MIRALAX / GLYCOLAX) 17 g packet Take  17 g by mouth as needed for moderate constipation.    [provider]  potassium chloride SA (KLOR-CON M) 20 MEQ tablet Take 1 tablet (20 mEq total) by mouth 2 (two) times daily. 03/17/23   Sheliah Hatch, MD  rosuvastatin (CRESTOR) 5 MG tablet TAKE 1 TABLET EVERY DAY 12/22/22   Jodelle Red, MD  vitamin B-12 (CYANOCOBALAMIN) 1000 MCG tablet Take 1,000 mcg by mouth daily.    [provider]      Allergies    Tramadol, Glipizide, and Statins    Review of Systems   Review of Systems  Physical Exam Updated Vital Signs BP 135/69 (BP Location:  Right Arm)   Pulse 100   Temp 98.2 F (36.8 C)   Resp 18   Wt 68.8 kg   SpO2 100%   BMI 21.75 kg/m  Physical Exam Vitals and nursing note reviewed.  Constitutional:      Comments: Uncomfortable appearing.  Pulmonary:     Effort: Pulmonary effort is normal.  Abdominal:     General: There is distension.     Tenderness: There is abdominal tenderness.  Musculoskeletal:        General: Normal range of motion.  Skin:    General: Skin is warm and dry.  Neurological:     Mental Status: He is alert.     ED Results / Procedures / Treatments   Labs (all labs ordered are listed, but only abnormal results are displayed) Labs Reviewed  URINALYSIS, ROUTINE W REFLEX MICROSCOPIC - Abnormal; Notable for the following components:      Result Value   Glucose, UA >1,000 (*)    Protein, ur TRACE (*)    Bacteria, UA RARE (*)    All other components within normal limits    EKG None  Radiology No results found.  Procedures Procedures    Medications Ordered in ED Medications - No data to display  ED Course/ Medical Decision Making/ A&P Clinical Course as of 04/23/23 2135  Sat Apr 23, 2023  2128 Urinary catheter inserted shortly after arrival with relief of abdominal discomfort. Urine negative for infection. Large glucose in known diabetic on prednisone therapy.  [SU]  2129 Discussed leaving the catheter inserted and patient prefers to go home with it in place. Discussed treatment of constipation with GoLytely to be used at home. Patient and wife agreeable. There have close follow up in place with his doctor as he receives regular radiation. Feel once his constipation is relieved, catheter removal can be considered.  [SU]    Clinical Course User Index [SU] Elpidio Anis, PA-C                                 Medical Decision Making Amount and/or Complexity of Data Reviewed Labs: ordered.           Final Clinical Impression(s) / ED Diagnoses Final diagnoses:  Acute  urinary retention  Constipation, unspecified constipation type    Rx / DC Orders ED Discharge Orders          Ordered    polyethylene glycol (GOLYTELY) 236 g solution   Once        04/23/23 2133              Elpidio Anis, PA-C 04/23/23 2135    Lorre Nick, MD 04/24/23 1742

## 2023-04-23 NOTE — ED Notes (Signed)
Reviewed AVS with patient, patient expressed understanding of directions, denies further questions at this time. 

## 2023-04-25 ENCOUNTER — Telehealth: Payer: Self-pay

## 2023-04-25 ENCOUNTER — Other Ambulatory Visit: Payer: Self-pay

## 2023-04-25 ENCOUNTER — Telehealth: Payer: Self-pay | Admitting: *Deleted

## 2023-04-25 ENCOUNTER — Ambulatory Visit
Admission: RE | Admit: 2023-04-25 | Discharge: 2023-04-25 | Disposition: A | Discharge status: Home / Self Care | Payer: Medicare HMO | Source: Ambulatory Visit | Attending: Radiation Oncology | Admitting: Radiation Oncology

## 2023-04-25 DIAGNOSIS — Z510 Encounter for antineoplastic radiation therapy: Secondary | ICD-10-CM | POA: Diagnosis not present

## 2023-04-25 DIAGNOSIS — Z191 Hormone sensitive malignancy status: Secondary | ICD-10-CM | POA: Diagnosis not present

## 2023-04-25 DIAGNOSIS — C61 Malignant neoplasm of prostate: Secondary | ICD-10-CM | POA: Diagnosis not present

## 2023-04-25 DIAGNOSIS — C7951 Secondary malignant neoplasm of bone: Secondary | ICD-10-CM | POA: Diagnosis not present

## 2023-04-25 LAB — RAD ONC ARIA SESSION SUMMARY
Course Elapsed Days: 5
Plan Fractions Treated to Date: 4
Plan Prescribed Dose Per Fraction: 3 Gy
Plan Total Fractions Prescribed: 10
Plan Total Prescribed Dose: 30 Gy
Reference Point Dosage Given to Date: 12 Gy
Reference Point Session Dosage Given: 3 Gy
Session Number: 4

## 2023-04-25 NOTE — Progress Notes (Signed)
Patient has requested removal of his urinary catheter, stating, "He no longer needs it and feels he can urinate on his own." Patient is in Rad/Onc clinic today for assessment of his urinary catheter by Bryan Lemma PA-C.  Under the instruction of Bryan Lemma PA-C. Patient's Urinary catheter has been removed by Alba Cory. LPN  Patient tolerated the removal well and left under his own ambulation.  This concludes the interaction.  Ruel Favors, LPN

## 2023-04-25 NOTE — Telephone Encounter (Signed)
Pt called requesting catheter to be removed, pt and wife previously spoke with oncology team and confirmed that they will take over from here, pt and wife informed of this, no further questions or concerns at this time.

## 2023-04-26 ENCOUNTER — Ambulatory Visit
Admission: RE | Admit: 2023-04-26 | Discharge: 2023-04-26 | Disposition: A | Discharge status: Home / Self Care | Payer: Medicare HMO | Source: Ambulatory Visit | Attending: Radiation Oncology | Admitting: Radiation Oncology

## 2023-04-26 ENCOUNTER — Other Ambulatory Visit: Payer: Self-pay

## 2023-04-26 DIAGNOSIS — Z191 Hormone sensitive malignancy status: Secondary | ICD-10-CM | POA: Diagnosis not present

## 2023-04-26 DIAGNOSIS — C7951 Secondary malignant neoplasm of bone: Secondary | ICD-10-CM | POA: Diagnosis not present

## 2023-04-26 DIAGNOSIS — Z510 Encounter for antineoplastic radiation therapy: Secondary | ICD-10-CM | POA: Diagnosis not present

## 2023-04-26 DIAGNOSIS — C61 Malignant neoplasm of prostate: Secondary | ICD-10-CM | POA: Diagnosis not present

## 2023-04-26 LAB — RAD ONC ARIA SESSION SUMMARY
Course Elapsed Days: 6
Plan Fractions Treated to Date: 5
Plan Prescribed Dose Per Fraction: 3 Gy
Plan Total Fractions Prescribed: 10
Plan Total Prescribed Dose: 30 Gy
Reference Point Dosage Given to Date: 15 Gy
Reference Point Session Dosage Given: 3 Gy
Session Number: 5

## 2023-04-27 ENCOUNTER — Other Ambulatory Visit (HOSPITAL_COMMUNITY): Payer: Self-pay

## 2023-04-27 ENCOUNTER — Ambulatory Visit
Admission: RE | Admit: 2023-04-27 | Discharge: 2023-04-27 | Disposition: A | Discharge status: Home / Self Care | Payer: Medicare HMO | Source: Ambulatory Visit | Attending: Radiation Oncology | Admitting: Radiation Oncology

## 2023-04-27 ENCOUNTER — Other Ambulatory Visit: Payer: Self-pay

## 2023-04-27 DIAGNOSIS — Z191 Hormone sensitive malignancy status: Secondary | ICD-10-CM | POA: Diagnosis not present

## 2023-04-27 DIAGNOSIS — Z510 Encounter for antineoplastic radiation therapy: Secondary | ICD-10-CM | POA: Diagnosis not present

## 2023-04-27 DIAGNOSIS — C7951 Secondary malignant neoplasm of bone: Secondary | ICD-10-CM | POA: Diagnosis not present

## 2023-04-27 DIAGNOSIS — C61 Malignant neoplasm of prostate: Secondary | ICD-10-CM | POA: Diagnosis not present

## 2023-04-27 LAB — RAD ONC ARIA SESSION SUMMARY
Course Elapsed Days: 7
Plan Fractions Treated to Date: 6
Plan Prescribed Dose Per Fraction: 3 Gy
Plan Total Fractions Prescribed: 10
Plan Total Prescribed Dose: 30 Gy
Reference Point Dosage Given to Date: 18 Gy
Reference Point Session Dosage Given: 3 Gy
Session Number: 6

## 2023-04-28 ENCOUNTER — Other Ambulatory Visit: Payer: Self-pay | Admitting: Urology

## 2023-04-28 ENCOUNTER — Other Ambulatory Visit: Payer: Self-pay | Admitting: Hematology

## 2023-04-28 ENCOUNTER — Other Ambulatory Visit: Payer: Self-pay

## 2023-04-28 ENCOUNTER — Ambulatory Visit
Admission: RE | Admit: 2023-04-28 | Discharge: 2023-04-28 | Disposition: A | Discharge status: Home / Self Care | Payer: Medicare HMO | Source: Ambulatory Visit | Attending: Radiation Oncology | Admitting: Radiation Oncology

## 2023-04-28 ENCOUNTER — Other Ambulatory Visit (HOSPITAL_COMMUNITY): Payer: Self-pay

## 2023-04-28 DIAGNOSIS — C7951 Secondary malignant neoplasm of bone: Secondary | ICD-10-CM

## 2023-04-28 DIAGNOSIS — G893 Neoplasm related pain (acute) (chronic): Secondary | ICD-10-CM

## 2023-04-28 DIAGNOSIS — Z510 Encounter for antineoplastic radiation therapy: Secondary | ICD-10-CM | POA: Diagnosis not present

## 2023-04-28 DIAGNOSIS — C61 Malignant neoplasm of prostate: Secondary | ICD-10-CM | POA: Diagnosis not present

## 2023-04-28 DIAGNOSIS — Z191 Hormone sensitive malignancy status: Secondary | ICD-10-CM | POA: Diagnosis not present

## 2023-04-28 LAB — RAD ONC ARIA SESSION SUMMARY
Course Elapsed Days: 8
Plan Fractions Treated to Date: 7
Plan Prescribed Dose Per Fraction: 3 Gy
Plan Total Fractions Prescribed: 10
Plan Total Prescribed Dose: 30 Gy
Reference Point Dosage Given to Date: 21 Gy
Reference Point Session Dosage Given: 3 Gy
Session Number: 7

## 2023-04-28 MED ORDER — DEXAMETHASONE 2 MG PO TABS: 2.0000 mg | ORAL_TABLET | Freq: Two times a day (BID) | ORAL | 0 refills | Status: DC

## 2023-04-28 NOTE — Progress Notes (Addendum)
Received message from Palliative nurse Maygan requesting a refill/extension of steroids until the end of his radiation treatments, this was deferred to Dr. Kathrynn Running office from Dr. [name] and Wayne Alma, NP.   Wayne Perkins is currently on Decadron 2 mg BID.  Wayne Bruning, PA-C was informed and agrees, ok to stay on steroids at current dose and we will taper him off once he completes treatment. He has 3 more treatments to go.  Wayne Perkins will need to have a steroid taper at completion of treatment next week.

## 2023-04-29 ENCOUNTER — Telehealth: Payer: Self-pay

## 2023-04-29 ENCOUNTER — Other Ambulatory Visit (HOSPITAL_COMMUNITY): Payer: Self-pay

## 2023-04-29 ENCOUNTER — Other Ambulatory Visit: Payer: Self-pay | Admitting: Radiation Oncology

## 2023-04-29 ENCOUNTER — Other Ambulatory Visit: Payer: Self-pay

## 2023-04-29 ENCOUNTER — Encounter: Payer: Self-pay | Admitting: Hematology

## 2023-04-29 ENCOUNTER — Ambulatory Visit
Admission: RE | Admit: 2023-04-29 | Discharge: 2023-04-29 | Disposition: A | Discharge status: Home / Self Care | Payer: Medicare HMO | Source: Ambulatory Visit | Attending: Radiation Oncology | Admitting: Radiation Oncology

## 2023-04-29 DIAGNOSIS — G893 Neoplasm related pain (acute) (chronic): Secondary | ICD-10-CM

## 2023-04-29 DIAGNOSIS — Z510 Encounter for antineoplastic radiation therapy: Secondary | ICD-10-CM | POA: Diagnosis not present

## 2023-04-29 DIAGNOSIS — Z191 Hormone sensitive malignancy status: Secondary | ICD-10-CM | POA: Diagnosis not present

## 2023-04-29 DIAGNOSIS — C61 Malignant neoplasm of prostate: Secondary | ICD-10-CM

## 2023-04-29 DIAGNOSIS — C7951 Secondary malignant neoplasm of bone: Secondary | ICD-10-CM | POA: Diagnosis not present

## 2023-04-29 LAB — RAD ONC ARIA SESSION SUMMARY
Course Elapsed Days: 9
Plan Fractions Treated to Date: 8
Plan Prescribed Dose Per Fraction: 3 Gy
Plan Total Fractions Prescribed: 10
Plan Total Prescribed Dose: 30 Gy
Reference Point Dosage Given to Date: 24 Gy
Reference Point Session Dosage Given: 3 Gy
Session Number: 8

## 2023-04-29 MED ORDER — DEXAMETHASONE 2 MG PO TABS
ORAL_TABLET | ORAL | 0 refills | Status: AC
Start: 2023-04-29 — End: 2023-05-11
  Filled 2023-04-29: qty 12, 7d supply, fill #0

## 2023-04-29 NOTE — Telephone Encounter (Signed)
Pt wife called for dex refill, script sent in by [facility], returned call to inform pt/wife. No answer LVM and callback number

## 2023-05-03 ENCOUNTER — Ambulatory Visit
Admission: RE | Admit: 2023-05-03 | Discharge: 2023-05-03 | Disposition: A | Discharge status: Home / Self Care | Payer: Medicare HMO | Source: Ambulatory Visit | Attending: Hematology | Admitting: Hematology

## 2023-05-03 ENCOUNTER — Other Ambulatory Visit: Payer: Self-pay

## 2023-05-03 DIAGNOSIS — C7951 Secondary malignant neoplasm of bone: Secondary | ICD-10-CM | POA: Diagnosis not present

## 2023-05-03 DIAGNOSIS — Z191 Hormone sensitive malignancy status: Secondary | ICD-10-CM | POA: Diagnosis not present

## 2023-05-03 DIAGNOSIS — C61 Malignant neoplasm of prostate: Secondary | ICD-10-CM | POA: Insufficient documentation

## 2023-05-03 DIAGNOSIS — Z510 Encounter for antineoplastic radiation therapy: Secondary | ICD-10-CM | POA: Diagnosis not present

## 2023-05-03 LAB — RAD ONC ARIA SESSION SUMMARY
Course Elapsed Days: 13
Plan Fractions Treated to Date: 9
Plan Prescribed Dose Per Fraction: 3 Gy
Plan Total Fractions Prescribed: 10
Plan Total Prescribed Dose: 30 Gy
Reference Point Dosage Given to Date: 27 Gy
Reference Point Session Dosage Given: 3 Gy
Session Number: 9

## 2023-05-03 NOTE — Progress Notes (Signed)
Palliative Medicine [facility]  [phone] [phone]   Name: Wayne Perkins Date: 05/03/2023 MRN: [id number]  DOB: 1940/12/01  Patient Care Team: Sheliah Hatch, MD as PCP - General (Family Medicine) Jodelle Red, MD as PCP - Cardiology (Cardiology) Cindie Laroche, MD as Referring Physician (Urology) Felicita Gage, RN Nurse Navigator as Registered Nurse (Medical Oncology) Malachy Mood, MD as Consulting Physician (Hematology and Oncology) Pickenpack-Cousar, Arty Baumgartner, NP as Nurse Practitioner (Hospice and Palliative Medicine)    INTERVAL HISTORY: Wayne Perkins is a 82 y.o. adult with  oncologic medical history including malignant neoplasm of prostate (07/2019) with metastatic disease to bone, as well as HLD, hypertension, CAD, CKD stage 3, type 2 diabetes, anemia, and a TIA in 2020.  Palliative ask to see for symptom management and goals of care.   SOCIAL HISTORY:     reports that he has never smoked. He has never used smokeless tobacco. He reports that he does not currently use alcohol. He reports that he does not use drugs.  ADVANCE DIRECTIVES:  None on file  CODE STATUS: Full code  PAST MEDICAL HISTORY: Past Medical History:  Diagnosis Date   Arthritis    Blood in stool    Childhood asthma    Diabetes mellitus without complication (HCC)    type II   Heart disease    Hypertension, essential    Prostate cancer (HCC)    Stroke (HCC)     ALLERGIES:  is allergic to tramadol, glipizide, and statins.  MEDICATIONS:  Current Outpatient Medications  Medication Sig Dispense Refill   Alcohol Swabs (B-D SINGLE USE SWABS REGULAR) PADS      Blood Glucose Calibration (ACCU-CHEK AVIVA) SOLN      blood glucose meter kit and supplies Use once a day in the AM for fasting sugars. 1 each 0   calcium-vitamin D (OSCAL WITH D) 500-200 MG-UNIT tablet Take 1 tablet by mouth 2 (two) times daily. 60 tablet 3   Cholecalciferol (VITAMIN D3) 50  MCG (2000 UT) TABS Take by mouth.     clopidogrel (PLAVIX) 75 MG tablet TAKE 1 TABLET EVERY DAY 90 tablet 3   dexamethasone (DECADRON) 2 MG tablet Take 1 tablet (2 mg total) by mouth 2 (two) times daily for 5 days, THEN 1 tablet (2 mg total) daily for 7 days. 12 tablet 0   Dulaglutide (TRULICITY) 0.75 MG/0.5ML SOPN Inject 0.75 mg into the skin once a week. 4 pen 0   DULoxetine (CYMBALTA) 30 MG capsule Take 1 capsule (30 mg total) by mouth daily. 30 capsule 0   Ez Smart Blood Glucose Lancets MISC by Does not apply route. 100 each 3   glucose blood test strip Use as instructed 100 each 3   hydrochlorothiazide (HYDRODIURIL) 25 MG tablet Take 1 tablet (25 mg total) by mouth daily. 90 tablet 3   hydrOXYzine (ATARAX) 10 MG tablet TAKE 1 TABLET BY MOUTH 3 TIMES DAILY AS NEEDED FOR ITCHING. 270 tablet 1   isosorbide mononitrate (IMDUR) 30 MG 24 hr tablet Take 1 tablet (30 mg total) by mouth daily. 90 tablet 3   Multiple Vitamin (MULTI-DAY VITAMINS PO) Take by mouth daily at 6 (six) AM.     NIFEdipine (PROCARDIA XL/NIFEDICAL-XL) 90 MG 24 hr tablet Take 1 tablet (90 mg total) by mouth daily. 90 tablet 3   nitroGLYCERIN (NITROSTAT) 0.4 MG SL tablet Place 1 tablet (0.4 mg total) under the tongue every 5 (five) minutes as needed for chest  pain. 25 tablet 3   oxyCODONE (OXY IR/ROXICODONE) 5 MG immediate release tablet Take 1 tablet (5 mg total) by mouth every 6 (six) hours as needed for severe pain. 45 tablet 0   polyethylene glycol (MIRALAX / GLYCOLAX) 17 g packet Take 17 g by mouth as needed for moderate constipation.     potassium chloride SA (KLOR-CON M) 20 MEQ tablet Take 1 tablet (20 mEq total) by mouth 2 (two) times daily. 180 tablet 3   rosuvastatin (CRESTOR) 5 MG tablet TAKE 1 TABLET EVERY DAY 90 tablet 3   vitamin B-12 (CYANOCOBALAMIN) 1000 MCG tablet Take 1,000 mcg by mouth daily.     No current facility-administered medications for this visit.    VITAL SIGNS: There were no vitals taken for this  visit. There were no vitals filed for this visit.  Estimated body mass index is 20.66 kg/m as calculated from the following:   Height as of 04/25/23: 5\' 10"  (1.778 m).   Weight as of 04/25/23: 144 lb (65.3 kg).     Latest Ref Rng & Units 05/04/2023    1:56 PM 04/16/2023   11:04 AM 03/18/2023    2:37 PM  CMP  Glucose 70 - 99 mg/dL 284  132  440   BUN 8 - 23 mg/dL 54  51  33   Creatinine 0.61 - 1.24 mg/dL 1.02  7.25  3.66   Sodium 135 - 145 mmol/L 128  135  142   Potassium 3.5 - 5.1 mmol/L 4.1  3.8  3.2   Chloride 98 - 111 mmol/L 86  96  104   CO2 22 - 32 mmol/L 30  27  27    Calcium 8.9 - 10.3 mg/dL 9.2  9.0  9.0   Total Protein 6.5 - 8.1 g/dL 6.4   6.5   Total Bilirubin 0.3 - 1.2 mg/dL 0.5   0.3   Alkaline Phos 38 - 126 U/L 175   104   AST 15 - 41 U/L 20   21   ALT 0 - 44 U/L 18   10     PERFORMANCE STATUS (ECOG) : 1 - Symptomatic but completely ambulatory   Physical Exam General: NAD Cardiovascular: regular rate and rhythm Pulmonary: clear ant fields Abdomen: soft, nontender, + bowel sounds Extremities: no edema, no joint deformities Skin: no rashes Neurological: AAO x4  IMPRESSION: Wayne Perkins presents to clinic for follow-up. Wife and son are present. Denies nausea, vomiting, diarrhea. Ongoing fatigue related to radiation. Patient expresses appreciation of receiving last radiation treatment today. Sleeping without difficulty. Urinating without difficult. Endorses increase in urinary frequency.   Of note CMP identified blood glucose of 777. Fingerstick unable to read with reading >600. He has been on dexamethasone 2mg  twice daily for past 7-10 days in support of radiation treatments. Plans were to begin weaning over the next week. Patient is alert and oriented. Some blurred vision and urinary frequency on exam. Education provided on concerns with increased reading and need for further work-up and supportive measures required. Discussed with Dr. [name]. He understands he will have to  be evaluated in emergency department for further care. Family verbalized understanding. I spoke with ED Charge RN. Patient transported via wheelchair by myself and [place], RN. Assisted to ED stretcher and into hospital gown. Comfortable and secure in bed prior to departure. RN and NT at bedside. Report given to RN. Family present with patient.   Neoplasm related pain Wayne Perkins reports his pain is much improved and minimal  at this time. He is appreciative of this. Is not requiring daily pain medication at this time.    At this time given significant improvement he understands we will not make any adjustments to his regimen. We will continue to closely monitor and support in addition to his Oncology/Radiation team. He knows to contact the office with any changes.    Constipation Constipation has improved with twice daily Miralax and Senna. Patient reports last bowel movement this morning without difficulty. Encouraged to continue with regimen.   Goals of Care  04/21/23- We discussed his current illness and what it means in the larger context of his on-going co-morbidities. Natural disease trajectory and expectations were discussed.   Wayne Perkins and his wife are realist in their understanding of his current illness. They are appreciative of the care and support that he is receiving. They are remaining hopeful for ongoing improvement and clear in expressed wishes to continue to treat the treatable allowing him every opportunity to continue thriving while managing his symptoms and focusing on improving his quality of life.  We discussed Her current illness and what it means in the larger context of Her on-going co-morbidities. Natural disease trajectory and expectations were discussed.  I discussed the importance of continued conversation with family and their medical providers regarding overall plan of care and treatment options, ensuring decisions are within the context of the patients values and  GOCs.  PLAN: Patient transported to [facility] ED room 11 for further work-up due to hyperglycemia. CMP glucose 777. FSBS >600.  Continue oxycodone as needed at bedtime as needed. Not requiring daily.  Miralax twice daily Senna 2 tabs at bedtime. If no bowel movement for more than 48 hours increase to twice daily.  Ongoing goals of care and symptom management support I will plan to see patient back in 2-3 weeks in collaboration to other oncology appointments.    Patient expressed understanding and was in agreement with this plan. He also understands that He can call the clinic at any time with any questions, concerns, or complaints.   Any controlled substances utilized were prescribed in the context of palliative care. PDMP has been reviewed.    Visit consisted of counseling and education dealing with the complex and emotionally intense issues of symptom management and palliative care in the setting of serious and potentially life-threatening illness.Greater than 50%  of this time was spent counseling and coordinating care related to the above assessment and plan.  Willette Alma, AGPCNP-BC  Palliative Medicine Team/[facility]  *Please note that this is a verbal dictation therefore any spelling or grammatical errors are due to the "Dragon Medical One" system interpretation.

## 2023-05-04 ENCOUNTER — Inpatient Hospital Stay (HOSPITAL_BASED_OUTPATIENT_CLINIC_OR_DEPARTMENT_OTHER): Payer: Medicare HMO | Admitting: Hematology

## 2023-05-04 ENCOUNTER — Telehealth: Payer: Self-pay

## 2023-05-04 ENCOUNTER — Other Ambulatory Visit: Payer: Self-pay

## 2023-05-04 ENCOUNTER — Other Ambulatory Visit (HOSPITAL_COMMUNITY): Payer: Self-pay

## 2023-05-04 ENCOUNTER — Encounter (HOSPITAL_COMMUNITY): Payer: Self-pay | Admitting: Emergency Medicine

## 2023-05-04 ENCOUNTER — Observation Stay (HOSPITAL_COMMUNITY)
Admission: EM | Admit: 2023-05-04 | Discharge: 2023-05-05 | Disposition: A | Discharge status: Home / Self Care | Payer: Medicare HMO | Attending: Internal Medicine | Admitting: Internal Medicine

## 2023-05-04 ENCOUNTER — Other Ambulatory Visit: Payer: Self-pay | Admitting: Urology

## 2023-05-04 ENCOUNTER — Inpatient Hospital Stay: Payer: Medicare HMO | Attending: Oncology

## 2023-05-04 ENCOUNTER — Encounter: Payer: Self-pay | Admitting: Nurse Practitioner

## 2023-05-04 ENCOUNTER — Inpatient Hospital Stay (HOSPITAL_BASED_OUTPATIENT_CLINIC_OR_DEPARTMENT_OTHER): Payer: Medicare HMO | Admitting: Nurse Practitioner

## 2023-05-04 ENCOUNTER — Ambulatory Visit: Admission: RE | Admit: 2023-05-04 | Payer: Medicare HMO | Source: Ambulatory Visit

## 2023-05-04 VITALS — BP 122/69 | HR 84 | Temp 98.2°F | Resp 16 | Ht 70.0 in | Wt 144.8 lb

## 2023-05-04 DIAGNOSIS — Z510 Encounter for antineoplastic radiation therapy: Secondary | ICD-10-CM | POA: Diagnosis not present

## 2023-05-04 DIAGNOSIS — Z7985 Long-term (current) use of injectable non-insulin antidiabetic drugs: Secondary | ICD-10-CM | POA: Insufficient documentation

## 2023-05-04 DIAGNOSIS — Z955 Presence of coronary angioplasty implant and graft: Secondary | ICD-10-CM | POA: Diagnosis not present

## 2023-05-04 DIAGNOSIS — R54 Age-related physical debility: Secondary | ICD-10-CM | POA: Insufficient documentation

## 2023-05-04 DIAGNOSIS — Z8673 Personal history of transient ischemic attack (TIA), and cerebral infarction without residual deficits: Secondary | ICD-10-CM | POA: Insufficient documentation

## 2023-05-04 DIAGNOSIS — J45909 Unspecified asthma, uncomplicated: Secondary | ICD-10-CM | POA: Diagnosis not present

## 2023-05-04 DIAGNOSIS — I129 Hypertensive chronic kidney disease with stage 1 through stage 4 chronic kidney disease, or unspecified chronic kidney disease: Secondary | ICD-10-CM | POA: Diagnosis not present

## 2023-05-04 DIAGNOSIS — R530 Neoplastic (malignant) related fatigue: Secondary | ICD-10-CM | POA: Diagnosis not present

## 2023-05-04 DIAGNOSIS — G893 Neoplasm related pain (acute) (chronic): Secondary | ICD-10-CM

## 2023-05-04 DIAGNOSIS — Z79899 Other long term (current) drug therapy: Secondary | ICD-10-CM | POA: Diagnosis not present

## 2023-05-04 DIAGNOSIS — Z923 Personal history of irradiation: Secondary | ICD-10-CM | POA: Insufficient documentation

## 2023-05-04 DIAGNOSIS — N1832 Chronic kidney disease, stage 3b: Secondary | ICD-10-CM | POA: Insufficient documentation

## 2023-05-04 DIAGNOSIS — C7951 Secondary malignant neoplasm of bone: Secondary | ICD-10-CM | POA: Diagnosis not present

## 2023-05-04 DIAGNOSIS — Z515 Encounter for palliative care: Secondary | ICD-10-CM | POA: Diagnosis not present

## 2023-05-04 DIAGNOSIS — E876 Hypokalemia: Secondary | ICD-10-CM | POA: Diagnosis not present

## 2023-05-04 DIAGNOSIS — R770 Abnormality of albumin: Secondary | ICD-10-CM | POA: Insufficient documentation

## 2023-05-04 DIAGNOSIS — E538 Deficiency of other specified B group vitamins: Secondary | ICD-10-CM | POA: Diagnosis not present

## 2023-05-04 DIAGNOSIS — R739 Hyperglycemia, unspecified: Principal | ICD-10-CM

## 2023-05-04 DIAGNOSIS — D630 Anemia in neoplastic disease: Secondary | ICD-10-CM | POA: Insufficient documentation

## 2023-05-04 DIAGNOSIS — E1121 Type 2 diabetes mellitus with diabetic nephropathy: Secondary | ICD-10-CM

## 2023-05-04 DIAGNOSIS — E871 Hypo-osmolality and hyponatremia: Secondary | ICD-10-CM | POA: Diagnosis not present

## 2023-05-04 DIAGNOSIS — T380X5A Adverse effect of glucocorticoids and synthetic analogues, initial encounter: Secondary | ICD-10-CM | POA: Diagnosis present

## 2023-05-04 DIAGNOSIS — D631 Anemia in chronic kidney disease: Secondary | ICD-10-CM | POA: Diagnosis not present

## 2023-05-04 DIAGNOSIS — Z7902 Long term (current) use of antithrombotics/antiplatelets: Secondary | ICD-10-CM | POA: Insufficient documentation

## 2023-05-04 DIAGNOSIS — C61 Malignant neoplasm of prostate: Secondary | ICD-10-CM | POA: Insufficient documentation

## 2023-05-04 DIAGNOSIS — E1165 Type 2 diabetes mellitus with hyperglycemia: Secondary | ICD-10-CM | POA: Diagnosis not present

## 2023-05-04 DIAGNOSIS — E0965 Drug or chemical induced diabetes mellitus with hyperglycemia: Secondary | ICD-10-CM | POA: Diagnosis not present

## 2023-05-04 DIAGNOSIS — Z191 Hormone sensitive malignancy status: Secondary | ICD-10-CM | POA: Diagnosis not present

## 2023-05-04 DIAGNOSIS — E0922 Drug or chemical induced diabetes mellitus with diabetic chronic kidney disease: Secondary | ICD-10-CM | POA: Diagnosis not present

## 2023-05-04 LAB — CBC WITH DIFFERENTIAL (CANCER CENTER ONLY)
Abs Immature Granulocytes: 0.07 10*3/uL (ref 0.00–0.07)
Basophils Absolute: 0 10*3/uL (ref 0.0–0.1)
Basophils Relative: 0 %
Eosinophils Absolute: 0 10*3/uL (ref 0.0–0.5)
Eosinophils Relative: 0 %
HCT: 29.7 % — ABNORMAL LOW (ref 39.0–52.0)
Hemoglobin: 9.7 g/dL — ABNORMAL LOW (ref 13.0–17.0)
Immature Granulocytes: 1 %
Lymphocytes Relative: 7 %
Lymphs Abs: 0.4 10*3/uL — ABNORMAL LOW (ref 0.7–4.0)
MCH: 31 pg (ref 26.0–34.0)
MCHC: 32.7 g/dL (ref 30.0–36.0)
MCV: 94.9 fL (ref 80.0–100.0)
Monocytes Absolute: 0.4 10*3/uL (ref 0.1–1.0)
Monocytes Relative: 6 %
Neutro Abs: 4.8 10*3/uL (ref 1.7–7.7)
Neutrophils Relative %: 86 %
Platelet Count: 172 10*3/uL (ref 150–400)
RBC: 3.13 MIL/uL — ABNORMAL LOW (ref 4.22–5.81)
RDW: 17.7 % — ABNORMAL HIGH (ref 11.5–15.5)
WBC Count: 5.7 10*3/uL (ref 4.0–10.5)
nRBC: 0 % (ref 0.0–0.2)

## 2023-05-04 LAB — CBC WITH DIFFERENTIAL/PLATELET
Abs Immature Granulocytes: 0.05 10*3/uL (ref 0.00–0.07)
Basophils Absolute: 0 10*3/uL (ref 0.0–0.1)
Basophils Relative: 0 %
Eosinophils Absolute: 0 10*3/uL (ref 0.0–0.5)
Eosinophils Relative: 0 %
HCT: 30 % — ABNORMAL LOW (ref 39.0–52.0)
Hemoglobin: 9.8 g/dL — ABNORMAL LOW (ref 13.0–17.0)
Immature Granulocytes: 1 %
Lymphocytes Relative: 5 %
Lymphs Abs: 0.3 10*3/uL — ABNORMAL LOW (ref 0.7–4.0)
MCH: 30.9 pg (ref 26.0–34.0)
MCHC: 32.7 g/dL (ref 30.0–36.0)
MCV: 94.6 fL (ref 80.0–100.0)
Monocytes Absolute: 0.3 10*3/uL (ref 0.1–1.0)
Monocytes Relative: 6 %
Neutro Abs: 4.8 10*3/uL (ref 1.7–7.7)
Neutrophils Relative %: 88 %
Platelets: 179 10*3/uL (ref 150–400)
RBC: 3.17 MIL/uL — ABNORMAL LOW (ref 4.22–5.81)
RDW: 17.7 % — ABNORMAL HIGH (ref 11.5–15.5)
WBC: 5.5 10*3/uL (ref 4.0–10.5)
nRBC: 0 % (ref 0.0–0.2)

## 2023-05-04 LAB — URINALYSIS, ROUTINE W REFLEX MICROSCOPIC
Bacteria, UA: NONE SEEN
Bilirubin Urine: NEGATIVE
Glucose, UA: >=500 mg/dL — AB
Hgb urine dipstick: NEGATIVE
Ketones, ur: NEGATIVE mg/dL
Leukocytes,Ua: NEGATIVE
Nitrite: NEGATIVE
Protein, ur: NEGATIVE mg/dL
Specific Gravity, Urine: 1.02 (ref 1.005–1.030)
pH: 6 (ref 5.0–8.0)

## 2023-05-04 LAB — CMP (CANCER CENTER ONLY)
ALT: 18 U/L (ref 0–44)
AST: 20 U/L (ref 15–41)
Albumin: 3.5 g/dL (ref 3.5–5.0)
Alkaline Phosphatase: 175 U/L — ABNORMAL HIGH (ref 38–126)
Anion gap: 12 (ref 5–15)
BUN: 54 mg/dL — ABNORMAL HIGH (ref 8–23)
CO2: 30 mmol/L (ref 22–32)
Calcium: 9.2 mg/dL (ref 8.9–10.3)
Chloride: 86 mmol/L — ABNORMAL LOW (ref 98–111)
Creatinine: 1.65 mg/dL — ABNORMAL HIGH (ref 0.61–1.24)
GFR, Estimated: 41 mL/min — ABNORMAL LOW (ref >60)
Glucose, Bld: 777 mg/dL (ref 70–99)
Potassium: 4.1 mmol/L (ref 3.5–5.1)
Sodium: 128 mmol/L — ABNORMAL LOW (ref 135–145)
Total Bilirubin: 0.5 mg/dL (ref 0.3–1.2)
Total Protein: 6.4 g/dL — ABNORMAL LOW (ref 6.5–8.1)

## 2023-05-04 LAB — BLOOD GAS, VENOUS
Acid-Base Excess: 7.2 mmol/L — ABNORMAL HIGH (ref 0.0–2.0)
Bicarbonate: 32.6 mmol/L — ABNORMAL HIGH (ref 20.0–28.0)
O2 Saturation: 33.1 %
Patient temperature: 37
pCO2, Ven: 48 mmHg (ref 44–60)
pH, Ven: 7.44 — ABNORMAL HIGH (ref 7.25–7.43)
pO2, Ven: <31 mmHg — CL (ref 32–45)

## 2023-05-04 LAB — CBG MONITORING, ED
Glucose-Capillary: 366 mg/dL — ABNORMAL HIGH (ref 70–99)
Glucose-Capillary: 377 mg/dL — ABNORMAL HIGH (ref 70–99)
Glucose-Capillary: 534 mg/dL (ref 70–99)
Glucose-Capillary: >600 mg/dL (ref 70–99)
Glucose-Capillary: >600 mg/dL (ref 70–99)
Glucose-Capillary: >600 mg/dL (ref 70–99)
Glucose-Capillary: >600 mg/dL (ref 70–99)

## 2023-05-04 LAB — RAD ONC ARIA SESSION SUMMARY
Course Elapsed Days: 14
Plan Fractions Treated to Date: 10
Plan Prescribed Dose Per Fraction: 3 Gy
Plan Total Fractions Prescribed: 10
Plan Total Prescribed Dose: 30 Gy
Reference Point Dosage Given to Date: 30 Gy
Reference Point Session Dosage Given: 3 Gy
Session Number: 10

## 2023-05-04 LAB — BASIC METABOLIC PANEL
Anion gap: 15 (ref 5–15)
BUN: 54 mg/dL — ABNORMAL HIGH (ref 8–23)
CO2: 27 mmol/L (ref 22–32)
Calcium: 9 mg/dL (ref 8.9–10.3)
Chloride: 87 mmol/L — ABNORMAL LOW (ref 98–111)
Creatinine, Ser: 1.55 mg/dL — ABNORMAL HIGH (ref 0.61–1.24)
GFR, Estimated: 44 mL/min — ABNORMAL LOW (ref >60)
Glucose, Bld: 484 mg/dL — ABNORMAL HIGH (ref 70–99)
Potassium: 3.3 mmol/L — ABNORMAL LOW (ref 3.5–5.1)
Sodium: 129 mmol/L — ABNORMAL LOW (ref 135–145)

## 2023-05-04 LAB — BETA-HYDROXYBUTYRIC ACID: Beta-Hydroxybutyric Acid: 0.95 mmol/L — ABNORMAL HIGH (ref 0.05–0.27)

## 2023-05-04 MED ORDER — MELATONIN 5 MG PO TABS: 5.0000 mg | ORAL_TABLET | Freq: Every evening | ORAL | Status: DC | PRN

## 2023-05-04 MED ORDER — LACTATED RINGERS IV SOLN: INTRAVENOUS | Status: DC

## 2023-05-04 MED ORDER — POLYETHYLENE GLYCOL 3350 17 G PO PACK: 17.0000 g | PACK | Freq: Every day | ORAL | Status: DC | PRN

## 2023-05-04 MED ORDER — POTASSIUM CHLORIDE 10 MEQ/100ML IV SOLN
10.0000 meq | INTRAVENOUS | Status: AC
  Administered 2023-05-04 (×3): 10 meq via INTRAVENOUS
  Filled 2023-05-04 (×3): qty 100

## 2023-05-04 MED ORDER — ENOXAPARIN SODIUM 40 MG/0.4ML IJ SOSY
40.0000 mg | PREFILLED_SYRINGE | INTRAMUSCULAR | Status: DC
  Administered 2023-05-04: 40 mg via SUBCUTANEOUS
  Filled 2023-05-04: qty 0.4

## 2023-05-04 MED ORDER — ACETAMINOPHEN 325 MG PO TABS: 650.0000 mg | ORAL_TABLET | Freq: Four times a day (QID) | ORAL | Status: DC | PRN

## 2023-05-04 MED ORDER — DEXTROSE IN LACTATED RINGERS 5 % IV SOLN: INTRAVENOUS | Status: DC

## 2023-05-04 MED ORDER — DEXAMETHASONE 2 MG PO TABS
2.0000 mg | ORAL_TABLET | Freq: Every day | ORAL | 0 refills | Status: AC
  Filled 2023-05-04: qty 7, 7d supply, fill #0

## 2023-05-04 MED ORDER — DEXTROSE 50 % IV SOLN: 0.0000 mL | INTRAVENOUS | Status: DC | PRN

## 2023-05-04 MED ORDER — PROCHLORPERAZINE EDISYLATE 10 MG/2ML IJ SOLN: 5.0000 mg | Freq: Four times a day (QID) | INTRAMUSCULAR | Status: DC | PRN

## 2023-05-04 MED ORDER — INSULIN REGULAR(HUMAN) IN NACL 100-0.9 UT/100ML-% IV SOLN
INTRAVENOUS | Status: DC
  Administered 2023-05-04: 12 [IU]/h via INTRAVENOUS
  Filled 2023-05-04: qty 100

## 2023-05-04 NOTE — ED Triage Notes (Signed)
Pt to ER  from cancer center where he had his radiation treatment today.  Pt has been taking dexamethasone PO BID and blood glucose was noted to be in 700s by cancer center.  Pt has no c/o at this time.

## 2023-05-04 NOTE — Telephone Encounter (Signed)
Critical lab value reported:  Glucose 777  Dr. [name] notified.  Pt is on home Dexamethasone

## 2023-05-04 NOTE — Progress Notes (Signed)
[facility]   [phone] [phone]) [phone]   Clinic Follow up Note   Patient Care Team: Sheliah Hatch, MD as PCP - General (Family Medicine) Jodelle Red, MD as PCP - Cardiology (Cardiology) Cindie Laroche, MD as Referring Physician (Urology) Felicita Gage, RN Nurse Navigator as Registered Nurse (Medical Oncology) Malachy Mood, MD as Consulting Physician (Hematology and Oncology) Pickenpack-Cousar, Arty Baumgartner, NP as Nurse Practitioner [facility] and Palliative Medicine)  Date of Service:  05/04/2023  CHIEF COMPLAINT: f/u of  prostate cancer   CURRENT THERAPY:  Xofigo, under the care of Dr. Kathrynn Running  Eligard 22.5 mg every 3 months  ASSESSMENT:  Wayne Perkins is a 82 y.o. adult with   Malignant neoplasm of prostate metastatic to bone [facility]) -Stage IV with note and bone metastasis, diagnosed in 2021, castration resistant -Initially presented with localized disease and Gleason score 6 in 2015.  He received radiation treatment in 2015. -He started androgen deprivation therapy for biochemical recurrent disease subsequently, with PSA decreased down to 0.4 in 2018.  He received a intermittent ADT due to side effects. -He developed bone and lymph node metastasis in 2021, started Xtandi 160 mg daily in June 2021.  Therapy stopped after 3 months due to poor tolerance. -He started Zytiga 500 mg daily with prednisone 5 mg daily in May 2022.  Therapy discontinued in June 2022 due to poor tolerance.  He is only on Eligard every 3 months now. -He started [place] on September 29, 2022, he tolerated first treatment very well.  Second treatment was held a few weeks ago due to his worsening anemia. -His anemia is likely related to his bone metastasis from prostate cancer.  Will give blood transfusion if Hg<8 and let him continue Xofigo, which he completed on 02/23/23 -We reviewed other treatment options for metastatic prostate cancer, patient is not interested in  chemotherapy, but is open to other biological agent.  Will check his genetics and Guardant 360, to see if he is a candidate for PARP inhibitor down the road.    PLAN: -lab reviewed hg 9.7 -CMP- pending -PSA- pending -I discuss genetic testing and Guardant 360 for target therapy, will obtain on next visit  - I wrote a prescription for a walker -lab and f/u and injection on 9/25  SUMMARY OF ONCOLOGIC HISTORY: Oncology History Overview Note   Cancer Staging  Malignant neoplasm of prostate metastatic to bone [facility]) Staging form: Prostate, AJCC 8th Edition - Clinical: Stage IVB (cTX, cNX, pM1b, Grade Group: 3) - Signed by Benjiman Core, MD on 02/17/2021 Gleason score: 7 Histologic grading system: 5 grade system     Malignant neoplasm of prostate metastatic to bone (HCC)  07/31/2019 Initial Diagnosis   Malignant neoplasm of prostate metastatic to bone (HCC)   02/17/2021 Cancer Staging   Staging form: Prostate, AJCC 8th Edition - Clinical: Stage IVB (cTX, cNX, pM1b, Grade Group: 3) - Signed by Benjiman Core, MD on 02/17/2021 Gleason score: 7 Histologic grading system: 5 grade system   Prostate cancer (HCC)  02/19/2020 Initial Diagnosis   Prostate cancer (HCC)      INTERVAL HISTORY:  Wayne Perkins is here for a follow up of prostate cancer. He was last seen by me on 04/14/2023. He presents to the clinic accompanied by wife and son. Pt state that he is still on prednisone. Pt state that he is getting better. Pt state that he is able to get around , but not go a long distance. Pt takes  oxycodone 1 tablet only at night , he states that it helps with his joint pain in he's knees. Pt state that he has some constipation , he uses Miralax.     All other systems were reviewed with the patient and are negative.  MEDICAL HISTORY:  Past Medical History:  Diagnosis Date   Arthritis    Blood in stool    Childhood asthma    Diabetes mellitus without complication (HCC)    type II   Heart  disease    Hypertension, essential    Prostate cancer (HCC)    Stroke [facility])     SURGICAL HISTORY: Past Surgical History:  Procedure Laterality Date   CORONARY ANGIOPLASTY WITH STENT PLACEMENT     GANGLION CYST EXCISION Right    PROSTATE BIOPSY      I have reviewed the social history and family history with the patient and they are unchanged from previous note.  ALLERGIES:  is allergic to tramadol, glipizide, and statins.  MEDICATIONS:  Current Outpatient Medications  Medication Sig Dispense Refill   Alcohol Swabs (B-D SINGLE USE SWABS REGULAR) PADS      Blood Glucose Calibration (ACCU-CHEK AVIVA) SOLN      blood glucose meter kit and supplies Use once a day in the AM for fasting sugars. 1 each 0   calcium-vitamin D (OSCAL WITH D) 500-200 MG-UNIT tablet Take 1 tablet by mouth 2 (two) times daily. 60 tablet 3   Cholecalciferol (VITAMIN D3) 50 MCG (2000 UT) TABS Take by mouth.     clopidogrel (PLAVIX) 75 MG tablet TAKE 1 TABLET EVERY DAY 90 tablet 3   dexamethasone (DECADRON) 2 MG tablet Take 1 tablet (2 mg total) by mouth daily for 7 days. 7 tablet 0   Dulaglutide (TRULICITY) 0.75 MG/0.5ML SOPN Inject 0.75 mg into the skin once a week. 4 pen 0   DULoxetine (CYMBALTA) 30 MG capsule Take 1 capsule (30 mg total) by mouth daily. 30 capsule 0   Ez Smart Blood Glucose Lancets MISC by Does not apply route. 100 each 3   glucose blood test strip Use as instructed 100 each 3   hydrochlorothiazide (HYDRODIURIL) 25 MG tablet Take 1 tablet (25 mg total) by mouth daily. 90 tablet 3   hydrOXYzine (ATARAX) 10 MG tablet TAKE 1 TABLET BY MOUTH 3 TIMES DAILY AS NEEDED FOR ITCHING. 270 tablet 1   isosorbide mononitrate (IMDUR) 30 MG 24 hr tablet Take 1 tablet (30 mg total) by mouth daily. 90 tablet 3   Multiple Vitamin (MULTI-DAY VITAMINS PO) Take by mouth daily at 6 (six) AM.     NIFEdipine (PROCARDIA XL/NIFEDICAL-XL) 90 MG 24 hr tablet Take 1 tablet (90 mg total) by mouth daily. 90 tablet 3    nitroGLYCERIN (NITROSTAT) 0.4 MG SL tablet Place 1 tablet (0.4 mg total) under the tongue every 5 (five) minutes as needed for chest pain. 25 tablet 3   oxyCODONE (OXY IR/ROXICODONE) 5 MG immediate release tablet Take 1 tablet (5 mg total) by mouth every 6 (six) hours as needed for severe pain. 45 tablet 0   polyethylene glycol (MIRALAX / GLYCOLAX) 17 g packet Take 17 g by mouth as needed for moderate constipation.     potassium chloride SA (KLOR-CON M) 20 MEQ tablet Take 1 tablet (20 mEq total) by mouth 2 (two) times daily. 180 tablet 3   rosuvastatin (CRESTOR) 5 MG tablet TAKE 1 TABLET EVERY DAY 90 tablet 3   vitamin B-12 (CYANOCOBALAMIN) 1000 MCG tablet Take 1,000  mcg by mouth daily.     No current facility-administered medications for this visit.    PHYSICAL EXAMINATION: ECOG PERFORMANCE STATUS: 2 - Symptomatic, <50% confined to bed  Vitals:   05/04/23 1420  BP: 122/69  Pulse: 84  Resp: 16  Temp: 98.2 F (36.8 C)  SpO2: 100%   Wt Readings from Last 3 Encounters:  05/04/23 144 lb 12.8 oz (65.7 kg)  04/25/23 144 lb (65.3 kg)  04/23/23 151 lb 9.6 oz (68.8 kg)     GENERAL:alert, no distress and comfortable SKIN: skin color normal, no rashes or significant lesions EYES: normal, Conjunctiva are pink and non-injected, sclera clear  NEURO: alert & oriented x 3 with fluent speech LABORATORY DATA:  I have reviewed the data as listed    Latest Ref Rng & Units 05/04/2023    1:56 PM 04/16/2023   11:04 AM 04/15/2023    2:50 PM  CBC  WBC 4.0 - 10.5 K/uL 5.7  7.0  8.8   Hemoglobin 13.0 - 17.0 g/dL 9.7  9.5  65.7   Hematocrit 39.0 - 52.0 % 29.7  28.1  29.2   Platelets 150 - 400 K/uL 172  213  219         Latest Ref Rng & Units 04/16/2023   11:04 AM 03/18/2023    2:37 PM 03/17/2023   10:54 AM  CMP  Glucose 70 - 99 mg/dL 846  962  952   BUN 8 - 23 mg/dL 51  33  34   Creatinine 0.61 - 1.24 mg/dL 8.41  3.24  4.01   Sodium 135 - 145 mmol/L 135  142  140   Potassium 3.5 - 5.1 mmol/L  3.8  3.2  3.6   Chloride 98 - 111 mmol/L 96  104  102   CO2 22 - 32 mmol/L 27  27  28    Calcium 8.9 - 10.3 mg/dL 9.0  9.0  9.1   Total Protein 6.5 - 8.1 g/dL  6.5  6.5   Total Bilirubin 0.3 - 1.2 mg/dL  0.3  0.3   Alkaline Phos 38 - 126 U/L  104  96   AST 15 - 41 U/L  21  22   ALT 0 - 44 U/L  10  11       RADIOGRAPHIC STUDIES: I have personally reviewed the radiological images as listed and agreed with the findings in the report. No results found.    Orders Placed This Encounter  Procedures   Guardant 360    Standing Status:   Future    Standing Expiration Date:   05/06/2024   Ambulatory referral to Genetics    Referral Priority:   Routine    Referral Type:   Consultation    Referral Reason:   Specialty Services Required    Number of Visits Requested:   1   All questions were answered. The patient knows to call the clinic with any problems, questions or concerns. No barriers to learning was detected. The total time spent in the appointment was 30 minutes.     Malachy Mood, MD 05/04/2023   Carolin Coy, CMA, am acting as scribe for Malachy Mood, MD.   I have reviewed the above documentation for accuracy and completeness, and I agree with the above.

## 2023-05-04 NOTE — Patient Instructions (Signed)
-   do not take any steroids tonight (05/04/2023) - tomorrow take  - if needed use biotine (over the counter dry mouth rinse) to help with your dry mouth - let us know if you develop any sore spots in your mouth

## 2023-05-04 NOTE — Assessment & Plan Note (Signed)
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  He received radiation treatment in 2015. -He started androgen deprivation therapy for biochemical recurrent disease subsequently, with PSA decreased down to 0.4 in 2018.  He received a intermittent ADT due to side effects. -He developed bone and lymph node metastasis in 2021, started Xtandi 160 mg daily in June 2021.  Therapy stopped after 3 months due to poor tolerance. -He started Zytiga 500 mg daily with prednisone 5 mg daily in May 2022.  Therapy discontinued in June 2022 due to poor tolerance.  He is only on Eligard every 3 months now. -He started [place] on September 29, 2022, he tolerated first treatment very well.  Second treatment was held a few weeks ago due to his worsening anemia. -His anemia is likely related to his bone metastasis from prostate cancer.  Will give blood transfusion if Hg<8 and let him continue Xofigo, which he completed on 02/23/23 -We reviewed other treatment options for metastatic prostate cancer, patient is not interested in chemotherapy, but is open to other biological agent.  Will check his genetics and Guardant 360, to see if he is a candidate for PARP inhibitor down the road.

## 2023-05-04 NOTE — H&P (Incomplete)
History and Physical  Wayne Perkins ZOX:[id number] DOB: 02/24/1941 DOA: 05/04/2023  Referring physician: Fayrene Helper, PA-EDP  PCP: Sheliah Hatch, MD  Outpatient Specialists: Medical oncology Patient coming from: Home  Chief Complaint: Abnormal labs.   HPI: Wayne Perkins is a 82 y.o. adult with medical history significant for metastatic prostate cancer, type 2 diabetes, who presented to the ED sent from the cancer center due to abnormal labs, found to be severely hyperglycemic with serum glucose 777.  The patient endorses that he has been on prednisone since receiving radiation and is currently being weaned off steroids.  No reported subjective fevers.  Admits to chills.  No nausea or vomiting.  In the ED, the patient was started on IV fluid and insulin drip for steroid-induced hyperglycemia.  TRH, hospitalist service, was asked to admit.  ED Course: Tmax 97.5.  BP 119/71, pulse 82, respiration rate 22, O2 saturation 94% on room air.  Lab studies remarkable for serum sodium 123, serum glucose 777, BUN 54, creatinine 1.65, alkaline phosphatase 175, GFR 41.  Review of Systems: Review of systems as noted in the HPI. All other systems reviewed and are negative.   Past Medical History:  Diagnosis Date   Arthritis    Blood in stool    Childhood asthma    Diabetes mellitus without complication (HCC)    type II   Heart disease    Hypertension, essential    Prostate cancer (HCC)    Stroke [facility])    Past Surgical History:  Procedure Laterality Date   CORONARY ANGIOPLASTY WITH STENT PLACEMENT     GANGLION CYST EXCISION Right    PROSTATE BIOPSY      Social History:  reports that he has never smoked. He has never used smokeless tobacco. He reports that he does not currently use alcohol. He reports that he does not use drugs.   Allergies  Allergen Reactions   Tramadol Other (See Comments)    Muscle pains and sensation of needles in muscles    Dexamethasone Other (See Comments)     Elevated BGL into the 700's   Glipizide Itching   Statins Other (See Comments)    Causes narcolepsy    Family History  Problem Relation Age of Onset   Hyperlipidemia Mother    Hypertension Mother    Stroke Father    Hyperlipidemia Sister    Depression Brother    Early death Brother    Heart disease Brother    Mental illness Brother    Prostate cancer Neg Hx    Breast cancer Neg Hx    Colon cancer Neg Hx    Pancreatic cancer Neg Hx       Prior to Admission medications   Medication Sig Start Date End Date Taking? Authorizing Provider  calcium-vitamin D (OSCAL WITH D) 500-200 MG-UNIT tablet Take 1 tablet by mouth 2 (two) times daily. 09/04/20  Yes Benjiman Core, MD  Cholecalciferol (VITAMIN D3) 50 MCG (2000 UT) TABS Take 2,000 Units by mouth daily.   Yes [provider]  clopidogrel (PLAVIX) 75 MG tablet TAKE 1 TABLET EVERY DAY Patient taking differently: Take 75 mg by mouth in the morning. 08/16/22  Yes Sheliah Hatch, MD  dexamethasone (DECADRON) 2 MG tablet Take 1 tablet (2 mg total) by mouth daily for 7 days. 05/04/23 05/11/23 Yes Bruning, Ashlyn, PA-C  Dulaglutide (TRULICITY) 0.75 MG/0.5ML SOPN Inject 0.75 mg into the skin every Wednesday. 12/31/19  Yes Orland Mustard, MD  hydrochlorothiazide (HYDRODIURIL) 25 MG  tablet Take 1 tablet (25 mg total) by mouth daily. 03/17/23  Yes Sheliah Hatch, MD  hydrOXYzine (ATARAX) 10 MG tablet TAKE 1 TABLET BY MOUTH 3 TIMES DAILY AS NEEDED FOR ITCHING. Patient taking differently: Take 10 mg by mouth 3 (three) times daily as needed for itching. 03/17/23  Yes Sheliah Hatch, MD  isosorbide mononitrate (IMDUR) 30 MG 24 hr tablet Take 1 tablet (30 mg total) by mouth daily. Patient taking differently: Take 30 mg by mouth daily as needed (for chest pain). 08/17/22 08/12/23 Yes Alver Sorrow, NP  Multiple Vitamin (MULTI-DAY VITAMINS PO) Take 1 tablet by mouth daily with breakfast.   Yes [provider]  NIFEdipine  (PROCARDIA XL/NIFEDICAL-XL) 90 MG 24 hr tablet Take 1 tablet (90 mg total) by mouth daily. 03/17/23  Yes Sheliah Hatch, MD  nitroGLYCERIN (NITROSTAT) 0.4 MG SL tablet Place 1 tablet (0.4 mg total) under the tongue every 5 (five) minutes as needed for chest pain. 08/09/22 05/04/23 Yes Alver Sorrow, NP  oxyCODONE (OXY IR/ROXICODONE) 5 MG immediate release tablet Take 1 tablet (5 mg total) by mouth every 6 (six) hours as needed for severe pain. Patient taking differently: Take 5 mg by mouth at bedtime. 03/11/23  Yes Georga Kaufmann T, PA-C  polyethylene glycol (MIRALAX / GLYCOLAX) 17 g packet Take 8.5 g by mouth in the morning and at bedtime.   Yes [provider]  potassium chloride SA (KLOR-CON M) 20 MEQ tablet Take 1 tablet (20 mEq total) by mouth 2 (two) times daily. 03/17/23  Yes Sheliah Hatch, MD  rosuvastatin (CRESTOR) 5 MG tablet TAKE 1 TABLET EVERY DAY 12/22/22  Yes Jodelle Red, MD  TYLENOL 325 MG CAPS Take 325-650 mg by mouth every 6 (six) hours as needed (for pain).   Yes [provider]  vitamin B-12 (CYANOCOBALAMIN) 1000 MCG tablet Take 1,000 mcg by mouth daily.   Yes [provider]  Alcohol Swabs (B-D SINGLE USE SWABS REGULAR) PADS  11/05/19   [provider]  Blood Glucose Calibration (ACCU-CHEK AVIVA) SOLN  11/05/19   [provider]  blood glucose meter kit and supplies Use once a day in the AM for fasting sugars. 11/02/19   Orland Mustard, MD  DULoxetine (CYMBALTA) 30 MG capsule Take 1 capsule (30 mg total) by mouth daily. 04/14/23   Malachy Mood, MD  Ez Smart Blood Glucose Lancets MISC by Does not apply route. 10/24/19   Orland Mustard, MD  glucose blood test strip Use as instructed 10/24/19   Orland Mustard, MD    Physical Exam: BP 114/76   Pulse 80   Temp (!) 97.5 F (36.4 C) (Oral)   Resp 19   Ht 5\' 10"  (1.778 m)   Wt 65.7 kg   SpO2 97%   BMI 20.78 kg/m   General: 82 y.o. year-old adult  frail-appearing in no acute  distress.  Alert and oriented x3. Cardiovascular: Regular rate and rhythm with no rubs or gallops.  No thyromegaly or JVD noted.  No lower extremity edema. 2/4 pulses in all 4 extremities. Respiratory: Clear to auscultation with no wheezes or rales. Good inspiratory effort. Abdomen: Soft nontender nondistended with normal bowel sounds x4 quadrants. Muskuloskeletal: No cyanosis, clubbing or edema noted bilaterally Neuro: CN II-XII intact, strength, sensation, reflexes Skin: No ulcerative lesions noted or rashes Psychiatry: Judgement and insight appear normal. Mood is appropriate for condition and setting          Labs on Admission:  Basic  Metabolic Panel: Recent Labs  Lab 05/04/23 1356 05/04/23 1809  NA 128* 129*  K 4.1 3.3*  CL 86* 87*  CO2 30 27  GLUCOSE 777* 484*  BUN 54* 54*  CREATININE 1.65* 1.55*  CALCIUM 9.2 9.0   Liver Function Tests: Recent Labs  Lab 05/04/23 1356  AST 20  ALT 18  ALKPHOS 175*  BILITOT 0.5  PROT 6.4*  ALBUMIN 3.5   No results for input(s): "LIPASE", "AMYLASE" in the last 168 hours. No results for input(s): "AMMONIA" in the last 168 hours. CBC: Recent Labs  Lab 05/04/23 1356 05/04/23 1617  WBC 5.7 5.5  NEUTROABS 4.8 4.8  HGB 9.7* 9.8*  HCT 29.7* 30.0*  MCV 94.9 94.6  PLT 172 179   Cardiac Enzymes: No results for input(s): "CKTOTAL", "CKMB", "CKMBINDEX", "TROPONINI" in the last 168 hours.  BNP (last 3 results) No results for input(s): "BNP" in the last 8760 hours.  ProBNP (last 3 results) No results for input(s): "PROBNP" in the last 8760 hours.  CBG: Recent Labs  Lab 05/04/23 1539 05/04/23 1623 05/04/23 1709 05/04/23 1717 05/04/23 1746  GLUCAP >600* >600* >600* >600* 534*    Radiological Exams on Admission: No results found.  EKG: I independently viewed the EKG done and my findings are as followed: None available at the time of this visit.  Assessment/Plan Present on Admission:  Steroid-induced  hyperglycemia  Principal Problem:   Steroid-induced hyperglycemia  Steroid-induced hyperglycemia in the setting of type 2 diabetes Sent from cancer center with serum glucose of 777 No evidence of metabolic acidosis on lab work. Started on insulin drip in the ED, continue Continue IV fluid hydration Last hemoglobin A1c 7.6 on 03/17/2023 Diabetes coordinator consulted for patient's education  Pseudohyponatremia Corrected sodium 128 for hyperglycemia 777 is calculated 139 Continue LR for IV fluid hydration  Hypokalemia Serum potassium 3.3 Replete intravenously and orally while insulin drip is running Obtain magnesium level in the morning  CKD 3B At baseline Avoid nephrotoxic agents, dehydration, and hypotension Monitor urine output Repeat renal panel in the morning  Anemia of chronic disease Hemoglobin 9.4 No overt bleeding reported Monitor H&H  Physical debility PT OT assessment Fall precautions  Hyperlipidemia Resume home statin  Vitamin B12 deficiency Resume home B12 supplement   Time: 75 minutes.   DVT prophylaxis: Subcu Lovenox daily  Code Status: Full code  Family Communication: Wife at bedside.  Disposition Plan: Admitted to stepdown unit  Consults called: None.  Admission status: Inpatient status.   Status is: Inpatient The patient requires at least 2 midnights for further evaluation and treatment of present condition.   Darlin Drop MD [facility] Pager [phone]  If 7PM-7AM, please contact night-coverage [email or web address] Password [facility]  05/04/2023, 8:51 PM

## 2023-05-04 NOTE — ED Provider Notes (Signed)
[facility]PART[facility] AT [facility] Provider Note   CSN: [id number] Arrival date & time: 05/04/23  1534     History  Chief Complaint  Patient presents with   Hyperglycemia    Wayne Perkins is a 82 y.o. adult.  The history is provided by the patient, medical records and the spouse. No language interpreter was used.  Hyperglycemia    Wayne Perkins is a 82 y.o. male with a medical history significant for metastatic prostate cancer and type 2 diabetes who presents to the ED with concern for hyperglycemia. Patient underwent his last radiation treatment this morning where it was noticed that his blood sugar was in the 700s, and it was recommended that he come to the ED for evaluation. Of note, patient is on Dexamethasone BID as part of his cancer treatment. Currently patient endorses increased thirst and dry mouth. Denies abdominal pain, nausea, vomiting, or changes to appetite. Diabetes has been managed with daily Trulicity injections. He does not check his blood sugar levels daily.   Home Medications Prior to Admission medications   Medication Sig Start Date End Date Taking? Authorizing Provider  Alcohol Swabs (B-D SINGLE USE SWABS REGULAR) PADS  11/05/19   [provider]  Blood Glucose Calibration (ACCU-CHEK AVIVA) SOLN  11/05/19   [provider]  blood glucose meter kit and supplies Use once a day in the AM for fasting sugars. 11/02/19   Orland Mustard, MD  calcium-vitamin D (OSCAL WITH D) 500-200 MG-UNIT tablet Take 1 tablet by mouth 2 (two) times daily. 09/04/20   Benjiman Core, MD  Cholecalciferol (VITAMIN D3) 50 MCG (2000 UT) TABS Take by mouth.    [provider]  clopidogrel (PLAVIX) 75 MG tablet TAKE 1 TABLET EVERY DAY 08/16/22   Sheliah Hatch, MD  dexamethasone (DECADRON) 2 MG tablet Take 1 tablet (2 mg total) by mouth daily for 7 days. 05/04/23 05/11/23  Bruning, Ashlyn, PA-C  Dulaglutide (TRULICITY) 0.75 MG/0.5ML SOPN Inject 0.75 mg  into the skin once a week. 12/31/19   Orland Mustard, MD  DULoxetine (CYMBALTA) 30 MG capsule Take 1 capsule (30 mg total) by mouth daily. 04/14/23   Malachy Mood, MD  Ez Smart Blood Glucose Lancets MISC by Does not apply route. 10/24/19   Orland Mustard, MD  glucose blood test strip Use as instructed 10/24/19   Orland Mustard, MD  hydrochlorothiazide (HYDRODIURIL) 25 MG tablet Take 1 tablet (25 mg total) by mouth daily. 03/17/23   Sheliah Hatch, MD  hydrOXYzine (ATARAX) 10 MG tablet TAKE 1 TABLET BY MOUTH 3 TIMES DAILY AS NEEDED FOR ITCHING. 03/17/23   Sheliah Hatch, MD  isosorbide mononitrate (IMDUR) 30 MG 24 hr tablet Take 1 tablet (30 mg total) by mouth daily. 08/17/22 08/12/23  Alver Sorrow, NP  Multiple Vitamin (MULTI-DAY VITAMINS PO) Take by mouth daily at 6 (six) AM.    [provider]  NIFEdipine (PROCARDIA XL/NIFEDICAL-XL) 90 MG 24 hr tablet Take 1 tablet (90 mg total) by mouth daily. 03/17/23   Sheliah Hatch, MD  nitroGLYCERIN (NITROSTAT) 0.4 MG SL tablet Place 1 tablet (0.4 mg total) under the tongue every 5 (five) minutes as needed for chest pain. 08/09/22 11/07/22  Alver Sorrow, NP  oxyCODONE (OXY IR/ROXICODONE) 5 MG immediate release tablet Take 1 tablet (5 mg total) by mouth every 6 (six) hours as needed for severe pain. 03/11/23   Georga Kaufmann T, PA-C  polyethylene glycol (MIRALAX / GLYCOLAX) 17 g packet  Take 17 g by mouth as needed for moderate constipation.    [provider]  potassium chloride SA (KLOR-CON M) 20 MEQ tablet Take 1 tablet (20 mEq total) by mouth 2 (two) times daily. 03/17/23   Sheliah Hatch, MD  rosuvastatin (CRESTOR) 5 MG tablet TAKE 1 TABLET EVERY DAY 12/22/22   Jodelle Red, MD  vitamin B-12 (CYANOCOBALAMIN) 1000 MCG tablet Take 1,000 mcg by mouth daily.    [provider]      Allergies    Tramadol, Glipizide, and Statins    Review of Systems   Review of Systems  All other systems reviewed and are  negative.   Physical Exam Updated Vital Signs BP 123/78 (BP Location: Right Arm)   Temp (!) 97.5 F (36.4 C) (Oral)   Ht 5\' 10"  (1.778 m)   Wt 65.7 kg   BMI 20.78 kg/m  Physical Exam Vitals and nursing note reviewed.  Constitutional:      General: He is not in acute distress.    Appearance: He is well-developed.  HENT:     Head: Atraumatic.     Mouth/Throat:     Mouth: Mucous membranes are dry.  Eyes:     Conjunctiva/sclera: Conjunctivae normal.  Cardiovascular:     Rate and Rhythm: Normal rate and regular rhythm.     Pulses: Normal pulses.     Heart sounds: Normal heart sounds.  Pulmonary:     Effort: Pulmonary effort is normal.     Breath sounds: Normal breath sounds.  Abdominal:     Palpations: Abdomen is soft.     Tenderness: There is no abdominal tenderness.  Musculoskeletal:        General: Normal range of motion.     Cervical back: Normal range of motion and neck supple.  Skin:    Findings: No rash.  Neurological:     Mental Status: He is alert and oriented to person, place, and time.  Psychiatric:        Mood and Affect: Mood normal.     ED Results / Procedures / Treatments   Labs (all labs ordered are listed, but only abnormal results are displayed) Labs Reviewed  CBC WITH DIFFERENTIAL/PLATELET - Abnormal; Notable for the following components:      Result Value   RBC 3.17 (*)    Hemoglobin 9.8 (*)    HCT 30.0 (*)    RDW 17.7 (*)    Lymphs Abs 0.3 (*)    All other components within normal limits  URINALYSIS, ROUTINE W REFLEX MICROSCOPIC - Abnormal; Notable for the following components:   Color, Urine STRAW (*)    Glucose, UA >=500 (*)    All other components within normal limits  BETA-HYDROXYBUTYRIC ACID - Abnormal; Notable for the following components:   Beta-Hydroxybutyric Acid 0.95 (*)    All other components within normal limits  BLOOD GAS, VENOUS - Abnormal; Notable for the following components:   pH, Ven 7.44 (*)    pO2, Ven <31 (*)     Bicarbonate 32.6 (*)    Acid-Base Excess 7.2 (*)    All other components within normal limits  BASIC METABOLIC PANEL - Abnormal; Notable for the following components:   Sodium 129 (*)    Potassium 3.3 (*)    Chloride 87 (*)    Glucose, Bld 484 (*)    BUN 54 (*)    Creatinine, Ser 1.55 (*)    GFR, Estimated 44 (*)    All other components  within normal limits  CBG MONITORING, ED - Abnormal; Notable for the following components:   Glucose-Capillary >600 (*)    All other components within normal limits  CBG MONITORING, ED - Abnormal; Notable for the following components:   Glucose-Capillary >600 (*)    All other components within normal limits  CBG MONITORING, ED - Abnormal; Notable for the following components:   Glucose-Capillary >600 (*)    All other components within normal limits  CBG MONITORING, ED - Abnormal; Notable for the following components:   Glucose-Capillary >600 (*)    All other components within normal limits  CBG MONITORING, ED - Abnormal; Notable for the following components:   Glucose-Capillary 534 (*)    All other components within normal limits    EKG None  Radiology No results found.  Procedures .Critical Care  Performed by: Fayrene Helper, PA-C Authorized by: Fayrene Helper, PA-C   Critical care provider statement:    Critical care time (minutes):  43   Critical care was time spent personally by me on the following activities:  Development of treatment plan with patient or surrogate, discussions with consultants, evaluation of patient's response to treatment, examination of patient, ordering and review of laboratory studies, ordering and review of radiographic studies, ordering and performing treatments and interventions, pulse oximetry, re-evaluation of patient's condition and review of old charts     Medications Ordered in ED Medications  insulin regular, human (MYXREDLIN) 100 units/ 100 mL infusion (9 Units/hr Intravenous Rate/Dose Change 05/04/23 1748)   lactated ringers infusion ( Intravenous New Bag/Given 05/04/23 1629)  dextrose 5 % in lactated ringers infusion (has no administration in time range)  dextrose 50 % solution 0-50 mL (has no administration in time range)  potassium chloride 10 mEq in 100 mL IVPB (has no administration in time range)    ED Course/ Medical Decision Making/ A&P Clinical Course as of 05/04/23 1934  Wed May 04, 2023  1906 HHS vs DKA [CC]    Clinical Course User Index [CC] Glyn Ade, MD                                 Medical Decision Making Amount and/or Complexity of Data Reviewed Labs: ordered.  Risk Prescription drug management.   BP 130/74   Pulse 84   Temp (!) 97.5 F (36.4 C) (Oral)   Resp (!) 21   Ht 5\' 10"  (1.778 m)   Wt 65.7 kg   SpO2 98%   BMI 20.78 kg/m   4:15 PM This is an 84 male family history of prostate cancer who just finished his last dose of radiation therapy today and while he was on his way out of the facility he was noted to have elevated blood sugar and the staff recommend patient to come to the ER for evaluation.  Patient is without any specific complaint except some excessive thirst and increased urination.  He reported he was having some constipation several days ago but that has since improved.  He does not endorse any fever or chills no runny nose sneezing or coughing no abdominal pain no back pain and no other complaint.  Patient is currently on prednisone as part of his radiation treatment.  Patient states he has been on his steroid for the past 10 days.  He does not check his blood sugar at home.  He denies any other recent sick contact or anything else that concerns him.  On exam elderly male resting comfortably in bed appears to be in no acute discomfort.  Heart with with normal rate and rhythm, lungs clear to auscultation bilaterally abdomen is soft nontender bowel sounds present, and he is able to move all 4 extremities.  He is alert and oriented x 4.  His  initial CBG is greater than 700.  -Labs ordered, independently viewed and interpreted by me.  Labs remarkable for CBG >700 without evidence of DKA or HHS.  K+ 3.3, supplementation given.  UA without ketone.  Normal WBC.  pH 7.44 with PO2 <31.  Beta hydroxybutyric acid 0.95. -The patient was maintained on a cardiac monitor.  I personally viewed and interpreted the cardiac monitored which showed an underlying rhythm of: NSR -Imaging including abd/pelvis CT considered but not performed.   -This patient presents to the ED for concern of abnormal labs, this involves an extensive number of treatment options, and is a complaint that carries with it a high risk of complications and morbidity.  The differential diagnosis includes lab error, steroid induce hyperglycemia, drug induce hyperglycemia, stress response, pancreatitis -Co morbidities that complicate the patient evaluation includes prostate CA -Treatment includes endotool to control hyperglycemia -Reevaluation of the patient after these medicines showed that the patient improved -PCP office notes or outside notes reviewed -Discussion with specialist attending Dr. Doran Durand -Escalation to admission/observation considered: patients is agreeable with admission.   8:48 PM Appreciate consultation from [facility] Dr. Margo Aye who agrees to see and will admit pt for further care.         Final Clinical Impression(s) / ED Diagnoses Final diagnoses:  Hyperglycemia  Hypokalemia    Rx / DC Orders ED Discharge Orders     None         Fayrene Helper, PA-C 05/04/23 2049    Glyn Ade, MD 05/04/23 [phone]

## 2023-05-04 NOTE — ED Notes (Signed)
ED TO INPATIENT HANDOFF REPORT  ED Nurse Name and Phone #:  [phone]  S Name/Age/Gender Wayne Perkins 82 y.o. adult Room/Bed: WA11/WA11  Code Status   Code Status: Full Code  Home/SNF/Other Home Patient oriented to: self, place, time, and situation Is this baseline? Yes   Triage Complete: Triage complete  Chief Complaint Steroid-induced hyperglycemia [R73.9, T38.0X5A]  Triage Note Pt to ER  from cancer center where he had his radiation treatment today.  Pt has been taking dexamethasone PO BID and blood glucose was noted to be in 700s by cancer center.  Pt has no c/o at this time.   Allergies Allergies  Allergen Reactions   Tramadol Other (See Comments)    Muscle pains and sensation of needles in muscles    Dexamethasone Other (See Comments)    Elevated BGL into the 700's   Glipizide Itching   Statins Other (See Comments)    Causes narcolepsy    Level of Care/Admitting Diagnosis ED Disposition     ED Disposition  Admit   Condition  --   Comment  Hospital Area: [facility] [[id number]]  Level of Care: Stepdown [14]  Admit to SDU based on following criteria: Severe physiological/psychological symptoms:  Any diagnosis requiring assessment & intervention at least every 4 hours on an ongoing basis to obtain desired patient outcomes including stability and rehabilitation  May admit patient to [facility] or [facility] if equivalent level of care is available:: Yes  Covid Evaluation: Asymptomatic - no recent exposure (last 10 days) testing not required  Diagnosis: Steroid-induced hyperglycemia [725325]  Admitting Physician: Darlin Drop [[id number]]  Attending Physician: Darlin Drop [[id number]]  Certification:: I certify this patient will need inpatient services for at least 2 midnights  Expected Medical Readiness: 05/06/2023          B Medical/Surgery History Past Medical History:  Diagnosis Date   Arthritis    Blood in stool    Childhood asthma     Diabetes mellitus without complication (HCC)    type II   Heart disease    Hypertension, essential    Prostate cancer (HCC)    Stroke [facility])    Past Surgical History:  Procedure Laterality Date   CORONARY ANGIOPLASTY WITH STENT PLACEMENT     GANGLION CYST EXCISION Right    PROSTATE BIOPSY       A IV Location/Drains/Wounds Patient Lines/Drains/Airways Status     Active Line/Drains/Airways     Name Placement date Placement time Site Days   Peripheral IV 05/04/23 20 G Anterior;Distal;Left;Upper Arm 05/04/23  1624  Arm  less than 1   Peripheral IV 05/04/23 20 G Right Antecubital 05/04/23  2047  Antecubital  less than 1   Peripheral IV 05/04/23 22 G 1" Anterior;Distal;Right Forearm 05/04/23  2200  Forearm  less than 1   Urethral Catheter Sam H 16 Fr. 04/23/23  2003  --  11            Intake/Output Last 24 hours  Intake/Output Summary (Last 24 hours) at 05/04/2023 2307 Last data filed at 05/04/2023 1748 Gross per 24 hour  Intake --  Output 375 ml  Net -375 ml    Labs/Imaging Results for orders placed or performed during the hospital encounter of 05/04/23 (from the past 48 hour(s))  CBG monitoring, ED     Status: Abnormal   Collection Time: 05/04/23  3:39 PM  Result Value Ref Range   Glucose-Capillary >600 (HH) 70 - 99 mg/dL  Comment: Glucose reference range applies only to samples taken after fasting for at least 8 hours.  CBC with Differential (PNL)     Status: Abnormal   Collection Time: 05/04/23  4:17 PM  Result Value Ref Range   WBC 5.5 4.0 - 10.5 K/uL   RBC 3.17 (L) 4.22 - 5.81 MIL/uL   Hemoglobin 9.8 (L) 13.0 - 17.0 g/dL   HCT 13.0 (L) 86.5 - 78.4 %   MCV 94.6 80.0 - 100.0 fL   MCH 30.9 26.0 - 34.0 pg   MCHC 32.7 30.0 - 36.0 g/dL   RDW 69.6 (H) 29.5 - 28.4 %   Platelets 179 150 - 400 K/uL   nRBC 0.0 0.0 - 0.2 %   Neutrophils Relative % 88 %   Neutro Abs 4.8 1.7 - 7.7 K/uL   Lymphocytes Relative 5 %   Lymphs Abs 0.3 (L) 0.7 - 4.0 K/uL   Monocytes  Relative 6 %   Monocytes Absolute 0.3 0.1 - 1.0 K/uL   Eosinophils Relative 0 %   Eosinophils Absolute 0.0 0.0 - 0.5 K/uL   Basophils Relative 0 %   Basophils Absolute 0.0 0.0 - 0.1 K/uL   Immature Granulocytes 1 %   Abs Immature Granulocytes 0.05 0.00 - 0.07 K/uL    Comment: Performed at [facility], [address], [place], [place] [address]  Beta-hydroxybutyric acid     Status: Abnormal   Collection Time: 05/04/23  4:17 PM  Result Value Ref Range   Beta-Hydroxybutyric Acid 0.95 (H) 0.05 - 0.27 mmol/L    Comment: Performed at [facility], [address], [place], [place] [address]  CBG monitoring, ED     Status: Abnormal   Collection Time: 05/04/23  4:23 PM  Result Value Ref Range   Glucose-Capillary >600 (HH) 70 - 99 mg/dL    Comment: Glucose reference range applies only to samples taken after fasting for at least 8 hours.  Blood gas, venous (at [facility])     Status: Abnormal   Collection Time: 05/04/23  4:44 PM  Result Value Ref Range   pH, Ven 7.44 (H) 7.25 - 7.43   pCO2, Ven 48 44 - 60 mmHg   pO2, Ven <31 (LL) 32 - 45 mmHg    Comment: CRITICAL RESULT CALLED TO, READ BACK BY AND VERIFIED WITH: D.SOMMERVILLE, RN AT 1659 ON 09.04.24 BY N.THOMPSON    Bicarbonate 32.6 (H) 20.0 - 28.0 mmol/L   Acid-Base Excess 7.2 (H) 0.0 - 2.0 mmol/L   O2 Saturation 33.1 %   Patient temperature 37.0     Comment: Performed at [facility], [address], [place], [place] [address]  CBG monitoring, ED     Status: Abnormal   Collection Time: 05/04/23  5:09 PM  Result Value Ref Range   Glucose-Capillary >600 (HH) 70 - 99 mg/dL    Comment: Glucose reference range applies only to samples taken after fasting for at least 8 hours.  CBG monitoring, ED     Status: Abnormal   Collection Time: 05/04/23  5:17 PM  Result Value Ref Range   Glucose-Capillary >600 (HH) 70 - 99 mg/dL    Comment: Glucose reference range applies only to samples taken  after fasting for at least 8 hours.   Comment 1 Notify RN    Comment 2 Document in Chart   CBG monitoring, ED     Status: Abnormal   Collection Time: 05/04/23  5:46 PM  Result Value  Ref Range   Glucose-Capillary 534 (HH) 70 - 99 mg/dL    Comment: Glucose reference range applies only to samples taken after fasting for at least 8 hours.  Urinalysis, Routine w reflex microscopic -Urine, Clean Catch     Status: Abnormal   Collection Time: 05/04/23  5:47 PM  Result Value Ref Range   Color, Urine STRAW (A) YELLOW   APPearance CLEAR CLEAR   Specific Gravity, Urine 1.020 1.005 - 1.030   pH 6.0 5.0 - 8.0   Glucose, UA >=500 (A) NEGATIVE mg/dL   Hgb urine dipstick NEGATIVE NEGATIVE   Bilirubin Urine NEGATIVE NEGATIVE   Ketones, ur NEGATIVE NEGATIVE mg/dL   Protein, ur NEGATIVE NEGATIVE mg/dL   Nitrite NEGATIVE NEGATIVE   Leukocytes,Ua NEGATIVE NEGATIVE   RBC / HPF 0-5 0 - 5 RBC/hpf   WBC, UA 0-5 0 - 5 WBC/hpf   Bacteria, UA NONE SEEN NONE SEEN   Squamous Epithelial / HPF 0-5 0 - 5 /HPF    Comment: Performed at [facility], [address], [place], [place] [address]  Basic metabolic panel     Status: Abnormal   Collection Time: 05/04/23  6:09 PM  Result Value Ref Range   Sodium 129 (L) 135 - 145 mmol/L   Potassium 3.3 (L) 3.5 - 5.1 mmol/L   Chloride 87 (L) 98 - 111 mmol/L   CO2 27 22 - 32 mmol/L   Glucose, Bld 484 (H) 70 - 99 mg/dL    Comment: Glucose reference range applies only to samples taken after fasting for at least 8 hours.   BUN 54 (H) 8 - 23 mg/dL   Creatinine, Ser 6.04 (H) 0.61 - 1.24 mg/dL   Calcium 9.0 8.9 - 54.0 mg/dL   GFR, Estimated 44 (L) >60 mL/min    Comment: (NOTE) Calculated using the CKD-EPI Creatinine Equation (2021)    Anion gap 15 5 - 15    Comment: Performed at [facility], [address], [place], [place] [address]  CBG monitoring, ED     Status: Abnormal   Collection Time: 05/04/23 10:17 PM  Result Value Ref Range    Glucose-Capillary 366 (H) 70 - 99 mg/dL    Comment: Glucose reference range applies only to samples taken after fasting for at least 8 hours.   No results found.  Pending Labs Unresulted Labs (From admission, onward)     Start     Ordered   05/11/23 0500  Creatinine, serum  (enoxaparin (LOVENOX)    CrCl >/= 30 ml/min)  Weekly,   R     Comments: while on enoxaparin therapy    05/04/23 2050   05/04/23 2050  CBC  (enoxaparin (LOVENOX)    CrCl >/= 30 ml/min)  Once,   R       Comments: Baseline for enoxaparin therapy IF NOT ALREADY DRAWN.  Notify MD if PLT < 100 K.    05/04/23 2050            Vitals/Pain Today's Vitals   05/04/23 2115 05/04/23 2130 05/04/23 2200 05/04/23 2215  BP: 113/89 119/69 113/87 116/67  Pulse: (!) 101 84 80 86  Resp: 12 (!) 22 (!) 22 (!) 22  Temp:      TempSrc:      SpO2:  96% 97% 98%  Weight:      Height:      PainSc:        Isolation Precautions No active isolations  Medications Medications  insulin  regular, human (MYXREDLIN) 100 units/ 100 mL infusion (0 Units/hr Intravenous Paused 05/04/23 1905)  lactated ringers infusion ( Intravenous New Bag/Given 05/04/23 1629)  dextrose 5 % in lactated ringers infusion (has no administration in time range)  dextrose 50 % solution 0-50 mL (has no administration in time range)  potassium chloride 10 mEq in 100 mL IVPB (10 mEq Intravenous New Bag/Given 05/04/23 2217)  enoxaparin (LOVENOX) injection 40 mg (has no administration in time range)  acetaminophen (TYLENOL) tablet 650 mg (has no administration in time range)  prochlorperazine (COMPAZINE) injection 5 mg (has no administration in time range)  polyethylene glycol (MIRALAX / GLYCOLAX) packet 17 g (has no administration in time range)  melatonin tablet 5 mg (has no administration in time range)    Mobility walks     Focused Assessments     R Recommendations: See Admitting Provider Note  Report given to:   Additional Notes:

## 2023-05-05 ENCOUNTER — Other Ambulatory Visit (HOSPITAL_COMMUNITY): Payer: Self-pay

## 2023-05-05 DIAGNOSIS — R739 Hyperglycemia, unspecified: Principal | ICD-10-CM | POA: Diagnosis present

## 2023-05-05 DIAGNOSIS — Z7902 Long term (current) use of antithrombotics/antiplatelets: Secondary | ICD-10-CM | POA: Diagnosis not present

## 2023-05-05 DIAGNOSIS — T380X5A Adverse effect of glucocorticoids and synthetic analogues, initial encounter: Secondary | ICD-10-CM | POA: Diagnosis not present

## 2023-05-05 DIAGNOSIS — R531 Weakness: Secondary | ICD-10-CM | POA: Diagnosis not present

## 2023-05-05 DIAGNOSIS — C61 Malignant neoplasm of prostate: Secondary | ICD-10-CM

## 2023-05-05 DIAGNOSIS — J45909 Unspecified asthma, uncomplicated: Secondary | ICD-10-CM | POA: Diagnosis not present

## 2023-05-05 DIAGNOSIS — Z79899 Other long term (current) drug therapy: Secondary | ICD-10-CM | POA: Diagnosis not present

## 2023-05-05 DIAGNOSIS — Z955 Presence of coronary angioplasty implant and graft: Secondary | ICD-10-CM | POA: Diagnosis not present

## 2023-05-05 DIAGNOSIS — E871 Hypo-osmolality and hyponatremia: Secondary | ICD-10-CM | POA: Diagnosis not present

## 2023-05-05 DIAGNOSIS — Z8673 Personal history of transient ischemic attack (TIA), and cerebral infarction without residual deficits: Secondary | ICD-10-CM | POA: Diagnosis not present

## 2023-05-05 DIAGNOSIS — Z7985 Long-term (current) use of injectable non-insulin antidiabetic drugs: Secondary | ICD-10-CM | POA: Diagnosis not present

## 2023-05-05 DIAGNOSIS — E0965 Drug or chemical induced diabetes mellitus with hyperglycemia: Secondary | ICD-10-CM | POA: Diagnosis not present

## 2023-05-05 DIAGNOSIS — C7951 Secondary malignant neoplasm of bone: Secondary | ICD-10-CM | POA: Diagnosis not present

## 2023-05-05 LAB — CBC
HCT: 27.5 % — ABNORMAL LOW (ref 39.0–52.0)
HCT: 27.7 % — ABNORMAL LOW (ref 39.0–52.0)
Hemoglobin: 9.2 g/dL — ABNORMAL LOW (ref 13.0–17.0)
Hemoglobin: 9.4 g/dL — ABNORMAL LOW (ref 13.0–17.0)
MCH: 31.2 pg (ref 26.0–34.0)
MCH: 31.3 pg (ref 26.0–34.0)
MCHC: 33.5 g/dL (ref 30.0–36.0)
MCHC: 33.9 g/dL (ref 30.0–36.0)
MCV: 92.3 fL (ref 80.0–100.0)
MCV: 93.2 fL (ref 80.0–100.0)
Platelets: 155 10*3/uL (ref 150–400)
Platelets: 162 10*3/uL (ref 150–400)
RBC: 2.95 MIL/uL — ABNORMAL LOW (ref 4.22–5.81)
RBC: 3 MIL/uL — ABNORMAL LOW (ref 4.22–5.81)
RDW: 17.4 % — ABNORMAL HIGH (ref 11.5–15.5)
RDW: 17.6 % — ABNORMAL HIGH (ref 11.5–15.5)
WBC: 5.1 10*3/uL (ref 4.0–10.5)
WBC: 5.9 10*3/uL (ref 4.0–10.5)
nRBC: 0.3 % — ABNORMAL HIGH (ref 0.0–0.2)
nRBC: 0.4 % — ABNORMAL HIGH (ref 0.0–0.2)

## 2023-05-05 LAB — MAGNESIUM: Magnesium: 2 mg/dL (ref 1.7–2.4)

## 2023-05-05 LAB — COMPREHENSIVE METABOLIC PANEL
ALT: 20 U/L (ref 0–44)
AST: 29 U/L (ref 15–41)
Albumin: 2.7 g/dL — ABNORMAL LOW (ref 3.5–5.0)
Alkaline Phosphatase: 138 U/L — ABNORMAL HIGH (ref 38–126)
Anion gap: 13 (ref 5–15)
BUN: 45 mg/dL — ABNORMAL HIGH (ref 8–23)
CO2: 26 mmol/L (ref 22–32)
Calcium: 9.1 mg/dL (ref 8.9–10.3)
Chloride: 97 mmol/L — ABNORMAL LOW (ref 98–111)
Creatinine, Ser: 1.31 mg/dL — ABNORMAL HIGH (ref 0.61–1.24)
GFR, Estimated: 54 mL/min — ABNORMAL LOW (ref >60)
Glucose, Bld: 139 mg/dL — ABNORMAL HIGH (ref 70–99)
Potassium: 3.6 mmol/L (ref 3.5–5.1)
Sodium: 136 mmol/L (ref 135–145)
Total Bilirubin: 0.5 mg/dL (ref 0.3–1.2)
Total Protein: 5.8 g/dL — ABNORMAL LOW (ref 6.5–8.1)

## 2023-05-05 LAB — GLUCOSE, CAPILLARY
Glucose-Capillary: 132 mg/dL — ABNORMAL HIGH (ref 70–99)
Glucose-Capillary: 133 mg/dL — ABNORMAL HIGH (ref 70–99)
Glucose-Capillary: 137 mg/dL — ABNORMAL HIGH (ref 70–99)
Glucose-Capillary: 144 mg/dL — ABNORMAL HIGH (ref 70–99)
Glucose-Capillary: 160 mg/dL — ABNORMAL HIGH (ref 70–99)
Glucose-Capillary: 216 mg/dL — ABNORMAL HIGH (ref 70–99)
Glucose-Capillary: 265 mg/dL — ABNORMAL HIGH (ref 70–99)
Glucose-Capillary: 321 mg/dL — ABNORMAL HIGH (ref 70–99)

## 2023-05-05 LAB — PHOSPHORUS: Phosphorus: 1.2 mg/dL — ABNORMAL LOW (ref 2.5–4.6)

## 2023-05-05 LAB — MRSA NEXT GEN BY PCR, NASAL: MRSA by PCR Next Gen: NOT DETECTED

## 2023-05-05 LAB — PROSTATE-SPECIFIC AG, SERUM (LABCORP): Prostate Specific Ag, Serum: 1870 ng/mL — ABNORMAL HIGH (ref 0.0–4.0)

## 2023-05-05 MED ORDER — POTASSIUM CHLORIDE CRYS ER 20 MEQ PO TBCR
40.0000 meq | EXTENDED_RELEASE_TABLET | ORAL | Status: AC
  Administered 2023-05-05: 40 meq via ORAL
  Filled 2023-05-05: qty 2

## 2023-05-05 MED ORDER — OXYCODONE HCL 5 MG PO TABS: 5.0000 mg | ORAL_TABLET | Freq: Four times a day (QID) | ORAL | Status: DC | PRN

## 2023-05-05 MED ORDER — LACTATED RINGERS IV SOLN
INTRAVENOUS | Status: DC
Start: 2023-05-05 — End: 2023-05-05

## 2023-05-05 MED ORDER — METFORMIN HCL 500 MG PO TABS: 500.0000 mg | ORAL_TABLET | Freq: Two times a day (BID) | ORAL | 0 refills | Status: DC

## 2023-05-05 MED ORDER — HYDROXYZINE HCL 10 MG PO TABS: 10.0000 mg | ORAL_TABLET | Freq: Three times a day (TID) | ORAL | Status: DC | PRN

## 2023-05-05 MED ORDER — VITAMIN D 25 MCG (1000 UNIT) PO TABS
2000.0000 [IU] | ORAL_TABLET | Freq: Every day | ORAL | Status: DC
  Administered 2023-05-05: 2000 [IU] via ORAL
  Filled 2023-05-05: qty 2

## 2023-05-05 MED ORDER — PHENOL 1.4 % MT LIQD
1.0000 | OROMUCOSAL | Status: DC | PRN
  Administered 2023-05-05: 1 via OROMUCOSAL
  Filled 2023-05-05: qty 177

## 2023-05-05 MED ORDER — LACTATED RINGERS IV SOLN: INTRAVENOUS | Status: DC

## 2023-05-05 MED ORDER — INSULIN ASPART 100 UNIT/ML IJ SOLN
0.0000 [IU] | Freq: Three times a day (TID) | INTRAMUSCULAR | Status: DC
  Administered 2023-05-05: 5 [IU] via SUBCUTANEOUS
  Administered 2023-05-05: 1 [IU] via SUBCUTANEOUS

## 2023-05-05 MED ORDER — DULOXETINE HCL 30 MG PO CPEP
30.0000 mg | ORAL_CAPSULE | Freq: Every day | ORAL | Status: DC
  Administered 2023-05-05: 30 mg via ORAL
  Filled 2023-05-05: qty 1

## 2023-05-05 MED ORDER — CLOPIDOGREL BISULFATE 75 MG PO TABS
75.0000 mg | ORAL_TABLET | Freq: Every day | ORAL | Status: DC
  Administered 2023-05-05: 75 mg via ORAL
  Filled 2023-05-05: qty 1

## 2023-05-05 MED ORDER — POTASSIUM PHOSPHATES 15 MMOLE/5ML IV SOLN
30.0000 mmol | Freq: Once | INTRAVENOUS | Status: AC
  Administered 2023-05-05: 30 mmol via INTRAVENOUS
  Filled 2023-05-05: qty 10

## 2023-05-05 MED ORDER — DAPAGLIFLOZIN PROPANEDIOL 10 MG PO TABS: 10.0000 mg | ORAL_TABLET | Freq: Every day | ORAL | 0 refills | Status: DC

## 2023-05-05 MED ORDER — ADULT MULTIVITAMIN W/MINERALS CH
1.0000 | ORAL_TABLET | Freq: Every day | ORAL | Status: DC
  Administered 2023-05-05: 1 via ORAL
  Filled 2023-05-05: qty 1

## 2023-05-05 MED ORDER — DEXAMETHASONE 4 MG PO TABS
2.0000 mg | ORAL_TABLET | Freq: Every day | ORAL | Status: DC
  Administered 2023-05-05: 2 mg via ORAL
  Filled 2023-05-05: qty 1

## 2023-05-05 MED ORDER — GLIMEPIRIDE 2 MG PO TABS: 2.0000 mg | ORAL_TABLET | ORAL | 0 refills | Status: DC

## 2023-05-05 MED ORDER — POTASSIUM CHLORIDE CRYS ER 20 MEQ PO TBCR: 40.0000 meq | EXTENDED_RELEASE_TABLET | Freq: Two times a day (BID) | ORAL | Status: DC

## 2023-05-05 MED ORDER — CHLORHEXIDINE GLUCONATE CLOTH 2 % EX PADS: 6.0000 | MEDICATED_PAD | Freq: Every day | CUTANEOUS | Status: DC

## 2023-05-05 MED ORDER — POTASSIUM CHLORIDE 10 MEQ/100ML IV SOLN
10.0000 meq | INTRAVENOUS | Status: AC
  Administered 2023-05-05 (×3): 10 meq via INTRAVENOUS
  Filled 2023-05-05 (×2): qty 100

## 2023-05-05 MED ORDER — VITAMIN B-12 1000 MCG PO TABS
1000.0000 ug | ORAL_TABLET | Freq: Every day | ORAL | Status: DC
  Administered 2023-05-05: 1000 ug via ORAL
  Filled 2023-05-05: qty 1

## 2023-05-05 MED ORDER — INSULIN ASPART 100 UNIT/ML IJ SOLN: 0.0000 [IU] | Freq: Every day | INTRAMUSCULAR | Status: DC

## 2023-05-05 MED ORDER — POTASSIUM CHLORIDE IN NACL 40-0.9 MEQ/L-% IV SOLN
INTRAVENOUS | Status: DC
  Filled 2023-05-05: qty 1000

## 2023-05-05 MED ORDER — INSULIN GLARGINE-YFGN 100 UNIT/ML ~~LOC~~ SOLN
14.0000 [IU] | Freq: Every day | SUBCUTANEOUS | Status: DC
  Administered 2023-05-05: 14 [IU] via SUBCUTANEOUS
  Filled 2023-05-05: qty 0.14

## 2023-05-05 MED ORDER — ROSUVASTATIN CALCIUM 5 MG PO TABS
5.0000 mg | ORAL_TABLET | Freq: Every day | ORAL | Status: DC
  Administered 2023-05-05: 5 mg via ORAL
  Filled 2023-05-05: qty 1

## 2023-05-05 NOTE — Progress Notes (Signed)
PT Cancellation Note  Patient Details Name: Wayne Perkins MRN: [id number] DOB: 03-24-1941   Cancelled Treatment:    Reason Eval/Treat Not Completed: Other (comment) PT orders received, chart reviewed. Nursing reports need to perform EKG at this time. Will f/u as able.  Aleda Grana, PT, DPT 05/05/23, 3:34 PM   Sandi Mariscal 05/05/2023, 3:34 PM

## 2023-05-05 NOTE — Care Management CC44 (Signed)
Condition Code 44 Documentation Completed  Patient Details  Name: Wayne Perkins MRN: [id number] Date of Birth: 18-Nov-1940   Condition Code 44 given:    Patient signature on Condition Code 44 notice:    Documentation of 2 MD's agreement:    Code 44 added to claim:       Harriett Sine, RN 05/05/2023, 4:24 PM

## 2023-05-05 NOTE — Care Management Obs Status (Signed)
MEDICARE OBSERVATION STATUS NOTIFICATION   Patient Details  Name: Vontrell Stendahl MRN: [id number] Date of Birth: 1940-10-28   Medicare Observation Status Notification Given:  Yes    Armanda Heritage, RN 05/05/2023, 4:48 PM

## 2023-05-05 NOTE — TOC Benefit Eligibility Note (Signed)
Patient Product/process development scientist completed.    The patient is insured through [employer]. Patient has Medicare and is not eligible for a copay card, but may be able to apply for patient assistance, if available.    Ran test claim for Lantus Pen and the current 30 day co-pay is $35.00.  Ran test claim for Novolog FlexPen and the current 30 day co-pay is $35.00.  Ran test claim for [employer] and Requires Prior Authorization  This test claim was processed through [employer]- copay amounts may vary at other pharmacies due to [employer], or as the patient moves through the different stages of their insurance plan.     Roland Earl, CPHT Pharmacy Technician III Certified Patient Advocate [facility] Patient Advocate Team Direct Number: [phone]  Fax: [phone]

## 2023-05-05 NOTE — Hospital Course (Addendum)
The patient is an 82 year old elderly chronically ill-appearing Caucasian male with a past medical history significant for arrival to metastatic prostate cancer, diabetes mellitus type 2, as well as other comorbidities who presented to ED from the cancer center due to abnormal labs and was found to have significantly elevated blood sugar of serum glucose of 777.  He endorsed that he has been on prednisone since receiving radiation and is currently being weaned off of steroids and doses just been reduced.  He reported no subjective fevers or chills but family thinks he has been extremely weak.  In the ED started on IV fluid hydration and given an insulin drip for steroid-induced hyperglycemia.  TRH was asked to admit this patient and he was quickly weaned off of the insulin drip.  PT OT evaluated recommending home health.  His blood sugars improved improved significantly diabetes education coordinator evaluated and recommended a sulfonylurea but patient has a allergy so we will discharge him with just a SGLT2 inhibitor short-term and have him follow-up with his PCP.  He is stable for discharge and wants to go home and PT OT recommended home health which will be arranged for the patient.  Prior to his discharge his labs were checked and his labs have stabilized but his phosphorus was 1.2 so was replete with IV K-Phos.  He will need to follow-up with his PCP and continue blood sugar monitoring carefully and continue further evaluation and treatment with his medical oncologist and radiation oncologist.  Assessment and Plan:  Steroid-induced hyperglycemia in the setting of type 2 diabetes, improved -Sent from cancer center with serum glucose of 777 -No evidence of metabolic acidosis on lab work. -Started on insulin drip in the ED, continued and weaned and doing well and steroids have been reduced and weaning so we will discharge him on SGLT2 inhibitor with Marcelline Deist for 7 days given his allergy and reaction to  glipizide's and sulfonylureas -Continue IV fluid hydration -Last hemoglobin A1c 7.6 on 03/17/2023 -Diabetes coordinator consulted for patient's education and will also give him 500 mg of metformin twice daily   Pseudohyponatremia -Corrected sodium 128 for hyperglycemia 777 is calculated 139 -Continue LR for IV fluid hydration and is improved -Na+ Trend: Recent Labs  Lab 04/16/23 1104 05/04/23 1356 05/04/23 1809 05/05/23 0303  NA 135 128* 129* 136  -Continue to Monitor and Trend and repeat CMP within 1 week   Hypokalemia -Patient's K+ Level Trend: Recent Labs  Lab 04/16/23 1104 05/04/23 1356 05/04/23 1809 05/05/23 0303  K 3.8 4.1 3.3* 3.6  -Replete with IV K Phos 30 mmol today -Continue to Monitor and Replete as Necessary -Repeat CMP in the AM   Bigeminy -Intermittent and sustained for 2 minutes -No chest pain, palpitations shortness of breath or symptoms -Follow-up with Dr. Janne Napoleon of cardiology and I discussed with Dr. Was the only while patient was hospitalized and he recommends no Holter monitor for discharge at this time  Sore Throat -In the setting of his radiation -Continue with phenol spray and have outpatient SLP evaluation  Generalized Weakness and Physical Debility -PT OT assessment and recommending Home Health -Fall precautions   Hyperlipidemia -Resume home statin   Vitamin B12 Deficiency -Resume home B12 supplement  Malignant Neoplasm of the Prostate with Metastatic Disease  -Continue Outpatient follow up with PCP, Medical Oncology and Radiation Oncology  CKD Stage 3a -BUN/Cr Trend: Recent Labs  Lab 04/16/23 1104 05/04/23 1356 05/04/23 1809 05/05/23 0303  BUN 51* 54* 54* 45*  CREATININE 1.41*  1.65* 1.55* 1.31*  -Avoid Nephrotoxic Medications, Contrast Dyes, Hypotension and Dehydration to Ensure Adequate Renal Perfusion and will need to Renally Adjust Meds -Continue to Monitor and Trend Renal Function carefully and repeat CMP in  the AM   Normocytic Anemia/Anemia of Chronic Disease  -Hgb/Hct Trend: Recent Labs  Lab 04/15/23 1450 04/16/23 1104 05/04/23 1356 05/04/23 1617 05/04/23 2345 05/05/23 0303  HGB 10.0* 9.5* 9.7* 9.8* 9.4* 9.2*  HCT 29.2* 28.1* 29.7* 30.0* 27.7* 27.5*  MCV 93.0 92.7 94.9 94.6 92.3 93.2  -Check Anemia Panel in the AM -Continue to Monitor for S/Sx of Bleeding; No overt bleeding noted -Repeat CBC in the AM   Hypophosphatemia -Phos Level Trend: Recent Labs  Lab 05/05/23 0303  PHOS 1.2*  -Replete with IV K Phos 30 mmol -Continue to Monitor and Replete as Necessary -Repeat Phos Level in the AM  Hypoalbuminemia -Patient's Albumin Trend: Recent Labs  Lab 05/04/23 1356 05/05/23 0303  ALBUMIN 3.5 2.7*  -Continue to Monitor and Trend and repeat CMP in the AM

## 2023-05-05 NOTE — Inpatient Diabetes Management (Addendum)
Inpatient Diabetes Program Recommendations  AACE/ADA: New Consensus Statement on Inpatient Glycemic Control (2015)  Target Ranges:  Prepandial:   less than 140 mg/dL      Peak postprandial:   less than 180 mg/dL (1-2 hours)      Critically ill patients:  140 - 180 mg/dL   Lab Results  Component Value Date   GLUCAP 137 (H) 05/05/2023   HGBA1C 7.6 (H) 03/17/2023    Review of Glycemic Control  Diabetes history: DM2 Outpatient Diabetes medications: Trulicity 0.75 every Wednesday Current orders for Inpatient glycemic control: Transitioned off of IV insulin to Novolog 0-9 units TID and 0-5 units at bedtime  Glucose was 777 mg/dL at cancer center.  Has been taking dexamethasone daily during radiation.    Inpatient Diabetes Program Recommendations:    Please consider:  Semglee 14 units every day (0.2 units x 65.7 kg)  Will see patient today and teach insulin.    Lantus and Novolog pens preferred at $35 each.  Addendum@10 :76:  Spoke with patient and family at bedside.  Per MD, steroids are to continue for 7 days.  Placed a Freestyle Libre 3 CGM to the back of his left arm.  MD prefers to send him home on oral DM medications.  Might consider-Amaryl 2 mg QAM with breakfast and Metformin 500 mg BID.  Last A1C was 7.6 % in July.  His blood sugars will likely normalize once steroids are complete.    Spoke with patient and family about Amaryl and Metformin, side effects, when to take and to take with a meal.  We discussed hypoglycemia, signs, symptoms and treatments.  Educated them on the [employer] and provided them with an extra sensor for when this one expires.  They should call Dr. Beverely Low, PCP if glucose trends become consistently > 200 mg/dL.  They verbalize understanding.    Will continue to follow while inpatient.  Thank you, Dulce Sellar, MSN, CDCES Diabetes Coordinator Inpatient Diabetes Program [phone] (team pager from 8a-5p)

## 2023-05-05 NOTE — Radiation Completion Notes (Addendum)
  Radiation Oncology         [phone] ________________________________  Name: Delshawn Cokley MRN: [id number]  Date: 05/04/2023  DOB: Jan 26, 1941  Referring Physician: Neena Rhymes, M.D. Date of Service: 2023-05-05 Radiation Oncologist: Margaretmary Bayley, M.D. 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   Intent: Palliative     ==========DELIVERED PLANS==========  First Treatment Date: 2023-04-20 - Last Treatment Date: 2023-05-04   Plan Name: [id number] Site: Cervical Spine Technique: 3D Mode: Photon Dose Per Fraction: 3 Gy Prescribed Dose (Delivered / Prescribed): 30 Gy / 30 Gy Prescribed Fxs (Delivered / Prescribed): 10 / 10     ==========ON TREATMENT VISIT DATES========== 2023-04-22, 2023-04-29   See weekly On Treatment Notes in Epic for details.  He tolerated the radiation treatments relatively well with some modest fatigue.  The patient will receive a call in about one month from the radiation oncology department. He will continue follow up with his medical oncologist, Dr. [name], as well.  ------------------------------------------------   Margaretmary Dys, MD [facility]  Radiation Oncology Direct Dial: [phone]  Fax: [phone] [email or web address]  Skype  LinkedIn

## 2023-05-05 NOTE — Discharge Summary (Addendum)
Physician Discharge Summary   Patient: Wayne Perkins MRN: [id number] DOB: 09-19-1940  Admit date:     05/04/2023  Discharge date: 05/05/23  Discharge Physician: Marguerita Merles, DO   PCP: Sheliah Hatch, MD   Recommendations at discharge:   Follow up with PCP within 1-2 weeks and repeat CBC,CMP,Mag,Phos within 1 week Follow up with Radiation Oncology within 1-2 weeks Follow up with Medical Oncology within 1-2 weeks  Discharge Diagnoses: Principal Problem:   Steroid-induced hyperglycemia Active Problems:   Hyperglycemia  Resolved Problems:   * No resolved hospital problems. [facility] Course: The patient is an 82 year old elderly chronically ill-appearing Caucasian male with a past medical history significant for arrival to metastatic prostate cancer, diabetes mellitus type 2, as well as other comorbidities who presented to ED from the cancer center due to abnormal labs and was found to have significantly elevated blood sugar of serum glucose of 777.  He endorsed that he has been on prednisone since receiving radiation and is currently being weaned off of steroids and doses just been reduced.  He reported no subjective fevers or chills but family thinks he has been extremely weak.  In the ED started on IV fluid hydration and given an insulin drip for steroid-induced hyperglycemia.  TRH was asked to admit this patient and he was quickly weaned off of the insulin drip.  PT OT evaluated recommending home health.  His blood sugars improved improved significantly diabetes education coordinator evaluated and recommended a sulfonylurea but patient has a allergy so we will discharge him with just a SGLT2 inhibitor short-term and have him follow-up with his PCP.  He is stable for discharge and wants to go home and PT OT recommended home health which will be arranged for the patient.  Prior to his discharge his labs were checked and his labs have stabilized but his phosphorus was 1.2 so was  replete with IV K-Phos.  He will need to follow-up with his PCP and continue blood sugar monitoring carefully and continue further evaluation and treatment with his medical oncologist and radiation oncologist.  Assessment and Plan:  Steroid-induced hyperglycemia in the setting of type 2 diabetes, improved -Sent from cancer center with serum glucose of 777 -No evidence of metabolic acidosis on lab work. -Started on insulin drip in the ED, continued and weaned and doing well and steroids have been reduced and weaning so we will discharge him on SGLT2 inhibitor with Marcelline Deist for 7 days given his allergy and reaction to glipizide's and sulfonylureas -Continue IV fluid hydration -Last hemoglobin A1c 7.6 on 03/17/2023 -Diabetes coordinator consulted for patient's education and will also give him 500 mg of metformin twice daily   Pseudohyponatremia -Corrected sodium 128 for hyperglycemia 777 is calculated 139 -Continue LR for IV fluid hydration and is improved -Na+ Trend: Recent Labs  Lab 04/16/23 1104 05/04/23 1356 05/04/23 1809 05/05/23 0303  NA 135 128* 129* 136  -Continue to Monitor and Trend and repeat CMP within 1 week   Hypokalemia -Patient's K+ Level Trend: Recent Labs  Lab 04/16/23 1104 05/04/23 1356 05/04/23 1809 05/05/23 0303  K 3.8 4.1 3.3* 3.6  -Replete with IV K Phos 30 mmol today -Continue to Monitor and Replete as Necessary -Repeat CMP in the AM   Bigeminy -Intermittent and sustained for 2 minutes -No chest pain, palpitations shortness of breath or symptoms -Follow-up with Dr. Janne Napoleon of cardiology and I discussed with Dr. Was the only while patient was hospitalized and he recommends no Holter  monitor for discharge at this time  Sore Throat -In the setting of his radiation -Continue with phenol spray and have outpatient SLP evaluation  Generalized Weakness and Physical Debility -PT OT assessment and recommending Home Health -Fall precautions    Hyperlipidemia -Resume home statin   Vitamin B12 Deficiency -Resume home B12 supplement  Malignant Neoplasm of the Prostate with Metastatic Disease  -Continue Outpatient follow up with PCP, Medical Oncology and Radiation Oncology  CKD Stage 3a -BUN/Cr Trend: Recent Labs  Lab 04/16/23 1104 05/04/23 1356 05/04/23 1809 05/05/23 0303  BUN 51* 54* 54* 45*  CREATININE 1.41* 1.65* 1.55* 1.31*  -Avoid Nephrotoxic Medications, Contrast Dyes, Hypotension and Dehydration to Ensure Adequate Renal Perfusion and will need to Renally Adjust Meds -Continue to Monitor and Trend Renal Function carefully and repeat CMP in the AM   Normocytic Anemia/Anemia of Chronic Disease  -Hgb/Hct Trend: Recent Labs  Lab 04/15/23 1450 04/16/23 1104 05/04/23 1356 05/04/23 1617 05/04/23 2345 05/05/23 0303  HGB 10.0* 9.5* 9.7* 9.8* 9.4* 9.2*  HCT 29.2* 28.1* 29.7* 30.0* 27.7* 27.5*  MCV 93.0 92.7 94.9 94.6 92.3 93.2  -Check Anemia Panel in the AM -Continue to Monitor for S/Sx of Bleeding; No overt bleeding noted -Repeat CBC in the AM   Hypophosphatemia -Phos Level Trend: Recent Labs  Lab 05/05/23 0303  PHOS 1.2*  -Replete with IV K Phos 30 mmol -Continue to Monitor and Replete as Necessary -Repeat Phos Level in the AM  Hypoalbuminemia -Patient's Albumin Trend: Recent Labs  Lab 05/04/23 1356 05/05/23 0303  ALBUMIN 3.5 2.7*  -Continue to Monitor and Trend and repeat CMP in the AM  Consultants: Diabetes Education Coordinator Procedures performed: As delineated as above Disposition: Home health Diet recommendation:  Discharge Diet Orders (From admission, onward)     Start     Ordered   05/05/23 0000  Diet - low sodium heart healthy        05/05/23 1654   05/05/23 0000  Diet Carb Modified        05/05/23 1654           Cardiac and Carb modified diet DISCHARGE MEDICATION: Allergies as of 05/05/2023       Reactions   Tramadol Other (See Comments)   Muscle pains and sensation of  needles in muscles    Dexamethasone Other (See Comments)   Elevated BGL into the 700's   Glipizide Itching   Statins Other (See Comments)   Causes narcolepsy        Medication List     TAKE these medications    Accu-Chek Aviva Soln   B-D SINGLE USE SWABS REGULAR Pads   blood glucose meter kit and supplies Use once a day in the AM for fasting sugars.   calcium-vitamin D 500-200 MG-UNIT tablet Commonly known as: OSCAL WITH D Take 1 tablet by mouth 2 (two) times daily.   clopidogrel 75 MG tablet Commonly known as: PLAVIX TAKE 1 TABLET EVERY DAY What changed: when to take this   cyanocobalamin 1000 MCG tablet Commonly known as: VITAMIN B12 Take 1,000 mcg by mouth daily.   dapagliflozin propanediol 10 MG Tabs tablet Commonly known as: Farxiga Take 1 tablet (10 mg total) by mouth daily before breakfast.   dexamethasone 2 MG tablet Commonly known as: DECADRON Take 1 tablet (2 mg total) by mouth daily for 7 days.   DULoxetine 30 MG capsule Commonly known as: CYMBALTA Take 1 capsule (30 mg total) by mouth daily.   Ez Smart Blood Glucose  Lancets Misc by Does not apply route.   glucose blood test strip Use as instructed   hydrochlorothiazide 25 MG tablet Commonly known as: HYDRODIURIL Take 1 tablet (25 mg total) by mouth daily.   hydrOXYzine 10 MG tablet Commonly known as: ATARAX TAKE 1 TABLET BY MOUTH 3 TIMES DAILY AS NEEDED FOR ITCHING. What changed:  how much to take how to take this when to take this reasons to take this additional instructions   isosorbide mononitrate 30 MG 24 hr tablet Commonly known as: IMDUR Take 1 tablet (30 mg total) by mouth daily. What changed:  when to take this reasons to take this   metFORMIN 500 MG tablet Commonly known as: GLUCOPHAGE Take 1 tablet (500 mg total) by mouth 2 (two) times daily with a meal.   MULTI-DAY VITAMINS PO Take 1 tablet by mouth daily with breakfast.   NIFEdipine 90 MG 24 hr tablet Commonly  known as: PROCARDIA XL/NIFEDICAL-XL Take 1 tablet (90 mg total) by mouth daily.   nitroGLYCERIN 0.4 MG SL tablet Commonly known as: NITROSTAT Place 1 tablet (0.4 mg total) under the tongue every 5 (five) minutes as needed for chest pain.   oxyCODONE 5 MG immediate release tablet Commonly known as: Oxy IR/ROXICODONE Take 1 tablet (5 mg total) by mouth every 6 (six) hours as needed for severe pain. What changed: when to take this   polyethylene glycol 17 g packet Commonly known as: MIRALAX / GLYCOLAX Take 8.5 g by mouth in the morning and at bedtime.   potassium chloride SA 20 MEQ tablet Commonly known as: KLOR-CON M Take 1 tablet (20 mEq total) by mouth 2 (two) times daily.   rosuvastatin 5 MG tablet Commonly known as: CRESTOR TAKE 1 TABLET EVERY DAY   Trulicity 0.75 MG/0.5ML Sopn Generic drug: Dulaglutide Inject 0.75 mg into the skin every Wednesday.   Tylenol 325 MG Caps Generic drug: Acetaminophen Take 325-650 mg by mouth every 6 (six) hours as needed (for pain).   Vitamin D3 50 MCG (2000 UT) Tabs Take 2,000 Units by mouth daily.        Follow-up Information     [facility] Follow up.   Specialty: Home Health Services Why: A representative with [facility] will contact you within 24-48 hours from discharge re: home health physical therapy, occupational therapy, speech language pathologist, and home health aide services. Contact information: [address] [place] [address] [phone]                Discharge Exam: Ceasar Mons Weights   05/04/23 1542  Weight: 65.7 kg   Vitals:   05/05/23 1000 05/05/23 1144  BP: 123/61 (!) 131/95  Pulse: 94 (!) 49  Resp: 16 16  Temp:  99 F (37.2 C)  SpO2: 97% 97%   Examination: Physical Exam:  Constitutional: WN/WD elderly chronically ill-appearing Caucasian male in no acute distress Respiratory: Diminished to auscultation bilaterally, no wheezing, rales, rhonchi or crackles. Normal  respiratory effort and patient is not tachypenic. No accessory muscle use.  Cardiovascular: RRR, no murmurs / rubs / gallops. S1 and S2 auscultated. No extremity edema  Abdomen: Soft, non-tender, non-distended. Bowel sounds positive.  GU: Deferred. Musculoskeletal: No clubbing / cyanosis of digits/nails. No joint deformity upper and lower extremities.  Skin: No rashes, lesions, ulcers on the skin evaluation Neurologic: CN 2-12 grossly intact with no focal deficits. Romberg sign and cerebellar reflexes not assessed.  Psychiatric: Normal judgment and insight. Alert and oriented x 3. Normal  mood and appropriate affect.   Condition at discharge: stable  The results of significant diagnostics from this hospitalization (including imaging, microbiology, ancillary and laboratory) are listed below for reference.   Imaging Studies: MR Cervical Spine Wo Contrast  Result Date: 04/15/2023 CLINICAL DATA:  Metastatic disease evaluation. EXAM: MRI CERVICAL SPINE WITHOUT CONTRAST TECHNIQUE: Multiplanar, multisequence MR imaging of the cervical spine was performed. No intravenous contrast was administered. COMPARISON:  Head CT 08/09/2022.  CT cervical spine 07/31/2019. FINDINGS: Alignment: 4 mm anterolisthesis of C4 on C5. Vertebrae: Diffusely low marrow signal, consistent with known metastatic prostate cancer. Dorsal epidural extension of tumor from C2-3 through at least C6-7. Cord: Normal spinal cord signal and volume. Posterior Fossa, vertebral arteries, paraspinal tissues: Dorsal paraspinal extension of tumor along the C4 spinous process. Disc levels: C2-C3: Disc bulge, ligamentum flavum buckling and epidural tumor results in moderate spinal canal stenosis. Facet arthropathy and uncovertebral joint spurring results in moderate bilateral neural foraminal narrowing. C3-C4: Dorsal epidural tumor results in moderate spinal canal stenosis. Facet arthropathy and uncovertebral joint spurring contribute to moderate  bilateral neural foraminal narrowing. C4-C5: Disc bulge, ligamentum flavum buckling and dorsal epidural tumor results in moderate-to-severe spinal canal stenosis. Facet arthropathy and uncovertebral joint spurring contribute to severe bilateral neural foraminal narrowing. C5-C6: Disc-osteophyte complex and dorsal epidural tumor results in mild-to-moderate spinal canal stenosis. Facet arthropathy and uncovertebral joint spurring results in severe left and moderate right neural foraminal narrowing. C6-C7: Disc bulge, ligamentum flavum buckling and dorsal epidural tumor results in severe spinal canal stenosis. Uncovertebral joint spurring contributes to moderate left neural foraminal narrowing. C7-T1: Moderate bilateral facet arthropathy. No spinal canal stenosis or neural foraminal narrowing. IMPRESSION: 1. Diffusely low marrow signal, consistent with known metastatic prostate cancer. Dorsal epidural extension of tumor from C2-3 through at least C6-7, with associated multilevel spinal canal stenosis, severe at C6-7 and moderate-to-severe at C4-5. Postcontrast imaging would better delineate the extent of epidural tumor. 2. Multilevel neural foraminal narrowing, severe bilaterally at C4-5 and on the left at C5-6. Electronically Signed   By: Orvan Falconer M.D.   On: 04/15/2023 17:07    Microbiology: Results for orders placed or performed during the hospital encounter of 05/04/23  MRSA Next Gen by PCR, Nasal     Status: None   Collection Time: 05/05/23  1:06 AM   Specimen: Nasal Mucosa; Nasal Swab  Result Value Ref Range Status   MRSA by PCR Next Gen NOT DETECTED NOT DETECTED Final    Comment: (NOTE) The GeneXpert MRSA Assay (FDA approved for NASAL specimens only), is one component of a comprehensive MRSA colonization surveillance program. It is not intended to diagnose MRSA infection nor to guide or monitor treatment for MRSA infections. Test performance is not FDA approved in patients less than 62  years old. Performed at [facility], [address], [place], [place] [address]    Labs: CBC: Recent Labs  Lab 05/04/23 1356 05/04/23 1617 05/04/23 2345 05/05/23 0303  WBC 5.7 5.5 5.1 5.9  NEUTROABS 4.8 4.8  --   --   HGB 9.7* 9.8* 9.4* 9.2*  HCT 29.7* 30.0* 27.7* 27.5*  MCV 94.9 94.6 92.3 93.2  PLT 172 179 162 155   Basic Metabolic Panel: Recent Labs  Lab 05/04/23 1356 05/04/23 1809 05/05/23 0303  NA 128* 129* 136  K 4.1 3.3* 3.6  CL 86* 87* 97*  CO2 30 27 26   GLUCOSE 777* 484* 139*  BUN 54* 54* 45*  CREATININE 1.65* 1.55* 1.31*  CALCIUM 9.2 9.0 9.1  MG  --   --  2.0  PHOS  --   --  1.2*   Liver Function Tests: Recent Labs  Lab 05/04/23 1356 05/05/23 0303  AST 20 29  ALT 18 20  ALKPHOS 175* 138*  BILITOT 0.5 0.5  PROT 6.4* 5.8*  ALBUMIN 3.5 2.7*   CBG: Recent Labs  Lab 05/05/23 0323 05/05/23 0427 05/05/23 0527 05/05/23 0755 05/05/23 1155  GLUCAP 132* 133* 144* 137* 265*   Discharge time spent: greater than 30 minutes.  Signed: Marguerita Merles, DO [facility] 05/07/2023

## 2023-05-05 NOTE — Care Management (Addendum)
This RNCM received secure chat from charge RN re: assistance with late discharge need for [facility] services.  This RNCM spoke with patient's wife Wayne Perkins to offer choice. Wayne Perkins chose and [facility] agency that will accept insurance.   Notified Kelly with [facility] who will follow patient post discharge. Updated AVS. Notified RN No additional TOC needs at this time.

## 2023-05-05 NOTE — Evaluation (Signed)
Physical Therapy One Time Evaluation Patient Details Name: Wayne Perkins MRN: [id number] DOB: 09-30-1940 Today's Date: 05/05/2023  History of Present Illness  Pt is an 82 y/o male admitted on 05/04/23 after presenting from the [facility] center 2/2 abnormal labs & found to be severely hyperglycemic. Pt is being treated for steroid-induced hyperglycemia. PMH: metastatic prostate CA, DM2, childhood asthma, heart disease, stroke  Clinical Impression  Patient evaluated by Physical Therapy with no further acute PT needs identified. All education has been completed and the patient has no further questions.   PT requested to work with pt as pt wishing to d/c home today and okay to see per MD (had EKG prior to visit).  Pt reports overall generalized weakness however was able to ambulate 80 feet in hallway with RW for safety/support.  Pt reports furniture walking at home lately and also stating he was becoming weaker from radiation treatments prior to this admission.  Pt agreeable to use RW upon d/c so recommend RW for home as well as f/u with HHPT, and pt is agreeable to both at this time (RN notified).  PT is signing off. Thank you for this referral.       If plan is discharge home, recommend the following: Help with stairs or ramp for entrance;Assistance with cooking/housework;Assist for transportation   Can travel by private vehicle        Equipment Recommendations Rolling walker (2 wheels)  Recommendations for Other Services       Functional Status Assessment Patient has had a recent decline in their functional status and demonstrates the ability to make significant improvements in function in a reasonable and predictable amount of time.     Precautions / Restrictions Precautions Precautions: Fall      Mobility  Bed Mobility Overal bed mobility: Modified Independent                  Transfers Overall transfer level: Needs assistance Equipment used: Rolling walker (2 wheels) Transfers: Sit  to/from Stand Sit to Stand: Contact guard assist           General transfer comment: cues for self assist    Ambulation/Gait Ambulation/Gait assistance: Contact guard assist Gait Distance (Feet): 80 Feet Assistive device: Rolling walker (2 wheels) Gait Pattern/deviations: Step-through pattern, Decreased stride length, Trunk flexed Gait velocity: decr     General Gait Details: cues for RW positioning, pt just reports generalized weakness, distance to tolerance  Stairs            Wheelchair Mobility     Tilt Bed    Modified Rankin (Stroke Patients Only)       Balance Overall balance assessment: Mild deficits observed, not formally tested (denies any recent falls)                                           Pertinent Vitals/Pain Pain Assessment Pain Assessment: No/denies pain    Home Living Family/patient expects to be discharged to:: Private residence Living Arrangements: Spouse/significant other   Type of Home: House Home Access: Level entry       Home Layout: One level Home Equipment: Cane - single point      Prior Function Prior Level of Function : Independent/Modified Independent                     Extremity/Trunk Assessment  Lower Extremity Assessment Lower Extremity Assessment: Generalized weakness    Cervical / Trunk Assessment Cervical / Trunk Assessment: Kyphotic;Other exceptions Cervical / Trunk Exceptions: forward head posture  Communication   Communication Communication: No apparent difficulties  Cognition Arousal: Alert Behavior During Therapy: WFL for tasks assessed/performed Overall Cognitive Status: Within Functional Limits for tasks assessed                                          General Comments      Exercises     Assessment/Plan    PT Assessment All further PT needs can be met in the next venue of care  PT Problem List Decreased mobility;Decreased activity  tolerance;Decreased strength;Decreased knowledge of use of DME       PT Treatment Interventions      PT Goals (Current goals can be found in the Care Plan section)  Acute Rehab PT Goals PT Goal Formulation: With patient/family Time For Goal Achievement: 05/19/23 Potential to Achieve Goals: Good    Frequency       Co-evaluation               AM-PAC PT "6 Clicks" Mobility  Outcome Measure Help needed turning from your back to your side while in a flat bed without using bedrails?: A Little Help needed moving from lying on your back to sitting on the side of a flat bed without using bedrails?: A Little Help needed moving to and from a bed to a chair (including a wheelchair)?: A Little Help needed standing up from a chair using your arms (e.g., wheelchair or bedside chair)?: A Little Help needed to walk in hospital room?: A Little Help needed climbing 3-5 steps with a railing? : A Little 6 Click Score: 18    End of Session Equipment Utilized During Treatment: Gait belt Activity Tolerance: Patient tolerated treatment well Patient left: with call bell/phone within reach;in bed;with family/visitor present Nurse Communication: Mobility status PT Visit Diagnosis: Difficulty in walking, not elsewhere classified (R26.2);Muscle weakness (generalized) (M62.81)    Time: 1886-7737 PT Time Calculation (min) (ACUTE ONLY): 14 min   Charges:   PT Evaluation $PT Eval Low Complexity: 1 Low   PT General Charges $$ ACUTE PT VISIT: 1 Visit        Thomasene Mohair PT, DPT Physical Therapist Acute Rehabilitation Services Office: [phone]   [name] 05/05/2023, 4:29 PM

## 2023-05-05 NOTE — Care Management CC44 (Signed)
Condition Code 44 Documentation Completed  Patient Details  Name: Wayne Perkins MRN: [id number] Date of Birth: November 19, 1940   Condition Code 44 given:  Yes Patient signature on Condition Code 44 notice:  Yes Documentation of 2 MD's agreement:  Yes Code 44 added to claim:  Yes    Armanda Heritage, RN 05/05/2023, 4:48 PM

## 2023-05-06 ENCOUNTER — Telehealth: Payer: Self-pay

## 2023-05-06 ENCOUNTER — Other Ambulatory Visit: Payer: Self-pay

## 2023-05-06 ENCOUNTER — Inpatient Hospital Stay: Payer: Medicare HMO | Admitting: Licensed Clinical Social Worker

## 2023-05-06 ENCOUNTER — Other Ambulatory Visit (HOSPITAL_COMMUNITY): Payer: Self-pay

## 2023-05-06 DIAGNOSIS — C7951 Secondary malignant neoplasm of bone: Secondary | ICD-10-CM

## 2023-05-06 DIAGNOSIS — C61 Malignant neoplasm of prostate: Secondary | ICD-10-CM

## 2023-05-06 LAB — HEMOGLOBIN A1C
Hgb A1c MFr Bld: 12.9 % — ABNORMAL HIGH (ref 4.8–5.6)
Mean Plasma Glucose: 324 mg/dL

## 2023-05-06 NOTE — Progress Notes (Signed)
[facility] CSW Progress Note  Clinical Child psychotherapist  received a call from pt's wife inquiring about home care.  Pt was discharged from the hospital yesterday and pt's wife was unsure if the cancer center would set up home care or the hospital.  CSW informed pt's wife it appears from the notes a home care referral was sent to [facility].  CSW encouraged wife to call to verify.  Per pt's wife pt would greatly benefit from home care at this time and may need to order DME.  CSW to remain available to provide support as appropriate throughout duration of treatment.        Rachel Moulds, LCSW Clinical Social Worker [facility]

## 2023-05-06 NOTE — Telephone Encounter (Signed)
This RNCM received voicemail message from patient's wife regarding HH services. This RNCM spoke with patient's wife Talbert Forest to advise information was attached to discharge paperwork and explained a representative with [facility] will give a call within 24-48 hours from discharge to discuss HHPT/OT, SLP, HHA services and schedule a time to visit. Patient's wife questioning services since they are social security wants to make sure insurance will cover. This RNCM advised to discuss further w/ [facility] as they will provide services and can give information on cost of their services. Talbert Forest verbalized understanding.   No additional TOC needs.

## 2023-05-07 ENCOUNTER — Encounter: Payer: Self-pay | Admitting: Hematology

## 2023-05-08 DIAGNOSIS — T387X5D Adverse effect of androgens and anabolic congeners, subsequent encounter: Secondary | ICD-10-CM | POA: Diagnosis not present

## 2023-05-08 DIAGNOSIS — E785 Hyperlipidemia, unspecified: Secondary | ICD-10-CM | POA: Diagnosis not present

## 2023-05-08 DIAGNOSIS — E538 Deficiency of other specified B group vitamins: Secondary | ICD-10-CM | POA: Diagnosis not present

## 2023-05-08 DIAGNOSIS — D631 Anemia in chronic kidney disease: Secondary | ICD-10-CM | POA: Diagnosis not present

## 2023-05-08 DIAGNOSIS — N1832 Chronic kidney disease, stage 3b: Secondary | ICD-10-CM | POA: Diagnosis not present

## 2023-05-08 DIAGNOSIS — K5900 Constipation, unspecified: Secondary | ICD-10-CM | POA: Diagnosis not present

## 2023-05-08 DIAGNOSIS — E1122 Type 2 diabetes mellitus with diabetic chronic kidney disease: Secondary | ICD-10-CM | POA: Diagnosis not present

## 2023-05-08 DIAGNOSIS — I1310 Hypertensive heart and chronic kidney disease without heart failure, with stage 1 through stage 4 chronic kidney disease, or unspecified chronic kidney disease: Secondary | ICD-10-CM | POA: Diagnosis not present

## 2023-05-08 DIAGNOSIS — E1165 Type 2 diabetes mellitus with hyperglycemia: Secondary | ICD-10-CM | POA: Diagnosis not present

## 2023-05-09 ENCOUNTER — Telehealth: Payer: Self-pay | Admitting: Family Medicine

## 2023-05-09 ENCOUNTER — Other Ambulatory Visit: Payer: Self-pay

## 2023-05-09 ENCOUNTER — Other Ambulatory Visit (HOSPITAL_COMMUNITY): Payer: Self-pay

## 2023-05-09 ENCOUNTER — Telehealth: Payer: Self-pay

## 2023-05-09 ENCOUNTER — Other Ambulatory Visit: Payer: Self-pay | Admitting: Radiology

## 2023-05-09 DIAGNOSIS — C61 Malignant neoplasm of prostate: Secondary | ICD-10-CM

## 2023-05-09 MED ORDER — SUCRALFATE 1 GM/10ML PO SUSP
1.0000 g | Freq: Three times a day (TID) | ORAL | 0 refills | Status: DC
Start: 2023-05-09 — End: 2023-05-25

## 2023-05-09 NOTE — Telephone Encounter (Signed)
Caller name: Trevaris Strenger  On DPR?: Yes  Call back number: [phone] (home)  Provider they see: Sheliah Hatch, MD  Reason for call:   Pt was called back to make a hosp follow -up visit. Was prescribed dapagliflozin propanediol Marcelline Deist) metFORMIN (GLUCOPHAGE) at visit.

## 2023-05-09 NOTE — Telephone Encounter (Signed)
Attempted to call pt and the VM is full unable to leave a message typically transition of care team makes the pt hospital follow up

## 2023-05-09 NOTE — Telephone Encounter (Signed)
Per Joyice Faster, PA-C RN called Mr. Cannady to inform him of new prescription Carafate to help with sore throat sent in to his pharmacy and directions on how to take medication.  Left message but also called Mrs. Ninneman as well and spoke with her about medication and how he should take it.

## 2023-05-10 ENCOUNTER — Telehealth: Payer: Self-pay | Admitting: Family Medicine

## 2023-05-10 DIAGNOSIS — E1122 Type 2 diabetes mellitus with diabetic chronic kidney disease: Secondary | ICD-10-CM | POA: Diagnosis not present

## 2023-05-10 DIAGNOSIS — I1310 Hypertensive heart and chronic kidney disease without heart failure, with stage 1 through stage 4 chronic kidney disease, or unspecified chronic kidney disease: Secondary | ICD-10-CM | POA: Diagnosis not present

## 2023-05-10 DIAGNOSIS — K5900 Constipation, unspecified: Secondary | ICD-10-CM | POA: Diagnosis not present

## 2023-05-10 DIAGNOSIS — E785 Hyperlipidemia, unspecified: Secondary | ICD-10-CM | POA: Diagnosis not present

## 2023-05-10 DIAGNOSIS — E1165 Type 2 diabetes mellitus with hyperglycemia: Secondary | ICD-10-CM | POA: Diagnosis not present

## 2023-05-10 DIAGNOSIS — E538 Deficiency of other specified B group vitamins: Secondary | ICD-10-CM | POA: Diagnosis not present

## 2023-05-10 DIAGNOSIS — N1832 Chronic kidney disease, stage 3b: Secondary | ICD-10-CM | POA: Diagnosis not present

## 2023-05-10 DIAGNOSIS — T387X5D Adverse effect of androgens and anabolic congeners, subsequent encounter: Secondary | ICD-10-CM | POA: Diagnosis not present

## 2023-05-10 DIAGNOSIS — D631 Anemia in chronic kidney disease: Secondary | ICD-10-CM | POA: Diagnosis not present

## 2023-05-10 NOTE — Telephone Encounter (Signed)
Home Health Verbal Orders  Agency: [facility]   Caller: [facility]  Call back #: [phone] Fax #:    Requesting OT:    Reason for Request:    Frequency: 1wk4

## 2023-05-10 NOTE — Telephone Encounter (Signed)
Okay for home health orders

## 2023-05-11 ENCOUNTER — Telehealth (INDEPENDENT_AMBULATORY_CARE_PROVIDER_SITE_OTHER): Payer: Medicare HMO | Admitting: Family Medicine

## 2023-05-11 ENCOUNTER — Encounter: Payer: Self-pay | Admitting: Family Medicine

## 2023-05-11 ENCOUNTER — Telehealth: Payer: Self-pay | Admitting: Family Medicine

## 2023-05-11 VITALS — Ht 70.0 in | Wt 145.0 lb

## 2023-05-11 DIAGNOSIS — T380X5A Adverse effect of glucocorticoids and synthetic analogues, initial encounter: Secondary | ICD-10-CM | POA: Diagnosis not present

## 2023-05-11 DIAGNOSIS — C61 Malignant neoplasm of prostate: Secondary | ICD-10-CM | POA: Diagnosis not present

## 2023-05-11 DIAGNOSIS — R739 Hyperglycemia, unspecified: Secondary | ICD-10-CM | POA: Diagnosis not present

## 2023-05-11 DIAGNOSIS — T387X5D Adverse effect of androgens and anabolic congeners, subsequent encounter: Secondary | ICD-10-CM | POA: Diagnosis not present

## 2023-05-11 DIAGNOSIS — I1310 Hypertensive heart and chronic kidney disease without heart failure, with stage 1 through stage 4 chronic kidney disease, or unspecified chronic kidney disease: Secondary | ICD-10-CM | POA: Diagnosis not present

## 2023-05-11 DIAGNOSIS — E1122 Type 2 diabetes mellitus with diabetic chronic kidney disease: Secondary | ICD-10-CM | POA: Diagnosis not present

## 2023-05-11 DIAGNOSIS — E1165 Type 2 diabetes mellitus with hyperglycemia: Secondary | ICD-10-CM | POA: Diagnosis not present

## 2023-05-11 DIAGNOSIS — C7951 Secondary malignant neoplasm of bone: Secondary | ICD-10-CM | POA: Diagnosis not present

## 2023-05-11 DIAGNOSIS — E538 Deficiency of other specified B group vitamins: Secondary | ICD-10-CM | POA: Diagnosis not present

## 2023-05-11 DIAGNOSIS — E785 Hyperlipidemia, unspecified: Secondary | ICD-10-CM | POA: Diagnosis not present

## 2023-05-11 DIAGNOSIS — K5900 Constipation, unspecified: Secondary | ICD-10-CM | POA: Diagnosis not present

## 2023-05-11 DIAGNOSIS — D631 Anemia in chronic kidney disease: Secondary | ICD-10-CM | POA: Diagnosis not present

## 2023-05-11 DIAGNOSIS — N1832 Chronic kidney disease, stage 3b: Secondary | ICD-10-CM | POA: Diagnosis not present

## 2023-05-11 NOTE — Telephone Encounter (Signed)
Ok to provide verbal HH orders

## 2023-05-11 NOTE — Telephone Encounter (Signed)
Ok for verbal orders ?

## 2023-05-11 NOTE — Telephone Encounter (Signed)
Home Health Verbal Orders  Agency: [facility]  Caller: Debby Bud Call back: [phone] Fax #:    Requesting :PT  Reason for Request:    Frequency:   2wk1, 1wk7

## 2023-05-11 NOTE — Telephone Encounter (Signed)
Spoke to [place] at home health and gave the ok for Frequency:   2wk1, 1wk7   from Dr Beverely Low

## 2023-05-11 NOTE — Addendum Note (Signed)
Addended by: Sheliah Hatch on: 05/11/2023 12:56 PM   Modules accepted: Orders

## 2023-05-11 NOTE — Progress Notes (Signed)
Virtual Visit via Video   I connected with patient on 05/11/23 at 11:00 AM EDT by a video enabled telemedicine application and verified that I am speaking with the correct person using two identifiers.  Location patient: Home Location provider: [facility] Persons participating in the virtual visit: Patient, Provider, CMA Sheryle Hail C)  I discussed the limitations of evaluation and management by telemedicine and the availability of in person appointments. The patient expressed understanding and agreed to proceed.  Subjective:   HPI:   Hospital f/u- pt was admitted 9/4-9/5 due to a sugar of 777.  He had been on prednisone for cancer related bone pain.  Was started on insulin drip and was able to be weaned quickly.  He was d/c'd on SGLT2 inhibitor and told to f/u as an outpt.  PT/OT recommended home health.  His phosphorus was low and needed to be repleted w/ IV K-phos on day of d/c.  His Marcelline Deist was only for 7 days and he was started on Metformin 500mg  BID.  Sodium had normalized prior to d/c.  He complained of a sore throat s/p radiation and was instructed to f/u w/ [facility] outpt.  Dr Kathrynn Running provided Carafate.  Denies having pain at this time.  Has f/u oncology appt on 9/25.  ROS:   See pertinent positives and negatives per HPI.  Patient Active Problem List   Diagnosis Date Noted   Hyperglycemia 05/05/2023   Steroid-induced hyperglycemia 05/04/2023   Neck pain 04/14/2023   Anemia 01/21/2023   Physical exam 10/30/2021   CKD (chronic kidney disease) stage 3, GFR 30-59 ml/min (HCC) 08/15/2020   Prostate cancer (HCC) 02/19/2020   Acute ischemic stroke (HCC) 07/31/2019   HLD (hyperlipidemia) 07/31/2019   Malignant neoplasm of prostate metastatic to bone (HCC) 07/31/2019   Hypertension, essential 01/24/2019   TIA (transient ischemic attack) 01/24/2019   CAD (coronary artery disease) 01/24/2019   Type 2 diabetes with nephropathy (HCC) 01/24/2019   Macular degeneration  01/24/2019    Social History   Tobacco Use   Smoking status: Never   Smokeless tobacco: Never   Tobacco comments:    as a teeenager  Substance Use Topics   Alcohol use: Not Currently    Current Outpatient Medications:    Alcohol Swabs (B-D SINGLE USE SWABS REGULAR) PADS, , Disp: , Rfl:    Blood Glucose Calibration (ACCU-CHEK AVIVA) SOLN, , Disp: , Rfl:    blood glucose meter kit and supplies, Use once a day in the AM for fasting sugars., Disp: 1 each, Rfl: 0   calcium-vitamin D (OSCAL WITH D) 500-200 MG-UNIT tablet, Take 1 tablet by mouth 2 (two) times daily., Disp: 60 tablet, Rfl: 3   clopidogrel (PLAVIX) 75 MG tablet, TAKE 1 TABLET EVERY DAY (Patient taking differently: Take 75 mg by mouth in the morning.), Disp: 90 tablet, Rfl: 3   dapagliflozin propanediol (FARXIGA) 10 MG TABS tablet, Take 1 tablet (10 mg total) by mouth daily before breakfast., Disp: 7 tablet, Rfl: 0   Dulaglutide (TRULICITY) 0.75 MG/0.5ML SOPN, Inject 0.75 mg into the skin every Wednesday., Disp: 4 pen, Rfl: 0   DULoxetine (CYMBALTA) 30 MG capsule, Take 1 capsule (30 mg total) by mouth daily., Disp: 30 capsule, Rfl: 0   Ez Smart Blood Glucose Lancets MISC, by Does not apply route., Disp: 100 each, Rfl: 3   glucose blood test strip, Use as instructed, Disp: 100 each, Rfl: 3   hydrochlorothiazide (HYDRODIURIL) 25 MG tablet, Take 1 tablet (25 mg total)  by mouth daily., Disp: 90 tablet, Rfl: 3   hydrOXYzine (ATARAX) 10 MG tablet, TAKE 1 TABLET BY MOUTH 3 TIMES DAILY AS NEEDED FOR ITCHING. (Patient taking differently: Take 10 mg by mouth 3 (three) times daily as needed for itching.), Disp: 270 tablet, Rfl: 1   isosorbide mononitrate (IMDUR) 30 MG 24 hr tablet, Take 1 tablet (30 mg total) by mouth daily. (Patient taking differently: Take 30 mg by mouth daily as needed (for chest pain).), Disp: 90 tablet, Rfl: 3   metFORMIN (GLUCOPHAGE) 500 MG tablet, Take 1 tablet (500 mg total) by mouth 2 (two) times daily with a meal.,  Disp: 60 tablet, Rfl: 0   Multiple Vitamin (MULTI-DAY VITAMINS PO), Take 1 tablet by mouth daily with breakfast., Disp: , Rfl:    NIFEdipine (PROCARDIA XL/NIFEDICAL-XL) 90 MG 24 hr tablet, Take 1 tablet (90 mg total) by mouth daily., Disp: 90 tablet, Rfl: 3   nitroGLYCERIN (NITROSTAT) 0.4 MG SL tablet, Place 1 tablet (0.4 mg total) under the tongue every 5 (five) minutes as needed for chest pain., Disp: 25 tablet, Rfl: 3   oxyCODONE (OXY IR/ROXICODONE) 5 MG immediate release tablet, Take 1 tablet (5 mg total) by mouth every 6 (six) hours as needed for severe pain. (Patient taking differently: Take 5 mg by mouth at bedtime.), Disp: 45 tablet, Rfl: 0   polyethylene glycol (MIRALAX / GLYCOLAX) 17 g packet, Take 8.5 g by mouth in the morning and at bedtime., Disp: , Rfl:    potassium chloride SA (KLOR-CON M) 20 MEQ tablet, Take 1 tablet (20 mEq total) by mouth 2 (two) times daily., Disp: 180 tablet, Rfl: 3   rosuvastatin (CRESTOR) 5 MG tablet, TAKE 1 TABLET EVERY DAY, Disp: 90 tablet, Rfl: 3   sucralfate (CARAFATE) 1 GM/10ML suspension, Take 10 mLs (1 g total) by mouth 4 (four) times daily -  with meals and at bedtime., Disp: 420 mL, Rfl: 0   TYLENOL 325 MG CAPS, Take 325-650 mg by mouth every 6 (six) hours as needed (for pain)., Disp: , Rfl:    vitamin B-12 (CYANOCOBALAMIN) 1000 MCG tablet, Take 1,000 mcg by mouth daily., Disp: , Rfl:    Cholecalciferol (VITAMIN D3) 50 MCG (2000 UT) TABS, Take 2,000 Units by mouth daily. (Patient not taking: Reported on 05/11/2023), Disp: , Rfl:    dexamethasone (DECADRON) 2 MG tablet, Take 1 tablet (2 mg total) by mouth daily for 7 days. (Patient not taking: Reported on 05/11/2023), Disp: 7 tablet, Rfl: 0  Allergies  Allergen Reactions   Tramadol Other (See Comments)    Muscle pains and sensation of needles in muscles    Dexamethasone Other (See Comments)    Elevated BGL into the 700's   Glipizide Itching   Statins Other (See Comments)    Causes narcolepsy     Objective:   Ht 5\' 10"  (1.778 m)   Wt 145 lb (65.8 kg)   BMI 20.81 kg/m  AAOx3, NAD NCAT, EOMI No obvious CN deficits Coloring WNL Pt is able to speak clearly, coherently without shortness of breath or increased work of breathing.  Thought process is linear.  Mood is appropriate.   Assessment and Plan:   Steroid induced hyperglycemia- new.  Pt was admitted overnight as sugar was 777.  At the time he was asymptomatic.  Thankfully sugars responded to fluids and insulin drip.  He was weaned off the drip and started on Metformin 500mg  BID and Farxiga x7 days.  He reports his pain is much improved  since having radiation and he has been able to stop the steroids.  Only taking narcotic pain meds at night.  Has PT/OT coming to the house to work with him.  He did have low phosphorus while hospitalized and d/c summary recommends repeating labs.  Family states someone needs to come to the house as he is not able to get here for labs.  Will reach out to [facility] and see if they can send a HH RN to draw CBC w/ diff, CMP, mag, phos.  Pt expressed understanding and is in agreement w/ plan.    Neena Rhymes, MD 05/11/2023

## 2023-05-11 NOTE — Telephone Encounter (Signed)
[facility] has been notified

## 2023-05-11 NOTE — Telephone Encounter (Signed)
[facility] is asking for verbal orders for  Requesting :PT   Reason for Request:     Frequency:   2wk1, 1wk7  Is this ok ?

## 2023-05-12 ENCOUNTER — Telehealth: Payer: Self-pay | Admitting: Family Medicine

## 2023-05-12 DIAGNOSIS — D631 Anemia in chronic kidney disease: Secondary | ICD-10-CM | POA: Diagnosis not present

## 2023-05-12 DIAGNOSIS — E785 Hyperlipidemia, unspecified: Secondary | ICD-10-CM | POA: Diagnosis not present

## 2023-05-12 DIAGNOSIS — T387X5D Adverse effect of androgens and anabolic congeners, subsequent encounter: Secondary | ICD-10-CM | POA: Diagnosis not present

## 2023-05-12 DIAGNOSIS — E1122 Type 2 diabetes mellitus with diabetic chronic kidney disease: Secondary | ICD-10-CM | POA: Diagnosis not present

## 2023-05-12 DIAGNOSIS — I1310 Hypertensive heart and chronic kidney disease without heart failure, with stage 1 through stage 4 chronic kidney disease, or unspecified chronic kidney disease: Secondary | ICD-10-CM | POA: Diagnosis not present

## 2023-05-12 DIAGNOSIS — E538 Deficiency of other specified B group vitamins: Secondary | ICD-10-CM | POA: Diagnosis not present

## 2023-05-12 DIAGNOSIS — E1165 Type 2 diabetes mellitus with hyperglycemia: Secondary | ICD-10-CM | POA: Diagnosis not present

## 2023-05-12 DIAGNOSIS — N1832 Chronic kidney disease, stage 3b: Secondary | ICD-10-CM | POA: Diagnosis not present

## 2023-05-12 DIAGNOSIS — K5900 Constipation, unspecified: Secondary | ICD-10-CM | POA: Diagnosis not present

## 2023-05-12 NOTE — Telephone Encounter (Signed)
He needs to increase the frequency of the miralax to twice daily and use it in combination with either milk of magnesia or dulcolax.  When used together, that should get things moving.  And increase water intake

## 2023-05-12 NOTE — Telephone Encounter (Signed)
OTC Miralax can be used twice daily until he has a bowel movement.  This is 1 capful of powder dissolved in 8 oz of any liquid.  He could also try milk of magnesia liquid to help get things going.

## 2023-05-12 NOTE — Telephone Encounter (Signed)
Which liquid stool softer would you recommend for pt ?

## 2023-05-12 NOTE — Telephone Encounter (Signed)
Pt's wife called asking if there is any kind of laxative that he could take over the counter to help him with a bowel movement. He is on day 5 of not being able to have one. He has tried Synokote, Dulcolax gummies, and prune juice. None were helpful and wife states the prune juice burned his throat. Does he have any other OTC options that would not counteract his meds that his could get in liquid form?

## 2023-05-12 NOTE — Telephone Encounter (Signed)
Left the pt a detailed VM explaining DR Beverely Low message stated above

## 2023-05-12 NOTE — Telephone Encounter (Signed)
Informed pt that he can try mira lax or milk of magnesia . He states he has tried those and they are not helping

## 2023-05-13 ENCOUNTER — Telehealth: Payer: Self-pay | Admitting: Hematology

## 2023-05-13 ENCOUNTER — Other Ambulatory Visit (HOSPITAL_COMMUNITY): Payer: Self-pay

## 2023-05-13 NOTE — Telephone Encounter (Signed)
Called patient to schedule genetic counseling per 9/13 inbasket message. Patient's wife declined services and stated she would talk to Dr. [name] on the patient's next appointment regarding genetic counseling.

## 2023-05-17 ENCOUNTER — Telehealth: Payer: Self-pay | Admitting: Family Medicine

## 2023-05-17 DIAGNOSIS — E785 Hyperlipidemia, unspecified: Secondary | ICD-10-CM | POA: Diagnosis not present

## 2023-05-17 DIAGNOSIS — T387X5D Adverse effect of androgens and anabolic congeners, subsequent encounter: Secondary | ICD-10-CM | POA: Diagnosis not present

## 2023-05-17 DIAGNOSIS — D631 Anemia in chronic kidney disease: Secondary | ICD-10-CM | POA: Diagnosis not present

## 2023-05-17 DIAGNOSIS — K5900 Constipation, unspecified: Secondary | ICD-10-CM | POA: Diagnosis not present

## 2023-05-17 DIAGNOSIS — N1832 Chronic kidney disease, stage 3b: Secondary | ICD-10-CM | POA: Diagnosis not present

## 2023-05-17 DIAGNOSIS — E538 Deficiency of other specified B group vitamins: Secondary | ICD-10-CM | POA: Diagnosis not present

## 2023-05-17 DIAGNOSIS — I1310 Hypertensive heart and chronic kidney disease without heart failure, with stage 1 through stage 4 chronic kidney disease, or unspecified chronic kidney disease: Secondary | ICD-10-CM | POA: Diagnosis not present

## 2023-05-17 DIAGNOSIS — T380X5A Adverse effect of glucocorticoids and synthetic analogues, initial encounter: Secondary | ICD-10-CM

## 2023-05-17 DIAGNOSIS — E1165 Type 2 diabetes mellitus with hyperglycemia: Secondary | ICD-10-CM | POA: Diagnosis not present

## 2023-05-17 DIAGNOSIS — E1122 Type 2 diabetes mellitus with diabetic chronic kidney disease: Secondary | ICD-10-CM | POA: Diagnosis not present

## 2023-05-17 NOTE — Telephone Encounter (Signed)
Caller name: Akiva Mascolo  On DPR?: Yes  Call back number: [phone] (home)  Provider they see: Sheliah Hatch, MD  Reason for call:   Spouse called about blood work in last visit [name] mentioned [facility] coming out to draw blood. The spouse feels like she can bring him herself sooner. Advise

## 2023-05-17 NOTE — Telephone Encounter (Signed)
I have entered the labs for him.  Please call pt or family to schedule a lab visit at their convenience

## 2023-05-17 NOTE — Telephone Encounter (Signed)
Called and unable to leave vm- Phone is not working.

## 2023-05-17 NOTE — Telephone Encounter (Signed)
Called and left a voice message for patient and his spouse to give our office a call back to get set up for a lab appointment

## 2023-05-18 NOTE — Telephone Encounter (Signed)
Number no longer in service

## 2023-05-18 NOTE — Telephone Encounter (Signed)
Left vm on Talbert Forest (spouse) phone

## 2023-05-19 ENCOUNTER — Other Ambulatory Visit (INDEPENDENT_AMBULATORY_CARE_PROVIDER_SITE_OTHER): Payer: Medicare HMO

## 2023-05-19 DIAGNOSIS — R739 Hyperglycemia, unspecified: Secondary | ICD-10-CM | POA: Diagnosis not present

## 2023-05-19 DIAGNOSIS — T380X5A Adverse effect of glucocorticoids and synthetic analogues, initial encounter: Secondary | ICD-10-CM

## 2023-05-19 LAB — CBC WITH DIFFERENTIAL/PLATELET
Basophils Absolute: 0 10*3/uL (ref 0.0–0.1)
Basophils Relative: 0.5 % (ref 0.0–3.0)
Eosinophils Absolute: 0.1 10*3/uL (ref 0.0–0.7)
Eosinophils Relative: 1 % (ref 0.0–5.0)
HCT: 26.5 % — ABNORMAL LOW (ref 39.0–52.0)
Hemoglobin: 8.6 g/dL — ABNORMAL LOW (ref 13.0–17.0)
Lymphocytes Relative: 15.6 % (ref 12.0–46.0)
Lymphs Abs: 1 10*3/uL (ref 0.7–4.0)
MCHC: 32.3 g/dL (ref 30.0–36.0)
MCV: 96.2 fl (ref 78.0–100.0)
Monocytes Absolute: 0.2 10*3/uL (ref 0.1–1.0)
Monocytes Relative: 3.1 % (ref 3.0–12.0)
Neutro Abs: 5.2 10*3/uL (ref 1.4–7.7)
Neutrophils Relative %: 79.8 % — ABNORMAL HIGH (ref 43.0–77.0)
Platelets: 131 10*3/uL — ABNORMAL LOW (ref 150.0–400.0)
RBC: 2.75 Mil/uL — ABNORMAL LOW (ref 4.22–5.81)
RDW: 20.2 % — ABNORMAL HIGH (ref 11.5–15.5)
WBC: 6.4 10*3/uL (ref 4.0–10.5)

## 2023-05-19 LAB — MAGNESIUM: Magnesium: 1.4 mg/dL — ABNORMAL LOW (ref 1.5–2.5)

## 2023-05-19 LAB — HEPATIC FUNCTION PANEL
ALT: 9 U/L (ref 0–53)
AST: 17 U/L (ref 0–37)
Albumin: 3.2 g/dL — ABNORMAL LOW (ref 3.5–5.2)
Alkaline Phosphatase: 135 U/L — ABNORMAL HIGH (ref 39–117)
Bilirubin, Direct: 0.1 mg/dL (ref 0.0–0.3)
Total Bilirubin: 0.5 mg/dL (ref 0.2–1.2)
Total Protein: 6.2 g/dL (ref 6.0–8.3)

## 2023-05-19 LAB — BASIC METABOLIC PANEL
BUN: 35 mg/dL — ABNORMAL HIGH (ref 6–23)
CO2: 25 mEq/L (ref 19–32)
Calcium: 9.2 mg/dL (ref 8.4–10.5)
Chloride: 101 mEq/L (ref 96–112)
Creatinine, Ser: 1.65 mg/dL — ABNORMAL HIGH (ref 0.40–1.50)
GFR: 38.35 mL/min — ABNORMAL LOW (ref >60.00)
Glucose, Bld: 201 mg/dL — ABNORMAL HIGH (ref 70–99)
Potassium: 4.6 mEq/L (ref 3.5–5.1)
Sodium: 138 mEq/L (ref 135–145)

## 2023-05-19 LAB — PHOSPHORUS: Phosphorus: 2.7 mg/dL (ref 2.3–4.6)

## 2023-05-23 ENCOUNTER — Telehealth: Payer: Self-pay

## 2023-05-23 ENCOUNTER — Other Ambulatory Visit: Payer: Self-pay

## 2023-05-23 DIAGNOSIS — D649 Anemia, unspecified: Secondary | ICD-10-CM

## 2023-05-23 DIAGNOSIS — C61 Malignant neoplasm of prostate: Secondary | ICD-10-CM

## 2023-05-23 NOTE — Telephone Encounter (Signed)
Pt called stating he had labs drawn on 05/19/2023 w/PCP.  Pt stated the PCP was going to make Dr. Latanya Maudlin office aware of the pt's Hbg.  Pt is concerned about his hbg and want to know what's Dr. Latanya Maudlin opinion and what's causing his hbg to be low.  Stated to pt that a hbg can be low from a bleed some where within the body or it can be from his cancer which is in his bones.  Explained to pt that blood cells are produced in the bone marrow of the long bones and when you have cancer in your bones it can alter the production of your blood cells.  Pt verbalized understanding but would still like for Dr. [name] to contact him.  Stated this nurse will make Dr. [name] aware of the pt's call.

## 2023-05-23 NOTE — Telephone Encounter (Signed)
Spoke with pt via telephone to remind pt that he is scheduled to see Dr. [name] on 05/25/2023 and to have labs redrawn.  Stated that Dr. [name] will discuss pt's question regarding low hbg and monitor pt's hgb to see if pt's hgb has improved.  Pt verbalized understanding and stated he will be here on 05/25/2023 at 10am.  Verbal Order w/readback for Blood Bank hold given by Dr. [name].  Order placed.  Malachy Mood, MD  Arcelia Jew, RN; Verlee Rossetti, CMA; Salome Holmes, CMA Cc: Carlean Jews, NP; Pollyann Samples, NP Caller: Unspecified (Today,  9:28 AM) I reviewed his recent lab, Hg 8.6, he may need blood transfusion soon, he is scheduled to see me in 2 days, I will talk to him then. Please send blood sample to blood bank on 9/25 and remind pt about his appointments in 2 days, thx  Malachy Mood

## 2023-05-23 NOTE — Progress Notes (Unsigned)
Palliative Medicine [facility]  [phone] Fax:([phone]) [phone]   Name: Wayne Perkins Date: 05/23/2023 MRN: [id number]  DOB: 07-27-1941  Patient Care Team: Sheliah Hatch, MD as PCP - General (Family Medicine) Jodelle Red, MD as PCP - Cardiology (Cardiology) Cindie Laroche, MD as Referring Physician (Urology) Felicita Gage, RN Nurse Navigator as Registered Nurse (Medical Oncology) Malachy Mood, MD as Consulting Physician (Hematology and Oncology) Pickenpack-Cousar, Arty Baumgartner, NP as Nurse Practitioner (Hospice and Palliative Medicine)    INTERVAL HISTORY: [name] is a 82 y.o. adult with  oncologic medical history including malignant neoplasm of prostate (07/2019) with metastatic disease to bone, as well as HLD, hypertension, CAD, CKD stage 3, type 2 diabetes, anemia, and a TIA in 2020.  Palliative ask to see for symptom management and goals of care.   SOCIAL HISTORY:     reports that he has never smoked. He has never used smokeless tobacco. He reports that he does not currently use alcohol. He reports that he does not use drugs.  ADVANCE DIRECTIVES:  None on file  CODE STATUS: Full code  PAST MEDICAL HISTORY: Past Medical History:  Diagnosis Date   Arthritis    Blood in stool    Childhood asthma    Diabetes mellitus without complication (HCC)    type II   Heart disease    Hypertension, essential    Prostate cancer (HCC)    Stroke (HCC)     ALLERGIES:  is allergic to tramadol, dexamethasone, glipizide, and statins.  MEDICATIONS:  Current Outpatient Medications  Medication Sig Dispense Refill   Alcohol Swabs (B-D SINGLE USE SWABS REGULAR) PADS      Blood Glucose Calibration (ACCU-CHEK AVIVA) SOLN      blood glucose meter kit and supplies Use once a day in the AM for fasting sugars. 1 each 0   calcium-vitamin D (OSCAL WITH D) 500-200 MG-UNIT tablet Take 1 tablet by mouth 2 (two) times daily. 60 tablet 3    Cholecalciferol (VITAMIN D3) 50 MCG (2000 UT) TABS Take 2,000 Units by mouth daily. (Patient not taking: Reported on 05/11/2023)     clopidogrel (PLAVIX) 75 MG tablet TAKE 1 TABLET EVERY DAY (Patient taking differently: Take 75 mg by mouth in the morning.) 90 tablet 3   dapagliflozin propanediol (FARXIGA) 10 MG TABS tablet Take 1 tablet (10 mg total) by mouth daily before breakfast. 7 tablet 0   Dulaglutide (TRULICITY) 0.75 MG/0.5ML SOPN Inject 0.75 mg into the skin every Wednesday. 4 pen 0   DULoxetine (CYMBALTA) 30 MG capsule Take 1 capsule (30 mg total) by mouth daily. 30 capsule 0   Ez Smart Blood Glucose Lancets MISC by Does not apply route. 100 each 3   glucose blood test strip Use as instructed 100 each 3   hydrochlorothiazide (HYDRODIURIL) 25 MG tablet Take 1 tablet (25 mg total) by mouth daily. 90 tablet 3   hydrOXYzine (ATARAX) 10 MG tablet TAKE 1 TABLET BY MOUTH 3 TIMES DAILY AS NEEDED FOR ITCHING. (Patient taking differently: Take 10 mg by mouth 3 (three) times daily as needed for itching.) 270 tablet 1   isosorbide mononitrate (IMDUR) 30 MG 24 hr tablet Take 1 tablet (30 mg total) by mouth daily. (Patient taking differently: Take 30 mg by mouth daily as needed (for chest pain).) 90 tablet 3   metFORMIN (GLUCOPHAGE) 500 MG tablet Take 1 tablet (500 mg total) by mouth 2 (two) times daily with a meal. 60 tablet  0   Multiple Vitamin (MULTI-DAY VITAMINS PO) Take 1 tablet by mouth daily with breakfast.     NIFEdipine (PROCARDIA XL/NIFEDICAL-XL) 90 MG 24 hr tablet Take 1 tablet (90 mg total) by mouth daily. 90 tablet 3   nitroGLYCERIN (NITROSTAT) 0.4 MG SL tablet Place 1 tablet (0.4 mg total) under the tongue every 5 (five) minutes as needed for chest pain. 25 tablet 3   oxyCODONE (OXY IR/ROXICODONE) 5 MG immediate release tablet Take 1 tablet (5 mg total) by mouth every 6 (six) hours as needed for severe pain. (Patient taking differently: Take 5 mg by mouth at bedtime.) 45 tablet 0    polyethylene glycol (MIRALAX / GLYCOLAX) 17 g packet Take 8.5 g by mouth in the morning and at bedtime.     potassium chloride SA (KLOR-CON M) 20 MEQ tablet Take 1 tablet (20 mEq total) by mouth 2 (two) times daily. 180 tablet 3   rosuvastatin (CRESTOR) 5 MG tablet TAKE 1 TABLET EVERY DAY 90 tablet 3   sucralfate (CARAFATE) 1 GM/10ML suspension Take 10 mLs (1 g total) by mouth 4 (four) times daily -  with meals and at bedtime. 420 mL 0   TYLENOL 325 MG CAPS Take 325-650 mg by mouth every 6 (six) hours as needed (for pain).     vitamin B-12 (CYANOCOBALAMIN) 1000 MCG tablet Take 1,000 mcg by mouth daily.     No current facility-administered medications for this visit.    VITAL SIGNS: There were no vitals taken for this visit. There were no vitals filed for this visit.  Estimated body mass index is 20.81 kg/m as calculated from the following:   Height as of 05/11/23: 5\' 10"  (1.778 m).   Weight as of 05/11/23: 145 lb (65.8 kg).     Latest Ref Rng & Units 05/19/2023   10:57 AM 05/05/2023    3:03 AM 05/04/2023    6:09 PM  CMP  Glucose 70 - 99 mg/dL 811  914  782   BUN 6 - 23 mg/dL 35  45  54   Creatinine 0.40 - 1.50 mg/dL 9.56  2.13  0.86   Sodium 135 - 145 mEq/L 138  136  129   Potassium 3.5 - 5.1 mEq/L 4.6  3.6  3.3   Chloride 96 - 112 mEq/L 101  97  87   CO2 19 - 32 mEq/L 25  26  27    Calcium 8.4 - 10.5 mg/dL 9.2  9.1  9.0   Total Protein 6.0 - 8.3 g/dL 6.2  5.8    Total Bilirubin 0.2 - 1.2 mg/dL 0.5  0.5    Alkaline Phos 39 - 117 U/L 135  138    AST 0 - 37 U/L 17  29    ALT 0 - 53 U/L 9  20      PERFORMANCE STATUS (ECOG) : 1 - Symptomatic but completely ambulatory   Physical Exam General: NAD Cardiovascular: regular rate and rhythm Pulmonary: clear ant fields Abdomen: soft, nontender, + bowel sounds Extremities: no edema, no joint deformities Skin: no rashes Neurological: AAO x4  IMPRESSION: Mr. Berenguer presents to clinic for follow-up.  No acute distress noted.  Patient is in  wheelchair.  Family is present.  Denies nausea, vomiting, constipation, or diarrhea.  Appetite is fair.  Neoplasm related pain Mr. Repinski reports his pain is much improved and minimal at this time. He is appreciative of this.  States he is only requiring pain medication at bedtime as this is  when his pain is most noticeable in his lower extremities.   At this time given significant improvement he understands we will not make any adjustments to his regimen. We will continue to closely monitor and support in addition to his Oncology/Radiation team. He knows to contact the office with any changes.    Constipation Constipation has improved with twice daily Miralax and Senna. Patient reports last bowel movement this morning without difficulty. Encouraged to continue with regimen.   Goals of Care  04/21/23- We discussed his current illness and what it means in the larger context of his on-going co-morbidities. Natural disease trajectory and expectations were discussed.   Mr. Whitehall and his wife are realist in their understanding of his current illness. They are appreciative of the care and support that he is receiving. They are remaining hopeful for ongoing improvement and clear in expressed wishes to continue to treat the treatable allowing him every opportunity to continue thriving while managing his symptoms and focusing on improving his quality of life.  We discussed Her current illness and what it means in the larger context of Her on-going co-morbidities. Natural disease trajectory and expectations were discussed.  I discussed the importance of continued conversation with family and their medical providers regarding overall plan of care and treatment options, ensuring decisions are within the context of the patients values and GOCs.  PLAN:  Continue oxycodone at bedtime as needed. Not requiring daily.  Miralax twice daily Senna 2 tabs at bedtime. If no bowel movement for more than 48 hours increase  to twice daily.  Ongoing goals of care and symptom management support I will plan to see patient back in 2-3 weeks in collaboration to other oncology appointments.    Patient expressed understanding and was in agreement with this plan. He also understands that He can call the clinic at any time with any questions, concerns, or complaints.   Any controlled substances utilized were prescribed in the context of palliative care. PDMP has been reviewed.    Visit consisted of counseling and education dealing with the complex and emotionally intense issues of symptom management and palliative care in the setting of serious and potentially life-threatening illness.Greater than 50%  of this time was spent counseling and coordinating care related to the above assessment and plan.  Willette Alma, AGPCNP-BC  Palliative Medicine Team/[facility]  *Please note that this is a verbal dictation therefore any spelling or grammatical errors are due to the "[employer]agon [employer] One" system interpretation.

## 2023-05-24 ENCOUNTER — Other Ambulatory Visit: Payer: Self-pay | Admitting: Nurse Practitioner

## 2023-05-24 ENCOUNTER — Other Ambulatory Visit: Payer: Self-pay

## 2023-05-24 ENCOUNTER — Telehealth: Payer: Self-pay

## 2023-05-24 DIAGNOSIS — C61 Malignant neoplasm of prostate: Secondary | ICD-10-CM

## 2023-05-24 DIAGNOSIS — G893 Neoplasm related pain (acute) (chronic): Secondary | ICD-10-CM

## 2023-05-24 DIAGNOSIS — Z515 Encounter for palliative care: Secondary | ICD-10-CM

## 2023-05-24 MED ORDER — OXYCODONE HCL 5 MG PO TABS: 5.0000 mg | ORAL_TABLET | Freq: Four times a day (QID) | ORAL | 0 refills | Status: DC | PRN

## 2023-05-24 NOTE — Telephone Encounter (Signed)
Pt called into the office wanted a refill on his medication Oxycodone. I message the MD on this matter. I let the pt know. He verbalized understanding.  Rondel Jumbo, CMA

## 2023-05-25 ENCOUNTER — Inpatient Hospital Stay (HOSPITAL_BASED_OUTPATIENT_CLINIC_OR_DEPARTMENT_OTHER): Payer: Medicare HMO | Admitting: Nurse Practitioner

## 2023-05-25 ENCOUNTER — Encounter: Payer: Self-pay | Admitting: Hematology

## 2023-05-25 ENCOUNTER — Ambulatory Visit: Payer: Medicare HMO

## 2023-05-25 ENCOUNTER — Inpatient Hospital Stay: Payer: Medicare HMO

## 2023-05-25 ENCOUNTER — Encounter: Payer: Self-pay | Admitting: Nurse Practitioner

## 2023-05-25 ENCOUNTER — Inpatient Hospital Stay (HOSPITAL_BASED_OUTPATIENT_CLINIC_OR_DEPARTMENT_OTHER): Payer: Medicare HMO | Admitting: Hematology

## 2023-05-25 ENCOUNTER — Other Ambulatory Visit: Payer: Self-pay

## 2023-05-25 VITALS — BP 110/50 | HR 62 | Temp 97.9°F | Resp 18 | Ht 70.0 in | Wt 142.2 lb

## 2023-05-25 DIAGNOSIS — R530 Neoplastic (malignant) related fatigue: Secondary | ICD-10-CM

## 2023-05-25 DIAGNOSIS — C61 Malignant neoplasm of prostate: Secondary | ICD-10-CM

## 2023-05-25 DIAGNOSIS — C7951 Secondary malignant neoplasm of bone: Secondary | ICD-10-CM

## 2023-05-25 DIAGNOSIS — Z515 Encounter for palliative care: Secondary | ICD-10-CM | POA: Diagnosis not present

## 2023-05-25 DIAGNOSIS — D630 Anemia in neoplastic disease: Secondary | ICD-10-CM | POA: Diagnosis not present

## 2023-05-25 DIAGNOSIS — Z79899 Other long term (current) drug therapy: Secondary | ICD-10-CM | POA: Diagnosis not present

## 2023-05-25 DIAGNOSIS — Z923 Personal history of irradiation: Secondary | ICD-10-CM | POA: Diagnosis not present

## 2023-05-25 DIAGNOSIS — D649 Anemia, unspecified: Secondary | ICD-10-CM

## 2023-05-25 DIAGNOSIS — G893 Neoplasm related pain (acute) (chronic): Secondary | ICD-10-CM | POA: Diagnosis not present

## 2023-05-25 LAB — CBC WITH DIFFERENTIAL (CANCER CENTER ONLY)
Abs Immature Granulocytes: 0.32 10*3/uL — ABNORMAL HIGH (ref 0.00–0.07)
Basophils Absolute: 0 10*3/uL (ref 0.0–0.1)
Basophils Relative: 1 %
Eosinophils Absolute: 0.1 10*3/uL (ref 0.0–0.5)
Eosinophils Relative: 2 %
HCT: 23.8 % — ABNORMAL LOW (ref 39.0–52.0)
Hemoglobin: 7.8 g/dL — ABNORMAL LOW (ref 13.0–17.0)
Immature Granulocytes: 7 %
Lymphocytes Relative: 18 %
Lymphs Abs: 0.9 10*3/uL (ref 0.7–4.0)
MCH: 32.2 pg (ref 26.0–34.0)
MCHC: 32.8 g/dL (ref 30.0–36.0)
MCV: 98.3 fL (ref 80.0–100.0)
Monocytes Absolute: 0.4 10*3/uL (ref 0.1–1.0)
Monocytes Relative: 8 %
Neutro Abs: 3.1 10*3/uL (ref 1.7–7.7)
Neutrophils Relative %: 64 %
Platelet Count: 121 10*3/uL — ABNORMAL LOW (ref 150–400)
RBC: 2.42 MIL/uL — ABNORMAL LOW (ref 4.22–5.81)
RDW: 19.2 % — ABNORMAL HIGH (ref 11.5–15.5)
Smear Review: NORMAL
WBC Count: 4.8 10*3/uL (ref 4.0–10.5)
nRBC: 1.7 % — ABNORMAL HIGH (ref 0.0–0.2)

## 2023-05-25 LAB — SAMPLE TO BLOOD BANK

## 2023-05-25 LAB — PREPARE RBC (CROSSMATCH)

## 2023-05-25 MED ORDER — LEUPROLIDE ACETATE (3 MONTH) 22.5 MG ~~LOC~~ KIT
22.5000 mg | PACK | Freq: Once | SUBCUTANEOUS | Status: AC
  Administered 2023-05-25: 22.5 mg via SUBCUTANEOUS
  Filled 2023-05-25: qty 22.5

## 2023-05-25 NOTE — Progress Notes (Signed)
[facility]   [phone] Fax:([phone]) [phone]   Clinic Follow up Note   Patient Care Team: Sheliah Hatch, MD as PCP - General (Family Medicine) Jodelle Red, MD as PCP - Cardiology (Cardiology) Cindie Laroche, MD as Referring Physician (Urology) Felicita Gage, RN Nurse Navigator as Registered Nurse (Medical Oncology) Malachy Mood, MD as Consulting Physician (Hematology and Oncology) Pickenpack-Cousar, Arty Baumgartner, NP as Nurse Practitioner [facility] and Palliative Medicine)  Date of Service:  05/25/2023  CHIEF COMPLAINT: f/u of metastatic prostate cancer  CURRENT THERAPY:  Eligard every 3 months  Oncology History   Malignant neoplasm of prostate metastatic to bone [facility]) -Stage IV with note and bone metastasis, diagnosed in 2021, castration resistant -Initially presented with localized disease and Gleason score 6 in 2015.  He received radiation treatment in 2015. -He started androgen deprivation therapy for biochemical recurrent disease subsequently, with PSA decreased down to 0.4 in 2018.  He received a intermittent ADT due to side effects. -He developed bone and lymph node metastasis in 2021, started Xtandi 160 mg daily in June 2021.  Therapy stopped after 3 months due to poor tolerance. -He started Zytiga 500 mg daily with prednisone 5 mg daily in May 2022.  Therapy discontinued in June 2022 due to poor tolerance.  He is only on Eligard every 3 months now. -He started [place] on September 29, 2022, he tolerated treatments very well but anemia got worse.  He completed on 05/04/2023 -Unfortunately his PSA has gone up from 440 in 09/2022 to 1870 on 05/04/2023 and his pain has gotten worse  -We reviewed other treatment options for metastatic prostate cancer, patient is not interested in chemotherapy, but is open to other biological agent.  Will check his genetics and Guardant 360, to see if he is a candidate for PARP inhibitor down the road.    Assessment  and Plan    Prostate Cancer with Bone Metastasis Generalized weakness and unsteady gait. No pain reported. PSA above 1000 indicating poor response to Zofigo treatment. Discussed the risks/benefits of Pluvicto treatment. -Continue Eligard injections. -Consult with Dr. Amil Amen regarding Pluvicto treatment. -Perform Guardian 360 test to check for circulating tumor DNA and potential mutations. -Perform genetic testing to check for gene mutations related to prostate cancer. -Continue monitoring blood counts and provide blood transfusion as needed (approximately once a month).  Anemia Hemoglobin 7.8, trending down from 8.6. Patient reports feeling like he could use a "boost". -Schedule blood transfusion.  Generalized Weakness Patient reports feeling stronger but still has difficulty with mobility and requires support when walking. Neck feels heavy after a while. -Encourage use of a walker or cane to prevent falls and fractures. -Encourage increased physical activity as tolerated.  Poor Appetite Patient reports food not tasting the same and has lost weight (142 lbs from 145 lbs). Currently taking half a bottle of Ensure Boost with medication. -Encourage increased intake of Ensure Boost to at least one bottle a day.  Constipation Likely related to oxycodone use. Patient reports improvement since hospital discharge. -Encourage increased intake of Miralax and water.   General Health Maintenance -Encourage intake of calcium and vitamin D supplements. -Continue monitoring blood sugar levels. -Schedule follow-up in three months with monthly labs and blood transfusions as needed.     Plan - will proceed Eligard injection today and every 3 months  -1u blood transfusion in next few days -lab and 3-hour blood transfusion monthly -I will reach out to Dr. Amil Amen to see if he is a  candidate for Pluvicto -Follow-up in 3 months    SUMMARY OF ONCOLOGIC HISTORY: Oncology History Overview Note    Cancer Staging  Malignant neoplasm of prostate metastatic to bone [facility]) Staging form: Prostate, AJCC 8th Edition - Clinical: Stage IVB (cTX, cNX, pM1b, Grade Group: 3) - Signed by Benjiman Core, MD on 02/17/2021 Gleason score: 7 Histologic grading system: 5 grade system     Malignant neoplasm of prostate metastatic to bone (HCC)  07/31/2019 Initial Diagnosis   Malignant neoplasm of prostate metastatic to bone (HCC)   02/17/2021 Cancer Staging   Staging form: Prostate, AJCC 8th Edition - Clinical: Stage IVB (cTX, cNX, pM1b, Grade Group: 3) - Signed by Benjiman Core, MD on 02/17/2021 Gleason score: 7 Histologic grading system: 5 grade system   Prostate cancer (HCC)  02/19/2020 Initial Diagnosis   Prostate cancer [facility])      Discussed the use of AI scribe software for clinical note transcription with the patient, who gave verbal consent to proceed.  History of Present Illness   The patient, an 82 year old male with a history of prostate cancer, presents for a follow-up visit. He reports feeling stronger since his recent hospitalization, but notes difficulty with mobility and balance. He has been using a walker at home, but has recently stopped using it, despite his family's concerns about his stability. He denies any pain but mentions a sensation of heaviness in his neck.  The patient's appetite has been poor, and he reports changes in the taste of his food. He has been supplementing his diet with half a bottle of Ensure Boost daily to aid with medication intake. He acknowledges the need to increase his intake of the supplement for additional energy.  The patient has been experiencing constipation, which may be related to his nightly use of oxycodone for sleep. He has been using Miralax intermittently to manage this issue. He also reports a recent improvement in his bowel movements.  The patient's blood counts have been low, necessitating regular blood transfusions. He reports feeling  in need of a "boost," suggesting that his energy levels may be low. He is due for another blood transfusion.  The patient's medication regimen includes metformin, oxycodone, and Alacort injections. He recently completed a course of Farxiga and Sucralfate. He is unsure if he is still taking Cymbalta (duloxetine) and reports occasional use of Tylenol. He has a glucose monitor to manage his blood sugar levels, which have been improving.         All other systems were reviewed with the patient and are negative.  MEDICAL HISTORY:  Past Medical History:  Diagnosis Date   Arthritis    Blood in stool    Childhood asthma    Diabetes mellitus without complication (HCC)    type II   Heart disease    Hypertension, essential    Prostate cancer (HCC)    Stroke [facility])     SURGICAL HISTORY: Past Surgical History:  Procedure Laterality Date   CORONARY ANGIOPLASTY WITH STENT PLACEMENT     GANGLION CYST EXCISION Right    PROSTATE BIOPSY      I have reviewed the social history and family history with the patient and they are unchanged from previous note.  ALLERGIES:  is allergic to tramadol, dexamethasone, glipizide, and statins.  MEDICATIONS:  Current Outpatient Medications  Medication Sig Dispense Refill   Alcohol Swabs (B-D SINGLE USE SWABS REGULAR) PADS      Blood Glucose Calibration (ACCU-CHEK AVIVA) SOLN  blood glucose meter kit and supplies Use once a day in the AM for fasting sugars. 1 each 0   calcium-vitamin D (OSCAL WITH D) 500-200 MG-UNIT tablet Take 1 tablet by mouth 2 (two) times daily. 60 tablet 3   Cholecalciferol (VITAMIN D3) 50 MCG (2000 UT) TABS Take 2,000 Units by mouth daily. (Patient not taking: Reported on 05/11/2023)     clopidogrel (PLAVIX) 75 MG tablet TAKE 1 TABLET EVERY DAY (Patient taking differently: Take 75 mg by mouth in the morning.) 90 tablet 3   Dulaglutide (TRULICITY) 0.75 MG/0.5ML SOPN Inject 0.75 mg into the skin every Wednesday. 4 pen 0   DULoxetine  (CYMBALTA) 30 MG capsule Take 1 capsule (30 mg total) by mouth daily. 30 capsule 0   Ez Smart Blood Glucose Lancets MISC by Does not apply route. 100 each 3   glucose blood test strip Use as instructed 100 each 3   hydrochlorothiazide (HYDRODIURIL) 25 MG tablet Take 1 tablet (25 mg total) by mouth daily. 90 tablet 3   hydrOXYzine (ATARAX) 10 MG tablet TAKE 1 TABLET BY MOUTH 3 TIMES DAILY AS NEEDED FOR ITCHING. (Patient taking differently: Take 10 mg by mouth 3 (three) times daily as needed for itching.) 270 tablet 1   isosorbide mononitrate (IMDUR) 30 MG 24 hr tablet Take 1 tablet (30 mg total) by mouth daily. (Patient taking differently: Take 30 mg by mouth daily as needed (for chest pain).) 90 tablet 3   metFORMIN (GLUCOPHAGE) 500 MG tablet Take 1 tablet (500 mg total) by mouth 2 (two) times daily with a meal. 60 tablet 0   Multiple Vitamin (MULTI-DAY VITAMINS PO) Take 1 tablet by mouth daily with breakfast.     NIFEdipine (PROCARDIA XL/NIFEDICAL-XL) 90 MG 24 hr tablet Take 1 tablet (90 mg total) by mouth daily. 90 tablet 3   nitroGLYCERIN (NITROSTAT) 0.4 MG SL tablet Place 1 tablet (0.4 mg total) under the tongue every 5 (five) minutes as needed for chest pain. 25 tablet 3   oxyCODONE (OXY IR/ROXICODONE) 5 MG immediate release tablet Take 1 tablet (5 mg total) by mouth every 6 (six) hours as needed for severe pain. 45 tablet 0   polyethylene glycol (MIRALAX / GLYCOLAX) 17 g packet Take 8.5 g by mouth in the morning and at bedtime.     potassium chloride SA (KLOR-CON M) 20 MEQ tablet Take 1 tablet (20 mEq total) by mouth 2 (two) times daily. 180 tablet 3   rosuvastatin (CRESTOR) 5 MG tablet TAKE 1 TABLET EVERY DAY 90 tablet 3   TYLENOL 325 MG CAPS Take 325-650 mg by mouth every 6 (six) hours as needed (for pain).     vitamin B-12 (CYANOCOBALAMIN) 1000 MCG tablet Take 1,000 mcg by mouth daily.     No current facility-administered medications for this visit.    PHYSICAL EXAMINATION: ECOG  PERFORMANCE STATUS: 2 - Symptomatic, <50% confined to bed  Vitals:   05/25/23 1052  BP: (!) 110/50  Pulse: 62  Resp: 18  Temp: 97.9 F (36.6 C)  SpO2: 96%   Wt Readings from Last 3 Encounters:  05/25/23 142 lb 3.2 oz (64.5 kg)  05/11/23 145 lb (65.8 kg)  05/04/23 144 lb 13.5 oz (65.7 kg)     GENERAL:alert, no distress and comfortable SKIN: skin color, texture, turgor are normal, no rashes or significant lesions EYES: normal, Conjunctiva are pink and non-injected, sclera clear NECK: supple, thyroid normal size, non-tender, without nodularity LYMPH:  no palpable lymphadenopathy in the cervical, axillary  LUNGS: clear to auscultation and percussion with normal breathing effort HEART: regular rate & rhythm and no murmurs and no lower extremity edema ABDOMEN:abdomen soft, non-tender and normal bowel sounds Musculoskeletal:no cyanosis of digits and no clubbing  NEURO: alert & oriented x 3 with fluent speech, no focal motor/sensory deficits  Physical Exam   MEASUREMENTS: WT- 142      LABORATORY DATA:  I have reviewed the data as listed    Latest Ref Rng & Units 05/25/2023   10:21 AM 05/19/2023   10:57 AM 05/05/2023    3:03 AM  CBC  WBC 4.0 - 10.5 K/uL 4.8  6.4  5.9   Hemoglobin 13.0 - 17.0 g/dL 7.8  8.6 Repeated and verified X2.  9.2   Hematocrit 39.0 - 52.0 % 23.8  26.5 Repeated and verified X2.  27.5   Platelets 150 - 400 K/uL 121  131.0  155         Latest Ref Rng & Units 05/19/2023   10:57 AM 05/05/2023    3:03 AM 05/04/2023    6:09 PM  CMP  Glucose 70 - 99 mg/dL 147  829  562   BUN 6 - 23 mg/dL 35  45  54   Creatinine 0.40 - 1.50 mg/dL 1.30  8.65  7.84   Sodium 135 - 145 mEq/L 138  136  129   Potassium 3.5 - 5.1 mEq/L 4.6  3.6  3.3   Chloride 96 - 112 mEq/L 101  97  87   CO2 19 - 32 mEq/L 25  26  27    Calcium 8.4 - 10.5 mg/dL 9.2  9.1  9.0   Total Protein 6.0 - 8.3 g/dL 6.2  5.8    Total Bilirubin 0.2 - 1.2 mg/dL 0.5  0.5    Alkaline Phos 39 - 117 U/L 135  138     AST 0 - 37 U/L 17  29    ALT 0 - 53 U/L 9  20        RADIOGRAPHIC STUDIES: I have personally reviewed the radiological images as listed and agreed with the findings in the report. No results found.    No orders of the defined types were placed in this encounter.  All questions were answered. The patient knows to call the clinic with any problems, questions or concerns. No barriers to learning was detected. The total time spent in the appointment was 30 minutes.     Malachy Mood, MD 05/25/2023

## 2023-05-25 NOTE — Patient Instructions (Signed)

## 2023-05-25 NOTE — Progress Notes (Signed)
Verbal order give to administer 1 unit of PRBC from Dr. [name]. Spoke with Minerva Areola in blood bank T/S order received. Pt will have 1 unit of PRBC transfused on 05/26/2023.

## 2023-05-25 NOTE — Assessment & Plan Note (Signed)
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  He received radiation treatment in 2015. -He started androgen deprivation therapy for biochemical recurrent disease subsequently, with PSA decreased down to 0.4 in 2018.  He received a intermittent ADT due to side effects. -He developed bone and lymph node metastasis in 2021, started Xtandi 160 mg daily in June 2021.  Therapy stopped after 3 months due to poor tolerance. -He started Zytiga 500 mg daily with prednisone 5 mg daily in May 2022.  Therapy discontinued in June 2022 due to poor tolerance.  He is only on Eligard every 3 months now. -He started [place] on September 29, 2022, he tolerated treatments very well but anemia got worse.  He completed on 05/04/2023 -Unfortunately his PSA has gone up from 440 in 09/2022 to 1870 on 05/04/2023 and his pain has gotten worse  -We reviewed other treatment options for metastatic prostate cancer, patient is not interested in chemotherapy, but is open to other biological agent.  Will check his genetics and Guardant 360, to see if he is a candidate for PARP inhibitor down the road.

## 2023-05-26 ENCOUNTER — Other Ambulatory Visit: Payer: Self-pay | Admitting: Hematology

## 2023-05-26 ENCOUNTER — Inpatient Hospital Stay: Payer: Medicare HMO

## 2023-05-26 ENCOUNTER — Telehealth: Payer: Self-pay

## 2023-05-26 DIAGNOSIS — D649 Anemia, unspecified: Secondary | ICD-10-CM

## 2023-05-26 DIAGNOSIS — C61 Malignant neoplasm of prostate: Secondary | ICD-10-CM | POA: Diagnosis not present

## 2023-05-26 DIAGNOSIS — Z79899 Other long term (current) drug therapy: Secondary | ICD-10-CM | POA: Diagnosis not present

## 2023-05-26 DIAGNOSIS — D630 Anemia in neoplastic disease: Secondary | ICD-10-CM | POA: Diagnosis not present

## 2023-05-26 DIAGNOSIS — Z923 Personal history of irradiation: Secondary | ICD-10-CM | POA: Diagnosis not present

## 2023-05-26 DIAGNOSIS — C7951 Secondary malignant neoplasm of bone: Secondary | ICD-10-CM | POA: Diagnosis not present

## 2023-05-26 LAB — TYPE AND SCREEN
ABO/RH(D): A NEG
Antibody Screen: NEGATIVE
Unit division: 0

## 2023-05-26 LAB — BPAM RBC
Blood Product Expiration Date: 202410072359
Unit Type and Rh: 202410072359
Unit Type and Rh: 600

## 2023-05-26 MED ORDER — SODIUM CHLORIDE 0.9% IV SOLUTION
250.0000 mL | Freq: Once | INTRAVENOUS | Status: AC
  Administered 2023-05-26: 250 mL via INTRAVENOUS

## 2023-05-26 NOTE — Patient Instructions (Signed)
Blood Transfusion, Adult A blood transfusion is a procedure in which you receive blood or a type of blood cell (blood component) through an IV. You may need a blood transfusion when you have a low blood count, which is a low number of any blood cell. This may result from a bleeding disorder, illness, injury, or surgery. The blood may come from a donor, or you may be able to have your own blood collected and stored (autologous blood donation) before a planned surgery. The blood given in a transfusion may be made up of different blood components. You may receive: Red blood cells. These carry oxygen to the cells in the body. Platelets. These help your blood to clot. Plasma. This is the liquid part of your blood. It carries proteins and other substances throughout the body. White blood cells. These help you fight infections. If you have hemophilia or another clotting disorder, you may also receive other types of blood products. Depending on the type of blood product, this procedure may take 1-4 hours to complete. Tell a health care provider about: Any bleeding problems you have. Any previous reactions you have had during a blood transfusion. Any allergies you have. All medicines you are taking, including vitamins, herbs, eye drops, creams, and over-the-counter medicines. Any surgeries you have had. Any medical conditions you have. Whether you are pregnant or may be pregnant. What are the risks? Talk with your health care provider about risks. The most common problems include: A mild allergic reaction, such as red, swollen areas of skin (hives) and itching. Fever or chills. This may be the body's response to new blood cells received. This may occur during or up to 4 hours after the transfusion. More serious problems may include: A serious allergic reaction that causes difficulty breathing or swelling around the face and lips. Transfusion-associated circulatory overload (TACO), or too much fluid in  the lungs. This may cause breathing problems. Transfusion-related acute lung injury (TRALI), which causes breathing difficulty and low oxygen in the blood. This can occur within hours of the transfusion or several days later. Iron overload. This can happen after receiving many blood transfusions over a period of time. Infection or virus being transmitted. This is rare because donated blood is carefully tested before it is given. Hemolytic transfusion reaction. This is rare. It happens when the body's defense system (immune system)tries to attack the new blood cells. Symptoms may include fever, chills, nausea, low blood pressure, and low back or chest pain. Transfusion-associated graft-versus-host disease (TAGVHD). This is rare. It happens when donated cells attack the body's healthy tissues. What happens before the procedure? You will have a blood test to check your blood type. This test is done to know what kind of blood your body will accept and to match it to the donor blood. If you are going to have a planned surgery, you may be able to do an autologous blood donation. This may be done in case you need to have a transfusion. You will have your temperature, blood pressure, and pulse checked before the transfusion. If you have had an allergic reaction to a transfusion in the past, you may be given medicine to help prevent a reaction. This medicine may be given to you by mouth (orally) or through an IV. What happens during the procedure?  An IV will be inserted into one of your veins. The bag of blood will be attached to your IV. The blood will then enter through your vein. Your temperature, blood pressure,  and pulse will be monitored during the transfusion. This monitoring is done to detect early signs of a transfusion reaction. Tell your nurse right away if you have any of these symptoms during the transfusion: Shortness of breath or trouble breathing. Chest or back pain. Fever or  chills. Itching or hives. If you have any signs or symptoms of a reaction, your transfusion will be stopped and you may be given medicine. When the transfusion is complete, your IV will be removed. Pressure may be applied to the IV site for a few minutes. A bandage (dressing)will be applied. The procedure may vary among health care providers and hospitals. What happens after the procedure? Your temperature, blood pressure, pulse, breathing rate, and blood oxygen level will be monitored until you leave the hospital or clinic. Your blood may be tested to see how you have responded to the transfusion. You may be warmed with fluids or blankets to maintain a normal body temperature. If you receive your blood transfusion in an outpatient setting, you will be told whom to contact to report any reactions. Where to find more information Visit the [employer]: redcross.org Summary A blood transfusion is a procedure in which you receive blood or a type of blood cell (blood component) through an IV. The blood given in a transfusion may be made up of different blood components. You may receive red blood cells, platelets, plasma, or white blood cells depending on the condition treated. Your temperature, blood pressure, and pulse will be monitored before, during, and after the transfusion. After the transfusion, your blood may be tested to see how your body has responded. This information is not intended to replace advice given to you by your health care provider. Make sure you discuss any questions you have with your health care provider. Document Revised: 11/13/2021 Document Reviewed: 11/13/2021 Elsevier Patient Education  2024 [employer].

## 2023-05-26 NOTE — Telephone Encounter (Signed)
LVM for pt's wife Talbert Forest that Dr. [name] reached out to Dr. Peterson Lombard regarding Pulvicto treatments.  Stated that unfortunately the pt is not a candidate on Pulvicto.  Stated that if pt has questions regarding this nurse's message to please contact Dr. Latanya Maudlin office.    Also, tried contacting pt on pt's phone but unsuccessful in speaking with pt.  Not able to leave voicemail for pt.

## 2023-05-30 ENCOUNTER — Telehealth: Payer: Self-pay | Admitting: Family Medicine

## 2023-05-30 DIAGNOSIS — D631 Anemia in chronic kidney disease: Secondary | ICD-10-CM | POA: Diagnosis not present

## 2023-05-30 DIAGNOSIS — E1165 Type 2 diabetes mellitus with hyperglycemia: Secondary | ICD-10-CM | POA: Diagnosis not present

## 2023-05-30 DIAGNOSIS — E538 Deficiency of other specified B group vitamins: Secondary | ICD-10-CM | POA: Diagnosis not present

## 2023-05-30 DIAGNOSIS — E1122 Type 2 diabetes mellitus with diabetic chronic kidney disease: Secondary | ICD-10-CM | POA: Diagnosis not present

## 2023-05-30 DIAGNOSIS — K5900 Constipation, unspecified: Secondary | ICD-10-CM | POA: Diagnosis not present

## 2023-05-30 DIAGNOSIS — I1310 Hypertensive heart and chronic kidney disease without heart failure, with stage 1 through stage 4 chronic kidney disease, or unspecified chronic kidney disease: Secondary | ICD-10-CM | POA: Diagnosis not present

## 2023-05-30 DIAGNOSIS — E785 Hyperlipidemia, unspecified: Secondary | ICD-10-CM | POA: Diagnosis not present

## 2023-05-30 DIAGNOSIS — N1832 Chronic kidney disease, stage 3b: Secondary | ICD-10-CM | POA: Diagnosis not present

## 2023-05-30 DIAGNOSIS — T387X5D Adverse effect of androgens and anabolic congeners, subsequent encounter: Secondary | ICD-10-CM | POA: Diagnosis not present

## 2023-05-30 NOTE — Telephone Encounter (Signed)
Received forms from [facility] Printed & placed in provider bin

## 2023-05-30 NOTE — Telephone Encounter (Signed)
Wayne Perkins has been notified

## 2023-05-30 NOTE — Telephone Encounter (Signed)
Ok for verbal orders to proceed 

## 2023-05-30 NOTE — Telephone Encounter (Signed)
Home Health Verbal Orders  Agency: [facility]   Caller: [facility] Call back #: [phone] Fax #:    Requesting OT:    Reason for Request:    Frequency:  1wk1

## 2023-05-30 NOTE — Telephone Encounter (Signed)
Pkay to authorize verbal orders for OT once a week for 1 week

## 2023-05-31 ENCOUNTER — Other Ambulatory Visit: Payer: Self-pay

## 2023-05-31 ENCOUNTER — Telehealth: Payer: Self-pay

## 2023-05-31 ENCOUNTER — Telehealth: Payer: Self-pay | Admitting: Family Medicine

## 2023-05-31 NOTE — Telephone Encounter (Signed)
Placed in folder at nurse station

## 2023-05-31 NOTE — Telephone Encounter (Signed)
Received forms from [facility] Printed & placed in provider bin

## 2023-05-31 NOTE — Telephone Encounter (Signed)
Pt's son called regarding pt's status.  Pt son requested to speak with Dr. [name] regarding the pt's care plan.  Pt's son wanted to know what is the pt's life expectancy w/out further tx options?  Pt's son also inquired about home health care options.  Reviewed pt's chart and saw where pt currently has a home health referral w/[facility] ordered by pt's PCP.  Pt's son stated that [facility] Vilma Prader is currently doing PT with pt w/in the home.  Instructed pt to contact the PCP or [facility] to see if home health aide and home health nurse services can be added on since the pt already has care established with [facility].  Pt's son verbalized understanding and stated he would contact [facility] about adding on those 2 services.  Pt's son requested if Dr. [name] would please contact him so they can discuss the pt's care plan.  Stated this nurse will make Dr. [name] aware of his request and telephone call.  Pt's son had no further questions or concerns at this time.

## 2023-05-31 NOTE — Telephone Encounter (Signed)
Placed in your sign folder  

## 2023-06-01 DIAGNOSIS — N1832 Chronic kidney disease, stage 3b: Secondary | ICD-10-CM | POA: Diagnosis not present

## 2023-06-01 DIAGNOSIS — D631 Anemia in chronic kidney disease: Secondary | ICD-10-CM | POA: Diagnosis not present

## 2023-06-01 DIAGNOSIS — J45909 Unspecified asthma, uncomplicated: Secondary | ICD-10-CM

## 2023-06-01 DIAGNOSIS — E1122 Type 2 diabetes mellitus with diabetic chronic kidney disease: Secondary | ICD-10-CM | POA: Diagnosis not present

## 2023-06-01 DIAGNOSIS — E1165 Type 2 diabetes mellitus with hyperglycemia: Secondary | ICD-10-CM | POA: Diagnosis not present

## 2023-06-01 DIAGNOSIS — K5900 Constipation, unspecified: Secondary | ICD-10-CM | POA: Diagnosis not present

## 2023-06-01 DIAGNOSIS — C61 Malignant neoplasm of prostate: Secondary | ICD-10-CM | POA: Diagnosis not present

## 2023-06-01 DIAGNOSIS — T387X5D Adverse effect of androgens and anabolic congeners, subsequent encounter: Secondary | ICD-10-CM | POA: Diagnosis not present

## 2023-06-01 DIAGNOSIS — E785 Hyperlipidemia, unspecified: Secondary | ICD-10-CM | POA: Diagnosis not present

## 2023-06-01 DIAGNOSIS — I1310 Hypertensive heart and chronic kidney disease without heart failure, with stage 1 through stage 4 chronic kidney disease, or unspecified chronic kidney disease: Secondary | ICD-10-CM | POA: Diagnosis not present

## 2023-06-01 DIAGNOSIS — R1310 Dysphagia, unspecified: Secondary | ICD-10-CM

## 2023-06-01 DIAGNOSIS — E538 Deficiency of other specified B group vitamins: Secondary | ICD-10-CM | POA: Diagnosis not present

## 2023-06-01 DIAGNOSIS — M1990 Unspecified osteoarthritis, unspecified site: Secondary | ICD-10-CM

## 2023-06-01 NOTE — Telephone Encounter (Signed)
Forms signed and returned to [employer]

## 2023-06-01 NOTE — Telephone Encounter (Signed)
Form completed and returned to [employer]

## 2023-06-02 ENCOUNTER — Encounter: Payer: Self-pay | Admitting: Hematology

## 2023-06-02 LAB — GUARDANT 360

## 2023-06-04 ENCOUNTER — Other Ambulatory Visit: Payer: Self-pay | Admitting: Family Medicine

## 2023-06-06 DIAGNOSIS — E785 Hyperlipidemia, unspecified: Secondary | ICD-10-CM | POA: Diagnosis not present

## 2023-06-06 DIAGNOSIS — K5900 Constipation, unspecified: Secondary | ICD-10-CM | POA: Diagnosis not present

## 2023-06-06 DIAGNOSIS — I1310 Hypertensive heart and chronic kidney disease without heart failure, with stage 1 through stage 4 chronic kidney disease, or unspecified chronic kidney disease: Secondary | ICD-10-CM | POA: Diagnosis not present

## 2023-06-06 DIAGNOSIS — N1832 Chronic kidney disease, stage 3b: Secondary | ICD-10-CM | POA: Diagnosis not present

## 2023-06-06 DIAGNOSIS — D631 Anemia in chronic kidney disease: Secondary | ICD-10-CM | POA: Diagnosis not present

## 2023-06-06 DIAGNOSIS — E1165 Type 2 diabetes mellitus with hyperglycemia: Secondary | ICD-10-CM | POA: Diagnosis not present

## 2023-06-06 DIAGNOSIS — E1122 Type 2 diabetes mellitus with diabetic chronic kidney disease: Secondary | ICD-10-CM | POA: Diagnosis not present

## 2023-06-06 DIAGNOSIS — E538 Deficiency of other specified B group vitamins: Secondary | ICD-10-CM | POA: Diagnosis not present

## 2023-06-06 DIAGNOSIS — T387X5D Adverse effect of androgens and anabolic congeners, subsequent encounter: Secondary | ICD-10-CM | POA: Diagnosis not present

## 2023-06-07 ENCOUNTER — Telehealth: Payer: Self-pay

## 2023-06-07 DIAGNOSIS — E538 Deficiency of other specified B group vitamins: Secondary | ICD-10-CM | POA: Diagnosis not present

## 2023-06-07 DIAGNOSIS — I1310 Hypertensive heart and chronic kidney disease without heart failure, with stage 1 through stage 4 chronic kidney disease, or unspecified chronic kidney disease: Secondary | ICD-10-CM | POA: Diagnosis not present

## 2023-06-07 DIAGNOSIS — N1832 Chronic kidney disease, stage 3b: Secondary | ICD-10-CM | POA: Diagnosis not present

## 2023-06-07 DIAGNOSIS — E1165 Type 2 diabetes mellitus with hyperglycemia: Secondary | ICD-10-CM | POA: Diagnosis not present

## 2023-06-07 DIAGNOSIS — K5900 Constipation, unspecified: Secondary | ICD-10-CM | POA: Diagnosis not present

## 2023-06-07 DIAGNOSIS — D631 Anemia in chronic kidney disease: Secondary | ICD-10-CM | POA: Diagnosis not present

## 2023-06-07 DIAGNOSIS — E1122 Type 2 diabetes mellitus with diabetic chronic kidney disease: Secondary | ICD-10-CM | POA: Diagnosis not present

## 2023-06-07 DIAGNOSIS — E785 Hyperlipidemia, unspecified: Secondary | ICD-10-CM | POA: Diagnosis not present

## 2023-06-07 DIAGNOSIS — T387X5D Adverse effect of androgens and anabolic congeners, subsequent encounter: Secondary | ICD-10-CM | POA: Diagnosis not present

## 2023-06-07 NOTE — Telephone Encounter (Signed)
Pt wife called with questions regarding blood sugar monitoring patch. Message forwarded to oncology team for further evaluation.

## 2023-06-08 ENCOUNTER — Encounter: Payer: Medicare HMO | Admitting: Pharmacist

## 2023-06-08 ENCOUNTER — Other Ambulatory Visit: Payer: Self-pay

## 2023-06-10 ENCOUNTER — Other Ambulatory Visit: Payer: Self-pay

## 2023-06-10 ENCOUNTER — Telehealth: Payer: Self-pay

## 2023-06-10 ENCOUNTER — Ambulatory Visit: Payer: Medicare HMO | Admitting: Family Medicine

## 2023-06-10 ENCOUNTER — Encounter: Payer: Self-pay | Admitting: Family Medicine

## 2023-06-10 VITALS — BP 120/64 | HR 88 | Temp 97.9°F | Ht 70.0 in | Wt 146.2 lb

## 2023-06-10 DIAGNOSIS — G893 Neoplasm related pain (acute) (chronic): Secondary | ICD-10-CM

## 2023-06-10 DIAGNOSIS — Z515 Encounter for palliative care: Secondary | ICD-10-CM

## 2023-06-10 DIAGNOSIS — C61 Malignant neoplasm of prostate: Secondary | ICD-10-CM | POA: Diagnosis not present

## 2023-06-10 DIAGNOSIS — E1121 Type 2 diabetes mellitus with diabetic nephropathy: Secondary | ICD-10-CM

## 2023-06-10 DIAGNOSIS — I10 Essential (primary) hypertension: Secondary | ICD-10-CM | POA: Diagnosis not present

## 2023-06-10 DIAGNOSIS — Z7984 Long term (current) use of oral hypoglycemic drugs: Secondary | ICD-10-CM | POA: Diagnosis not present

## 2023-06-10 DIAGNOSIS — E782 Mixed hyperlipidemia: Secondary | ICD-10-CM

## 2023-06-10 LAB — LIPID PANEL
Cholesterol: 185 mg/dL (ref 0–200)
HDL: 48.8 mg/dL (ref >39.00)
LDL Cholesterol: 100 mg/dL — ABNORMAL HIGH (ref 0–99)
NonHDL: 136.21
Total CHOL/HDL Ratio: 4
Triglycerides: 181 mg/dL — ABNORMAL HIGH (ref 0.0–149.0)
VLDL: 36.2 mg/dL (ref 0.0–40.0)

## 2023-06-10 LAB — CBC WITH DIFFERENTIAL/PLATELET
Basophils Absolute: 0.1 10*3/uL (ref 0.0–0.1)
Basophils Relative: 0.9 % (ref 0.0–3.0)
Eosinophils Absolute: 0 10*3/uL (ref 0.0–0.7)
Eosinophils Relative: 0.9 % (ref 0.0–5.0)
HCT: 23.5 % — CL (ref 39.0–52.0)
Hemoglobin: 7.6 g/dL — CL (ref 13.0–17.0)
Lymphocytes Relative: 19.8 % (ref 12.0–46.0)
Lymphs Abs: 1.1 10*3/uL (ref 0.7–4.0)
MCHC: 32.2 g/dL (ref 30.0–36.0)
MCV: 95.4 fL (ref 78.0–100.0)
Monocytes Absolute: 0.7 10*3/uL (ref 0.1–1.0)
Monocytes Relative: 11.8 % (ref 3.0–12.0)
Neutro Abs: 3.7 10*3/uL (ref 1.4–7.7)
Neutrophils Relative %: 66.6 % (ref 43.0–77.0)
Platelets: 194 10*3/uL (ref 150.0–400.0)
RBC: 2.47 Mil/uL — ABNORMAL LOW (ref 4.22–5.81)
RDW: 20.6 % — ABNORMAL HIGH (ref 11.5–15.5)
WBC: 5.6 10*3/uL (ref 4.0–10.5)

## 2023-06-10 LAB — MICROALBUMIN / CREATININE URINE RATIO
Creatinine,U: 99.8 mg/dL
Microalb Creat Ratio: 2 mg/g (ref 0.0–30.0)
Microalb, Ur: 2 mg/dL — ABNORMAL HIGH (ref 0.0–1.9)

## 2023-06-10 LAB — HEPATIC FUNCTION PANEL
ALT: 6 U/L (ref 0–53)
AST: 14 U/L (ref 0–37)
Albumin: 3.3 g/dL — ABNORMAL LOW (ref 3.5–5.2)
Alkaline Phosphatase: 161 U/L — ABNORMAL HIGH (ref 39–117)
Bilirubin, Direct: 0.1 mg/dL (ref 0.0–0.3)
Total Bilirubin: 0.4 mg/dL (ref 0.2–1.2)
Total Protein: 5.6 g/dL — ABNORMAL LOW (ref 6.0–8.3)

## 2023-06-10 LAB — BASIC METABOLIC PANEL
BUN: 31 mg/dL — ABNORMAL HIGH (ref 6–23)
CO2: 25 meq/L (ref 19–32)
Calcium: 8.4 mg/dL (ref 8.4–10.5)
Chloride: 104 meq/L (ref 96–112)
Creatinine, Ser: 1.18 mg/dL (ref 0.40–1.50)
GFR: 57.32 mL/min — ABNORMAL LOW (ref >60.00)
Glucose, Bld: 189 mg/dL — ABNORMAL HIGH (ref 70–99)
Potassium: 4.5 meq/L (ref 3.5–5.1)
Sodium: 140 meq/L (ref 135–145)

## 2023-06-10 LAB — TSH: TSH: 2.22 u[IU]/mL (ref 0.35–5.50)

## 2023-06-10 LAB — HEMOGLOBIN A1C: Hgb A1c MFr Bld: 9.5 % — ABNORMAL HIGH (ref 4.6–6.5)

## 2023-06-10 MED ORDER — OXYCODONE HCL 5 MG PO TABS: 5.0000 mg | ORAL_TABLET | Freq: Four times a day (QID) | ORAL | 0 refills | Status: DC | PRN

## 2023-06-10 NOTE — Assessment & Plan Note (Signed)
Chronic problem, on Crestor $RemoveBe'5mg'NpLpBbyjM$  daily w/o difficulty.  Check labs.  Adjust meds prn

## 2023-06-10 NOTE — Telephone Encounter (Signed)
Lab at [facility] called with critical for patient. Hemoglobin of 7.6 and Hematocrit of 23.5. Repeated back for confirmation. Sent to provider for review

## 2023-06-10 NOTE — Telephone Encounter (Signed)
Pt has been following w/ Oncology and getting regular transfusions when Hgb drops.  Will route this to his Oncologist as they have been coordinating transfusions.  Please let patient know they also need to call the Oncology office to ask about a transfusion in case Dr [name] is not in the office and doesn't see this result.

## 2023-06-10 NOTE — Assessment & Plan Note (Signed)
Refill provided on Oxycodone

## 2023-06-10 NOTE — Progress Notes (Signed)
Subjective:    Patient ID: Wayne Perkins, adult    DOB: 05/21/1941, 82 y.o.   MRN: [id number]  HPI DM- chronic problem, on Trulicity 0.75mg  weekly, Metformin 500mg  BID.  Pt had to reschedule eye exam due to radiation schedule.  Due for foot exam, microalbumin.  Denies symptomatic lows.  No numbness/tingling of hands/feet.  No sores or blisters.  HTN- chronic problem, on Nifedipine 90mg  daily, hydrochlorothiazide 25mg  daily.  No CP, SOB, HA's, edema.  Hyperlipidemia- chronic problem, on Crestor 5mg  daily.  No abd pain, N/V   Review of Systems For ROS see HPI     Objective:   Physical Exam Vitals reviewed.  Constitutional:      General: He is not in acute distress.    Appearance: He is well-developed. He is ill-appearing (but at baseline).  HENT:     Head: Normocephalic and atraumatic.  Eyes:     Extraocular Movements: Extraocular movements intact.     Conjunctiva/sclera: Conjunctivae normal.     Pupils: Pupils are equal, round, and reactive to light.  Neck:     Thyroid: No thyromegaly.  Cardiovascular:     Rate and Rhythm: Normal rate and regular rhythm.     Pulses: Normal pulses.     Heart sounds: Normal heart sounds. No murmur heard. Pulmonary:     Effort: Pulmonary effort is normal. No respiratory distress.     Breath sounds: Normal breath sounds.  Abdominal:     General: Bowel sounds are normal. There is no distension.     Palpations: Abdomen is soft.  Musculoskeletal:     Cervical back: Normal range of motion and neck supple.     Right lower leg: No edema.     Left lower leg: No edema.  Lymphadenopathy:     Cervical: No cervical adenopathy.  Skin:    General: Skin is warm and dry.  Neurological:     General: No focal deficit present.     Mental Status: He is alert and oriented to person, place, and time.     Cranial Nerves: No cranial nerve deficit.  Psychiatric:        Mood and Affect: Mood normal.        Behavior: Behavior normal.            Assessment & Plan:

## 2023-06-10 NOTE — Patient Instructions (Signed)
Follow up in 3 months to recheck sugar We'll notify you of your lab results and make any changes if needed Continue to work on healthy diet and regular physical activity- you're doing great!!! Reschedule your eye exam Call with any questions or concerns Happy Fall!!!

## 2023-06-10 NOTE — Assessment & Plan Note (Signed)
Chronic problem.  On Trulicity 0.75mg  weekly.  Metformin was added when he was in the hospital.  They are not sure if they should continue this.  Will get labs and determine if we need to refill medication.  Encouraged him to reschedule eye exam.  Foot exam done today.  Microalbumin ordered

## 2023-06-10 NOTE — Assessment & Plan Note (Signed)
Chronic problem. Well controlled on Nifedipine and hydrochlorothiazide.  Currently asymptomatic.  Check labs due to diuretic use but no anticipated med changes.  Will follow.

## 2023-06-13 ENCOUNTER — Ambulatory Visit (HOSPITAL_BASED_OUTPATIENT_CLINIC_OR_DEPARTMENT_OTHER): Payer: Medicare HMO | Admitting: Family

## 2023-06-13 ENCOUNTER — Telehealth: Payer: Self-pay | Admitting: Hematology

## 2023-06-13 ENCOUNTER — Other Ambulatory Visit: Payer: Self-pay

## 2023-06-13 ENCOUNTER — Encounter (HOSPITAL_BASED_OUTPATIENT_CLINIC_OR_DEPARTMENT_OTHER): Payer: Self-pay | Admitting: Family

## 2023-06-13 VITALS — BP 116/60 | HR 86 | Ht 70.0 in | Wt 143.2 lb

## 2023-06-13 DIAGNOSIS — I493 Ventricular premature depolarization: Secondary | ICD-10-CM

## 2023-06-13 DIAGNOSIS — I10 Essential (primary) hypertension: Secondary | ICD-10-CM

## 2023-06-13 DIAGNOSIS — E785 Hyperlipidemia, unspecified: Secondary | ICD-10-CM | POA: Diagnosis not present

## 2023-06-13 DIAGNOSIS — I25118 Atherosclerotic heart disease of native coronary artery with other forms of angina pectoris: Secondary | ICD-10-CM

## 2023-06-13 DIAGNOSIS — Z8673 Personal history of transient ischemic attack (TIA), and cerebral infarction without residual deficits: Secondary | ICD-10-CM | POA: Diagnosis not present

## 2023-06-13 MED ORDER — NITROGLYCERIN 0.4 MG SL SUBL: 0.4000 mg | SUBLINGUAL_TABLET | SUBLINGUAL | 3 refills | Status: AC | PRN

## 2023-06-13 NOTE — Progress Notes (Unsigned)
Cardiology Office Note:  .   Date:  06/13/2023  ID:  Adriana Simas, DOB 1940-09-29, MRN [id number] PCP: Sheliah Hatch, MD  [facility] Providers Cardiologist:  Jodelle Red, MD { Click to update primary MD,subspecialty MD or APP then REFRESH:1}   History of Present Illness: .   Daevion Steffan is a 82 y.o. adult ***with a hx of TIA/CVA, CAd s/p prior stents, left chest wall lipoma, HTN, DM2 with nephropathy/CKD, HLD, malignant prostate cancer last seen 08/09/22.   Prior PCI in 2004 to "the left side" per his report by Dr. Marlan Palau at [facility]. Anginal equivalent of bilateral hand numbness, loss of consciousness. In 2010 had PCI=RCA by Dr. Cory Roughen at [facility] in [place].   Last seen 06/30/22 noting heartburn resolved with mild. Occasional episode of chest pain which he attributed to lipoma with sensation of "pulling". He also noted myalgias. BP was elevated which he attributed to pain and medication changes deferred.   Called 08/05/22 with chest pain a week prior while exerting himself. 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 While loading up the truck felt a "heavy pressure". After loading he sat down and relaxed and it passed after 10 minutes. Since that time noted he did not have to do much exertional around the house without noting the "tinge" of pressure. It is mostly left side. It feels different than his previous "pulling" with his lipoma. In reviewing his anginal equivalent, at time of first PCI felt some heaviness and at time of second PCI felt something different and felt something wasn't right. He was started on Imdur.    He presents today for follow up with his wife. He is excited to watch his son and daughter in law's dogs this week. Tells me since starting the Imdur felt like he could breath better and his exercise tolerance is improved. Also feels his legs have been less  sore. He has not noted headache on Imdur. He is understandably frustrated as he has not yet received results from PET scan 08/09/22 - appears oncology tried to call him but was unable to reach him. He says when he called back the recommended number he got a busy signal.***  Admitted 9/4-05/05/23 with steroid induced hyperglycemia (glucose at cancer center 777_. Discharged on Farxiga. Steroids were reduced and weaning. Pseudohyponatremia and hypokalemia were corrected.   Had 2 minutes of PVC bigeminy.   Occasional twinges in nhis left chest and right chest. Last night did take a tyleolc.   Occasional episodes of lightheadedness, dizziness Planning on infusion Thursday.    ROS: ***  Studies Reviewed: .        *** Risk Assessment/Calculations:   {Does this patient have ATRIAL FIBRILLATION?:[phone]}         Physical Exam:   VS:  BP 116/60 (BP Location: Left Arm, Patient Position: Sitting, Cuff Size: Normal)   Pulse 86   Ht 5\' 10"  (1.778 m)   Wt 143 lb 3.2 oz (65 kg)   SpO2 97%   BMI 20.55 kg/m    Wt Readings from Last 3 Encounters:  06/13/23 143 lb 3.2 oz (65 kg)  06/10/23 146 lb 3.2 oz (66.3 kg)  05/25/23 142 lb 3.2 oz (64.5 kg)    GEN: Well nourished, well developed in no acute distress NECK: No JVD; No carotid bruits CARDIAC: ***RRR, no murmurs, rubs, gallops RESPIRATORY:  Clear to auscultation without  rales, wheezing or rhonchi  ABDOMEN: Soft, non-tender, non-distended EXTREMITIES:  No edema; No deformity   ASSESSMENT AND PLAN: .   ***    {Are you ordering a CV Procedure (e.g. stress test, cath, DCCV, TEE, etc)?   Press F2        :[id number]}  Dispo: ***  Signed, Alver Sorrow, NP

## 2023-06-13 NOTE — Patient Instructions (Signed)
Medication Instructions:  Your physician has recommended you make the following change in your medication:   Stop: Imdur   Stop: hydrochlorothiazide   Follow-Up: At [facility], you and your health needs are our priority.  As part of our continuing mission to provide you with exceptional heart care, we have created designated Provider Care Teams.  These Care Teams include your primary Cardiologist (physician) and Advanced Practice Providers (APPs -  Physician Assistants and Nurse Practitioners) who all work together to provide you with the care you need, when you need it.  We recommend signing up for the patient portal called "MyChart".  Sign up information is provided on this After Visit Summary.  MyChart is used to connect with patients for Virtual Visits (Telemedicine).  Patients are able to view lab/test results, encounter notes, upcoming appointments, etc.  Non-urgent messages can be sent to your provider as well.   To learn more about what you can do with MyChart, go to [email or web address].    Your next appointment:   3 months with Dr. Cristal Deer or Gillian Shields, NP

## 2023-06-14 ENCOUNTER — Telehealth: Payer: Self-pay

## 2023-06-14 ENCOUNTER — Other Ambulatory Visit: Payer: Self-pay

## 2023-06-14 DIAGNOSIS — E785 Hyperlipidemia, unspecified: Secondary | ICD-10-CM | POA: Diagnosis not present

## 2023-06-14 DIAGNOSIS — I1310 Hypertensive heart and chronic kidney disease without heart failure, with stage 1 through stage 4 chronic kidney disease, or unspecified chronic kidney disease: Secondary | ICD-10-CM | POA: Diagnosis not present

## 2023-06-14 DIAGNOSIS — E1121 Type 2 diabetes mellitus with diabetic nephropathy: Secondary | ICD-10-CM

## 2023-06-14 DIAGNOSIS — N1832 Chronic kidney disease, stage 3b: Secondary | ICD-10-CM | POA: Diagnosis not present

## 2023-06-14 DIAGNOSIS — D631 Anemia in chronic kidney disease: Secondary | ICD-10-CM | POA: Diagnosis not present

## 2023-06-14 DIAGNOSIS — E538 Deficiency of other specified B group vitamins: Secondary | ICD-10-CM | POA: Diagnosis not present

## 2023-06-14 DIAGNOSIS — K5900 Constipation, unspecified: Secondary | ICD-10-CM | POA: Diagnosis not present

## 2023-06-14 DIAGNOSIS — T387X5D Adverse effect of androgens and anabolic congeners, subsequent encounter: Secondary | ICD-10-CM | POA: Diagnosis not present

## 2023-06-14 DIAGNOSIS — E1165 Type 2 diabetes mellitus with hyperglycemia: Secondary | ICD-10-CM | POA: Diagnosis not present

## 2023-06-14 DIAGNOSIS — E1122 Type 2 diabetes mellitus with diabetic chronic kidney disease: Secondary | ICD-10-CM | POA: Diagnosis not present

## 2023-06-14 MED ORDER — METFORMIN HCL 500 MG PO TABS: 500.0000 mg | ORAL_TABLET | Freq: Two times a day (BID) | ORAL | 1 refills | Status: DC

## 2023-06-14 NOTE — Telephone Encounter (Signed)
-----   Message from Neena Rhymes sent at 06/14/2023  7:19 AM EDT ----- Your sugar is MUCH better than it was before but still on the higher side.  We are going to send in refills on your Metformin (#180, 1 refill) to continue taking twice daily.  This is in addition to your Trulicity.  You are aware of your low hemoglobin and Oncology is working on getting you a transfusion  Remainder of labs are stable.

## 2023-06-15 ENCOUNTER — Inpatient Hospital Stay: Payer: Medicare HMO

## 2023-06-15 ENCOUNTER — Inpatient Hospital Stay: Payer: Medicare HMO | Attending: Oncology

## 2023-06-15 ENCOUNTER — Other Ambulatory Visit: Payer: Self-pay

## 2023-06-15 DIAGNOSIS — C61 Malignant neoplasm of prostate: Secondary | ICD-10-CM

## 2023-06-15 DIAGNOSIS — D630 Anemia in neoplastic disease: Secondary | ICD-10-CM | POA: Diagnosis not present

## 2023-06-15 DIAGNOSIS — C7951 Secondary malignant neoplasm of bone: Secondary | ICD-10-CM | POA: Diagnosis not present

## 2023-06-15 DIAGNOSIS — Z923 Personal history of irradiation: Secondary | ICD-10-CM | POA: Insufficient documentation

## 2023-06-15 DIAGNOSIS — D649 Anemia, unspecified: Secondary | ICD-10-CM

## 2023-06-15 DIAGNOSIS — Z79899 Other long term (current) drug therapy: Secondary | ICD-10-CM | POA: Insufficient documentation

## 2023-06-15 LAB — PREPARE RBC (CROSSMATCH)

## 2023-06-15 LAB — CBC WITH DIFFERENTIAL (CANCER CENTER ONLY)
Abs Immature Granulocytes: 0.26 10*3/uL — ABNORMAL HIGH (ref 0.00–0.07)
Basophils Absolute: 0 10*3/uL (ref 0.0–0.1)
Basophils Relative: 1 %
Eosinophils Absolute: 0.1 10*3/uL (ref 0.0–0.5)
Eosinophils Relative: 1 %
HCT: 21.5 % — ABNORMAL LOW (ref 39.0–52.0)
Hemoglobin: 6.9 g/dL — CL (ref 13.0–17.0)
Immature Granulocytes: 5 %
Lymphocytes Relative: 21 %
Lymphs Abs: 1.2 10*3/uL (ref 0.7–4.0)
MCH: 31.7 pg (ref 26.0–34.0)
MCHC: 32.1 g/dL (ref 30.0–36.0)
MCV: 98.6 fL (ref 80.0–100.0)
Monocytes Absolute: 0.8 10*3/uL (ref 0.1–1.0)
Monocytes Relative: 13 %
Neutro Abs: 3.4 10*3/uL (ref 1.7–7.7)
Neutrophils Relative %: 59 %
Platelet Count: 177 10*3/uL (ref 150–400)
RBC: 2.18 MIL/uL — ABNORMAL LOW (ref 4.22–5.81)
RDW: 19.4 % — ABNORMAL HIGH (ref 11.5–15.5)
WBC Count: 5.7 10*3/uL (ref 4.0–10.5)
nRBC: 1.4 % — ABNORMAL HIGH (ref 0.0–0.2)

## 2023-06-15 LAB — SAMPLE TO BLOOD BANK

## 2023-06-15 MED ORDER — SODIUM CHLORIDE 0.9% IV SOLUTION
250.0000 mL | Freq: Once | INTRAVENOUS | Status: AC
  Administered 2023-06-15: 100 mL via INTRAVENOUS

## 2023-06-15 NOTE — Progress Notes (Signed)
Orders for 2 units of blood entered & T&S. [place] in BB confirmed Prepare and T&S orders.

## 2023-06-16 ENCOUNTER — Ambulatory Visit: Payer: Medicare HMO | Admitting: *Deleted

## 2023-06-16 DIAGNOSIS — Z0000 Encounter for general adult medical examination without abnormal findings: Secondary | ICD-10-CM

## 2023-06-16 LAB — TYPE AND SCREEN
ABO/RH(D): A NEG
Antibody Screen: NEGATIVE
Unit division: 0
Unit division: 0

## 2023-06-16 LAB — BPAM RBC
Blood Product Expiration Date: 202411172359
Blood Product Expiration Date: 202411172359
ISSUE DATE / TIME: 202410161403
ISSUE DATE / TIME: 202410161403
Unit Type and Rh: 600
Unit Type and Rh: 600

## 2023-06-16 NOTE — Patient Instructions (Signed)
[name] , Thank you for taking time to come for your Medicare Wellness Visit. I appreciate your ongoing commitment to your health goals. Please review the following plan we discussed and let me know if I can assist you in the future.   Screening recommendations/referrals: Colonoscopy: no longer required Recommended yearly ophthalmology/optometry visit for glaucoma screening and checkup Recommended yearly dental visit for hygiene and checkup  Vaccinations: Influenza vaccine: Education provided Pneumococcal vaccine: Education provided Tdap vaccine: Education provided Shingles vaccine: Education provided    Advanced directives: yes not on file   Preventive Care 83 Years and Older, Male Preventive care refers to lifestyle choices and visits with your health care provider that can promote health and wellness. What does preventive care include? A yearly physical exam. This is also called an annual well check. Dental exams once or twice a year. Routine eye exams. Ask your health care provider how often you should have your eyes checked. Personal lifestyle choices, including: Daily care of your teeth and gums. Regular physical activity. Eating a healthy diet. Avoiding tobacco and drug use. Limiting alcohol use. Practicing safe sex. Taking low doses of aspirin every day. Taking vitamin and mineral supplements as recommended by your health care provider. What happens during an annual well check? The services and screenings done by your health care provider during your annual well check will depend on your age, overall health, lifestyle risk factors, and family history of disease. Counseling  Your health care provider may ask you questions about your: Alcohol use. Tobacco use. Drug use. Emotional well-being. Home and relationship well-being. Sexual activity. Eating habits. History of falls. Memory and ability to understand (cognition). Work and work Astronomer. Screening  You  may have the following tests or measurements: Height, weight, and BMI. Blood pressure. Lipid and cholesterol levels. These may be checked every 5 years, or more frequently if you are over 83 years old. Skin check. Lung cancer screening. You may have this screening every year starting at age 83 if you have a 30-pack-year history of smoking and currently smoke or have quit within the past 15 years. Fecal occult blood test (FOBT) of the stool. You may have this test every year starting at age 83. Flexible sigmoidoscopy or colonoscopy. You may have a sigmoidoscopy every 5 years or a colonoscopy every 10 years starting at age 83. Prostate cancer screening. Recommendations will vary depending on your family history and other risks. Hepatitis C blood test. Hepatitis B blood test. Sexually transmitted disease (STD) testing. Diabetes screening. This is done by checking your blood sugar (glucose) after you have not eaten for a while (fasting). You may have this done every 1-3 years. Abdominal aortic aneurysm (AAA) screening. You may need this if you are a current or former smoker. Osteoporosis. You may be screened starting at age 67 if you are at high risk. Talk with your health care provider about your test results, treatment options, and if necessary, the need for more tests. Vaccines  Your health care provider may recommend certain vaccines, such as: Influenza vaccine. This is recommended every year. Tetanus, diphtheria, and acellular pertussis (Tdap, Td) vaccine. You may need a Td booster every 10 years. Zoster vaccine. You may need this after age 83. Pneumococcal 13-valent conjugate (PCV13) vaccine. One dose is recommended after age 83. Pneumococcal polysaccharide (PPSV23) vaccine. One dose is recommended after age 83. Talk to your health care provider about which screenings and vaccines you need and how often you need them. This information is  not intended to replace advice given to you by your  health care provider. Make sure you discuss any questions you have with your health care provider. Document Released: 09/12/2015 Document Revised: 05/05/2016 Document Reviewed: 06/17/2015 Elsevier Interactive Patient Education  2017 [employer].  Fall Prevention in the Home Falls can cause injuries. They can happen to people of all ages. There are many things you can do to make your home safe and to help prevent falls. What can I do on the outside of my home? Regularly fix the edges of walkways and driveways and fix any cracks. Remove anything that might make you trip as you walk through a door, such as a raised step or threshold. Trim any bushes or trees on the path to your home. Use bright outdoor lighting. Clear any walking paths of anything that might make someone trip, such as rocks or tools. Regularly check to see if handrails are loose or broken. Make sure that both sides of any steps have handrails. Any raised decks and porches should have guardrails on the edges. Have any leaves, snow, or ice cleared regularly. Use sand or salt on walking paths during winter. Clean up any spills in your garage right away. This includes oil or grease spills. What can I do in the bathroom? Use night lights. Install grab bars by the toilet and in the tub and shower. Do not use towel bars as grab bars. Use non-skid mats or decals in the tub or shower. If you need to sit down in the shower, use a plastic, non-slip stool. Keep the floor dry. Clean up any water that spills on the floor as soon as it happens. Remove soap buildup in the tub or shower regularly. Attach bath mats securely with double-sided non-slip rug tape. Do not have throw rugs and other things on the floor that can make you trip. What can I do in the bedroom? Use night lights. Make sure that you have a light by your bed that is easy to reach. Do not use any sheets or blankets that are too big for your bed. They should not hang down  onto the floor. Have a firm chair that has side arms. You can use this for support while you get dressed. Do not have throw rugs and other things on the floor that can make you trip. What can I do in the kitchen? Clean up any spills right away. Avoid walking on wet floors. Keep items that you use a lot in easy-to-reach places. If you need to reach something above you, use a strong step stool that has a grab bar. Keep electrical cords out of the way. Do not use floor polish or wax that makes floors slippery. If you must use wax, use non-skid floor wax. Do not have throw rugs and other things on the floor that can make you trip. What can I do with my stairs? Do not leave any items on the stairs. Make sure that there are handrails on both sides of the stairs and use them. Fix handrails that are broken or loose. Make sure that handrails are as long as the stairways. Check any carpeting to make sure that it is firmly attached to the stairs. Fix any carpet that is loose or worn. Avoid having throw rugs at the top or bottom of the stairs. If you do have throw rugs, attach them to the floor with carpet tape. Make sure that you have a light switch at the top of the  stairs and the bottom of the stairs. If you do not have them, ask someone to add them for you. What else can I do to help prevent falls? Wear shoes that: Do not have high heels. Have rubber bottoms. Are comfortable and fit you well. Are closed at the toe. Do not wear sandals. If you use a stepladder: Make sure that it is fully opened. Do not climb a closed stepladder. Make sure that both sides of the stepladder are locked into place. Ask someone to hold it for you, if possible. Clearly mark and make sure that you can see: Any grab bars or handrails. First and last steps. Where the edge of each step is. Use tools that help you move around (mobility aids) if they are needed. These include: Canes. Walkers. Scooters. Crutches. Turn  on the lights when you go into a dark area. Replace any light bulbs as soon as they burn out. Set up your furniture so you have a clear path. Avoid moving your furniture around. If any of your floors are uneven, fix them. If there are any pets around you, be aware of where they are. Review your medicines with your doctor. Some medicines can make you feel dizzy. This can increase your chance of falling. Ask your doctor what other things that you can do to help prevent falls. This information is not intended to replace advice given to you by your health care provider. Make sure you discuss any questions you have with your health care provider. Document Released: 06/12/2009 Document Revised: 01/22/2016 Document Reviewed: 09/20/2014 Elsevier Interactive Patient Education  2017 [employer].

## 2023-06-16 NOTE — Progress Notes (Signed)
Subjective:   Wayne Perkins is a 82 y.o. male who presents for Medicare Annual/Subsequent preventive examination.  Visit Complete: Virtual I connected with  Adriana Simas on 06/16/23 by a audio enabled telemedicine application and verified that I am speaking with the correct person using two identifiers.  Patient Location: Home  Provider Location: Home Office  I discussed the limitations of evaluation and management by telemedicine. The patient expressed understanding and agreed to proceed.  Vital Signs: Because this visit was a virtual/telehealth visit, some criteria may be missing or patient reported. Any vitals not documented were not able to be obtained and vitals that have been documented are patient reported.   Cardiac Risk Factors include: advanced age (>27men, >88 women);male gender;family history of premature cardiovascular disease;hypertension     Objective:    There were no vitals filed for this visit. There is no height or weight on file to calculate BMI.     06/16/2023    2:36 PM 05/04/2023    3:51 PM 04/23/2023    7:25 PM 04/19/2023    8:44 AM 04/16/2023    9:57 AM 08/10/2022    9:28 AM 06/15/2022    9:18 AM  Advanced Directives  Does Patient Have a Medical Advance Directive? Yes No No No No Yes Yes  Type of Diplomatic Services operational officer     Living will;Healthcare Power of [employer] Power of [place];Living will  Copy of Healthcare Power of Attorney in Chart? No - copy requested        Would patient like information on creating a medical advance directive?  No - Patient declined No - Patient declined Yes (ED - Information included in AVS) No - Patient declined      Current Medications (verified) Outpatient Encounter Medications as of 06/16/2023  Medication Sig   Alcohol Swabs (B-D SINGLE USE SWABS REGULAR) PADS    Blood Glucose Calibration (ACCU-CHEK AVIVA) SOLN    blood glucose meter kit and supplies Use once a day in the  AM for fasting sugars.   calcium-vitamin D (OSCAL WITH D) 500-200 MG-UNIT tablet Take 1 tablet by mouth 2 (two) times daily.   Cholecalciferol (VITAMIN D3) 50 MCG (2000 UT) TABS Take 2,000 Units by mouth daily.   clopidogrel (PLAVIX) 75 MG tablet TAKE 1 TABLET EVERY DAY   Dulaglutide (TRULICITY) 0.75 MG/0.5ML SOPN Inject 0.75 mg into the skin every Wednesday.   DULoxetine (CYMBALTA) 30 MG capsule Take 1 capsule (30 mg total) by mouth daily.   Ez Smart Blood Glucose Lancets MISC by Does not apply route.   glucose blood test strip Use as instructed   hydrOXYzine (ATARAX) 10 MG tablet TAKE 1 TABLET BY MOUTH 3 TIMES DAILY AS NEEDED FOR ITCHING.   metFORMIN (GLUCOPHAGE) 500 MG tablet Take 1 tablet (500 mg total) by mouth 2 (two) times daily with a meal.   Multiple Vitamin (MULTI-DAY VITAMINS PO) Take 1 tablet by mouth daily with breakfast.   NIFEdipine (PROCARDIA XL/NIFEDICAL-XL) 90 MG 24 hr tablet Take 1 tablet (90 mg total) by mouth daily.   nitroGLYCERIN (NITROSTAT) 0.4 MG SL tablet Place 1 tablet (0.4 mg total) under the tongue every 5 (five) minutes as needed for chest pain.   oxyCODONE (OXY IR/ROXICODONE) 5 MG immediate release tablet Take 1 tablet (5 mg total) by mouth every 6 (six) hours as needed for severe pain.   polyethylene glycol (MIRALAX / GLYCOLAX) 17 g packet Take 8.5 g by mouth in the morning and  at bedtime.   potassium chloride SA (KLOR-CON M) 20 MEQ tablet Take 1 tablet (20 mEq total) by mouth 2 (two) times daily.   rosuvastatin (CRESTOR) 5 MG tablet TAKE 1 TABLET EVERY DAY   TYLENOL 325 MG CAPS Take 325-650 mg by mouth every 6 (six) hours as needed (for pain).   vitamin B-12 (CYANOCOBALAMIN) 1000 MCG tablet Take 1,000 mcg by mouth daily.   No facility-administered encounter medications on file as of 06/16/2023.    Allergies (verified) Tramadol, Dexamethasone, Glipizide, and Statins   History: Past Medical History:  Diagnosis Date   Arthritis    Blood in stool     Childhood asthma    Diabetes mellitus without complication (HCC)    type II   Heart disease    Hypertension, essential    Prostate cancer (HCC)    Stroke [facility])    Past Surgical History:  Procedure Laterality Date   CORONARY ANGIOPLASTY WITH STENT PLACEMENT     GANGLION CYST EXCISION Right    PROSTATE BIOPSY     Family History  Problem Relation Age of Onset   Hyperlipidemia Mother    Hypertension Mother    Stroke Father    Hyperlipidemia Sister    Depression Brother    Early death Brother    Heart disease Brother    Mental illness Brother    Prostate cancer Neg Hx    Breast cancer Neg Hx    Colon cancer Neg Hx    Pancreatic cancer Neg Hx    Social History   Socioeconomic History   Marital status: Married    Spouse name: Talbert Forest   Number of children: 3   Years of education: Not on file   Highest education level: Not on file  Occupational History    Comment: retired  Tobacco Use   Smoking status: Never   Smokeless tobacco: Never   Tobacco comments:    as a Risk analyst Use   Vaping status: Never Used  Substance and Sexual Activity   Alcohol use: Not Currently   Drug use: Never   Sexual activity: Yes    Partners: Female  Other Topics Concern   Not on file  Social History Narrative   Not on file   Social Determinants of Health   Financial Resource Strain: Low Risk  (06/16/2023)   Overall Financial Resource Strain (CARDIA)    Difficulty of Paying Living Expenses: Not hard at all  Food Insecurity: No Food Insecurity (06/16/2023)   Hunger Vital Sign    Worried About Running Out of Food in the Last Year: Never true    Ran Out of Food in the Last Year: Never true  Transportation Needs: No Transportation Needs (06/16/2023)   PRAPARE - Administrator, Civil Service (Medical): No    Lack of Transportation (Non-Medical): No  Physical Activity: Inactive (06/16/2023)   Exercise Vital Sign    Days of Exercise per Week: 0 days    Minutes of  Exercise per Session: 0 min  Stress: No Stress Concern Present (06/16/2023)   [employer] of Occupational Health - Occupational Stress Questionnaire    Feeling of Stress : Not at all  Social Connections: Moderately Integrated (06/16/2023)   Social Connection and Isolation Panel [NHANES]    Frequency of Communication with Friends and Family: More than three times a week    Frequency of Social Gatherings with Friends and Family: More than three times a week    Attends Religious Services: More  than 4 times per year    Active Member of Clubs or Organizations: No    Attends Banker Meetings: Never    Marital Status: Married    Tobacco Counseling Counseling given: Not Answered Tobacco comments: as a Occupational hygienist   Clinical Intake:  Pre-visit preparation completed: Yes  Pain : No/denies pain     Nutritional Risks: None Diabetes: Yes CBG done?: No Did pt. bring in CBG monitor from home?: No  How often do you need to have someone help you when you read instructions, pamphlets, or other written materials from your doctor or pharmacy?: 1 - Never  Interpreter Needed?: No  Information entered by :: Remi Haggard LPN   Activities of Daily Living    06/16/2023    2:37 PM  In your present state of health, do you have any difficulty performing the following activities:  Hearing? 0  Vision? 0  Walking or climbing stairs? 0  Dressing or bathing? 0  Doing errands, shopping? 0  Preparing Food and eating ? N  Using the Toilet? N  In the past six months, have you accidently leaked urine? N  Do you have problems with loss of bowel control? N  Managing your Medications? N  Managing your Finances? N  Housekeeping or managing your Housekeeping? N    Patient Care Team: Sheliah Hatch, MD as PCP - General (Family Medicine) Jodelle Red, MD as PCP - Cardiology (Cardiology) Cindie Laroche, MD as Referring Physician (Urology) Felicita Gage, RN Nurse  Navigator as Registered Nurse (Medical Oncology) Malachy Mood, MD as Consulting Physician (Hematology and Oncology) Pickenpack-Cousar, Arty Baumgartner, NP as Nurse Practitioner [facility] and Palliative Medicine)  Indicate any recent Medical Services you may have received from other than [facility] providers in the past year (date may be approximate).     Assessment:   This is a routine wellness examination for [place].  Hearing/Vision screen Hearing Screening - Comments:: No trouble hearing Vision Screening - Comments:: Up to date groat   Goals Addressed             This Visit's Progress    Patient Stated       Continue current lifestyle       Depression Screen    06/16/2023    2:39 PM 05/11/2023   11:01 AM 04/07/2023    9:26 AM 03/17/2023   10:28 AM 12/09/2022    9:59 AM 09/10/2022   10:13 AM 06/03/2022    8:24 AM  PHQ 2/9 Scores  PHQ - 2 Score 0 0 0 0 0 0 0  PHQ- 9 Score 0 0 0 0 0 2 0    Fall Risk    06/16/2023    2:33 PM 05/11/2023   11:01 AM 04/07/2023    9:26 AM 03/17/2023   10:28 AM 12/09/2022    9:59 AM  Fall Risk   Falls in the past year? 0 0 0 0 0  Number falls in past yr: 0 0 0 0 0  Injury with Fall? 0 0 0 0 0  Risk for fall due to :  No Fall Risks No Fall Risks No Fall Risks No Fall Risks  Follow up Falls evaluation completed;Education provided;Falls prevention discussed Falls evaluation completed Falls evaluation completed Falls evaluation completed Falls evaluation completed    MEDICARE RISK AT HOME: Medicare Risk at Home Any stairs in or around the home?: No If so, are there any without handrails?: No Home free of loose throw rugs in walkways,  pet beds, electrical cords, etc?: Yes Adequate lighting in your home to reduce risk of falls?: Yes Life alert?: No Use of a cane, walker or w/c?: Yes Grab bars in the bathroom?: Yes Shower chair or bench in shower?: Yes Elevated toilet seat or a handicapped toilet?: Yes  TIMED UP AND GO:  Was the test performed?  No     Cognitive Function:        06/16/2023    2:39 PM 06/03/2022    8:26 AM  6CIT Screen  What Year? 0 points 0 points  What month? 0 points 0 points  What time? 0 points 0 points  Count back from 20 0 points 0 points  Months in reverse 0 points 0 points  Repeat phrase 0 points 0 points  Total Score 0 points 0 points    Immunizations Immunization History  Administered Date(s) Administered   Moderna Sars-Covid-2 Vaccination 07/25/2020, 08/24/2020   PFIZER(Purple Top)SARS-COV-2 Vaccination 07/25/2020    TDAP status: Due, Education has been provided regarding the importance of this vaccine. Advised may receive this vaccine at local pharmacy or Health Dept. Aware to provide a copy of the vaccination record if obtained from local pharmacy or Health Dept. Verbalized acceptance and understanding.  Flu Vaccine status: Due, Education has been provided regarding the importance of this vaccine. Advised may receive this vaccine at local pharmacy or Health Dept. Aware to provide a copy of the vaccination record if obtained from local pharmacy or Health Dept. Verbalized acceptance and understanding.  Pneumococcal vaccine status: Due, Education has been provided regarding the importance of this vaccine. Advised may receive this vaccine at local pharmacy or Health Dept. Aware to provide a copy of the vaccination record if obtained from local pharmacy or Health Dept. Verbalized acceptance and understanding.  Covid-19 vaccine status: Declined, Education has been provided regarding the importance of this vaccine but patient still declined. Advised may receive this vaccine at local pharmacy or Health Dept.or vaccine clinic. Aware to provide a copy of the vaccination record if obtained from local pharmacy or Health Dept. Verbalized acceptance and understanding.  Qualifies for Shingles Vaccine? Yes   Zostavax completed No   Shingrix Completed?: No.    Education has been provided regarding the importance of  this vaccine. Patient has been advised to call insurance company to determine out of pocket expense if they have not yet received this vaccine. Advised may also receive vaccine at local pharmacy or Health Dept. Verbalized acceptance and understanding.  Screening Tests Health Maintenance  Topic Date Due   OPHTHALMOLOGY EXAM  04/22/2023   INFLUENZA VACCINE  11/28/2023 (Originally 03/31/2023)   DEXA SCAN  12/09/2023 (Originally 07/31/2005)   Pneumonia Vaccine 45+ Years old (1 of 1 - PCV) 06/15/2024 (Originally 07/31/2005)   HEMOGLOBIN A1C  12/09/2023   Diabetic kidney evaluation - eGFR measurement  06/09/2024   Diabetic kidney evaluation - Urine ACR  06/09/2024   FOOT EXAM  06/09/2024   HPV VACCINES  Aged Out   DTaP/Tdap/Td  Discontinued   COVID-19 Vaccine  Discontinued   Hepatitis C Screening  Discontinued   Zoster Vaccines- Shingrix  Discontinued    Health Maintenance  Health Maintenance Due  Topic Date Due   OPHTHALMOLOGY EXAM  04/22/2023    Colorectal cancer screening: No longer required.   Lung Cancer Screening: (Low Dose CT Chest recommended if Age 35-80 years, 20 pack-year currently smoking OR have quit w/in 15years.) does not qualify.   Lung Cancer Screening Referral:   Additional Screening:  Hepatitis C Screening      never done  Vision Screening: Recommended annual ophthalmology exams for early detection of glaucoma and other disorders of the eye. Is the patient up to date with their annual eye exam?  Yes  Who is the provider or what is the name of the office in which the patient attends annual eye exams? [facility] If pt is not established with a provider, would they like to be referred to a provider to establish care? No .   Dental Screening: Recommended annual dental exams for proper oral hygiene  Nutrition Risk Assessment:  Has the patient had any N/V/D within the last 2 months?  No  Does the patient have any non-healing wounds?  No  Has the patient had any  unintentional weight loss or weight gain?  No   Diabetes:  Is the patient diabetic?  Yes  If diabetic, was a CBG obtained today?  No  Did the patient bring in their glucometer from home?  No  How often do you monitor your CBG's? Does not check often.   Financial Strains and Diabetes Management:  Are you having any financial strains with the device, your supplies or your medication? No .  Does the patient want to be seen by Chronic Care Management for management of their diabetes?  No  Would the patient like to be referred to a Nutritionist or for Diabetic Management?  No   Diabetic Exams:  Diabetic Eye Exam:. Pt has been advised about the importance in completing this exam.  Diabetic Foot Exam: . Pt has been advised about the importance in completing this exam  Community Resource Referral / Chronic Care Management: CRR required this visit?  No   CCM required this visit?  No     Plan:     I have personally reviewed and noted the following in the patient's chart:   Medical and social history Use of alcohol, tobacco or illicit drugs  Current medications and supplements including opioid prescriptions. Patient is not currently taking opioid prescriptions. Functional ability and status Nutritional status Physical activity Advanced directives List of other physicians Hospitalizations, surgeries, and ER visits in previous 12 months Vitals Screenings to include cognitive, depression, and falls Referrals and appointments  In addition, I have reviewed and discussed with patient certain preventive protocols, quality metrics, and best practice recommendations. A written personalized care plan for preventive services as well as general preventive health recommendations were provided to patient.     Remi Haggard, LPN   16/05/[id number]   After Visit Summary: (MyChart) Due to this being a telephonic visit, the after visit summary with patients personalized plan was offered to patient via  MyChart   Nurse Notes:

## 2023-06-22 ENCOUNTER — Telehealth: Payer: Self-pay | Admitting: Family Medicine

## 2023-06-22 NOTE — Telephone Encounter (Signed)
Daughter [place] [phone]  Dropped FMLA paperwork to be completed for her parents (Dad)   Please fax to [facility]  [place]  Fax: [phone]  Chart sheet attached and placed in front bin

## 2023-06-22 NOTE — Telephone Encounter (Signed)
Placed in your sign folder  

## 2023-06-23 ENCOUNTER — Other Ambulatory Visit: Payer: Self-pay

## 2023-06-23 ENCOUNTER — Inpatient Hospital Stay: Payer: Medicare HMO

## 2023-06-23 DIAGNOSIS — C7951 Secondary malignant neoplasm of bone: Secondary | ICD-10-CM | POA: Diagnosis not present

## 2023-06-23 DIAGNOSIS — Z923 Personal history of irradiation: Secondary | ICD-10-CM | POA: Diagnosis not present

## 2023-06-23 DIAGNOSIS — C61 Malignant neoplasm of prostate: Secondary | ICD-10-CM | POA: Diagnosis not present

## 2023-06-23 DIAGNOSIS — Z79899 Other long term (current) drug therapy: Secondary | ICD-10-CM | POA: Diagnosis not present

## 2023-06-23 DIAGNOSIS — D630 Anemia in neoplastic disease: Secondary | ICD-10-CM | POA: Diagnosis not present

## 2023-06-23 DIAGNOSIS — D649 Anemia, unspecified: Secondary | ICD-10-CM

## 2023-06-23 LAB — SAMPLE TO BLOOD BANK

## 2023-06-23 LAB — CBC WITH DIFFERENTIAL (CANCER CENTER ONLY)
Abs Immature Granulocytes: 0.11 10*3/uL — ABNORMAL HIGH (ref 0.00–0.07)
Basophils Absolute: 0 10*3/uL (ref 0.0–0.1)
Basophils Relative: 1 %
Eosinophils Absolute: 0.1 10*3/uL (ref 0.0–0.5)
Eosinophils Relative: 1 %
HCT: 29 % — ABNORMAL LOW (ref 39.0–52.0)
Hemoglobin: 9.3 g/dL — ABNORMAL LOW (ref 13.0–17.0)
Immature Granulocytes: 2 %
Lymphocytes Relative: 19 %
Lymphs Abs: 1.1 10*3/uL (ref 0.7–4.0)
MCH: 31 pg (ref 26.0–34.0)
MCHC: 32.1 g/dL (ref 30.0–36.0)
MCV: 96.7 fL (ref 80.0–100.0)
Monocytes Absolute: 0.8 10*3/uL (ref 0.1–1.0)
Monocytes Relative: 14 %
Neutro Abs: 3.8 10*3/uL (ref 1.7–7.7)
Neutrophils Relative %: 63 %
Platelet Count: 169 10*3/uL (ref 150–400)
RBC: 3 MIL/uL — ABNORMAL LOW (ref 4.22–5.81)
RDW: 16.5 % — ABNORMAL HIGH (ref 11.5–15.5)
WBC Count: 6 10*3/uL (ref 4.0–10.5)
nRBC: 0 % (ref 0.0–0.2)

## 2023-06-23 LAB — CMP (CANCER CENTER ONLY)
ALT: 7 U/L (ref 0–44)
AST: 28 U/L (ref 15–41)
Albumin: 3.6 g/dL (ref 3.5–5.0)
Alkaline Phosphatase: 197 U/L — ABNORMAL HIGH (ref 38–126)
Anion gap: 11 (ref 5–15)
BUN: 35 mg/dL — ABNORMAL HIGH (ref 8–23)
CO2: 26 mmol/L (ref 22–32)
Calcium: 9 mg/dL (ref 8.9–10.3)
Chloride: 103 mmol/L (ref 98–111)
Creatinine: 1.27 mg/dL — ABNORMAL HIGH (ref 0.61–1.24)
GFR, Estimated: 56 mL/min — ABNORMAL LOW (ref >60)
Glucose, Bld: 196 mg/dL — ABNORMAL HIGH (ref 70–99)
Potassium: 4.3 mmol/L (ref 3.5–5.1)
Sodium: 140 mmol/L (ref 135–145)
Total Bilirubin: 0.4 mg/dL (ref 0.3–1.2)
Total Protein: 6.9 g/dL (ref 6.5–8.1)

## 2023-06-23 NOTE — Progress Notes (Signed)
Patient does not meet parameters for blood transfusion today. Hgb 9.3. Copy of labs provided to patient. He states he is very excited and happy with this result and feels great today. He confirms next appts and understands to call Dr. [name] with any concerns or questions in the meantime.

## 2023-06-24 LAB — PROSTATE-SPECIFIC AG, SERUM (LABCORP): Prostate Specific Ag, Serum: 717 ng/mL — ABNORMAL HIGH (ref 0.0–4.0)

## 2023-06-28 ENCOUNTER — Telehealth: Payer: Self-pay | Admitting: Radiation Oncology

## 2023-06-28 ENCOUNTER — Ambulatory Visit
Admission: RE | Admit: 2023-06-28 | Discharge: 2023-06-28 | Disposition: A | Discharge status: Home / Self Care | Payer: Medicare HMO | Source: Ambulatory Visit | Attending: Radiation Oncology | Admitting: Radiation Oncology

## 2023-06-28 DIAGNOSIS — Z0279 Encounter for issue of other medical certificate: Secondary | ICD-10-CM

## 2023-06-28 NOTE — Telephone Encounter (Signed)
Faxed

## 2023-06-28 NOTE — Telephone Encounter (Signed)
10/29 @ 4:19 pm Patient's wife left voicemail concerning patient's post call treatment appointment for today.  Email forwarded to [employer] and copied Darryl Nestle and Ruel Favors, so they are aware.

## 2023-06-28 NOTE — Telephone Encounter (Signed)
Pt checking on status, please advise.

## 2023-06-28 NOTE — Progress Notes (Signed)
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 (as documented in provider EOT note)  The patient was not available for call today. Voicemail left.  The patient is scheduled for ongoing care with Dr. [name] in medical oncology. The patient was encouraged to call if he  develops concerns or questions regarding radiation.   Ruel Favors, LPN

## 2023-06-28 NOTE — Telephone Encounter (Signed)
Pt called to check on the status of paperwork. Stated paperwork has to be turned in by 06/29/23. I did advise her that we have 7-10 days from the date received to get it faxed where it needs to go.

## 2023-06-28 NOTE — Telephone Encounter (Signed)
Form completed and returned to [place]

## 2023-06-29 ENCOUNTER — Telehealth: Payer: Self-pay | Admitting: Radiation Oncology

## 2023-06-29 ENCOUNTER — Telehealth: Payer: Self-pay | Admitting: Family Medicine

## 2023-06-29 DIAGNOSIS — E1122 Type 2 diabetes mellitus with diabetic chronic kidney disease: Secondary | ICD-10-CM | POA: Diagnosis not present

## 2023-06-29 DIAGNOSIS — E785 Hyperlipidemia, unspecified: Secondary | ICD-10-CM | POA: Diagnosis not present

## 2023-06-29 DIAGNOSIS — D631 Anemia in chronic kidney disease: Secondary | ICD-10-CM | POA: Diagnosis not present

## 2023-06-29 DIAGNOSIS — K5900 Constipation, unspecified: Secondary | ICD-10-CM | POA: Diagnosis not present

## 2023-06-29 DIAGNOSIS — E538 Deficiency of other specified B group vitamins: Secondary | ICD-10-CM | POA: Diagnosis not present

## 2023-06-29 DIAGNOSIS — T387X5D Adverse effect of androgens and anabolic congeners, subsequent encounter: Secondary | ICD-10-CM | POA: Diagnosis not present

## 2023-06-29 DIAGNOSIS — I1310 Hypertensive heart and chronic kidney disease without heart failure, with stage 1 through stage 4 chronic kidney disease, or unspecified chronic kidney disease: Secondary | ICD-10-CM | POA: Diagnosis not present

## 2023-06-29 DIAGNOSIS — N1832 Chronic kidney disease, stage 3b: Secondary | ICD-10-CM | POA: Diagnosis not present

## 2023-06-29 DIAGNOSIS — E1165 Type 2 diabetes mellitus with hyperglycemia: Secondary | ICD-10-CM | POA: Diagnosis not present

## 2023-06-29 NOTE — Telephone Encounter (Signed)
Called pt and wife states she will call us back with a fax number

## 2023-06-29 NOTE — Telephone Encounter (Signed)
10/30 @ 1:44 pm patient and patient's wife called back to speak to someone about his missed post treatment call from yesterday.  Called and forwarded call to Darryl Nestle so they are aware.

## 2023-06-29 NOTE — Telephone Encounter (Signed)
Daughter states that someone is calling her personal cell phone as the fax number. Please use this number [phone] for the Human Resources Dept for that [employer] paper work. Please re-fax them today 06/29/2023- Thanks

## 2023-06-30 ENCOUNTER — Telehealth: Payer: Self-pay | Admitting: Family Medicine

## 2023-06-30 NOTE — Telephone Encounter (Signed)
[facility] Discharge Summary for Review Only   Placed in [facility] - No charge sheet attached

## 2023-06-30 NOTE — Telephone Encounter (Signed)
Form placed in folder at nurse station

## 2023-06-30 NOTE — Telephone Encounter (Signed)
Faxed

## 2023-06-30 NOTE — Telephone Encounter (Signed)
Please see previous note. Daughter needs FMLA paperwork faxed too [phone]. She states needs to be faxed today.

## 2023-07-05 NOTE — Telephone Encounter (Signed)
Reviewed. No further action needed.

## 2023-07-07 ENCOUNTER — Other Ambulatory Visit: Payer: Self-pay

## 2023-07-07 ENCOUNTER — Emergency Department (HOSPITAL_BASED_OUTPATIENT_CLINIC_OR_DEPARTMENT_OTHER)
Admission: EM | Admit: 2023-07-07 | Discharge: 2023-07-07 | Disposition: A | Discharge status: Home / Self Care | Payer: Medicare HMO | Attending: Emergency Medicine | Admitting: Emergency Medicine

## 2023-07-07 DIAGNOSIS — R1030 Lower abdominal pain, unspecified: Secondary | ICD-10-CM | POA: Insufficient documentation

## 2023-07-07 DIAGNOSIS — R339 Retention of urine, unspecified: Secondary | ICD-10-CM | POA: Insufficient documentation

## 2023-07-07 LAB — URINALYSIS, ROUTINE W REFLEX MICROSCOPIC
Bilirubin Urine: NEGATIVE
Glucose, UA: NEGATIVE mg/dL
Ketones, ur: NEGATIVE mg/dL
Leukocytes,Ua: NEGATIVE
Nitrite: NEGATIVE
Specific Gravity, Urine: 1.018 (ref 1.005–1.030)
pH: 5.5 (ref 5.0–8.0)

## 2023-07-07 NOTE — ED Provider Notes (Signed)
[place] EMERGENCY DEPARTMENT AT [facility] Provider Note   CSN: [id number] Arrival date & time: 07/07/23  1013     History Chief Complaint  Patient presents with   Urinary Retention    Wayne Perkins is a 82 y.o. adult with history of prostate cancer who presents to the emergency permit today for further evaluation of urinary retention.  He patient last urinated last night.  Unable to urinate today.  Reports associated suprapubic abdominal pain.  Does have a appointment scheduled with his urologist 1 week from today.  He denies any fever or chills.  HPI     Home Medications Prior to Admission medications   Medication Sig Start Date End Date Taking? Authorizing Provider  Alcohol Swabs (B-D SINGLE USE SWABS REGULAR) PADS  11/05/19   [provider]  Blood Glucose Calibration (ACCU-CHEK AVIVA) SOLN  11/05/19   [provider]  blood glucose meter kit and supplies Use once a day in the AM for fasting sugars. 11/02/19   Orland Mustard, MD  calcium-vitamin D (OSCAL WITH D) 500-200 MG-UNIT tablet Take 1 tablet by mouth 2 (two) times daily. 09/04/20   Benjiman Core, MD  Cholecalciferol (VITAMIN D3) 50 MCG (2000 UT) TABS Take 2,000 Units by mouth daily.    [provider]  clopidogrel (PLAVIX) 75 MG tablet TAKE 1 TABLET EVERY DAY 06/06/23   Sheliah Hatch, MD  Dulaglutide (TRULICITY) 0.75 MG/0.5ML SOPN Inject 0.75 mg into the skin every Wednesday. 12/31/19   Orland Mustard, MD  DULoxetine (CYMBALTA) 30 MG capsule Take 1 capsule (30 mg total) by mouth daily. 04/14/23   Malachy Mood, MD  Ez Smart Blood Glucose Lancets MISC by Does not apply route. 10/24/19   Orland Mustard, MD  glucose blood test strip Use as instructed 10/24/19   Orland Mustard, MD  hydrOXYzine (ATARAX) 10 MG tablet TAKE 1 TABLET BY MOUTH 3 TIMES DAILY AS NEEDED FOR ITCHING. 03/17/23   Sheliah Hatch, MD  metFORMIN (GLUCOPHAGE) 500 MG tablet Take 1 tablet (500 mg total) by mouth 2 (two)  times daily with a meal. 06/14/23   Sheliah Hatch, MD  Multiple Vitamin (MULTI-DAY VITAMINS PO) Take 1 tablet by mouth daily with breakfast.    [provider]  NIFEdipine (PROCARDIA XL/NIFEDICAL-XL) 90 MG 24 hr tablet Take 1 tablet (90 mg total) by mouth daily. 03/17/23   Sheliah Hatch, MD  nitroGLYCERIN (NITROSTAT) 0.4 MG SL tablet Place 1 tablet (0.4 mg total) under the tongue every 5 (five) minutes as needed for chest pain. 06/13/23 09/11/23  Alver Sorrow, NP  oxyCODONE (OXY IR/ROXICODONE) 5 MG immediate release tablet Take 1 tablet (5 mg total) by mouth every 6 (six) hours as needed for severe pain. 06/10/23   Sheliah Hatch, MD  polyethylene glycol (MIRALAX / GLYCOLAX) 17 g packet Take 8.5 g by mouth in the morning and at bedtime.    [provider]  potassium chloride SA (KLOR-CON M) 20 MEQ tablet Take 1 tablet (20 mEq total) by mouth 2 (two) times daily. 03/17/23   Sheliah Hatch, MD  rosuvastatin (CRESTOR) 5 MG tablet TAKE 1 TABLET EVERY DAY 12/22/22   Jodelle Red, MD  TYLENOL 325 MG CAPS Take 325-650 mg by mouth every 6 (six) hours as needed (for pain).    [provider]  vitamin B-12 (CYANOCOBALAMIN) 1000 MCG tablet Take 1,000 mcg by mouth daily.    [provider]      Allergies  Tramadol, Dexamethasone, Glipizide, and Statins    Review of Systems   Review of Systems  All other systems reviewed and are negative.   Physical Exam Updated Vital Signs BP (!) 167/91 (BP Location: Left Arm)   Pulse (!) 54   Temp 98.7 F (37.1 C) (Oral)   Resp 18   Ht 5\' 11"  (1.803 m)   Wt 63.5 kg   SpO2 99%   BMI 19.53 kg/m  Physical Exam Vitals and nursing note reviewed.  Constitutional:      Appearance: Normal appearance.  HENT:     Head: Normocephalic and atraumatic.  Eyes:     General:        Right eye: No discharge.        Left eye: No discharge.     Conjunctiva/sclera: Conjunctivae normal.  Pulmonary:      Effort: Pulmonary effort is normal.  Abdominal:     Comments: Suprapubic abdominal tenderness.  Skin:    General: Skin is warm and dry.     Findings: No rash.  Neurological:     General: No focal deficit present.     Mental Status: He is alert.  Psychiatric:        Mood and Affect: Mood normal.        Behavior: Behavior normal.     ED Results / Procedures / Treatments   Labs (all labs ordered are listed, but only abnormal results are displayed) Labs Reviewed  URINALYSIS, ROUTINE W REFLEX MICROSCOPIC - Abnormal; Notable for the following components:      Result Value   Hgb urine dipstick TRACE (*)    Protein, ur TRACE (*)    Bacteria, UA RARE (*)    All other components within normal limits    EKG None  Radiology No results found.  Procedures Procedures    Medications Ordered in ED Medications - No data to display  ED Course/ Medical Decision Making/ A&P   {   Click here for ABCD2, HEART and other calculators  Medical Decision Making Wayne Perkins is a 82 y.o. adult patient who presents to the emergency department today for further evaluation of urinary retention. Patient had 570 mL of urine based on bladder scan.  A coud catheter was placed with good urine output.  Patient's symptoms have completely resolved.  We will leave the catheter in place until he follows up with his urologist 1 week from today.  Strict return precautions were discussed.  He is safe for discharge at this time.  Amount and/or Complexity of Data Reviewed Labs: ordered.    Final Clinical Impression(s) / ED Diagnoses Final diagnoses:  Urinary retention    Rx / DC Orders ED Discharge Orders     None         Teressa Lower, [place] 07/07/23 1143    Laurence Spates, MD 07/07/23 [phone]

## 2023-07-07 NOTE — ED Notes (Signed)
Leg bag attached to leg for d/c.

## 2023-07-07 NOTE — ED Triage Notes (Signed)
C/o unable to urinate since last night. Hx of same.

## 2023-07-07 NOTE — ED Triage Notes (Signed)
C/o unable to urinate since last night. Hx of prostate cancer.

## 2023-07-07 NOTE — Discharge Instructions (Signed)
Please leave the Foley catheter in place and to follow-up with your urologist next week.  You may return to the emergency department for any worsening symptoms.

## 2023-07-07 NOTE — ED Notes (Signed)
Bladder scan result of 573 mLs

## 2023-07-08 ENCOUNTER — Encounter (HOSPITAL_BASED_OUTPATIENT_CLINIC_OR_DEPARTMENT_OTHER): Payer: Self-pay

## 2023-07-08 ENCOUNTER — Other Ambulatory Visit: Payer: Self-pay

## 2023-07-08 ENCOUNTER — Emergency Department (HOSPITAL_BASED_OUTPATIENT_CLINIC_OR_DEPARTMENT_OTHER)
Admission: EM | Admit: 2023-07-08 | Discharge: 2023-07-08 | Disposition: A | Discharge status: Home / Self Care | Payer: Medicare HMO

## 2023-07-08 DIAGNOSIS — R339 Retention of urine, unspecified: Secondary | ICD-10-CM | POA: Diagnosis not present

## 2023-07-08 DIAGNOSIS — T839XXD Unspecified complication of genitourinary prosthetic device, implant and graft, subsequent encounter: Secondary | ICD-10-CM

## 2023-07-08 DIAGNOSIS — T83091A Other mechanical complication of indwelling urethral catheter, initial encounter: Secondary | ICD-10-CM | POA: Diagnosis not present

## 2023-07-08 NOTE — ED Provider Notes (Signed)
[place] EMERGENCY DEPARTMENT AT [facility] Provider Note   CSN: [id number] Arrival date & time: 07/08/23  1413     History  Chief Complaint  Patient presents with   Catheter Pain    Wayne Perkins is a 82 y.o. adult.  82 year old present emergency department with Foley catheter pain.  Had Foley placed yesterday for acute urinary retention.  Notes since placement has had pain to the distal penis and seemingly deeper.  Foley's continue to drain.  He has had prior Foley's with no issues.  He is asking for Foley to be removed.        Home Medications Prior to Admission medications   Medication Sig Start Date End Date Taking? Authorizing Provider  Alcohol Swabs (B-D SINGLE USE SWABS REGULAR) PADS  11/05/19   [provider]  Blood Glucose Calibration (ACCU-CHEK AVIVA) SOLN  11/05/19   [provider]  blood glucose meter kit and supplies Use once a day in the AM for fasting sugars. 11/02/19   Orland Mustard, MD  calcium-vitamin D (OSCAL WITH D) 500-200 MG-UNIT tablet Take 1 tablet by mouth 2 (two) times daily. 09/04/20   Benjiman Core, MD  Cholecalciferol (VITAMIN D3) 50 MCG (2000 UT) TABS Take 2,000 Units by mouth daily.    [provider]  clopidogrel (PLAVIX) 75 MG tablet TAKE 1 TABLET EVERY DAY 06/06/23   Sheliah Hatch, MD  Dulaglutide (TRULICITY) 0.75 MG/0.5ML SOPN Inject 0.75 mg into the skin every Wednesday. 12/31/19   Orland Mustard, MD  DULoxetine (CYMBALTA) 30 MG capsule Take 1 capsule (30 mg total) by mouth daily. 04/14/23   Malachy Mood, MD  Ez Smart Blood Glucose Lancets MISC by Does not apply route. 10/24/19   Orland Mustard, MD  glucose blood test strip Use as instructed 10/24/19   Orland Mustard, MD  hydrOXYzine (ATARAX) 10 MG tablet TAKE 1 TABLET BY MOUTH 3 TIMES DAILY AS NEEDED FOR ITCHING. 03/17/23   Sheliah Hatch, MD  metFORMIN (GLUCOPHAGE) 500 MG tablet Take 1 tablet (500 mg total) by mouth 2 (two) times daily with a meal.  06/14/23   Sheliah Hatch, MD  Multiple Vitamin (MULTI-DAY VITAMINS PO) Take 1 tablet by mouth daily with breakfast.    [provider]  NIFEdipine (PROCARDIA XL/NIFEDICAL-XL) 90 MG 24 hr tablet Take 1 tablet (90 mg total) by mouth daily. 03/17/23   Sheliah Hatch, MD  nitroGLYCERIN (NITROSTAT) 0.4 MG SL tablet Place 1 tablet (0.4 mg total) under the tongue every 5 (five) minutes as needed for chest pain. 06/13/23 09/11/23  Alver Sorrow, NP  oxyCODONE (OXY IR/ROXICODONE) 5 MG immediate release tablet Take 1 tablet (5 mg total) by mouth every 6 (six) hours as needed for severe pain. 06/10/23   Sheliah Hatch, MD  polyethylene glycol (MIRALAX / GLYCOLAX) 17 g packet Take 8.5 g by mouth in the morning and at bedtime.    [provider]  potassium chloride SA (KLOR-CON M) 20 MEQ tablet Take 1 tablet (20 mEq total) by mouth 2 (two) times daily. 03/17/23   Sheliah Hatch, MD  rosuvastatin (CRESTOR) 5 MG tablet TAKE 1 TABLET EVERY DAY 12/22/22   Jodelle Red, MD  TYLENOL 325 MG CAPS Take 325-650 mg by mouth every 6 (six) hours as needed (for pain).    [provider]  vitamin B-12 (CYANOCOBALAMIN) 1000 MCG tablet Take 1,000 mcg by mouth daily.    [provider]      Allergies  Tramadol, Dexamethasone, Glipizide, and Statins    Review of Systems   Review of Systems  Physical Exam Updated Vital Signs BP 129/62 (BP Location: Right Arm)   Pulse 94   Temp 98.7 F (37.1 C)   Resp 16   SpO2 98%  Physical Exam Vitals and nursing note reviewed.  HENT:     Head: Normocephalic and atraumatic.     Mouth/Throat:     Mouth: Mucous membranes are moist.  Eyes:     Conjunctiva/sclera: Conjunctivae normal.  Cardiovascular:     Rate and Rhythm: Normal rate.  Pulmonary:     Effort: Pulmonary effort is normal.  Genitourinary:    Penis: Normal.      Testes: Normal.     Comments: Foley in place.  Amber-colored urine.  No irritation to  the glans.  Nontender. Skin:    General: Skin is warm.     Capillary Refill: Capillary refill takes less than 2 seconds.  Neurological:     Mental Status: He is alert and oriented to person, place, and time.  Psychiatric:        Mood and Affect: Mood normal.        Behavior: Behavior normal.     ED Results / Procedures / Treatments   Labs (all labs ordered are listed, but only abnormal results are displayed) Labs Reviewed - No data to display  EKG None  Radiology No results found.  Procedures Procedures    Medications Ordered in ED Medications - No data to display  ED Course/ Medical Decision Making/ A&P Clinical Course as of 07/08/23 1819  Fri Jul 08, 2023  1816 Patient was able to urinate after removing Foley.  He does not want to replace it. he would like to go home and follow-up with urology. [TY]    Clinical Course User Index [TY] Coral Spikes, DO                                 Medical Decision Making Well-appearing 82 year old male present emergency department for Foley catheter pain after recent placement yesterday for acute urinary retention.  Afebrile vital signs reassuring.  Foley seems to be functioning correctly, but he is having significant pain.  Offered to exchange Foley, but patient states that he just wants the Foley output and attempt to urinate.  Discussed risks of return of acute urinary retention and infection.  He still would like Foley replaced.  We will remove Foley at patient's request.  See ED course for further MDM and disposition.  Amount and/or Complexity of Data Reviewed Labs:     Details: Considered UA, however had negative UA yesterday.         Final Clinical Impression(s) / ED Diagnoses Final diagnoses:  None    Rx / DC Orders ED Discharge Orders     None         Coral Spikes, DO 07/08/23 1819

## 2023-07-08 NOTE — ED Triage Notes (Signed)
Pt c/o pain r/t urinary catheter, placed yesterday. States he's had 3 total "& the other two have been great, but I feel like I can't sit down w this one because it hurts so bad." Pt reports no issue r/t drainage, "it's flowing good, just hurts."

## 2023-07-08 NOTE — Discharge Instructions (Signed)
Please or any felt fevers, chills, chest pain, shortness of breath, abdominal pain, inability to urinate, develop blood in your urine, or any new or worsening symptoms that are concerning to you.

## 2023-07-11 ENCOUNTER — Telehealth: Payer: Self-pay | Admitting: Family Medicine

## 2023-07-11 NOTE — Telephone Encounter (Signed)
Please advise 

## 2023-07-11 NOTE — Telephone Encounter (Signed)
Ok for verbal orders for RN to assess medications.  He may benefit from a pharmacy referral if he is having trouble swallowing as they could help [place] convert meds to liquid if available.  He has a history of significant anemia.  If he is feeling excessively fatigued again, he needs an appt (preferably w/ Oncology) as he may need another blood transfusion.  But the fatigue should be worked up rather than just switching to B12 injections

## 2023-07-11 NOTE — Telephone Encounter (Signed)
Inetta Fermo, RN/Care Manager with [employer] [phone] ext. [phone]. Would like a nurse to come out to pt's house to look at his medications. He is also experiencing difficulty when swallowing any of his medication. Also he is experiencing excessive tiredness which could very well be related to diagnosis but wonders if instead of taking B-12 by mouth if it would be more beneficial as an injection. Home visit once every 1-2 weeks to check on pt and check vitals.

## 2023-07-12 ENCOUNTER — Other Ambulatory Visit: Payer: Self-pay

## 2023-07-12 ENCOUNTER — Telehealth: Payer: Self-pay

## 2023-07-12 DIAGNOSIS — D649 Anemia, unspecified: Secondary | ICD-10-CM

## 2023-07-12 NOTE — Telephone Encounter (Addendum)
Patient called in stating he is feeling very low unable to get around. He stated he feels very weak. Would like to come n for labs to see if he needs blood. He has an appointment for 11/21 but he feels like he will only get worse and can't wait til then. Spoke with Dr. [name] she said it was ok to bring him in for labs to see if he will need blood. Spoke with Boykin Reaper in [facility] and she stated she could work him in tomorrow at 11 am. I called the patient back and made him aware to be here at 10 for labs and 11 for [facility] he voiced full understanding.

## 2023-07-12 NOTE — Telephone Encounter (Signed)
Pt has been informed of the recommendation and will reach out to oncology today

## 2023-07-12 NOTE — Telephone Encounter (Addendum)
LM on secure nurse line to go ahead with the medication assessment

## 2023-07-12 NOTE — Telephone Encounter (Signed)
Called to discuss fatigue with pt and Dr Wille Glaser recommendation that he seen oncology as this is a chronic issue he has been experiencing   No answer LM to call back

## 2023-07-13 ENCOUNTER — Inpatient Hospital Stay: Payer: Medicare HMO | Attending: Hematology

## 2023-07-13 ENCOUNTER — Inpatient Hospital Stay: Payer: Medicare HMO

## 2023-07-13 VITALS — BP 132/78 | HR 89 | Temp 98.3°F | Resp 16 | Wt 141.2 lb

## 2023-07-13 DIAGNOSIS — Z923 Personal history of irradiation: Secondary | ICD-10-CM | POA: Insufficient documentation

## 2023-07-13 DIAGNOSIS — D630 Anemia in neoplastic disease: Secondary | ICD-10-CM | POA: Insufficient documentation

## 2023-07-13 DIAGNOSIS — Z515 Encounter for palliative care: Secondary | ICD-10-CM | POA: Insufficient documentation

## 2023-07-13 DIAGNOSIS — D649 Anemia, unspecified: Secondary | ICD-10-CM

## 2023-07-13 DIAGNOSIS — K5900 Constipation, unspecified: Secondary | ICD-10-CM | POA: Insufficient documentation

## 2023-07-13 DIAGNOSIS — C7951 Secondary malignant neoplasm of bone: Secondary | ICD-10-CM | POA: Insufficient documentation

## 2023-07-13 DIAGNOSIS — C61 Malignant neoplasm of prostate: Secondary | ICD-10-CM | POA: Insufficient documentation

## 2023-07-13 DIAGNOSIS — G893 Neoplasm related pain (acute) (chronic): Secondary | ICD-10-CM | POA: Insufficient documentation

## 2023-07-13 DIAGNOSIS — Z79891 Long term (current) use of opiate analgesic: Secondary | ICD-10-CM | POA: Insufficient documentation

## 2023-07-13 DIAGNOSIS — Z79899 Other long term (current) drug therapy: Secondary | ICD-10-CM | POA: Insufficient documentation

## 2023-07-13 LAB — CBC WITH DIFFERENTIAL (CANCER CENTER ONLY)
Abs Immature Granulocytes: 0.24 10*3/uL — ABNORMAL HIGH (ref 0.00–0.07)
Basophils Absolute: 0 10*3/uL (ref 0.0–0.1)
Basophils Relative: 1 %
Eosinophils Absolute: 0.1 10*3/uL (ref 0.0–0.5)
Eosinophils Relative: 1 %
HCT: 21.8 % — ABNORMAL LOW (ref 39.0–52.0)
Hemoglobin: 7.3 g/dL — ABNORMAL LOW (ref 13.0–17.0)
Immature Granulocytes: 4 %
Lymphocytes Relative: 21 %
Lymphs Abs: 1.2 10*3/uL (ref 0.7–4.0)
MCH: 32.7 pg (ref 26.0–34.0)
MCHC: 33.5 g/dL (ref 30.0–36.0)
MCV: 97.8 fL (ref 80.0–100.0)
Monocytes Absolute: 0.8 10*3/uL (ref 0.1–1.0)
Monocytes Relative: 14 %
Neutro Abs: 3.3 10*3/uL (ref 1.7–7.7)
Neutrophils Relative %: 59 %
Platelet Count: 146 10*3/uL — ABNORMAL LOW (ref 150–400)
RBC: 2.23 MIL/uL — ABNORMAL LOW (ref 4.22–5.81)
RDW: 17.3 % — ABNORMAL HIGH (ref 11.5–15.5)
WBC Count: 5.6 10*3/uL (ref 4.0–10.5)
nRBC: 0.4 % — ABNORMAL HIGH (ref 0.0–0.2)

## 2023-07-13 LAB — CMP (CANCER CENTER ONLY)
ALT: 5 U/L (ref 0–44)
AST: 19 U/L (ref 15–41)
Albumin: 3.5 g/dL (ref 3.5–5.0)
Alkaline Phosphatase: 192 U/L — ABNORMAL HIGH (ref 38–126)
Anion gap: 9 (ref 5–15)
BUN: 28 mg/dL — ABNORMAL HIGH (ref 8–23)
CO2: 25 mmol/L (ref 22–32)
Calcium: 9 mg/dL (ref 8.9–10.3)
Chloride: 106 mmol/L (ref 98–111)
Creatinine: 1.25 mg/dL — ABNORMAL HIGH (ref 0.61–1.24)
GFR, Estimated: 57 mL/min — ABNORMAL LOW (ref >60)
Glucose, Bld: 168 mg/dL — ABNORMAL HIGH (ref 70–99)
Potassium: 4.2 mmol/L (ref 3.5–5.1)
Sodium: 140 mmol/L (ref 135–145)
Total Bilirubin: 0.4 mg/dL (ref <1.2)
Total Protein: 6.7 g/dL (ref 6.5–8.1)

## 2023-07-13 LAB — SAMPLE TO BLOOD BANK

## 2023-07-13 LAB — PREPARE RBC (CROSSMATCH)

## 2023-07-13 MED ORDER — SODIUM CHLORIDE 0.9% IV SOLUTION
250.0000 mL | INTRAVENOUS | Status: DC
  Administered 2023-07-13: 100 mL via INTRAVENOUS

## 2023-07-13 NOTE — Progress Notes (Signed)
Per Dr. [name], ok to increase rate of blood transfusion to 300 ml/hour.

## 2023-07-13 NOTE — Patient Instructions (Signed)
Blood Transfusion, Adult A blood transfusion is a procedure in which you receive blood or a type of blood cell (blood component) through an IV. You may need a blood transfusion when you have a low blood count, which is a low number of any blood cell. This may result from a bleeding disorder, illness, injury, or surgery. The blood may come from a donor, or you may be able to have your own blood collected and stored (autologous blood donation) before a planned surgery. The blood given in a transfusion may be made up of different blood components. You may receive: Red blood cells. These carry oxygen to the cells in the body. Platelets. These help your blood to clot. Plasma. This is the liquid part of your blood. It carries proteins and other substances throughout the body. White blood cells. These help you fight infections. If you have hemophilia or another clotting disorder, you may also receive other types of blood products. Depending on the type of blood product, this procedure may take 1-4 hours to complete. Tell a health care provider about: Any bleeding problems you have. Any previous reactions you have had during a blood transfusion. Any allergies you have. All medicines you are taking, including vitamins, herbs, eye drops, creams, and over-the-counter medicines. Any surgeries you have had. Any medical conditions you have. Whether you are pregnant or may be pregnant. What are the risks? Talk with your health care provider about risks. The most common problems include: A mild allergic reaction, such as red, swollen areas of skin (hives) and itching. Fever or chills. This may be the body's response to new blood cells received. This may occur during or up to 4 hours after the transfusion. More serious problems may include: A serious allergic reaction that causes difficulty breathing or swelling around the face and lips. Transfusion-associated circulatory overload (TACO), or too much fluid in  the lungs. This may cause breathing problems. Transfusion-related acute lung injury (TRALI), which causes breathing difficulty and low oxygen in the blood. This can occur within hours of the transfusion or several days later. Iron overload. This can happen after receiving many blood transfusions over a period of time. Infection or virus being transmitted. This is rare because donated blood is carefully tested before it is given. Hemolytic transfusion reaction. This is rare. It happens when the body's defense system (immune system)tries to attack the new blood cells. Symptoms may include fever, chills, nausea, low blood pressure, and low back or chest pain. Transfusion-associated graft-versus-host disease (TAGVHD). This is rare. It happens when donated cells attack the body's healthy tissues. What happens before the procedure? You will have a blood test to check your blood type. This test is done to know what kind of blood your body will accept and to match it to the donor blood. If you are going to have a planned surgery, you may be able to do an autologous blood donation. This may be done in case you need to have a transfusion. You will have your temperature, blood pressure, and pulse checked before the transfusion. If you have had an allergic reaction to a transfusion in the past, you may be given medicine to help prevent a reaction. This medicine may be given to you by mouth (orally) or through an IV. What happens during the procedure?  An IV will be inserted into one of your veins. The bag of blood will be attached to your IV. The blood will then enter through your vein. Your temperature, blood pressure,   and pulse will be monitored during the transfusion. This monitoring is done to detect early signs of a transfusion reaction. Tell your nurse right away if you have any of these symptoms during the transfusion: Shortness of breath or trouble breathing. Chest or back pain. Fever or  chills. Itching or hives. If you have any signs or symptoms of a reaction, your transfusion will be stopped and you may be given medicine. When the transfusion is complete, your IV will be removed. Pressure may be applied to the IV site for a few minutes. A bandage (dressing)will be applied. The procedure may vary among health care providers and hospitals. What happens after the procedure? Your temperature, blood pressure, pulse, breathing rate, and blood oxygen level will be monitored until you leave the hospital or clinic. Your blood may be tested to see how you have responded to the transfusion. You may be warmed with fluids or blankets to maintain a normal body temperature. If you receive your blood transfusion in an outpatient setting, you will be told whom to contact to report any reactions. Where to find more information Visit the [employer]: redcross.org Summary A blood transfusion is a procedure in which you receive blood or a type of blood cell (blood component) through an IV. The blood given in a transfusion may be made up of different blood components. You may receive red blood cells, platelets, plasma, or white blood cells depending on the condition treated. Your temperature, blood pressure, and pulse will be monitored before, during, and after the transfusion. After the transfusion, your blood may be tested to see how your body has responded. This information is not intended to replace advice given to you by your health care provider. Make sure you discuss any questions you have with your health care provider. Document Revised: 11/13/2021 Document Reviewed: 11/13/2021 Elsevier Patient Education  2024 Elsevier Inc.  

## 2023-07-14 ENCOUNTER — Other Ambulatory Visit: Payer: Self-pay

## 2023-07-14 LAB — TYPE AND SCREEN
ABO/RH(D): A NEG
Antibody Screen: NEGATIVE
Unit division: 0
Unit division: 0

## 2023-07-14 LAB — BPAM RBC
Blood Product Expiration Date: 202411272359
Blood Product Expiration Date: 202411282359
ISSUE DATE / TIME: 202411131302
ISSUE DATE / TIME: 202411131302
Unit Type and Rh: 600
Unit Type and Rh: 600

## 2023-07-18 ENCOUNTER — Encounter: Payer: Self-pay | Admitting: Family Medicine

## 2023-07-18 ENCOUNTER — Ambulatory Visit (INDEPENDENT_AMBULATORY_CARE_PROVIDER_SITE_OTHER): Payer: Medicare HMO | Admitting: Family Medicine

## 2023-07-18 ENCOUNTER — Other Ambulatory Visit: Payer: Self-pay

## 2023-07-18 VITALS — BP 108/42 | HR 89 | Temp 98.2°F | Ht 71.0 in | Wt 143.4 lb

## 2023-07-18 DIAGNOSIS — C61 Malignant neoplasm of prostate: Secondary | ICD-10-CM | POA: Diagnosis not present

## 2023-07-18 DIAGNOSIS — Z515 Encounter for palliative care: Secondary | ICD-10-CM | POA: Diagnosis not present

## 2023-07-18 DIAGNOSIS — G893 Neoplasm related pain (acute) (chronic): Secondary | ICD-10-CM

## 2023-07-18 DIAGNOSIS — Z7189 Other specified counseling: Secondary | ICD-10-CM

## 2023-07-18 DIAGNOSIS — R339 Retention of urine, unspecified: Secondary | ICD-10-CM

## 2023-07-18 MED ORDER — OXYCODONE HCL 5 MG PO TABS: 5.0000 mg | ORAL_TABLET | Freq: Four times a day (QID) | ORAL | 0 refills | Status: DC | PRN

## 2023-07-18 NOTE — Progress Notes (Unsigned)
Palliative Medicine [facility]  [phone] [phone]   Name: Wayne Perkins Date: 07/18/2023 MRN: [id number]  DOB: 04-19-1941  Patient Care Team: Sheliah Hatch, MD as PCP - General (Family Medicine) Jodelle Red, MD as PCP - Cardiology (Cardiology) Cindie Laroche, MD as Referring Physician (Urology) Felicita Gage, RN Nurse Navigator as Registered Nurse (Medical Oncology) Malachy Mood, MD as Consulting Physician (Hematology and Oncology) Pickenpack-Cousar, Arty Baumgartner, NP as Nurse Practitioner (Hospice and Palliative Medicine)    INTERVAL HISTORY: Wayne Perkins is a 82 y.o. adult with  oncologic medical history including malignant neoplasm of prostate (07/2019) with metastatic disease to bone, as well as HLD, hypertension, CAD, CKD stage 3, type 2 diabetes, anemia, and a TIA in 2020.  Palliative ask to see for symptom management and goals of care.   SOCIAL HISTORY:     reports that he has never smoked. He has never used smokeless tobacco. He reports that he does not currently use alcohol. He reports that he does not use drugs.  ADVANCE DIRECTIVES:  None on file  CODE STATUS: Full code  PAST MEDICAL HISTORY: Past Medical History:  Diagnosis Date  . Arthritis   . Blood in stool   . Childhood asthma   . Diabetes mellitus without complication (HCC)    type II  . Heart disease   . Hypertension, essential   . Prostate cancer (HCC)   . Stroke [facility])     ALLERGIES:  is allergic to tramadol, dexamethasone, glipizide, and statins.  MEDICATIONS:  Current Outpatient Medications  Medication Sig Dispense Refill  . Alcohol Swabs (B-D SINGLE USE SWABS REGULAR) PADS     . blood glucose meter kit and supplies Use once a day in the AM for fasting sugars. 1 each 0  . calcium-vitamin D (OSCAL WITH D) 500-200 MG-UNIT tablet Take 1 tablet by mouth 2 (two) times daily. 60 tablet 3  . Cholecalciferol (VITAMIN D3) 50 MCG (2000  UT) TABS Take 2,000 Units by mouth daily.    . clopidogrel (PLAVIX) 75 MG tablet TAKE 1 TABLET EVERY DAY 90 tablet 3  . Dulaglutide (TRULICITY) 0.75 MG/0.5ML SOPN Inject 0.75 mg into the skin every Wednesday. 4 pen 0  . DULoxetine (CYMBALTA) 30 MG capsule Take 1 capsule (30 mg total) by mouth daily. 30 capsule 0  . Ez Smart Blood Glucose Lancets MISC by Does not apply route. 100 each 3  . glucose blood test strip Use as instructed 100 each 3  . hydrOXYzine (ATARAX) 10 MG tablet TAKE 1 TABLET BY MOUTH 3 TIMES DAILY AS NEEDED FOR ITCHING. 270 tablet 1  . metFORMIN (GLUCOPHAGE) 500 MG tablet Take 1 tablet (500 mg total) by mouth 2 (two) times daily with a meal. 180 tablet 1  . Multiple Vitamin (MULTI-DAY VITAMINS PO) Take 1 tablet by mouth daily with breakfast.    . NIFEdipine (PROCARDIA XL/NIFEDICAL-XL) 90 MG 24 hr tablet Take 1 tablet (90 mg total) by mouth daily. 90 tablet 3  . nitroGLYCERIN (NITROSTAT) 0.4 MG SL tablet Place 1 tablet (0.4 mg total) under the tongue every 5 (five) minutes as needed for chest pain. 25 tablet 3  . oxyCODONE (OXY IR/ROXICODONE) 5 MG immediate release tablet Take 1 tablet (5 mg total) by mouth every 6 (six) hours as needed for severe pain (pain score 7-10). 60 tablet 0  . polyethylene glycol (MIRALAX / GLYCOLAX) 17 g packet Take 8.5 g by mouth in the morning and at  bedtime.    . potassium chloride SA (KLOR-CON M) 20 MEQ tablet Take 1 tablet (20 mEq total) by mouth 2 (two) times daily. 180 tablet 3  . rosuvastatin (CRESTOR) 5 MG tablet TAKE 1 TABLET EVERY DAY 90 tablet 3  . TYLENOL 325 MG CAPS Take 325-650 mg by mouth every 6 (six) hours as needed (for pain).    . vitamin B-12 (CYANOCOBALAMIN) 1000 MCG tablet Take 1,000 mcg by mouth daily.     No current facility-administered medications for this visit.    VITAL SIGNS: There were no vitals taken for this visit. There were no vitals filed for this visit.  Estimated body mass index is 20 kg/m as calculated from the  following:   Height as of 07/18/23: 5\' 11"  (1.803 m).   Weight as of 07/18/23: 143 lb 6 oz (65 kg).     Latest Ref Rng & Units 07/13/2023   10:28 AM 06/23/2023    7:49 AM 06/10/2023   10:15 AM  CMP  Glucose 70 - 99 mg/dL 952  841  324   BUN 8 - 23 mg/dL 28  35  31   Creatinine 0.61 - 1.24 mg/dL 4.01  0.27  2.53   Sodium 135 - 145 mmol/L 140  140  140   Potassium 3.5 - 5.1 mmol/L 4.2  4.3  4.5   Chloride 98 - 111 mmol/L 106  103  104   CO2 22 - 32 mmol/L 25  26  25    Calcium 8.9 - 10.3 mg/dL 9.0  9.0  8.4   Total Protein 6.5 - 8.1 g/dL 6.7  6.9  5.6   Total Bilirubin <1.2 mg/dL 0.4  0.4  0.4   Alkaline Phos 38 - 126 U/L 192  197  161   AST 15 - 41 U/L 19  28  14    ALT 0 - 44 U/L 5  7  6      PERFORMANCE STATUS (ECOG) : 1 - Symptomatic but completely ambulatory   Physical Exam General: NAD Cardiovascular: regular rate and rhythm Pulmonary: clear ant fields Abdomen: soft, nontender, + bowel sounds Extremities: no edema, no joint deformities Skin: no rashes Neurological: AAO x4  IMPRESSION: Mr. Saade presents to clinic for follow-up.  No acute distress noted.  Patient is in wheelchair.  Family is present.  Denies nausea, vomiting, constipation, or diarrhea.  Appetite is fair.  Neoplasm related pain Mr. Chickering reports his pain is much improved and minimal at this time. He is appreciative of this.  States he is only requiring pain medication at bedtime as this is when his pain is most noticeable in his lower extremities.   At this time given significant improvement he understands we will not make any adjustments to his regimen. We will continue to closely monitor and support in addition to his Oncology/Radiation team. He knows to contact the office with any changes.    Constipation Constipation has improved with twice daily Miralax and Senna. Patient reports last bowel movement this morning without difficulty. Encouraged to continue with regimen.   Goals of Care  04/21/23- We  discussed his current illness and what it means in the larger context of his on-going co-morbidities. Natural disease trajectory and expectations were discussed.   Mr. Hammersley and his wife are realist in their understanding of his current illness. They are appreciative of the care and support that he is receiving. They are remaining hopeful for ongoing improvement and clear in expressed wishes to continue to treat  the treatable allowing him every opportunity to continue thriving while managing his symptoms and focusing on improving his quality of life.  We discussed Her current illness and what it means in the larger context of Her on-going co-morbidities. Natural disease trajectory and expectations were discussed.  I discussed the importance of continued conversation with family and their medical providers regarding overall plan of care and treatment options, ensuring decisions are within the context of the patients values and GOCs.  PLAN:  Continue oxycodone at bedtime as needed. Not requiring daily.  Miralax twice daily Senna 2 tabs at bedtime. If no bowel movement for more than 48 hours increase to twice daily.  Ongoing goals of care and symptom management support I will plan to see patient back in 2-3 weeks in collaboration to other oncology appointments.    Patient expressed understanding and was in agreement with this plan. He also understands that He can call the clinic at any time with any questions, concerns, or complaints.   Any controlled substances utilized were prescribed in the context of palliative care. PDMP has been reviewed.    Visit consisted of counseling and education dealing with the complex and emotionally intense issues of symptom management and palliative care in the setting of serious and potentially life-threatening illness.Greater than 50%  of this time was spent counseling and coordinating care related to the above assessment and plan.  Willette Alma,  AGPCNP-BC  Palliative Medicine Team/[facility]  *Please note that this is a verbal dictation therefore any spelling or grammatical errors are due to the "[employer]agon [employer] One" system interpretation.

## 2023-07-18 NOTE — Progress Notes (Signed)
   Subjective:    Patient ID: Wayne Perkins, adult    DOB: 1941/03/23, 82 y.o.   MRN: [id number]  HPI Medication questions- currently pt is managing his own medication.  Wife is concerned if he is not able to do his own medication that she doesn't know what they are or what they are for.  Wants to know how much Tylenol he can take daily.  Is currently taking Oxycodone 5mg  twice daily for metastatic bone pain.    Inability to void- pt has had ~3 episodes.  Each required a trip to ER for catheterization.   Wife is asking for referral to Urology to possibly get supplies for self cath.  Pt reports that when his bowels are moving more regularly, he has less issues w/ urination.   Review of Systems For ROS see HPI     Objective:   Physical Exam Vitals reviewed.  Constitutional:      General: He is not in acute distress.    Appearance: Normal appearance.  HENT:     Head: Normocephalic and atraumatic.  Cardiovascular:     Rate and Rhythm: Normal rate and regular rhythm.  Pulmonary:     Effort: Pulmonary effort is normal. No respiratory distress.  Skin:    General: Skin is warm and dry.     Coloration: Skin is pale. Skin is not jaundiced.  Neurological:     Mental Status: He is alert and oriented to person, place, and time.  Psychiatric:        Mood and Affect: Mood normal.        Behavior: Behavior normal.        Thought Content: Thought content normal.           Assessment & Plan:  Medication review and counseling- new.  Reviewed each medication and what it is for w/ both pt and wife.  Reviewed timing of medications and how many times/when each should be taken.  Pt very clear on what he is taking and why but wife needed to be educated in case pt is unable to manage his own meds.  Inability to urinate- pt has had multiple trips to the ER for similar.  Was told that he may be able to self cath at home.  Will refer to urology to see if this is appropriate.  Did discuss that  when constipated, this will put pressure on his urinary system and make urinating more difficult.  Pt admits that he has less urinary issues when bowels are moving.  Reviewed importance of bowel regimen when on pain medications.  Prostate Cancer/metastatic bone pain- ongoing issue.  Refill provided on Oxycodone.

## 2023-07-18 NOTE — Patient Instructions (Signed)
Follow up as needed or as scheduled No med changes at this time I put in the referral to urology to see if we can get supplies for self cath USE the Oxycodone as needed! Call with any questions or concerns Happy Thanksgiving!!!

## 2023-07-21 ENCOUNTER — Inpatient Hospital Stay (HOSPITAL_BASED_OUTPATIENT_CLINIC_OR_DEPARTMENT_OTHER): Payer: Medicare HMO | Admitting: Nurse Practitioner

## 2023-07-21 ENCOUNTER — Inpatient Hospital Stay: Payer: Medicare HMO

## 2023-07-21 ENCOUNTER — Encounter: Payer: Self-pay | Admitting: Nurse Practitioner

## 2023-07-21 ENCOUNTER — Encounter: Payer: Self-pay | Admitting: Hematology

## 2023-07-21 VITALS — BP 144/88 | HR 88 | Temp 98.7°F | Resp 17 | Wt 143.8 lb

## 2023-07-21 DIAGNOSIS — G893 Neoplasm related pain (acute) (chronic): Secondary | ICD-10-CM

## 2023-07-21 DIAGNOSIS — C7951 Secondary malignant neoplasm of bone: Secondary | ICD-10-CM

## 2023-07-21 DIAGNOSIS — Z79899 Other long term (current) drug therapy: Secondary | ICD-10-CM | POA: Diagnosis not present

## 2023-07-21 DIAGNOSIS — C61 Malignant neoplasm of prostate: Secondary | ICD-10-CM

## 2023-07-21 DIAGNOSIS — Z515 Encounter for palliative care: Secondary | ICD-10-CM

## 2023-07-21 DIAGNOSIS — Z923 Personal history of irradiation: Secondary | ICD-10-CM | POA: Diagnosis not present

## 2023-07-21 DIAGNOSIS — Z79891 Long term (current) use of opiate analgesic: Secondary | ICD-10-CM | POA: Diagnosis not present

## 2023-07-21 DIAGNOSIS — D649 Anemia, unspecified: Secondary | ICD-10-CM

## 2023-07-21 DIAGNOSIS — K5900 Constipation, unspecified: Secondary | ICD-10-CM | POA: Diagnosis not present

## 2023-07-21 DIAGNOSIS — D630 Anemia in neoplastic disease: Secondary | ICD-10-CM | POA: Diagnosis not present

## 2023-07-21 LAB — CBC WITH DIFFERENTIAL (CANCER CENTER ONLY)
Abs Immature Granulocytes: 0.05 10*3/uL (ref 0.00–0.07)
Basophils Absolute: 0 10*3/uL (ref 0.0–0.1)
Basophils Relative: 1 %
Eosinophils Absolute: 0 10*3/uL (ref 0.0–0.5)
Eosinophils Relative: 1 %
HCT: 28 % — ABNORMAL LOW (ref 39.0–52.0)
Hemoglobin: 9.2 g/dL — ABNORMAL LOW (ref 13.0–17.0)
Immature Granulocytes: 1 %
Lymphocytes Relative: 19 %
Lymphs Abs: 0.8 10*3/uL (ref 0.7–4.0)
MCH: 31.5 pg (ref 26.0–34.0)
MCHC: 32.9 g/dL (ref 30.0–36.0)
MCV: 95.9 fL (ref 80.0–100.0)
Monocytes Absolute: 0.5 10*3/uL (ref 0.1–1.0)
Monocytes Relative: 11 %
Neutro Abs: 2.9 10*3/uL (ref 1.7–7.7)
Neutrophils Relative %: 67 %
Platelet Count: 128 10*3/uL — ABNORMAL LOW (ref 150–400)
RBC: 2.92 MIL/uL — ABNORMAL LOW (ref 4.22–5.81)
RDW: 17.2 % — ABNORMAL HIGH (ref 11.5–15.5)
WBC Count: 4.3 10*3/uL (ref 4.0–10.5)
nRBC: 0 % (ref 0.0–0.2)

## 2023-07-21 LAB — CMP (CANCER CENTER ONLY)
ALT: 6 U/L (ref 0–44)
AST: 20 U/L (ref 15–41)
Albumin: 3.5 g/dL (ref 3.5–5.0)
Alkaline Phosphatase: 201 U/L — ABNORMAL HIGH (ref 38–126)
Anion gap: 11 (ref 5–15)
BUN: 23 mg/dL (ref 8–23)
CO2: 25 mmol/L (ref 22–32)
Calcium: 8.9 mg/dL (ref 8.9–10.3)
Chloride: 104 mmol/L (ref 98–111)
Creatinine: 1.06 mg/dL (ref 0.61–1.24)
GFR, Estimated: >60 mL/min (ref >60)
Glucose, Bld: 179 mg/dL — ABNORMAL HIGH (ref 70–99)
Potassium: 3.7 mmol/L (ref 3.5–5.1)
Sodium: 140 mmol/L (ref 135–145)
Total Bilirubin: 0.4 mg/dL (ref <1.2)
Total Protein: 6.7 g/dL (ref 6.5–8.1)

## 2023-07-21 LAB — SAMPLE TO BLOOD BANK

## 2023-07-21 NOTE — Progress Notes (Signed)
Per notes in chart, ok to discharge pt with HGB 9.2. Pt informed of plan

## 2023-07-22 ENCOUNTER — Telehealth: Payer: Self-pay

## 2023-07-22 LAB — PROSTATE-SPECIFIC AG, SERUM (LABCORP): Prostate Specific Ag, Serum: 693 ng/mL — ABNORMAL HIGH (ref 0.0–4.0)

## 2023-07-22 NOTE — Telephone Encounter (Signed)
Transition Care Management Follow-up Telephone Call Date of discharge and from where: 07/08/2023 [facility] How have you been since you were released from the hospital? Patient stated he is feeling much better. Any questions or concerns? No  Items Reviewed: Did the pt receive and understand the discharge instructions provided? Yes  Medications obtained and verified?  No medication prescribed this visit. Patient is able to afford his medication. Other? No  Any new allergies since your discharge? No  Dietary orders reviewed? Yes Do you have support at home? Yes   Follow up appointments reviewed:  PCP Hospital f/u appt confirmed? Yes  Scheduled to see Helane Rima. Beverely Low, MD on 07/18/2023 @ [facility] at [facility]. Specialist Hospital f/u appt confirmed? No  Scheduled to see  on  @ . Are transportation arrangements needed? No  If their condition worsens, is the pt aware to call PCP or go to the Emergency Dept.? Yes Was the patient provided with contact information for the PCP's office or ED? Yes Was to pt encouraged to call back with questions or concerns? Yes   [facility]  [facility] Guide Direct Dial: [phone]  Website: [email or web address]

## 2023-07-29 ENCOUNTER — Other Ambulatory Visit: Payer: Self-pay

## 2023-07-29 NOTE — Progress Notes (Signed)
Patient called in stating he was not feel well. States he feels like he does when his blood is low wanted to know if he could come in and have labs done. I spoke with Dr. [name] made her aware. She gave orders for a CBC CMP type and screen and PSA is he hasn't had it done in a while. Made patient aware to come in on Tuesday to have these labs done. Patient choose the day to come in he has family coming in on Monday.

## 2023-08-01 ENCOUNTER — Other Ambulatory Visit: Payer: Self-pay

## 2023-08-01 DIAGNOSIS — C7951 Secondary malignant neoplasm of bone: Secondary | ICD-10-CM

## 2023-08-02 ENCOUNTER — Telehealth: Payer: Self-pay

## 2023-08-02 ENCOUNTER — Other Ambulatory Visit: Payer: Self-pay

## 2023-08-02 ENCOUNTER — Inpatient Hospital Stay: Payer: Medicare HMO | Attending: Hematology

## 2023-08-02 DIAGNOSIS — D649 Anemia, unspecified: Secondary | ICD-10-CM

## 2023-08-02 DIAGNOSIS — D630 Anemia in neoplastic disease: Secondary | ICD-10-CM | POA: Diagnosis not present

## 2023-08-02 DIAGNOSIS — R634 Abnormal weight loss: Secondary | ICD-10-CM | POA: Diagnosis not present

## 2023-08-02 DIAGNOSIS — Z79899 Other long term (current) drug therapy: Secondary | ICD-10-CM | POA: Insufficient documentation

## 2023-08-02 DIAGNOSIS — Z515 Encounter for palliative care: Secondary | ICD-10-CM | POA: Diagnosis not present

## 2023-08-02 DIAGNOSIS — K5900 Constipation, unspecified: Secondary | ICD-10-CM | POA: Diagnosis not present

## 2023-08-02 DIAGNOSIS — Z923 Personal history of irradiation: Secondary | ICD-10-CM | POA: Diagnosis not present

## 2023-08-02 DIAGNOSIS — Z79891 Long term (current) use of opiate analgesic: Secondary | ICD-10-CM | POA: Diagnosis not present

## 2023-08-02 DIAGNOSIS — C61 Malignant neoplasm of prostate: Secondary | ICD-10-CM | POA: Insufficient documentation

## 2023-08-02 DIAGNOSIS — G893 Neoplasm related pain (acute) (chronic): Secondary | ICD-10-CM | POA: Insufficient documentation

## 2023-08-02 DIAGNOSIS — C7951 Secondary malignant neoplasm of bone: Secondary | ICD-10-CM | POA: Diagnosis not present

## 2023-08-02 LAB — CBC WITH DIFFERENTIAL (CANCER CENTER ONLY)
Abs Immature Granulocytes: 0.08 10*3/uL — ABNORMAL HIGH (ref 0.00–0.07)
Basophils Absolute: 0 10*3/uL (ref 0.0–0.1)
Basophils Relative: 0 %
Eosinophils Absolute: 0 10*3/uL (ref 0.0–0.5)
Eosinophils Relative: 1 %
HCT: 23.4 % — ABNORMAL LOW (ref 39.0–52.0)
Hemoglobin: 7.5 g/dL — ABNORMAL LOW (ref 13.0–17.0)
Immature Granulocytes: 2 %
Lymphocytes Relative: 22 %
Lymphs Abs: 1 10*3/uL (ref 0.7–4.0)
MCH: 31 pg (ref 26.0–34.0)
MCHC: 32.1 g/dL (ref 30.0–36.0)
MCV: 96.7 fL (ref 80.0–100.0)
Monocytes Absolute: 0.6 10*3/uL (ref 0.1–1.0)
Monocytes Relative: 14 %
Neutro Abs: 2.7 10*3/uL (ref 1.7–7.7)
Neutrophils Relative %: 61 %
Platelet Count: 120 10*3/uL — ABNORMAL LOW (ref 150–400)
RBC: 2.42 MIL/uL — ABNORMAL LOW (ref 4.22–5.81)
RDW: 17.6 % — ABNORMAL HIGH (ref 11.5–15.5)
WBC Count: 4.5 10*3/uL (ref 4.0–10.5)
nRBC: 0 % (ref 0.0–0.2)

## 2023-08-02 LAB — CMP (CANCER CENTER ONLY)
ALT: 6 U/L (ref 0–44)
AST: 25 U/L (ref 15–41)
Albumin: 3.5 g/dL (ref 3.5–5.0)
Alkaline Phosphatase: 194 U/L — ABNORMAL HIGH (ref 38–126)
Anion gap: 10 (ref 5–15)
BUN: 32 mg/dL — ABNORMAL HIGH (ref 8–23)
CO2: 25 mmol/L (ref 22–32)
Calcium: 8.8 mg/dL — ABNORMAL LOW (ref 8.9–10.3)
Chloride: 105 mmol/L (ref 98–111)
Creatinine: 1.19 mg/dL (ref 0.61–1.24)
GFR, Estimated: >60 mL/min (ref >60)
Glucose, Bld: 175 mg/dL — ABNORMAL HIGH (ref 70–99)
Potassium: 4.2 mmol/L (ref 3.5–5.1)
Sodium: 140 mmol/L (ref 135–145)
Total Bilirubin: 0.4 mg/dL (ref <1.2)
Total Protein: 6.4 g/dL — ABNORMAL LOW (ref 6.5–8.1)

## 2023-08-02 LAB — PREPARE RBC (CROSSMATCH)

## 2023-08-02 LAB — SAMPLE TO BLOOD BANK

## 2023-08-02 NOTE — Telephone Encounter (Signed)
Spoke with pt via telephone to inform pt that his hemoglobin is 7.5 and Dr. [name] would like for the pt to get 1 unit of PRBCs.  Stated pt is scheduled tomorrow 08/03/2023 at 1pm for 1 unit of PRBCs.  Pt confirmed the appt and stated he will be at the cancer center by 1pm.

## 2023-08-03 ENCOUNTER — Other Ambulatory Visit: Payer: Self-pay

## 2023-08-03 ENCOUNTER — Inpatient Hospital Stay: Payer: Medicare HMO

## 2023-08-03 DIAGNOSIS — Z79891 Long term (current) use of opiate analgesic: Secondary | ICD-10-CM | POA: Diagnosis not present

## 2023-08-03 DIAGNOSIS — R634 Abnormal weight loss: Secondary | ICD-10-CM | POA: Diagnosis not present

## 2023-08-03 DIAGNOSIS — C61 Malignant neoplasm of prostate: Secondary | ICD-10-CM | POA: Diagnosis not present

## 2023-08-03 DIAGNOSIS — Z79899 Other long term (current) drug therapy: Secondary | ICD-10-CM | POA: Diagnosis not present

## 2023-08-03 DIAGNOSIS — Z515 Encounter for palliative care: Secondary | ICD-10-CM | POA: Diagnosis not present

## 2023-08-03 DIAGNOSIS — K5900 Constipation, unspecified: Secondary | ICD-10-CM | POA: Diagnosis not present

## 2023-08-03 DIAGNOSIS — D630 Anemia in neoplastic disease: Secondary | ICD-10-CM | POA: Diagnosis not present

## 2023-08-03 DIAGNOSIS — C7951 Secondary malignant neoplasm of bone: Secondary | ICD-10-CM | POA: Diagnosis not present

## 2023-08-03 DIAGNOSIS — G893 Neoplasm related pain (acute) (chronic): Secondary | ICD-10-CM | POA: Diagnosis not present

## 2023-08-03 DIAGNOSIS — D649 Anemia, unspecified: Secondary | ICD-10-CM

## 2023-08-03 LAB — PROSTATE-SPECIFIC AG, SERUM (LABCORP): Prostate Specific Ag, Serum: 712 ng/mL — ABNORMAL HIGH (ref 0.0–4.0)

## 2023-08-03 MED ORDER — SODIUM CHLORIDE 0.9% IV SOLUTION
250.0000 mL | INTRAVENOUS | Status: DC
  Administered 2023-08-03: 250 mL via INTRAVENOUS

## 2023-08-03 NOTE — Patient Instructions (Signed)
Blood Transfusion, Adult A blood transfusion is a procedure in which you receive blood or a type of blood cell (blood component) through an IV. You may need a blood transfusion when you have a low blood count, which is a low number of any blood cell. This may result from a bleeding disorder, illness, injury, or surgery. The blood may come from a donor, or you may be able to have your own blood collected and stored (autologous blood donation) before a planned surgery. The blood given in a transfusion may be made up of different blood components. You may receive: Red blood cells. These carry oxygen to the cells in the body. Platelets. These help your blood to clot. Plasma. This is the liquid part of your blood. It carries proteins and other substances throughout the body. White blood cells. These help you fight infections. If you have hemophilia or another clotting disorder, you may also receive other types of blood products. Depending on the type of blood product, this procedure may take 1-4 hours to complete. Tell a health care provider about: Any bleeding problems you have. Any previous reactions you have had during a blood transfusion. Any allergies you have. All medicines you are taking, including vitamins, herbs, eye drops, creams, and over-the-counter medicines. Any surgeries you have had. Any medical conditions you have. Whether you are pregnant or may be pregnant. What are the risks? Talk with your health care provider about risks. The most common problems include: A mild allergic reaction, such as red, swollen areas of skin (hives) and itching. Fever or chills. This may be the body's response to new blood cells received. This may occur during or up to 4 hours after the transfusion. More serious problems may include: A serious allergic reaction that causes difficulty breathing or swelling around the face and lips. Transfusion-associated circulatory overload (TACO), or too much fluid in  the lungs. This may cause breathing problems. Transfusion-related acute lung injury (TRALI), which causes breathing difficulty and low oxygen in the blood. This can occur within hours of the transfusion or several days later. Iron overload. This can happen after receiving many blood transfusions over a period of time. Infection or virus being transmitted. This is rare because donated blood is carefully tested before it is given. Hemolytic transfusion reaction. This is rare. It happens when the body's defense system (immune system)tries to attack the new blood cells. Symptoms may include fever, chills, nausea, low blood pressure, and low back or chest pain. Transfusion-associated graft-versus-host disease (TAGVHD). This is rare. It happens when donated cells attack the body's healthy tissues. What happens before the procedure? You will have a blood test to check your blood type. This test is done to know what kind of blood your body will accept and to match it to the donor blood. If you are going to have a planned surgery, you may be able to do an autologous blood donation. This may be done in case you need to have a transfusion. You will have your temperature, blood pressure, and pulse checked before the transfusion. If you have had an allergic reaction to a transfusion in the past, you may be given medicine to help prevent a reaction. This medicine may be given to you by mouth (orally) or through an IV. What happens during the procedure?  An IV will be inserted into one of your veins. The bag of blood will be attached to your IV. The blood will then enter through your vein. Your temperature, blood pressure,   and pulse will be monitored during the transfusion. This monitoring is done to detect early signs of a transfusion reaction. Tell your nurse right away if you have any of these symptoms during the transfusion: Shortness of breath or trouble breathing. Chest or back pain. Fever or  chills. Itching or hives. If you have any signs or symptoms of a reaction, your transfusion will be stopped and you may be given medicine. When the transfusion is complete, your IV will be removed. Pressure may be applied to the IV site for a few minutes. A bandage (dressing)will be applied. The procedure may vary among health care providers and hospitals. What happens after the procedure? Your temperature, blood pressure, pulse, breathing rate, and blood oxygen level will be monitored until you leave the hospital or clinic. Your blood may be tested to see how you have responded to the transfusion. You may be warmed with fluids or blankets to maintain a normal body temperature. If you receive your blood transfusion in an outpatient setting, you will be told whom to contact to report any reactions. Where to find more information Visit the [employer]: redcross.org Summary A blood transfusion is a procedure in which you receive blood or a type of blood cell (blood component) through an IV. The blood given in a transfusion may be made up of different blood components. You may receive red blood cells, platelets, plasma, or white blood cells depending on the condition treated. Your temperature, blood pressure, and pulse will be monitored before, during, and after the transfusion. After the transfusion, your blood may be tested to see how your body has responded. This information is not intended to replace advice given to you by your health care provider. Make sure you discuss any questions you have with your health care provider. Document Revised: 11/13/2021 Document Reviewed: 11/13/2021 Elsevier Patient Education  2024 Elsevier Inc.  

## 2023-08-04 ENCOUNTER — Telehealth: Payer: Self-pay | Admitting: Family Medicine

## 2023-08-04 LAB — TYPE AND SCREEN
ABO/RH(D): A NEG
Antibody Screen: NEGATIVE
Unit division: 0

## 2023-08-04 LAB — BPAM RBC
Blood Product Expiration Date: 202412232359
ISSUE DATE / TIME: 202412041310
Unit Type and Rh: 600

## 2023-08-04 NOTE — Telephone Encounter (Signed)
Please disregard, this is for the wife not pt. Wife said she talked to Dr. Beverely Low about it during pt's appointment

## 2023-08-04 NOTE — Telephone Encounter (Signed)
[facility]- they have 2 locations [facility] [employer] [facility] and [place])

## 2023-08-04 NOTE — Telephone Encounter (Signed)
Please advise 

## 2023-08-04 NOTE — Telephone Encounter (Signed)
Caller name: Gaylard Hitt  On [place]?: Yes  Call back number: [phone] (mobile)  Provider they see: Sheliah Hatch, MD  Reason for call: Wife called to see what the name of the hearing place was that Dr. Beverely Low mentioned during their last visit on 07/18/23. I gave the information to her for [facility] in [place]. Would this happen to be the place that was mentioned?

## 2023-08-05 NOTE — Telephone Encounter (Signed)
Called pt and number has been disconnected.

## 2023-08-07 ENCOUNTER — Other Ambulatory Visit: Payer: Self-pay | Admitting: Hematology

## 2023-08-09 DIAGNOSIS — R338 Other retention of urine: Secondary | ICD-10-CM | POA: Diagnosis not present

## 2023-08-09 DIAGNOSIS — C61 Malignant neoplasm of prostate: Secondary | ICD-10-CM | POA: Diagnosis not present

## 2023-08-12 NOTE — Progress Notes (Unsigned)
Palliative Medicine [facility]  [phone]) [phone] [phone]) [phone]   Name: Wayne Perkins Date: 08/12/2023 MRN: [id number]  DOB: February 20, 1941  Patient Care Team: Sheliah Hatch, MD as PCP - General (Family Medicine) Jodelle Red, MD as PCP - Cardiology (Cardiology) Cindie Laroche, MD as Referring Physician (Urology) Felicita Gage, RN Nurse Navigator as Registered Nurse (Medical Oncology) Malachy Mood, MD as Consulting Physician (Hematology and Oncology) Pickenpack-Cousar, Arty Baumgartner, NP as Nurse Practitioner (Hospice and Palliative Medicine)    INTERVAL HISTORY: Wayne Perkins is a 82 y.o. adult with  oncologic medical history including malignant neoplasm of prostate (07/2019) with metastatic disease to bone, as well as HLD, hypertension, CAD, CKD stage 3, type 2 diabetes, anemia, and a TIA in 2020.  Palliative ask to see for symptom management and goals of care.   SOCIAL HISTORY:     reports that he has never smoked. He has never used smokeless tobacco. He reports that he does not currently use alcohol. He reports that he does not use drugs.  ADVANCE DIRECTIVES:  None on file  CODE STATUS: Full code  PAST MEDICAL HISTORY: Past Medical History:  Diagnosis Date   Arthritis    Blood in stool    Childhood asthma    Diabetes mellitus without complication (HCC)    type II   Heart disease    Hypertension, essential    Prostate cancer (HCC)    Stroke (HCC)     ALLERGIES:  is allergic to tramadol, dexamethasone, glipizide, and statins.  MEDICATIONS:  Current Outpatient Medications  Medication Sig Dispense Refill   Alcohol Swabs (B-D SINGLE USE SWABS REGULAR) PADS      blood glucose meter kit and supplies Use once a day in the AM for fasting sugars. 1 each 0   calcium-vitamin D (OSCAL WITH D) 500-200 MG-UNIT tablet Take 1 tablet by mouth 2 (two) times daily. 60 tablet 3   Cholecalciferol (VITAMIN D3) 50 MCG (2000 UT) TABS  Take 2,000 Units by mouth daily.     clopidogrel (PLAVIX) 75 MG tablet TAKE 1 TABLET EVERY DAY 90 tablet 3   Dulaglutide (TRULICITY) 0.75 MG/0.5ML SOPN Inject 0.75 mg into the skin every Wednesday. 4 pen 0   DULoxetine (CYMBALTA) 30 MG capsule Take 1 capsule (30 mg total) by mouth daily. 30 capsule 0   Ez Smart Blood Glucose Lancets MISC by Does not apply route. 100 each 3   glucose blood test strip Use as instructed 100 each 3   hydrOXYzine (ATARAX) 10 MG tablet TAKE 1 TABLET BY MOUTH 3 TIMES DAILY AS NEEDED FOR ITCHING. 270 tablet 1   metFORMIN (GLUCOPHAGE) 500 MG tablet Take 1 tablet (500 mg total) by mouth 2 (two) times daily with a meal. 180 tablet 1   Multiple Vitamin (MULTI-DAY VITAMINS PO) Take 1 tablet by mouth daily with breakfast.     NIFEdipine (PROCARDIA XL/NIFEDICAL-XL) 90 MG 24 hr tablet Take 1 tablet (90 mg total) by mouth daily. 90 tablet 3   nitroGLYCERIN (NITROSTAT) 0.4 MG SL tablet Place 1 tablet (0.4 mg total) under the tongue every 5 (five) minutes as needed for chest pain. 25 tablet 3   oxyCODONE (OXY IR/ROXICODONE) 5 MG immediate release tablet Take 1 tablet (5 mg total) by mouth every 6 (six) hours as needed for severe pain (pain score 7-10). 60 tablet 0   polyethylene glycol (MIRALAX / GLYCOLAX) 17 g packet Take 8.5 g by mouth in the morning and at  bedtime.     potassium chloride SA (KLOR-CON M) 20 MEQ tablet Take 1 tablet (20 mEq total) by mouth 2 (two) times daily. 180 tablet 3   rosuvastatin (CRESTOR) 5 MG tablet TAKE 1 TABLET EVERY DAY 90 tablet 3   TYLENOL 325 MG CAPS Take 325-650 mg by mouth every 6 (six) hours as needed (for pain).     vitamin B-12 (CYANOCOBALAMIN) 1000 MCG tablet Take 1,000 mcg by mouth daily.     No current facility-administered medications for this visit.    VITAL SIGNS: There were no vitals taken for this visit. There were no vitals filed for this visit.  Estimated body mass index is 20.14 kg/m as calculated from the following:   Height  as of 07/18/23: 5\' 11"  (1.803 m).   Weight as of 08/03/23: 144 lb 7 oz (65.5 kg).     Latest Ref Rng & Units 08/02/2023    9:18 AM 07/21/2023    8:15 AM 07/13/2023   10:28 AM  CMP  Glucose 70 - 99 mg/dL 409  811  914   BUN 8 - 23 mg/dL 32  23  28   Creatinine 0.61 - 1.24 mg/dL 7.82  9.56  2.13   Sodium 135 - 145 mmol/L 140  140  140   Potassium 3.5 - 5.1 mmol/L 4.2  3.7  4.2   Chloride 98 - 111 mmol/L 105  104  106   CO2 22 - 32 mmol/L 25  25  25    Calcium 8.9 - 10.3 mg/dL 8.8  8.9  9.0   Total Protein 6.5 - 8.1 g/dL 6.4  6.7  6.7   Total Bilirubin <1.2 mg/dL 0.4  0.4  0.4   Alkaline Phos 38 - 126 U/L 194  201  192   AST 15 - 41 U/L 25  20  19    ALT 0 - 44 U/L 6  6  5      PERFORMANCE STATUS (ECOG) : 1 - Symptomatic but completely ambulatory   Physical Exam General: NAD, in wheelchair Cardiovascular: regular rate and rhythm Pulmonary: normal breathing pattern  Extremities: no edema, no joint deformities Skin: no rashes Neurological: AAO x4  Discussed the use of AI scribe software for clinical note transcription with the patient, who gave verbal consent to proceed.   IMPRESSION:  Mr. Kiracofe presents to clinic for symptom management follow-up. His wife and son are present. Patient in wheelchair. Denies nausea, vomiting, diarrhea. Reports occasional constipation going several days without bowel movements. To manage this, the patient has been taking stool softeners and Miralax, however is not taking daily. Education provided on importance of taking Miralax at minimum once daily. If no improvement recommended increasing to twice daily.   The patient's caregiver noted that the patient spends most of his time on the couch and has been less active due to fatigue. The patient also reported feeling a decrease in energy levels, particularly in the second week after blood transfusions. His appetite has been poor over the past several weeks, noticeable weight loss of 7 pounds since the last  visit. 137lbs today previous weight 144.7lbs.  Previously, the patient was on metformin but discontinued it three weeks ago due to associated stomach pain. The patient has not experienced this pain since stopping the medication. He is concerned about the decrease of appetite. He is requesting appetite stimulant trial to see if this will impact his intake. We discussed use of dronabinol with added benefits with his insomnia and pain. Education  provided on use, efficacy, and potential side effects. Patient and family verbalized understanding.   Mr. Caperton complaints of persistent leg pain. The pain has been managed with oxycodone, taken twice daily, and Tylenol as needed. However, the patient's caregiver expressed concern that the patient might be experiencing more pain than he is willing to admit. The patient also reported disturbed sleep patterns, often waking up in the second half of the night. He states his pain is intermittent but occurs daily. Feels this could be better controlled. We discussed continuing to closely monitoring over the next few weeks. He would then be amendable to medication adjustments at that time.   The patient and family expressed concerns about the frequency of blood transfusions, questioning the underlying cause and the long-term sustainability of this treatment. I answered all questions to the best of my ability also expressing more appropriate discussions would be warranted to be had with Dr. [name]. Education provided on concerns with frequent blood transfusions one begins to get concerned with the development of antibodies. Patient and family verbalized understanding and appreciation.   The patient's caregiver expressed a desire for improved communication regarding appointment changes, preferring phone calls over digital communication.  Goals of Care  04/21/23- We discussed his current illness and what it means in the larger context of his on-going co-morbidities. Natural disease  trajectory and expectations were discussed.   Mr. Asplund and his wife are realist in their understanding of his current illness. They are appreciative of the care and support that he is receiving. They are remaining hopeful for ongoing improvement and clear in expressed wishes to continue to treat the treatable allowing him every opportunity to continue thriving while managing his symptoms and focusing on improving his quality of life.  We discussed Her current illness and what it means in the larger context of Her on-going co-morbidities. Natural disease trajectory and expectations were discussed.  I discussed the importance of continued conversation with family and their medical providers regarding overall plan of care and treatment options, ensuring decisions are within the context of the patients values and GOCs.  PLAN:  Chronic Cancer Related Pain Reports leg pain managed with Oxycodone 5mg  twice daily as needed and Tylenol as needed. Pain is impacting sleep and quality of life. Discussed potential benefits of medical marijuana for pain management. -Continue Oxycodone 5mg  twice daily and Tylenol as needed. -Reassess pain management at next visit.  Constipation Reports infrequent bowel movements. Currently taking stool softeners and Miralax. -Continue Miralax daily, can increase to twice daily if needed. -Continue stool softeners as needed.  Decreased Appetite Reports decreased appetite and recent weight loss. Discussed potential benefits of medical marijuana for appetite stimulation. -Start Marinol 2.5mg  twice daily for appetite stimulation. -Encouraged small, frequent meals and use of nutritional supplements.  Anemia Hemoglobin 7.3, receiving blood transfusions. -Continue with scheduled blood transfusions as needed. -Next appointment on 09/08/2023 for labs and potential blood transfusion. Patient request to move blood to week prior. Will discuss with infusion for 09/01/23 date.    Follow-up in 2-3 weeks to reassess pain management, constipation, and appetite.  Patient expressed understanding and was in agreement with this plan. He also understands that He can call the clinic at any time with any questions, concerns, or complaints.   Any controlled substances utilized were prescribed in the context of palliative care. PDMP has been reviewed.   Visit consisted of counseling and education dealing with the complex and emotionally intense issues of symptom management and palliative care in the  setting of serious and potentially life-threatening illness.  Willette Alma, AGPCNP-BC  Palliative Medicine Team/[facility]

## 2023-08-18 ENCOUNTER — Other Ambulatory Visit: Payer: Self-pay | Admitting: *Deleted

## 2023-08-18 ENCOUNTER — Inpatient Hospital Stay: Payer: Medicare HMO

## 2023-08-18 ENCOUNTER — Other Ambulatory Visit: Payer: Self-pay

## 2023-08-18 ENCOUNTER — Other Ambulatory Visit (HOSPITAL_COMMUNITY): Payer: Self-pay

## 2023-08-18 ENCOUNTER — Encounter: Payer: Self-pay | Admitting: Nurse Practitioner

## 2023-08-18 ENCOUNTER — Other Ambulatory Visit: Payer: Self-pay | Admitting: Nurse Practitioner

## 2023-08-18 ENCOUNTER — Inpatient Hospital Stay (HOSPITAL_BASED_OUTPATIENT_CLINIC_OR_DEPARTMENT_OTHER): Payer: Medicare HMO | Admitting: Nurse Practitioner

## 2023-08-18 ENCOUNTER — Inpatient Hospital Stay: Payer: Medicare HMO | Admitting: Hematology

## 2023-08-18 VITALS — BP 119/71 | HR 87 | Temp 98.1°F | Resp 15 | Wt 137.4 lb

## 2023-08-18 VITALS — BP 154/87 | HR 99 | Temp 98.3°F | Resp 16

## 2023-08-18 DIAGNOSIS — D630 Anemia in neoplastic disease: Secondary | ICD-10-CM | POA: Diagnosis not present

## 2023-08-18 DIAGNOSIS — C7951 Secondary malignant neoplasm of bone: Secondary | ICD-10-CM

## 2023-08-18 DIAGNOSIS — G893 Neoplasm related pain (acute) (chronic): Secondary | ICD-10-CM | POA: Diagnosis not present

## 2023-08-18 DIAGNOSIS — Z7189 Other specified counseling: Secondary | ICD-10-CM

## 2023-08-18 DIAGNOSIS — R530 Neoplastic (malignant) related fatigue: Secondary | ICD-10-CM | POA: Diagnosis not present

## 2023-08-18 DIAGNOSIS — C61 Malignant neoplasm of prostate: Secondary | ICD-10-CM | POA: Diagnosis not present

## 2023-08-18 DIAGNOSIS — Z79899 Other long term (current) drug therapy: Secondary | ICD-10-CM | POA: Diagnosis not present

## 2023-08-18 DIAGNOSIS — D649 Anemia, unspecified: Secondary | ICD-10-CM

## 2023-08-18 DIAGNOSIS — Z515 Encounter for palliative care: Secondary | ICD-10-CM | POA: Diagnosis not present

## 2023-08-18 DIAGNOSIS — R630 Anorexia: Secondary | ICD-10-CM | POA: Diagnosis not present

## 2023-08-18 DIAGNOSIS — Z79891 Long term (current) use of opiate analgesic: Secondary | ICD-10-CM | POA: Diagnosis not present

## 2023-08-18 DIAGNOSIS — R634 Abnormal weight loss: Secondary | ICD-10-CM | POA: Diagnosis not present

## 2023-08-18 DIAGNOSIS — K5900 Constipation, unspecified: Secondary | ICD-10-CM | POA: Diagnosis not present

## 2023-08-18 LAB — CMP (CANCER CENTER ONLY)
ALT: 6 U/L (ref 0–44)
AST: 23 U/L (ref 15–41)
Albumin: 3.5 g/dL (ref 3.5–5.0)
Alkaline Phosphatase: 217 U/L — ABNORMAL HIGH (ref 38–126)
Anion gap: 12 (ref 5–15)
BUN: 30 mg/dL — ABNORMAL HIGH (ref 8–23)
CO2: 23 mmol/L (ref 22–32)
Calcium: 8.5 mg/dL — ABNORMAL LOW (ref 8.9–10.3)
Chloride: 102 mmol/L (ref 98–111)
Creatinine: 1.16 mg/dL (ref 0.61–1.24)
GFR, Estimated: >60 mL/min (ref >60)
Glucose, Bld: 213 mg/dL — ABNORMAL HIGH (ref 70–99)
Potassium: 3.5 mmol/L (ref 3.5–5.1)
Sodium: 137 mmol/L (ref 135–145)
Total Bilirubin: 0.6 mg/dL (ref <1.2)
Total Protein: 6.5 g/dL (ref 6.5–8.1)

## 2023-08-18 LAB — CBC WITH DIFFERENTIAL (CANCER CENTER ONLY)
Abs Immature Granulocytes: 0.08 10*3/uL — ABNORMAL HIGH (ref 0.00–0.07)
Basophils Absolute: 0 10*3/uL (ref 0.0–0.1)
Basophils Relative: 0 %
Eosinophils Absolute: 0 10*3/uL (ref 0.0–0.5)
Eosinophils Relative: 0 %
HCT: 22.7 % — ABNORMAL LOW (ref 39.0–52.0)
Hemoglobin: 7.3 g/dL — ABNORMAL LOW (ref 13.0–17.0)
Immature Granulocytes: 2 %
Lymphocytes Relative: 15 %
Lymphs Abs: 0.7 10*3/uL (ref 0.7–4.0)
MCH: 29.2 pg (ref 26.0–34.0)
MCHC: 32.2 g/dL (ref 30.0–36.0)
MCV: 90.8 fL (ref 80.0–100.0)
Monocytes Absolute: 0.6 10*3/uL (ref 0.1–1.0)
Monocytes Relative: 13 %
Neutro Abs: 3.2 10*3/uL (ref 1.7–7.7)
Neutrophils Relative %: 70 %
Platelet Count: 104 10*3/uL — ABNORMAL LOW (ref 150–400)
RBC: 2.5 MIL/uL — ABNORMAL LOW (ref 4.22–5.81)
RDW: 20.5 % — ABNORMAL HIGH (ref 11.5–15.5)
WBC Count: 4.6 10*3/uL (ref 4.0–10.5)
nRBC: 0.4 % — ABNORMAL HIGH (ref 0.0–0.2)

## 2023-08-18 LAB — PREPARE RBC (CROSSMATCH)

## 2023-08-18 LAB — SAMPLE TO BLOOD BANK

## 2023-08-18 MED ORDER — DRONABINOL 2.5 MG PO CAPS
2.5000 mg | ORAL_CAPSULE | Freq: Two times a day (BID) | ORAL | 0 refills | Status: DC
  Filled 2023-08-18: qty 60, 30d supply, fill #0

## 2023-08-18 MED ORDER — LEUPROLIDE ACETATE (3 MONTH) 22.5 MG ~~LOC~~ KIT
22.5000 mg | PACK | Freq: Once | SUBCUTANEOUS | Status: AC
Start: 2023-08-18 — End: 2023-08-18
  Administered 2023-08-18: 22.5 mg via SUBCUTANEOUS
  Filled 2023-08-18: qty 22.5

## 2023-08-18 MED ORDER — SODIUM CHLORIDE 0.9% IV SOLUTION
250.0000 mL | INTRAVENOUS | Status: DC
  Administered 2023-08-18: 100 mL via INTRAVENOUS

## 2023-08-18 NOTE — Patient Instructions (Signed)

## 2023-08-19 LAB — TYPE AND SCREEN
ABO/RH(D): A NEG
Antibody Screen: NEGATIVE
Unit division: 0
Unit division: 0

## 2023-08-19 LAB — BPAM RBC
Blood Product Expiration Date: 202412312359
Blood Product Expiration Date: 202412312359
ISSUE DATE / TIME: 202412191126
ISSUE DATE / TIME: 202412191126
Unit Type and Rh: 600
Unit Type and Rh: 600

## 2023-08-25 ENCOUNTER — Other Ambulatory Visit: Payer: Self-pay

## 2023-08-25 DIAGNOSIS — D649 Anemia, unspecified: Secondary | ICD-10-CM

## 2023-08-25 DIAGNOSIS — C7951 Secondary malignant neoplasm of bone: Secondary | ICD-10-CM

## 2023-08-26 ENCOUNTER — Inpatient Hospital Stay: Payer: Medicare HMO

## 2023-08-26 DIAGNOSIS — D649 Anemia, unspecified: Secondary | ICD-10-CM

## 2023-08-26 DIAGNOSIS — R634 Abnormal weight loss: Secondary | ICD-10-CM | POA: Diagnosis not present

## 2023-08-26 DIAGNOSIS — Z515 Encounter for palliative care: Secondary | ICD-10-CM | POA: Diagnosis not present

## 2023-08-26 DIAGNOSIS — C61 Malignant neoplasm of prostate: Secondary | ICD-10-CM | POA: Diagnosis not present

## 2023-08-26 DIAGNOSIS — D630 Anemia in neoplastic disease: Secondary | ICD-10-CM | POA: Diagnosis not present

## 2023-08-26 DIAGNOSIS — C7951 Secondary malignant neoplasm of bone: Secondary | ICD-10-CM | POA: Diagnosis not present

## 2023-08-26 DIAGNOSIS — G893 Neoplasm related pain (acute) (chronic): Secondary | ICD-10-CM | POA: Diagnosis not present

## 2023-08-26 DIAGNOSIS — Z79891 Long term (current) use of opiate analgesic: Secondary | ICD-10-CM | POA: Diagnosis not present

## 2023-08-26 DIAGNOSIS — Z79899 Other long term (current) drug therapy: Secondary | ICD-10-CM | POA: Diagnosis not present

## 2023-08-26 DIAGNOSIS — K5900 Constipation, unspecified: Secondary | ICD-10-CM | POA: Diagnosis not present

## 2023-08-26 LAB — CMP (CANCER CENTER ONLY)
ALT: 6 U/L (ref 0–44)
AST: 21 U/L (ref 15–41)
Albumin: 3.4 g/dL — ABNORMAL LOW (ref 3.5–5.0)
Alkaline Phosphatase: 207 U/L — ABNORMAL HIGH (ref 38–126)
Anion gap: 12 (ref 5–15)
BUN: 40 mg/dL — ABNORMAL HIGH (ref 8–23)
CO2: 24 mmol/L (ref 22–32)
Calcium: 8.6 mg/dL — ABNORMAL LOW (ref 8.9–10.3)
Chloride: 102 mmol/L (ref 98–111)
Creatinine: 1.29 mg/dL — ABNORMAL HIGH (ref 0.61–1.24)
GFR, Estimated: 55 mL/min — ABNORMAL LOW (ref >60)
Glucose, Bld: 227 mg/dL — ABNORMAL HIGH (ref 70–99)
Potassium: 3.5 mmol/L (ref 3.5–5.1)
Sodium: 138 mmol/L (ref 135–145)
Total Bilirubin: 0.5 mg/dL (ref <1.2)
Total Protein: 6.6 g/dL (ref 6.5–8.1)

## 2023-08-26 LAB — CBC WITH DIFFERENTIAL (CANCER CENTER ONLY)
Abs Immature Granulocytes: 0.13 10*3/uL — ABNORMAL HIGH (ref 0.00–0.07)
Basophils Absolute: 0 10*3/uL (ref 0.0–0.1)
Basophils Relative: 0 %
Eosinophils Absolute: 0 10*3/uL (ref 0.0–0.5)
Eosinophils Relative: 0 %
HCT: 27.6 % — ABNORMAL LOW (ref 39.0–52.0)
Hemoglobin: 9 g/dL — ABNORMAL LOW (ref 13.0–17.0)
Immature Granulocytes: 3 %
Lymphocytes Relative: 20 %
Lymphs Abs: 0.9 10*3/uL (ref 0.7–4.0)
MCH: 28.8 pg (ref 26.0–34.0)
MCHC: 32.6 g/dL (ref 30.0–36.0)
MCV: 88.5 fL (ref 80.0–100.0)
Monocytes Absolute: 0.6 10*3/uL (ref 0.1–1.0)
Monocytes Relative: 13 %
Neutro Abs: 2.8 10*3/uL (ref 1.7–7.7)
Neutrophils Relative %: 64 %
Platelet Count: 84 10*3/uL — ABNORMAL LOW (ref 150–400)
RBC: 3.12 MIL/uL — ABNORMAL LOW (ref 4.22–5.81)
RDW: 20.8 % — ABNORMAL HIGH (ref 11.5–15.5)
WBC Count: 4.5 10*3/uL (ref 4.0–10.5)
nRBC: 0 % (ref 0.0–0.2)

## 2023-08-26 LAB — SAMPLE TO BLOOD BANK

## 2023-08-27 ENCOUNTER — Inpatient Hospital Stay: Payer: Medicare HMO

## 2023-09-02 ENCOUNTER — Telehealth: Payer: Self-pay

## 2023-09-02 ENCOUNTER — Other Ambulatory Visit: Payer: Self-pay

## 2023-09-02 ENCOUNTER — Inpatient Hospital Stay: Payer: Medicare HMO | Attending: Hematology

## 2023-09-02 ENCOUNTER — Other Ambulatory Visit: Payer: Self-pay | Admitting: *Deleted

## 2023-09-02 ENCOUNTER — Other Ambulatory Visit: Payer: Self-pay | Admitting: Family Medicine

## 2023-09-02 DIAGNOSIS — C7951 Secondary malignant neoplasm of bone: Secondary | ICD-10-CM | POA: Insufficient documentation

## 2023-09-02 DIAGNOSIS — Z923 Personal history of irradiation: Secondary | ICD-10-CM | POA: Diagnosis not present

## 2023-09-02 DIAGNOSIS — Z79891 Long term (current) use of opiate analgesic: Secondary | ICD-10-CM | POA: Diagnosis not present

## 2023-09-02 DIAGNOSIS — D649 Anemia, unspecified: Secondary | ICD-10-CM

## 2023-09-02 DIAGNOSIS — G893 Neoplasm related pain (acute) (chronic): Secondary | ICD-10-CM | POA: Insufficient documentation

## 2023-09-02 DIAGNOSIS — Z515 Encounter for palliative care: Secondary | ICD-10-CM | POA: Diagnosis not present

## 2023-09-02 DIAGNOSIS — R5383 Other fatigue: Secondary | ICD-10-CM | POA: Diagnosis not present

## 2023-09-02 DIAGNOSIS — C61 Malignant neoplasm of prostate: Secondary | ICD-10-CM

## 2023-09-02 DIAGNOSIS — D630 Anemia in neoplastic disease: Secondary | ICD-10-CM | POA: Insufficient documentation

## 2023-09-02 DIAGNOSIS — Z79899 Other long term (current) drug therapy: Secondary | ICD-10-CM | POA: Diagnosis not present

## 2023-09-02 LAB — CBC WITH DIFFERENTIAL (CANCER CENTER ONLY)
Abs Immature Granulocytes: 0.23 10*3/uL — ABNORMAL HIGH (ref 0.00–0.07)
Basophils Absolute: 0 10*3/uL (ref 0.0–0.1)
Basophils Relative: 0 %
Eosinophils Absolute: 0 10*3/uL (ref 0.0–0.5)
Eosinophils Relative: 0 %
HCT: 23.5 % — ABNORMAL LOW (ref 39.0–52.0)
Hemoglobin: 7.7 g/dL — ABNORMAL LOW (ref 13.0–17.0)
Immature Granulocytes: 5 %
Lymphocytes Relative: 20 %
Lymphs Abs: 1 10*3/uL (ref 0.7–4.0)
MCH: 29.2 pg (ref 26.0–34.0)
MCHC: 32.8 g/dL (ref 30.0–36.0)
MCV: 89 fL (ref 80.0–100.0)
Monocytes Absolute: 0.6 10*3/uL (ref 0.1–1.0)
Monocytes Relative: 12 %
Neutro Abs: 3.1 10*3/uL (ref 1.7–7.7)
Neutrophils Relative %: 63 %
Platelet Count: 83 10*3/uL — ABNORMAL LOW (ref 150–400)
RBC: 2.64 MIL/uL — ABNORMAL LOW (ref 4.22–5.81)
RDW: 20.9 % — ABNORMAL HIGH (ref 11.5–15.5)
WBC Count: 4.9 10*3/uL (ref 4.0–10.5)
nRBC: 0.8 % — ABNORMAL HIGH (ref 0.0–0.2)

## 2023-09-02 LAB — SAMPLE TO BLOOD BANK

## 2023-09-02 LAB — CMP (CANCER CENTER ONLY)
ALT: 5 U/L (ref 0–44)
AST: 25 U/L (ref 15–41)
Albumin: 3.4 g/dL — ABNORMAL LOW (ref 3.5–5.0)
Alkaline Phosphatase: 251 U/L — ABNORMAL HIGH (ref 38–126)
Anion gap: 12 (ref 5–15)
BUN: 33 mg/dL — ABNORMAL HIGH (ref 8–23)
CO2: 24 mmol/L (ref 22–32)
Calcium: 8.7 mg/dL — ABNORMAL LOW (ref 8.9–10.3)
Chloride: 102 mmol/L (ref 98–111)
Creatinine: 1.29 mg/dL — ABNORMAL HIGH (ref 0.61–1.24)
GFR, Estimated: 55 mL/min — ABNORMAL LOW (ref >60)
Glucose, Bld: 160 mg/dL — ABNORMAL HIGH (ref 70–99)
Potassium: 3.9 mmol/L (ref 3.5–5.1)
Sodium: 138 mmol/L (ref 135–145)
Total Bilirubin: 0.5 mg/dL (ref 0.0–1.2)
Total Protein: 6.5 g/dL (ref 6.5–8.1)

## 2023-09-02 LAB — PREPARE RBC (CROSSMATCH)

## 2023-09-02 NOTE — Telephone Encounter (Signed)
 Requested Prescriptions   Pending Prescriptions Disp Refills   oxyCODONE  (OXY IR/ROXICODONE ) 5 MG immediate release tablet 60 tablet 0    Sig: Take 1 tablet (5 mg total) by mouth every 6 (six) hours as needed for severe pain (pain score 7-10).     Date of patient request: 09/02/2023 Last office visit: 07/18/2023 Upcoming visit: Visit date not found Date of last refill: 07/18/2023 Last refill amount: 60

## 2023-09-02 NOTE — Progress Notes (Signed)
 Patient scheduled for transfusion of 1 unit PRBCs 09/03/2023. Follow up with Dr. [name] 09/08/2023.

## 2023-09-02 NOTE — Telephone Encounter (Signed)
 Received message from Pharmacist Coralyn Helling, for Oxycodone 5mg , that PDMP needs to be reviewed. Last filled 07/18/23 by [id number] - [address] in [place], [place].

## 2023-09-02 NOTE — Telephone Encounter (Signed)
 Received a call from pt and wife c/o weakness and fatigue and dizziness without any other symptoms. Per Dr.Feng pt to come in to get labs checked and possible transfusion tomorrow for blood pending labs. Pt and wife agreeable, orders placed per Dr.Feng.

## 2023-09-03 ENCOUNTER — Inpatient Hospital Stay: Payer: Medicare HMO

## 2023-09-03 DIAGNOSIS — Z79899 Other long term (current) drug therapy: Secondary | ICD-10-CM | POA: Diagnosis not present

## 2023-09-03 DIAGNOSIS — C7951 Secondary malignant neoplasm of bone: Secondary | ICD-10-CM | POA: Diagnosis not present

## 2023-09-03 DIAGNOSIS — D649 Anemia, unspecified: Secondary | ICD-10-CM

## 2023-09-03 DIAGNOSIS — R5383 Other fatigue: Secondary | ICD-10-CM | POA: Diagnosis not present

## 2023-09-03 DIAGNOSIS — Z923 Personal history of irradiation: Secondary | ICD-10-CM | POA: Diagnosis not present

## 2023-09-03 DIAGNOSIS — G893 Neoplasm related pain (acute) (chronic): Secondary | ICD-10-CM | POA: Diagnosis not present

## 2023-09-03 DIAGNOSIS — Z515 Encounter for palliative care: Secondary | ICD-10-CM | POA: Diagnosis not present

## 2023-09-03 DIAGNOSIS — C61 Malignant neoplasm of prostate: Secondary | ICD-10-CM | POA: Diagnosis not present

## 2023-09-03 DIAGNOSIS — D630 Anemia in neoplastic disease: Secondary | ICD-10-CM | POA: Diagnosis not present

## 2023-09-03 DIAGNOSIS — Z79891 Long term (current) use of opiate analgesic: Secondary | ICD-10-CM | POA: Diagnosis not present

## 2023-09-03 MED ORDER — SODIUM CHLORIDE 0.9% IV SOLUTION
250.0000 mL | INTRAVENOUS | Status: DC
  Administered 2023-09-03: 10 mL via INTRAVENOUS

## 2023-09-03 NOTE — Patient Instructions (Addendum)
 Blood Transfusion, Adult A blood transfusion is a procedure in which you receive blood through an IV tube. You may need this procedure because of: A bleeding disorder. An illness. An injury. A surgery. The blood may come from someone else (a donor). You may also be able to donate blood for yourself before a surgery. The blood given in a transfusion may be made up of different types of cells. You may get: Red blood cells. These carry oxygen to the cells in the body. Platelets. These help your blood to clot. Plasma. This is the liquid part of your blood. It carries proteins and other substances through the body. White blood cells. These help you fight infections. If you have a clotting disorder, you may also get other types of blood products. Depending on the type of blood product, this procedure may take 1-4 hours to complete. Tell your doctor about: Any bleeding problems you have. Any reactions you have had during a blood transfusion in the past. Any allergies you have. All medicines you are taking, including vitamins, herbs, eye drops, creams, and over-the-counter medicines. Any surgeries you have had. Any medical conditions you have. Whether you are pregnant or may be pregnant. What are the risks? Talk with your health care provider about risks. The most common problems include: A mild allergic reaction. This includes red, swollen areas of skin (hives) and itching. Fever or chills. This may be the body's response to new blood cells received. This may happen during or up to 4 hours after the transfusion. More serious problems may include: A serious allergic reaction. This includes breathing trouble or swelling around the face and lips. Too much fluid in the lungs. This may cause breathing problems. Lung injury. This causes breathing trouble and low oxygen in the blood. This can happen within hours of the transfusion or days later. Too much iron. This can happen after getting many blood  transfusions over a period of time. An infection or virus passed through the blood. This is rare. Donated blood is carefully tested before it is given. Your body's defense system (immune system) trying to attack the new blood cells. This is rare. Symptoms may include fever, chills, nausea, low blood pressure, and low back or chest pain. Donated cells attacking healthy tissues. This is rare. What happens before the procedure? You will have a blood test to find out your blood type. The test also finds out what type of blood your body will accept and matches it to the donor type. If you are going to have a planned surgery, you may be able to donate your own blood. This may be done in case you need a transfusion. You will have your temperature, blood pressure, and pulse checked. You may receive medicine to help prevent an allergic reaction. This may be done if you have had a reaction to a transfusion before. This medicine may be given to you by mouth or through an IV tube. What happens during the procedure?  An IV tube will be put into one of your veins. The bag of blood will be attached to your IV tube. Then, the blood will enter through your vein. Your temperature, blood pressure, and pulse will be checked often. This is done to find early signs of a transfusion reaction. Tell your nurse right away if you have any of these symptoms: Shortness of breath or trouble breathing. Chest or back pain. Fever or chills. Red, swollen areas of skin or itching. If you have any signs  or symptoms of a reaction, your transfusion will be stopped. You may also be given medicine. When the transfusion is finished, your IV tube will be taken out. Pressure may be put on the IV site for a few minutes. A bandage (dressing) will be put on the IV site. The procedure may vary among doctors and hospitals. What happens after the procedure? You will be monitored until you leave the hospital or clinic. This includes  checking your temperature, blood pressure, pulse, breathing rate, and blood oxygen level. Your blood may be tested to see how you have responded to the transfusion. You may be warmed with fluids or blankets. This is done to keep the temperature of your body normal. If you have your procedure in an outpatient setting, you will be told whom to contact to report any reactions. Where to find more information Visit the [employer]: redcross.org Summary A blood transfusion is a procedure in which you receive blood through an IV tube. The blood you are given may be made up of different blood cells. You may receive red blood cells, platelets, plasma, or white blood cells. Your temperature, blood pressure, and pulse will be checked often. After the procedure, your blood may be tested to see how you have responded. This information is not intended to replace advice given to you by your health care provider. Make sure you discuss any questions you have with your health care provider. Document Revised: 11/13/2021 Document Reviewed: 11/13/2021 Elsevier Patient Education  2024 [employer]. I

## 2023-09-05 ENCOUNTER — Other Ambulatory Visit: Payer: Self-pay | Admitting: Nurse Practitioner

## 2023-09-05 ENCOUNTER — Telehealth: Payer: Self-pay

## 2023-09-05 DIAGNOSIS — G893 Neoplasm related pain (acute) (chronic): Secondary | ICD-10-CM

## 2023-09-05 DIAGNOSIS — Z515 Encounter for palliative care: Secondary | ICD-10-CM

## 2023-09-05 DIAGNOSIS — C61 Malignant neoplasm of prostate: Secondary | ICD-10-CM

## 2023-09-05 LAB — BPAM RBC: Blood Product Expiration Date: 202501272359

## 2023-09-05 MED ORDER — OXYCODONE HCL 5 MG PO TABS: 5.0000 mg | ORAL_TABLET | Freq: Four times a day (QID) | ORAL | 0 refills | Status: DC | PRN

## 2023-09-05 NOTE — Telephone Encounter (Signed)
 Pt called to refill oxycodone, see orders.

## 2023-09-06 LAB — TYPE AND SCREEN
ABO/RH(D): A NEG
Antibody Screen: NEGATIVE
Unit division: 0
Unit division: 0

## 2023-09-06 LAB — BPAM RBC
Blood Product Unit Number: 202501262359
ISSUE DATE / TIME: 202501040940
Unit Type and Rh: 202501272359
Unit Type and Rh: 202501262359
Unit Type and Rh: 202501272359
Unit Type and Rh: 600
Unit Type and Rh: 600

## 2023-09-06 NOTE — Progress Notes (Signed)
 Palliative Medicine [facility]  [phone] [phone]   Name: Wayne Perkins Date: 09/06/2023 MRN: [id number]  DOB: 1941-07-27  Patient Care Team: Wayne Comer BRAVO, MD as PCP - General (Family Medicine) Wayne Slain, MD as PCP - Cardiology (Cardiology) Wayne Redell DASEN, MD as Referring Physician (Urology) Wayne Buddle, RN Nurse Navigator as Registered Nurse (Medical Oncology) Wayne Callander, MD as Consulting Physician (Hematology and Oncology) Pickenpack-Cousar, Wayne SAILOR, NP as Nurse Practitioner (Hospice and Palliative Medicine)    INTERVAL HISTORY: Wayne Perkins is a 83 y.o. adult with  oncologic medical history including malignant neoplasm of prostate (07/2019) with metastatic disease to bone, as well as HLD, hypertension, CAD, CKD stage 3, type 2 diabetes, anemia, and a TIA in 2020.  Palliative ask to see for symptom management and goals of care.   SOCIAL HISTORY:     reports that he has never smoked. He has never used smokeless tobacco. He reports that he does not currently use alcohol . He reports that he does not use drugs.  ADVANCE DIRECTIVES:  None on file  CODE STATUS: Full code  PAST MEDICAL HISTORY: Past Medical History:  Diagnosis Date   Arthritis    Blood in stool    Childhood asthma    Diabetes mellitus without complication (HCC)    type II   Heart disease    Hypertension, essential    Prostate cancer (HCC)    Stroke (HCC)     ALLERGIES:  is allergic to tramadol , dexamethasone , glipizide , and statins.  MEDICATIONS:  Current Outpatient Medications  Medication Sig Dispense Refill   Alcohol  Swabs  (B-D SINGLE USE SWABS  REGULAR) PADS      blood glucose meter kit and supplies Use once a day in the AM for fasting sugars. 1 each 0   calcium -vitamin D  (OSCAL WITH D) 500-200 MG-UNIT tablet Take 1 tablet by mouth 2 (two) times daily. 60 tablet 3   Cholecalciferol  (VITAMIN D3) 50 MCG (2000 UT) TABS Take  2,000 Units by mouth daily.     clopidogrel  (PLAVIX ) 75 MG tablet TAKE 1 TABLET EVERY DAY 90 tablet 3   dronabinol  (MARINOL ) 2.5 MG capsule Take 1 capsule (2.5 mg total) by mouth 2 (two) times daily before lunch and supper. 60 capsule 0   Dulaglutide  (TRULICITY ) 0.75 MG/0.5ML SOPN Inject 0.75 mg into the skin every Wednesday. 4 pen 0   DULoxetine  (CYMBALTA ) 30 MG capsule Take 1 capsule (30 mg total) by mouth daily. 30 capsule 0   Ez Smart Blood Glucose Lancets MISC by Does not apply route. 100 each 3   glucose blood test strip Use as instructed 100 each 3   hydrOXYzine  (ATARAX ) 10 MG tablet TAKE 1 TABLET BY MOUTH 3 TIMES DAILY AS NEEDED FOR ITCHING. 270 tablet 1   metFORMIN  (GLUCOPHAGE ) 500 MG tablet Take 1 tablet (500 mg total) by mouth 2 (two) times daily with a meal. 180 tablet 1   Multiple Vitamin (MULTI-DAY VITAMINS PO) Take 1 tablet by mouth daily with breakfast.     NIFEdipine  (PROCARDIA  XL/NIFEDICAL-XL) 90 MG 24 hr tablet Take 1 tablet (90 mg total) by mouth daily. 90 tablet 3   nitroGLYCERIN  (NITROSTAT ) 0.4 MG SL tablet Place 1 tablet (0.4 mg total) under the tongue every 5 (five) minutes as needed for chest pain. 25 tablet 3   oxyCODONE  (OXY IR/ROXICODONE ) 5 MG immediate release tablet Take 1 tablet (5 mg total) by mouth every 6 (six) hours as needed for severe pain (  pain score 7-10). 90 tablet 0   polyethylene glycol (MIRALAX  / GLYCOLAX ) 17 g packet Take 8.5 g by mouth in the morning and at bedtime.     potassium chloride  SA (KLOR-CON  M) 20 MEQ tablet Take 1 tablet (20 mEq total) by mouth 2 (two) times daily. 180 tablet 3   rosuvastatin  (CRESTOR ) 5 MG tablet TAKE 1 TABLET EVERY DAY 90 tablet 3   TYLENOL  325 MG CAPS Take 325-650 mg by mouth every 6 (six) hours as needed (for pain).     vitamin B-12 (CYANOCOBALAMIN ) 1000 MCG tablet Take 1,000 mcg by mouth daily.     No current facility-administered medications for this visit.    VITAL SIGNS: There were no vitals taken for this  visit. There were no vitals filed for this visit.  Estimated body mass index is 19.16 kg/m as calculated from the following:   Height as of 07/18/23: 5' 11 (1.803 m).   Weight as of 08/18/23: 137 lb 6.4 oz (62.3 kg).     Latest Ref Rng & Units 09/02/2023    3:06 PM 08/26/2023   11:23 AM 08/18/2023    8:39 AM  CMP  Glucose 70 - 99 mg/dL 839  772  786   BUN 8 - 23 mg/dL 33  40  30   Creatinine 0.61 - 1.24 mg/dL 8.70  8.70  8.83   Sodium 135 - 145 mmol/L 138  138  137   Potassium 3.5 - 5.1 mmol/L 3.9  3.5  3.5   Chloride 98 - 111 mmol/L 102  102  102   CO2 22 - 32 mmol/L 24  24  23    Calcium  8.9 - 10.3 mg/dL 8.7  8.6  8.5   Total Protein 6.5 - 8.1 g/dL 6.5  6.6  6.5   Total Bilirubin 0.0 - 1.2 mg/dL 0.5  0.5  0.6   Alkaline Phos 38 - 126 U/L 251  207  217   AST 15 - 41 U/L 25  21  23    ALT 0 - 44 U/L 5  6  6      PERFORMANCE STATUS (ECOG) : 1 - Symptomatic but completely ambulatory   Physical Exam General: NAD, in wheelchair Cardiovascular: regular rate and rhythm Pulmonary: normal breathing pattern  Extremities: no edema, no joint deformities Skin: no rashes Neurological: AAO x4  Discussed the use of AI scribe software for clinical note transcription with the patient, who gave verbal consent to proceed.   IMPRESSION:  [name] presents to clinic for follow-up. No acute distress noted. His wife and son are present. Patient in wheelchair. States he is taking things one day at a time. Denies nausea, vomiting, or diarrhea. Ongoing fatigue and weakness. The patient's bowel movements have reportedly improved, with no significant constipation or diarrhea.   The patient has a history of advanced cancer. He is scheduled for a blood transfusion today. He reports that the previous transfusion, performed a week ago, initially improved his condition but the effect was short-lived. Over the past week, the patient's energy levels have been relatively stable until the latter part of the week  when he began to decline.   He reports aching in various parts of the body, particularly the elbow, back, and legs. The discomfort is described as different from the initial pain experienced when his nerve was pinched. Pain tends to be more consistent and varies in severity.  He is currently taking oxycodone  5mg  three times a day for pain management. This regimen  provides some relief. Wife shares patient is having more pain than he leads on to have and tries to bear through it. Education provided on use of extended-release medication in support of breakthrough  medication.  Mr. Makara states he prefers not to add more pills to his regimen. I discussed possible use of Fentanyl  patch versus oral medication to offer support. Education provided on use, efficacy, and administration. Patient and family verbalized understanding. He would like to some time to consider as he continue oxycodone  as needed. Requesting to discuss at follow-up.   I empathetically approached discussions regarding healthcare limitations and his current illness and overall condition. Patient and family began initial discussions today with Dr. Lanny. I created space and opportunity for patient and family to express their thoughts and feelings. Mrs. Parke is emotional expressing the need for complex decisions. She and her son desire for patient not to suffer with a focus on his comfort and what quality of life he has for as long as he has. Emotional support provided. Patient and family acknowledges that blood transfusions and therapies are not sustaining or affording him the improvement as they have in the past. Symptoms only are resolving briefly 24 hours and return. We discussed the ongoing decline and symptoms with or without treatments.   Education provided on hospice's goals and philosophy of care. We discussed what care would look like in the home allowing for the support he needs. Mr. Herms becomes emotional sharing he knows he will pass away  in God's timing but wishes to enjoy every moment that he cans with his family. Patient and wife expresses some time to further discuss wishes prior to making complex decisions.   We discussed current code status with consideration of current illness. The patient has expressed a desire to avoid life support measures to prolong life, and has completed a Do Not Resuscitate order. I introduced MOST form and education provided.   Mr. Groft has been receiving blood transfusions every 1-2week. He and family are clear current goals are to continue this schedule allowing time for family discussions. He is focused on maintaining comfort and quality of life in his current stage of illness.  I discussed the importance of continued conversation with family and their medical providers regarding overall plan of care and treatment options, ensuring decisions are within the context of the patients values and GOCs.  PLAN:  Advanced Cancer/Cancer Related Pain Receiving regular blood transfusions, with limited benefit noted. Patient experiences generalized aches and localized discomfort. Currently taking Oxycodone  3 times daily for pain management. -Continue with planned blood transfusions every 10-14 days as per Dr. Lanny.  -Consider long-acting pain management options if Oxycodone  becomes less effective. -Administer Oxycodone  during transfusion sessions if needed. -Continue Oxycodone  5mg  1-2 tablets every 4 hours as needed.   End of Life Care/Goals of Care Patient and family have discussed Do Not Resuscitate (DNR) orders and are considering hospice care. Patient does not wish for life-prolonging measures in the event of a natural death. He and family are realistic in their understanding. -Provide DNR form for patient and family to review and sign when ready. -Discuss hospice care options further, including frequency of visits and services provided. -MOST form introduced and education provided.  -Continue to  provide support and care coordination in collaboration with hospice team when enrolled.  Follow-up in 1-2 of weeks or sooner if patient's condition changes.  Patient expressed understanding and was in agreement with this plan. He also understands that He can call  the clinic at any time with any questions, concerns, or complaints.   Any controlled substances utilized were prescribed in the context of palliative care. PDMP has been reviewed.   Visit consisted of counseling and education dealing with the complex and emotionally intense issues of symptom management and palliative care in the setting of serious and potentially life-threatening illness.  Levon Borer, AGPCNP-BC  Palliative Medicine Team/[facility]

## 2023-09-07 ENCOUNTER — Encounter: Payer: Self-pay | Admitting: *Deleted

## 2023-09-07 NOTE — Assessment & Plan Note (Signed)
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  He received radiation treatment in 2015. -He started androgen deprivation therapy for biochemical recurrent disease subsequently, with PSA decreased down to 0.4 in 2018.  He received a intermittent ADT due to side effects. -He developed bone and lymph node metastasis in 2021, started Xtandi 160 mg daily in June 2021.  Therapy stopped after 3 months due to poor tolerance. -He started Zytiga 500 mg daily with prednisone 5 mg daily in May 2022.  Therapy discontinued in June 2022 due to poor tolerance.  He is only on Eligard every 3 months now. -He started [place] on September 29, 2022, he tolerated treatments very well but anemia got worse.  He completed on 05/04/2023 -Unfortunately his PSA has gone up from 440 in 09/2022 to 1870 on 05/04/2023 and his pain has gotten worse  -We reviewed other treatment options for metastatic prostate cancer, patient is not interested in chemotherapy, but is open to other biological agent.  Will check his genetics and Guardant 360, to see if he is a candidate for PARP inhibitor down the road.

## 2023-09-07 NOTE — Progress Notes (Signed)
 Guardant 360 ordered and completed form brought to lab for his appt on 09/08/23

## 2023-09-08 ENCOUNTER — Inpatient Hospital Stay: Payer: Medicare HMO

## 2023-09-08 ENCOUNTER — Other Ambulatory Visit: Payer: Self-pay | Admitting: *Deleted

## 2023-09-08 ENCOUNTER — Inpatient Hospital Stay (HOSPITAL_BASED_OUTPATIENT_CLINIC_OR_DEPARTMENT_OTHER): Payer: Medicare HMO | Admitting: Nurse Practitioner

## 2023-09-08 ENCOUNTER — Encounter: Payer: Self-pay | Admitting: Hematology

## 2023-09-08 ENCOUNTER — Inpatient Hospital Stay (HOSPITAL_BASED_OUTPATIENT_CLINIC_OR_DEPARTMENT_OTHER): Payer: Medicare HMO | Admitting: Hematology

## 2023-09-08 VITALS — BP 131/81 | HR 82 | Temp 98.2°F | Resp 16

## 2023-09-08 VITALS — BP 117/62 | HR 89 | Temp 98.6°F | Resp 16 | Wt 135.9 lb

## 2023-09-08 DIAGNOSIS — C61 Malignant neoplasm of prostate: Secondary | ICD-10-CM

## 2023-09-08 DIAGNOSIS — G893 Neoplasm related pain (acute) (chronic): Secondary | ICD-10-CM | POA: Diagnosis not present

## 2023-09-08 DIAGNOSIS — Z7189 Other specified counseling: Secondary | ICD-10-CM

## 2023-09-08 DIAGNOSIS — Z79891 Long term (current) use of opiate analgesic: Secondary | ICD-10-CM | POA: Diagnosis not present

## 2023-09-08 DIAGNOSIS — R5383 Other fatigue: Secondary | ICD-10-CM | POA: Diagnosis not present

## 2023-09-08 DIAGNOSIS — Z515 Encounter for palliative care: Secondary | ICD-10-CM

## 2023-09-08 DIAGNOSIS — C7951 Secondary malignant neoplasm of bone: Secondary | ICD-10-CM

## 2023-09-08 DIAGNOSIS — R530 Neoplastic (malignant) related fatigue: Secondary | ICD-10-CM | POA: Diagnosis not present

## 2023-09-08 DIAGNOSIS — Z923 Personal history of irradiation: Secondary | ICD-10-CM | POA: Diagnosis not present

## 2023-09-08 DIAGNOSIS — D649 Anemia, unspecified: Secondary | ICD-10-CM

## 2023-09-08 DIAGNOSIS — D630 Anemia in neoplastic disease: Secondary | ICD-10-CM | POA: Diagnosis not present

## 2023-09-08 DIAGNOSIS — R630 Anorexia: Secondary | ICD-10-CM

## 2023-09-08 DIAGNOSIS — Z79899 Other long term (current) drug therapy: Secondary | ICD-10-CM | POA: Diagnosis not present

## 2023-09-08 LAB — CBC WITH DIFFERENTIAL (CANCER CENTER ONLY)
Abs Immature Granulocytes: 0.19 10*3/uL — ABNORMAL HIGH (ref 0.00–0.07)
Basophils Absolute: 0 10*3/uL (ref 0.0–0.1)
Basophils Relative: 1 %
Eosinophils Absolute: 0 10*3/uL (ref 0.0–0.5)
Eosinophils Relative: 1 %
HCT: 23.6 % — ABNORMAL LOW (ref 39.0–52.0)
Hemoglobin: 7.9 g/dL — ABNORMAL LOW (ref 13.0–17.0)
Immature Granulocytes: 5 %
Lymphocytes Relative: 19 %
Lymphs Abs: 0.8 10*3/uL (ref 0.7–4.0)
MCH: 30.3 pg (ref 26.0–34.0)
MCHC: 33.5 g/dL (ref 30.0–36.0)
MCV: 90.4 fL (ref 80.0–100.0)
Monocytes Absolute: 0.4 10*3/uL (ref 0.1–1.0)
Monocytes Relative: 9 %
Neutro Abs: 2.6 10*3/uL (ref 1.7–7.7)
Neutrophils Relative %: 65 %
Platelet Count: 69 10*3/uL — ABNORMAL LOW (ref 150–400)
RBC: 2.61 MIL/uL — ABNORMAL LOW (ref 4.22–5.81)
RDW: 20.2 % — ABNORMAL HIGH (ref 11.5–15.5)
WBC Count: 3.9 10*3/uL — ABNORMAL LOW (ref 4.0–10.5)
nRBC: 0.5 % — ABNORMAL HIGH (ref 0.0–0.2)

## 2023-09-08 LAB — CMP (CANCER CENTER ONLY)
ALT: 7 U/L (ref 0–44)
AST: 26 U/L (ref 15–41)
Albumin: 3.4 g/dL — ABNORMAL LOW (ref 3.5–5.0)
Alkaline Phosphatase: 236 U/L — ABNORMAL HIGH (ref 38–126)
Anion gap: 12 (ref 5–15)
BUN: 30 mg/dL — ABNORMAL HIGH (ref 8–23)
CO2: 24 mmol/L (ref 22–32)
Calcium: 8.8 mg/dL — ABNORMAL LOW (ref 8.9–10.3)
Chloride: 103 mmol/L (ref 98–111)
Creatinine: 1.15 mg/dL (ref 0.61–1.24)
GFR, Estimated: >60 mL/min (ref >60)
Glucose, Bld: 180 mg/dL — ABNORMAL HIGH (ref 70–99)
Potassium: 3.8 mmol/L (ref 3.5–5.1)
Sodium: 139 mmol/L (ref 135–145)
Total Bilirubin: 0.5 mg/dL (ref 0.0–1.2)
Total Protein: 6.6 g/dL (ref 6.5–8.1)

## 2023-09-08 LAB — PREPARE RBC (CROSSMATCH)

## 2023-09-08 LAB — SAMPLE TO BLOOD BANK

## 2023-09-08 MED ORDER — OXYCODONE HCL 5 MG PO TABS: 5.0000 mg | ORAL_TABLET | Freq: Once | ORAL | Status: DC

## 2023-09-08 MED ORDER — SODIUM CHLORIDE 0.9% IV SOLUTION
250.0000 mL | INTRAVENOUS | Status: DC
  Administered 2023-09-08: 100 mL via INTRAVENOUS

## 2023-09-08 NOTE — Patient Instructions (Signed)

## 2023-09-08 NOTE — Progress Notes (Signed)
 [facility]   [phone] [phone]) [phone]   Clinic Follow up Note   Patient Care Team: Wayne Perkins BRAVO, MD as PCP - General (Family Medicine) Wayne Slain, MD as PCP - Cardiology (Cardiology) Wayne Perkins DASEN, MD as Referring Physician (Urology) Wayne Buddle, RN Nurse Navigator as Registered Nurse (Medical Oncology) Wayne Callander, MD as Consulting Physician (Hematology and Oncology) Wayne [facility], Wayne SAILOR, NP as Nurse Practitioner [name])  Date of Service:  09/08/2023  CHIEF COMPLAINT: f/u of metastatic prostate cancer  CURRENT THERAPY:  Supportive care  Blood transfusion as needed for hemoglobin less than 8.0  Oncology History   Malignant neoplasm of prostate metastatic to bone [facility]) -Stage IV with note and bone metastasis, diagnosed in 2021, castration resistant -Initially presented with localized disease and Gleason score 6 in 2015.  He received radiation treatment in 2015. -He started androgen deprivation therapy for biochemical recurrent disease subsequently, with PSA decreased down to 0.4 in 2018.  He received a intermittent ADT due to side effects. -He developed bone and lymph node metastasis in 2021, started Xtandi  160 mg daily in June 2021.  Therapy stopped after 3 months due to poor tolerance. -He started Zytiga  500 mg daily with prednisone  5 mg daily in May 2022.  Therapy discontinued in June 2022 due to poor tolerance.  He is only on Eligard  every 3 months now. -He started Xofigo  on September 29, 2022, he tolerated treatments very well but anemia got worse.  He completed on 05/04/2023 -Unfortunately his PSA has gone up from 440 in 09/2022 to 1870 on 05/04/2023 and his pain has gotten worse  -We reviewed other treatment options for metastatic prostate cancer, patient is not interested in chemotherapy, but is open to other biological agent.  Will check his genetics and Guardant 360, to see if he is a candidate  for PARP inhibitor down the road.    Assessment and Plan    Metastatic Prostate Cancer Stage IV prostate cancer with metastasis to bone and lymph nodes. PSA level recently 712. Significant fatigue, cold intolerance, and decreased appetite. Limited benefit from radiation therapy. Discussed limited benefit of continued blood transfusions and hospice care. Patient prefers supportive care with regular blood transfusions. Blood transfusions are temporary measures to alleviate fatigue. Discussed Guardian 360 test for potential treatment options based on tumor mutations. - Administer two units of blood transfusion today - Schedule blood transfusions every two weeks - Order Guardian 360 test - Refer to palliative care nurse practitioner for pain management and goals of care discussion  Anemia secondary to Metastatic Prostate Cancer Chronic anemia with hemoglobin around 7.9 g/dL due to bone marrow infiltration. Patient reports exhaustion and minimal improvement from recent transfusions. Anemia is secondary to cancer in the bone marrow; transfusions are temporary measures to improve energy levels. - Administer two units of blood transfusion today - Schedule blood transfusions every two weeks - Monitor hemoglobin levels regularly  Gastrointestinal Symptoms Reports stomach upset, decreased appetite, and difficulty swallowing pills, particularly potassium. Discontinued metformin  due to gastrointestinal side effects. Discussed aloe juice for stomach comfort and liquid potassium if pill form is intolerable. - Consider liquid potassium if pill form is intolerable - Encourage use of aloe juice for stomach comfort - Monitor nutritional intake and consider dietary adjustments  Goals of Care Discussed progression of metastatic prostate cancer and limited benefit of continued blood transfusions. Patient and family prefer supportive care with regular blood transfusions over hospice care. He values quality time  at home  and avoiding [facility] admissions. Patient understands blood transfusions are temporary and cancer is not curable but prefers to continue transfusions as long as beneficial. - Refer to palliative care nurse practitioner for further discussion on goals of care and symptom management  Follow-up - Schedule follow-up appointment in two months - Contact healthcare provider if condition changes or if transitioning to hospice care is considered.  -will schedule lab and blood transfusion 2u the day after if Hg<8.0        SUMMARY OF ONCOLOGIC HISTORY: Oncology History Overview Note   Cancer Staging  Malignant neoplasm of prostate metastatic to bone [facility]) Staging form: Prostate, AJCC 8th Edition - Clinical: Stage IVB (cTX, cNX, pM1b, Grade Group: 3) - Signed by Amadeo Windell SAILOR, MD on 02/17/2021 Gleason score: 7 Histologic grading system: 5 grade system     Malignant neoplasm of prostate metastatic to bone (HCC)  07/31/2019 Initial Diagnosis   Malignant neoplasm of prostate metastatic to bone (HCC)   02/17/2021 Cancer Staging   Staging form: Prostate, AJCC 8th Edition - Clinical: Stage IVB (cTX, cNX, pM1b, Grade Group: 3) - Signed by Amadeo Windell SAILOR, MD on 02/17/2021 Gleason score: 7 Histologic grading system: 5 grade system   Prostate cancer (HCC)  02/19/2020 Initial Diagnosis   Prostate cancer [facility])      Discussed the use of AI scribe software for clinical note transcription with the patient, who gave verbal consent to proceed.  History of Present Illness   The patient is an 83 year old male with a known diagnosis of metastatic prostate cancer. He presents today for a follow-up visit. The patient reports feeling zapped and exhausted, especially in the last few days. He had a blood transfusion last week, which initially helped but the benefits seem to have worn off. He also reports some muscular pain in his elbow and other places. He has difficulty swallowing pills, particularly  potassium, and has stopped taking metformin  due to stomach upset. His appetite is variable, with some foods not tasting good. He spends most of his time on the couch, either sitting or lying down, and has difficulty maintaining body temperature, often feeling cold despite a warm room temperature. He is able to walk to the table to eat and go to the bathroom by himself, but these activities exhaust him. He has trouble sleeping at night, often waking up.         All other systems were reviewed with the patient and are negative.  MEDICAL HISTORY:  Past Medical History:  Diagnosis Date   Arthritis    Blood in stool    Childhood asthma    Diabetes mellitus without complication (HCC)    type II   Heart disease    Hypertension, essential    Prostate cancer (HCC)    Stroke [facility])     SURGICAL HISTORY: Past Surgical History:  Procedure Laterality Date   CORONARY ANGIOPLASTY WITH STENT PLACEMENT     GANGLION CYST EXCISION Right    PROSTATE BIOPSY      I have reviewed the social history and family history with the patient and they are unchanged from previous note.  ALLERGIES:  is allergic to tramadol , dexamethasone , glipizide , and statins.  MEDICATIONS:  Current Outpatient Medications  Medication Sig Dispense Refill   Alcohol  Swabs  (B-D SINGLE USE SWABS  REGULAR) PADS      blood glucose meter kit and supplies Use once a day in the AM for fasting sugars. 1 each 0   calcium -vitamin D  (OSCAL WITH D) 500-200  MG-UNIT tablet Take 1 tablet by mouth 2 (two) times daily. 60 tablet 3   Cholecalciferol  (VITAMIN D3) 50 MCG (2000 UT) TABS Take 2,000 Units by mouth daily.     clopidogrel  (PLAVIX ) 75 MG tablet TAKE 1 TABLET EVERY DAY 90 tablet 3   dronabinol  (MARINOL ) 2.5 MG capsule Take 1 capsule (2.5 mg total) by mouth 2 (two) times daily before lunch and supper. 60 capsule 0   Dulaglutide  (TRULICITY ) 0.75 MG/0.5ML SOPN Inject 0.75 mg into the skin every Wednesday. 4 pen 0   DULoxetine  (CYMBALTA ) 30  MG capsule Take 1 capsule (30 mg total) by mouth daily. 30 capsule 0   Ez Smart Blood Glucose Lancets MISC by Does not apply route. 100 each 3   glucose blood test strip Use as instructed 100 each 3   hydrOXYzine  (ATARAX ) 10 MG tablet TAKE 1 TABLET BY MOUTH 3 TIMES DAILY AS NEEDED FOR ITCHING. 270 tablet 1   metFORMIN  (GLUCOPHAGE ) 500 MG tablet Take 1 tablet (500 mg total) by mouth 2 (two) times daily with a meal. 180 tablet 1   Multiple Vitamin (MULTI-DAY VITAMINS PO) Take 1 tablet by mouth daily with breakfast.     NIFEdipine  (PROCARDIA  XL/NIFEDICAL-XL) 90 MG 24 hr tablet Take 1 tablet (90 mg total) by mouth daily. 90 tablet 3   nitroGLYCERIN  (NITROSTAT ) 0.4 MG SL tablet Place 1 tablet (0.4 mg total) under the tongue every 5 (five) minutes as needed for chest pain. 25 tablet 3   oxyCODONE  (OXY IR/ROXICODONE ) 5 MG immediate release tablet Take 1 tablet (5 mg total) by mouth every 6 (six) hours as needed for severe pain (pain score 7-10). 90 tablet 0   polyethylene glycol (MIRALAX  / GLYCOLAX ) 17 g packet Take 8.5 g by mouth in the morning and at bedtime.     potassium chloride  SA (KLOR-CON  M) 20 MEQ tablet Take 1 tablet (20 mEq total) by mouth 2 (two) times daily. 180 tablet 3   rosuvastatin  (CRESTOR ) 5 MG tablet TAKE 1 TABLET EVERY DAY 90 tablet 3   TYLENOL  325 MG CAPS Take 325-650 mg by mouth every 6 (six) hours as needed (for pain).     vitamin B-12 (CYANOCOBALAMIN ) 1000 MCG tablet Take 1,000 mcg by mouth daily.     No current facility-administered medications for this visit.   Facility-Administered Medications Ordered in Other Visits  Medication Dose Route Frequency Provider Last Rate Last Admin   0.9 %  sodium chloride  infusion (Manually program via Guardrails IV Fluids)  250 mL Intravenous Continuous Wayne Callander, MD        PHYSICAL EXAMINATION: ECOG PERFORMANCE STATUS: 3 - Symptomatic, >50% confined to bed  Vitals:   09/08/23 0904  BP: 117/62  Pulse: 89  Resp: 16  Temp: 98.6 F (37  C)  SpO2: 100%   Wt Readings from Last 3 Encounters:  09/08/23 135 lb 14.4 oz (61.6 kg)  08/18/23 137 lb 6.4 oz (62.3 kg)  08/03/23 144 lb 7 oz (65.5 kg)     GENERAL:alert, no distress and comfortable, chronically ill appearing  SKIN: skin color, texture, turgor are normal, no rashes or significant lesions EYES: normal, Conjunctiva are pink and non-injected, sclera clear Musculoskeletal:no cyanosis of digits and no clubbing  NEURO: alert & oriented x 3 with fluent speech, no focal motor/sensory deficits  LABORATORY DATA:  I have reviewed the data as listed    Latest Ref Rng & Units 09/08/2023    8:41 AM 09/02/2023    3:06 PM 08/26/2023  11:23 AM  CBC  WBC 4.0 - 10.5 K/uL 3.9  4.9  4.5   Hemoglobin 13.0 - 17.0 g/dL 7.9  7.7  9.0   Hematocrit 39.0 - 52.0 % 23.6  23.5  27.6   Platelets 150 - 400 K/uL 69  83  84         Latest Ref Rng & Units 09/08/2023    8:41 AM 09/02/2023    3:06 PM 08/26/2023   11:23 AM  CMP  Glucose 70 - 99 mg/dL 819  839  772   BUN 8 - 23 mg/dL 30  33  40   Creatinine 0.61 - 1.24 mg/dL 8.84  8.70  8.70   Sodium 135 - 145 mmol/L 139  138  138   Potassium 3.5 - 5.1 mmol/L 3.8  3.9  3.5   Chloride 98 - 111 mmol/L 103  102  102   CO2 22 - 32 mmol/L 24  24  24    Calcium  8.9 - 10.3 mg/dL 8.8  8.7  8.6   Total Protein 6.5 - 8.1 g/dL 6.6  6.5  6.6   Total Bilirubin 0.0 - 1.2 mg/dL 0.5  0.5  0.5   Alkaline Phos 38 - 126 U/L 236  251  207   AST 15 - 41 U/L 26  25  21    ALT 0 - 44 U/L 7  5  6        RADIOGRAPHIC STUDIES: I have personally reviewed the radiological images as listed and agreed with the findings in the report. No results found.    Orders Placed This Encounter  Procedures   CMP (Cancer Center only)    Standing Status:   Standing    Number of Occurrences:   50    Expiration Date:   09/08/2024   Guardant 360    Standing Status:   Future    Number of Occurrences:   1    Expiration Date:   09/07/2024   All questions were answered. The  patient knows to call the clinic with any problems, questions or concerns. No barriers to learning was detected. The total time spent in the appointment was 30 minutes.     Onita Mattock, MD 09/08/2023

## 2023-09-09 LAB — TYPE AND SCREEN
ABO/RH(D): A NEG
Antibody Screen: NEGATIVE
Unit division: 0
Unit division: 0

## 2023-09-09 LAB — BPAM RBC
Blood Product Expiration Date: 202501272359
Blood Product Expiration Date: 202501292359
ISSUE DATE / TIME: 202501091049
ISSUE DATE / TIME: 202501091049
Unit Type and Rh: 600
Unit Type and Rh: 600

## 2023-09-10 ENCOUNTER — Encounter: Payer: Self-pay | Admitting: Hematology

## 2023-09-10 ENCOUNTER — Encounter: Payer: Self-pay | Admitting: Nurse Practitioner

## 2023-09-10 LAB — PROSTATE-SPECIFIC AG, SERUM (LABCORP): Prostate Specific Ag, Serum: 1018 ng/mL — ABNORMAL HIGH (ref 0.0–4.0)

## 2023-09-12 ENCOUNTER — Telehealth: Payer: Self-pay | Admitting: Hematology

## 2023-09-12 NOTE — Telephone Encounter (Signed)
 Left patient and patient spouse a voicemail, was able to contact to patient's son and they said they would relay message to patient regarding scheduled appointments

## 2023-09-13 ENCOUNTER — Ambulatory Visit (HOSPITAL_BASED_OUTPATIENT_CLINIC_OR_DEPARTMENT_OTHER): Payer: Medicare HMO | Admitting: Family

## 2023-09-13 ENCOUNTER — Other Ambulatory Visit: Payer: Self-pay

## 2023-09-13 ENCOUNTER — Encounter (HOSPITAL_BASED_OUTPATIENT_CLINIC_OR_DEPARTMENT_OTHER): Payer: Self-pay | Admitting: Family

## 2023-09-13 VITALS — BP 136/70 | HR 78 | Ht 71.0 in | Wt 124.6 lb

## 2023-09-13 DIAGNOSIS — I25118 Atherosclerotic heart disease of native coronary artery with other forms of angina pectoris: Secondary | ICD-10-CM

## 2023-09-13 DIAGNOSIS — E785 Hyperlipidemia, unspecified: Secondary | ICD-10-CM | POA: Diagnosis not present

## 2023-09-13 DIAGNOSIS — I10 Essential (primary) hypertension: Secondary | ICD-10-CM

## 2023-09-13 DIAGNOSIS — Z8673 Personal history of transient ischemic attack (TIA), and cerebral infarction without residual deficits: Secondary | ICD-10-CM | POA: Diagnosis not present

## 2023-09-13 MED ORDER — FENTANYL 12 MCG/HR TD PT72: 1.0000 | MEDICATED_PATCH | TRANSDERMAL | 0 refills | Status: DC

## 2023-09-13 NOTE — Patient Instructions (Signed)

## 2023-09-13 NOTE — Progress Notes (Signed)
 Cardiology Office Note:  .   Date:  09/13/2023  ID:  Wayne Perkins, DOB [birth year], MRN [id number] PCP: Mahlon Comer BRAVO, MD  [facility] Providers Cardiologist:  Shelda Bruckner, MD    History of Present Illness: .   Wayne Perkins is a 83 y.o. adult with a hx of TIA/CVA, CAd s/p prior stents, left chest wall lipoma, HTN, DM2 with nephropathy/CKD, HLD, malignant prostate cancer.   Prior PCI in 2004 to the left side per his report by Dr. Zena at [facility]. Anginal equivalent of bilateral hand numbness, loss of consciousness. In 2010 had PCI=RCA by Dr. Juleen at [facility] in [place].   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 While loading up the truck felt a heavy pressure. Imdue 30mg  at bedtime initiated. At visit 11/2022 myoview ordered due to chest pain but not completed. Admitted 05/2023 with steroid induced hyperglyemia, discharged on Farxiga . At visit 06/13/23 only rare twinges of chest pain which were atypical for angina. Due to hypotension, hydrochlorothiazide  and Imdur  stopped.    He presents today for follow up with his wife. Struggling with pain related to his bone metastases. Notes fatigue, tiredness ongoing since last visit. Left chest soreness at site of lipoma but not pain. Has been overall sedentary.   ROS: Please see the history of present illness.    All other systems reviewed and are negative.   Studies Reviewed: .        Cardiac Studies & Procedures      ECHOCARDIOGRAM  ECHOCARDIOGRAM COMPLETE 08/01/2019  Narrative ECHOCARDIOGRAM REPORT    Patient Name:   Wayne Perkins Date of Exam: 08/01/2019 Medical Rec #:  [id number]   Height:       70.0 in Accession #:    [id number]  Weight:       167.8 lb Date of Birth:  1941-05-15   BSA:          1.94 m Patient Age:    78 years    BP:           146/89 mmHg Patient Gender: M           HR:           79 bpm. Exam  Location:  [facility]  Procedure: 2D Echo, Cardiac Doppler and Color Doppler  Indications:    Stroke 434.91 / I163.9  History:        Patient has no prior history of Echocardiogram examinations. Risk Factors:Dyslipidemia, Hypertension and Diabetes. Prostate cancer (HCC) (From Hx).  Sonographer:    Aida Pizza RCS Referring Phys: [phone] CARLOS MADERA  IMPRESSIONS   1. Left ventricular ejection fraction, by visual estimation, is 60 to 65%. The left ventricle has normal function. There is mildly increased left ventricular hypertrophy. 2. Left ventricular diastolic parameters are consistent with Grade I diastolic dysfunction (impaired relaxation). 3. Global right ventricle has normal systolic function.The right ventricular size is normal. No increase in right ventricular wall thickness. 4. Left atrial size was normal. 5. Right atrial size was normal. 6. Presence of pericardial fat pad. 7. Mild aortic valve annular calcification. 8. The mitral valve is grossly normal. Mild mitral valve regurgitation. 9. The tricuspid valve is grossly normal. Tricuspid valve regurgitation is mild. 10. The aortic valve is tricuspid. Aortic valve regurgitation is not visualized. 11. The pulmonic valve was grossly normal. Pulmonic valve regurgitation is trivial. 12. The ascending aorta  was not well visualized. 13. TR signal is inadequate for assessing pulmonary artery systolic pressure. 14. The inferior vena cava is normal in size with greater than 50% respiratory variability, suggesting right atrial pressure of 3 mmHg.  FINDINGS Left Ventricle: Left ventricular ejection fraction, by visual estimation, is 60 to 65%. The left ventricle has normal function. The left ventricular internal cavity size was the left ventricle is normal in size. There is mildly increased left ventricular hypertrophy. Left ventricular diastolic parameters are consistent with Grade I diastolic dysfunction (impaired  relaxation).  Right Ventricle: The right ventricular size is normal. No increase in right ventricular wall thickness. Global RV systolic function is has normal systolic function.  Left Atrium: Left atrial size was normal in size.  Right Atrium: Right atrial size was normal in size  Pericardium: There is no evidence of pericardial effusion. Presence of pericardial fat pad.  Mitral Valve: The mitral valve is grossly normal. Mild mitral valve regurgitation.  Tricuspid Valve: The tricuspid valve is grossly normal. Tricuspid valve regurgitation is mild.  Aortic Valve: The aortic valve is tricuspid. Aortic valve regurgitation is not visualized. Mild aortic valve annular calcification.  Pulmonic Valve: The pulmonic valve was grossly normal. Pulmonic valve regurgitation is trivial.  Aorta: The ascending aorta was not well visualized.  Venous: The inferior vena cava is normal in size with greater than 50% respiratory variability, suggesting right atrial pressure of 3 mmHg.  IAS/Shunts: No atrial level shunt detected by color flow Doppler.   LEFT VENTRICLE PLAX 2D LVIDd:         4.16 cm       Diastology LVIDs:         2.29 cm       LV e' lateral:   7.72 cm/s LV PW:         1.31 cm       LV E/e' lateral: 4.3 LV IVS:        1.34 cm       LV e' medial:    4.79 cm/s LVOT diam:     2.00 cm       LV E/e' medial:  7.0 LV SV:         59 ml LV SV Index:   30.30 LVOT Area:     3.14 cm  LV Volumes (MOD) LV area d, A2C:    22.40 cm LV area d, A4C:    22.10 cm LV area s, A2C:    11.70 cm LV area s, A4C:    10.80 cm LV major d, A2C:   6.94 cm LV major d, A4C:   6.74 cm LV major s, A2C:   5.37 cm LV major s, A4C:   4.73 cm LV vol d, MOD A2C: 61.0 ml LV vol d, MOD A4C: 59.4 ml LV vol s, MOD A2C: 22.0 ml LV vol s, MOD A4C: 20.2 ml LV SV MOD A2C:     39.0 ml LV SV MOD A4C:     59.4 ml LV SV MOD BP:      38.4 ml  RIGHT VENTRICLE RV S prime:     18.60 cm/s TAPSE (M-mode): 2.7 cm  LEFT  ATRIUM             Index       RIGHT ATRIUM           Index LA diam:        3.10 cm 1.60 cm/m  RA Area:     13.50 cm  LA Vol (A2C):   41.6 ml 21.48 ml/m RA Volume:   27.60 ml  14.25 ml/m LA Vol (A4C):   35.1 ml 18.12 ml/m LA Biplane Vol: 40.6 ml 20.96 ml/m AORTIC VALVE LVOT Vmax:   97.63 cm/s LVOT Vmean:  64.400 cm/s LVOT VTI:    0.158 m  AORTA Ao Root diam: 3.80 cm  MITRAL VALVE MV Area (PHT): 7.55 cm             SHUNTS MV PHT:        29.14 msec           Systemic VTI:  0.16 m MV Decel Time: 101 msec             Systemic Diam: 2.00 cm MV E velocity: 33.40 cm/s 103 cm/s MV A velocity: 83.60 cm/s 70.3 cm/s MV E/A ratio:  0.40       1.5   Jayson Sierras MD Electronically signed by Jayson Sierras MD Signature Date/Time: 08/01/2019/11:01:48 AM    Final             Risk Assessment/Calculations:             Physical Exam:   VS:  BP 136/70 (BP Location: Right Arm, Patient Position: Sitting, Cuff Size: Normal)   Pulse 78   Ht 5' 11 (1.803 m)   Wt 124 lb 9.6 oz (56.5 kg)   SpO2 97%   BMI 17.38 kg/m    Wt Readings from Last 3 Encounters:  09/13/23 124 lb 9.6 oz (56.5 kg)  09/08/23 135 lb 14.4 oz (61.6 kg)  08/18/23 137 lb 6.4 oz (62.3 kg)    GEN: Thin,  well developed in no acute distress NECK: No JVD; No carotid bruits CARDIAC: RRR, no murmurs, rubs, gallops RESPIRATORY:  Clear to auscultation without rales, wheezing or rhonchi  ABDOMEN: Soft, non-tender, non-distended EXTREMITIES:  No edema; No deformity   ASSESSMENT AND PLAN: .    CAD - Prior PCI 2004 and 2010 in [place] . Stable with no anginal symptoms. No indication for ischemic evaluation.  GDMT Imdur , Plavix , Rosuvsatatin, PRN nitroglycerin .    HLD, LDL goal <70 - Continue rosuvastatin  5 mg daily.   HTN - No recurrent symptomatic hypotension since being off hydrochlorothiazide  and Imdur . Continue Nifedipine . We discussed coulc reduce dose if he had recurrent hypotension.    Prostate cancer with  metastasis to bone - Persistent pain related to bone metastases. Considering hospice care. We discussed the benefits of hospice care and condolences offered.   Hx of CVA - Continue Plavix , Rosuvastatin .   DM2 - Continue to follow with PCP.         Dispo: follow up in 6 mos  Signed, Reche GORMAN Finder, NP

## 2023-09-13 NOTE — Telephone Encounter (Signed)
 Pt and wife called with questions about pain medication and other medications. Pt asked what medications he can stop, instructed per Levon, NP that pt can stop vitamins if he chose but should continue with most prescribed medications. Pt also asked about fentanyl  patches, which was previously discussed with Levon, NP. Education provided on how to use, verbalized understanding. Education also provided on labs and blood transfusion for the future.

## 2023-09-14 ENCOUNTER — Other Ambulatory Visit (HOSPITAL_COMMUNITY): Payer: Self-pay

## 2023-09-14 ENCOUNTER — Other Ambulatory Visit: Payer: Self-pay

## 2023-09-14 DIAGNOSIS — C61 Malignant neoplasm of prostate: Secondary | ICD-10-CM | POA: Diagnosis not present

## 2023-09-14 MED ORDER — FENTANYL 12 MCG/HR TD PT72
1.0000 | MEDICATED_PATCH | TRANSDERMAL | 0 refills | Status: DC
  Filled 2023-09-14: qty 10, 30d supply, fill #0

## 2023-09-14 NOTE — Telephone Encounter (Signed)
 Pt medication not in stock at preferred pharmacy, sent to [facility] per family request.

## 2023-09-15 ENCOUNTER — Inpatient Hospital Stay (HOSPITAL_BASED_OUTPATIENT_CLINIC_OR_DEPARTMENT_OTHER): Payer: Medicare HMO | Admitting: Nurse Practitioner

## 2023-09-15 ENCOUNTER — Encounter: Payer: Self-pay | Admitting: Nurse Practitioner

## 2023-09-15 ENCOUNTER — Inpatient Hospital Stay: Payer: Medicare HMO

## 2023-09-15 ENCOUNTER — Inpatient Hospital Stay: Payer: Medicare HMO | Admitting: Nurse Practitioner

## 2023-09-15 DIAGNOSIS — D630 Anemia in neoplastic disease: Secondary | ICD-10-CM | POA: Diagnosis not present

## 2023-09-15 DIAGNOSIS — C7951 Secondary malignant neoplasm of bone: Secondary | ICD-10-CM

## 2023-09-15 DIAGNOSIS — Z79891 Long term (current) use of opiate analgesic: Secondary | ICD-10-CM | POA: Diagnosis not present

## 2023-09-15 DIAGNOSIS — R530 Neoplastic (malignant) related fatigue: Secondary | ICD-10-CM

## 2023-09-15 DIAGNOSIS — C61 Malignant neoplasm of prostate: Secondary | ICD-10-CM

## 2023-09-15 DIAGNOSIS — Z515 Encounter for palliative care: Secondary | ICD-10-CM

## 2023-09-15 DIAGNOSIS — Z923 Personal history of irradiation: Secondary | ICD-10-CM | POA: Diagnosis not present

## 2023-09-15 DIAGNOSIS — G893 Neoplasm related pain (acute) (chronic): Secondary | ICD-10-CM

## 2023-09-15 DIAGNOSIS — R630 Anorexia: Secondary | ICD-10-CM | POA: Diagnosis not present

## 2023-09-15 DIAGNOSIS — Z7189 Other specified counseling: Secondary | ICD-10-CM

## 2023-09-15 DIAGNOSIS — Z79899 Other long term (current) drug therapy: Secondary | ICD-10-CM | POA: Diagnosis not present

## 2023-09-15 DIAGNOSIS — R5383 Other fatigue: Secondary | ICD-10-CM | POA: Diagnosis not present

## 2023-09-15 DIAGNOSIS — D649 Anemia, unspecified: Secondary | ICD-10-CM

## 2023-09-15 LAB — CBC WITH DIFFERENTIAL (CANCER CENTER ONLY)
Abs Immature Granulocytes: 0.07 10*3/uL (ref 0.00–0.07)
Basophils Absolute: 0 10*3/uL (ref 0.0–0.1)
Basophils Relative: 0 %
Eosinophils Absolute: 0 10*3/uL (ref 0.0–0.5)
Eosinophils Relative: 0 %
HCT: 25.7 % — ABNORMAL LOW (ref 39.0–52.0)
Hemoglobin: 8.4 g/dL — ABNORMAL LOW (ref 13.0–17.0)
Immature Granulocytes: 2 %
Lymphocytes Relative: 21 %
Lymphs Abs: 0.7 10*3/uL (ref 0.7–4.0)
MCH: 28.7 pg (ref 26.0–34.0)
MCHC: 32.7 g/dL (ref 30.0–36.0)
MCV: 87.7 fL (ref 80.0–100.0)
Monocytes Absolute: 0.4 10*3/uL (ref 0.1–1.0)
Monocytes Relative: 12 %
Neutro Abs: 2.2 10*3/uL (ref 1.7–7.7)
Neutrophils Relative %: 65 %
Platelet Count: 60 10*3/uL — ABNORMAL LOW (ref 150–400)
RBC: 2.93 MIL/uL — ABNORMAL LOW (ref 4.22–5.81)
RDW: 20.5 % — ABNORMAL HIGH (ref 11.5–15.5)
WBC Count: 3.4 10*3/uL — ABNORMAL LOW (ref 4.0–10.5)
nRBC: 0.9 % — ABNORMAL HIGH (ref 0.0–0.2)

## 2023-09-15 LAB — SAMPLE TO BLOOD BANK

## 2023-09-15 MED ORDER — OXYCODONE HCL 5 MG PO TABS
5.0000 mg | ORAL_TABLET | Freq: Once | ORAL | Status: AC
Start: 2023-09-15 — End: 2023-09-15
  Administered 2023-09-15: 5 mg via ORAL
  Filled 2023-09-15: qty 1

## 2023-09-15 NOTE — Progress Notes (Signed)
Per Dr [name], no transfusion needed today with hgb 8.4 and fatigue. Pt states he does not feel dizzy or short of breath.

## 2023-09-15 NOTE — Progress Notes (Signed)
Pt c/o severe pain during clinic appt, pt given 5mg  oxycodone (see MAR) per Lowella Bandy, NP. Pt tolerated well, d/c to lobby w/o complaints.

## 2023-09-15 NOTE — Progress Notes (Signed)
Palliative Medicine [facility]  [phone] [phone]   Name: Wayne Perkins Date: 09/15/2023 MRN: [id number]  DOB: 1941-04-08  Patient Care Team: Sheliah Hatch, MD as PCP - General (Family Medicine) Jodelle Red, MD as PCP - Cardiology (Cardiology) Cindie Laroche, MD as Referring Physician (Urology) Felicita Gage, RN Nurse Navigator as Registered Nurse (Medical Oncology) Malachy Mood, MD as Consulting Physician (Hematology and Oncology) Pickenpack-Cousar, Arty Baumgartner, NP as Nurse Practitioner (Hospice and Palliative Medicine)    INTERVAL HISTORY: Wayne Perkins is a 83 y.o. adult with  oncologic medical history including malignant neoplasm of prostate (07/2019) with metastatic disease to bone, as well as HLD, hypertension, CAD, CKD stage 3, type 2 diabetes, anemia, and a TIA in 2020.  Palliative ask to see for symptom management and goals of care.   SOCIAL HISTORY:     reports that he has never smoked. He has never used smokeless tobacco. He reports that he does not currently use alcohol. He reports that he does not use drugs.  ADVANCE DIRECTIVES:  None on file  CODE STATUS: Full code  PAST MEDICAL HISTORY: Past Medical History:  Diagnosis Date   Arthritis    Blood in stool    Childhood asthma    Diabetes mellitus without complication (HCC)    type II   Heart disease    Hypertension, essential    Prostate cancer (HCC)    Stroke (HCC)     ALLERGIES:  is allergic to tramadol, dexamethasone, glipizide, and statins.  MEDICATIONS:  Current Outpatient Medications  Medication Sig Dispense Refill   Alcohol Swabs (B-D SINGLE USE SWABS REGULAR) PADS      blood glucose meter kit and supplies Use once a day in the AM for fasting sugars. 1 each 0   calcium-vitamin D (OSCAL WITH D) 500-200 MG-UNIT tablet Take 1 tablet by mouth 2 (two) times daily. 60 tablet 3   Cholecalciferol (VITAMIN D3) 50 MCG (2000 UT) TABS Take  2,000 Units by mouth daily.     clopidogrel (PLAVIX) 75 MG tablet TAKE 1 TABLET EVERY DAY 90 tablet 3   dronabinol (MARINOL) 2.5 MG capsule Take 1 capsule (2.5 mg total) by mouth 2 (two) times daily before lunch and supper. (Patient not taking: Reported on 09/13/2023) 60 capsule 0   Dulaglutide (TRULICITY) 0.75 MG/0.5ML SOPN Inject 0.75 mg into the skin every Wednesday. 4 pen 0   DULoxetine (CYMBALTA) 30 MG capsule Take 1 capsule (30 mg total) by mouth daily. 30 capsule 0   Ez Smart Blood Glucose Lancets MISC by Does not apply route. 100 each 3   fentaNYL (DURAGESIC) 12 MCG/HR Place 1 patch onto the skin every 3 (three) days. 10 patch 0   glucose blood test strip Use as instructed 100 each 3   hydrOXYzine (ATARAX) 10 MG tablet TAKE 1 TABLET BY MOUTH 3 TIMES DAILY AS NEEDED FOR ITCHING. 270 tablet 1   metFORMIN (GLUCOPHAGE) 500 MG tablet Take 1 tablet (500 mg total) by mouth 2 (two) times daily with a meal. (Patient not taking: Reported on 09/13/2023) 180 tablet 1   Multiple Vitamin (MULTI-DAY VITAMINS PO) Take 1 tablet by mouth daily with breakfast.     NIFEdipine (PROCARDIA XL/NIFEDICAL-XL) 90 MG 24 hr tablet Take 1 tablet (90 mg total) by mouth daily. 90 tablet 3   nitroGLYCERIN (NITROSTAT) 0.4 MG SL tablet Place 1 tablet (0.4 mg total) under the tongue every 5 (five) minutes as needed for chest  pain. 25 tablet 3   oxyCODONE (OXY IR/ROXICODONE) 5 MG immediate release tablet Take 1 tablet (5 mg total) by mouth every 6 (six) hours as needed for severe pain (pain score 7-10). 90 tablet 0   polyethylene glycol (MIRALAX / GLYCOLAX) 17 g packet Take 8.5 g by mouth in the morning and at bedtime.     potassium chloride SA (KLOR-CON M) 20 MEQ tablet Take 1 tablet (20 mEq total) by mouth 2 (two) times daily. 180 tablet 3   rosuvastatin (CRESTOR) 5 MG tablet TAKE 1 TABLET EVERY DAY 90 tablet 3   TYLENOL 325 MG CAPS Take 325-650 mg by mouth every 6 (six) hours as needed (for pain).     vitamin B-12  (CYANOCOBALAMIN) 1000 MCG tablet Take 1,000 mcg by mouth daily.     No current facility-administered medications for this visit.    VITAL SIGNS: There were no vitals taken for this visit. There were no vitals filed for this visit.  Estimated body mass index is 17.38 kg/m as calculated from the following:   Height as of 09/13/23: 5\' 11"  (1.803 m).   Weight as of 09/13/23: 124 lb 9.6 oz (56.5 kg).     Latest Ref Rng & Units 09/08/2023    8:41 AM 09/02/2023    3:06 PM 08/26/2023   11:23 AM  CMP  Glucose 70 - 99 mg/dL 409  811  914   BUN 8 - 23 mg/dL 30  33  40   Creatinine 0.61 - 1.24 mg/dL 7.82  9.56  2.13   Sodium 135 - 145 mmol/L 139  138  138   Potassium 3.5 - 5.1 mmol/L 3.8  3.9  3.5   Chloride 98 - 111 mmol/L 103  102  102   CO2 22 - 32 mmol/L 24  24  24    Calcium 8.9 - 10.3 mg/dL 8.8  8.7  8.6   Total Protein 6.5 - 8.1 g/dL 6.6  6.5  6.6   Total Bilirubin 0.0 - 1.2 mg/dL 0.5  0.5  0.5   Alkaline Phos 38 - 126 U/L 236  251  207   AST 15 - 41 U/L 26  25  21    ALT 0 - 44 U/L 7  5  6      PERFORMANCE STATUS (ECOG) : 1 - Symptomatic but completely ambulatory   Physical Exam General: uncomfortable, grimacing, weak appearing  Cardiovascular: regular rate and rhythm Pulmonary: normal breathing pattern  Extremities: no edema, no joint deformities Skin: no rashes Neurological: AAO x4  Discussed the use of AI scribe software for clinical note transcription with the patient, who gave verbal consent to proceed.   IMPRESSION:  Wayne Perkins presents to clinic today for follow-up. He is having significant pain. Grimacing, tearful, and unable to get comfortable. His wife and son are present. His appetite is minimal. He reports a persistent feeling of nausea, which has led to a decreased appetite and difficulty eating. Despite this, he managed to consume a substantial lunch the previous day. However, his intake has since decreased, with only two bites of breakfast consumed on the day of the  consultation. Wayne Perkins reports that the patient spends most of his time in bed, often requiring assistance with dressing. The caregiver has expressed concern about the patient's deteriorating health and is considering next steps to promote comfort as family does not wish for patient to suffer.   The patient's mobility has also been affected, with a noted lack of strength in  his legs, making walking a struggle.  Given his level of distress due to his pain. RN to administer oxycodone and Tylenol in clinic for support. Mr. Merrihew reports it has become more challenging due to difficulty swallowing the pill to take certain medications. At time, he wishes to minimize medications. We reviewed his current medications at length and patient instructed what medications including vitamins to discontinue use with understanding of their conditions that are being considered.   Mr. Kerbel unfortunately has not been able to pick-up Fentanyl patches as prescribed earlier during the week. They plan to pick up today at [facility] and immediately apply. Education provided again on use. We discussed transitioning his oral oxycodone to a solution however he wishes to continue at this time with his pills.   I empathetically approached goals of care discussions. Family is emotional expressing patients ongoing decline with no signs of improvement and poor quality of life. At this they express mutual decision to focus solely on his comfort. Extensive education provided on hospice's goals and philosophy of care, what care would look like in the home for them, symptom management, and understanding Mr. Woodring would not be required to attend any further office visits from all providers. Family verbalized understanding confirming wishes to proceed with referral. Dr. [name] made aware and also present to discuss with patient and offer support.    MOST form introduced. DNR/DNI form completed and given to patient in addition  to completed MOST. The patient and family outlined their wishes for the following treatment decisions:  Cardiopulmonary Resuscitation: Do Not Attempt Resuscitation (DNR/No CPR)  Medical Interventions: Comfort Measures: Keep clean, warm, and dry. Use medication by any route, positioning, wound care, and other measures to relieve pain and suffering. Use oxygen, suction and manual treatment of airway obstruction as needed for comfort. Do not transfer to the hospital unless comfort needs cannot be met in current location.  Antibiotics: Determine use of limitation of antibiotics when infection occurs  IV Fluids: No IV fluids (provide other measures to ensure comfort)  Feeding Tube: No feeding tube     I discussed the importance of continued conversation with family and their medical providers regarding overall plan of care and treatment options, ensuring decisions are within the context of the patients values and GOCs.  Assessment and Plan  Cancer related Pain Difficulty with oral intake due to nausea and pain. Difficulty swallowing however wishes to continue with oral oxycodone in pill form. Fentanyl patch not yet started. -Start Fentanyl patch today, apply to clean, dry skin and change every three days. Confirmed prescription is available and ready at [facility] pharmacy.  -Continue Oxycodone every 3-4 hours as needed for breakthrough pain, consider taking with applesauce or pudding to aid swallowing. -Consider liquid form of Oxycodone if swallowing continues to be an issue.  General weakness Difficulty with mobility and self-care, requiring assistance for dressing and other activities of daily living. -Initiate hospice care for in-home support and symptom management.  Essential medications Patient currently on Procardia and Plavix. -Continue Procardia and Plavix, discontinue non-essential medications.  End-of-life care/Goals of Care Patient and spouse have discussed and decided on comfort  measures only. -Complete and sign Do Not Resuscitate (DNR) form, keep at home for reference. -MOST form completed as requested. -Referral placed for home hospice with [facility]   Follow-up -Expect a call from hospice to schedule an appointment for home visit. -Contact clinic as needed for additional support or concerns. No follow-ups needed at  this time.   Patient expressed understanding and was in agreement with this plan. He also understands that He can call the clinic at any time with any questions, concerns, or complaints.   Any controlled substances utilized were prescribed in the context of palliative care. PDMP has been reviewed.   Visit consisted of counseling and education dealing with the complex and emotionally intense issues of symptom management and palliative care in the setting of serious and potentially life-threatening illness.  Willette Alma, AGPCNP-BC  Palliative Medicine Team/[facility]

## 2023-09-16 ENCOUNTER — Encounter: Payer: Self-pay | Admitting: Hematology

## 2023-09-19 ENCOUNTER — Encounter: Payer: Self-pay | Admitting: Hematology

## 2023-09-20 LAB — GUARDANT 360

## 2023-09-28 ENCOUNTER — Encounter: Payer: Self-pay | Admitting: Hematology

## 2023-09-28 NOTE — Progress Notes (Signed)
Patient's spouse called to inform me patient is on Hospice now and to thank me for my assistance with the grant.  She has my card for any additional financial questions or concerns.

## 2023-09-29 ENCOUNTER — Other Ambulatory Visit: Payer: Medicare HMO

## 2023-10-01 DEATH — deceased

## 2023-10-13 ENCOUNTER — Other Ambulatory Visit: Payer: Medicare HMO

## 2023-10-13 ENCOUNTER — Ambulatory Visit: Payer: Medicare HMO | Admitting: Hematology

## 2023-10-27 ENCOUNTER — Other Ambulatory Visit: Payer: Medicare HMO

## 2023-10-27 ENCOUNTER — Ambulatory Visit: Payer: Medicare HMO | Admitting: Hematology
